# Patient Record
Sex: Female | Born: 1945
Health system: Southern US, Community
[De-identification: ages and names within clinical notes are randomized; demographics above are authoritative.]

## PROBLEM LIST (undated history)

## (undated) DIAGNOSIS — H547 Unspecified visual loss: Secondary | ICD-10-CM

## (undated) DIAGNOSIS — H409 Unspecified glaucoma: Secondary | ICD-10-CM

## (undated) DIAGNOSIS — M1990 Unspecified osteoarthritis, unspecified site: Secondary | ICD-10-CM

## (undated) DIAGNOSIS — F32A Depression, unspecified: Secondary | ICD-10-CM

## (undated) DIAGNOSIS — F319 Bipolar disorder, unspecified: Secondary | ICD-10-CM

## (undated) DIAGNOSIS — K219 Gastro-esophageal reflux disease without esophagitis: Secondary | ICD-10-CM

## (undated) DIAGNOSIS — F329 Major depressive disorder, single episode, unspecified: Secondary | ICD-10-CM

## (undated) DIAGNOSIS — G20 Parkinson's disease: Secondary | ICD-10-CM

## (undated) DIAGNOSIS — R413 Other amnesia: Secondary | ICD-10-CM

## (undated) DIAGNOSIS — S63659A Sprain of metacarpophalangeal joint of unspecified finger, initial encounter: Secondary | ICD-10-CM

## (undated) DIAGNOSIS — K449 Diaphragmatic hernia without obstruction or gangrene: Secondary | ICD-10-CM

## (undated) DIAGNOSIS — G20A1 Parkinson's disease without dyskinesia, without mention of fluctuations: Secondary | ICD-10-CM

## (undated) HISTORY — DX: Parkinson's disease: G20

## (undated) HISTORY — PX: TONSILLECTOMY: SUR1361

## (undated) HISTORY — DX: Parkinson's disease without dyskinesia, without mention of fluctuations: G20.A1

## (undated) HISTORY — PX: EYE SURGERY: SHX253

## (undated) HISTORY — PX: CYST REMOVAL NECK: SHX6281

## (undated) HISTORY — DX: Gastro-esophageal reflux disease without esophagitis: K21.9

## (undated) HISTORY — PX: ANKLE SURGERY: SHX546

## (undated) HISTORY — PX: FOOT NEUROMA SURGERY: SHX646

## (undated) HISTORY — DX: Unspecified glaucoma: H40.9

## (undated) HISTORY — DX: Diaphragmatic hernia without obstruction or gangrene: K44.9

## (undated) HISTORY — PX: CHOLECYSTECTOMY: SHX55

## (undated) HISTORY — DX: Unspecified visual loss: H54.7

---

## 1992-11-09 HISTORY — PX: CERVICAL DISC SURGERY: SHX588

## 2008-01-02 ENCOUNTER — Ambulatory Visit: Payer: Self-pay | Admitting: Family Medicine

## 2008-01-02 DIAGNOSIS — F329 Major depressive disorder, single episode, unspecified: Secondary | ICD-10-CM | POA: Insufficient documentation

## 2008-01-02 DIAGNOSIS — R51 Headache: Secondary | ICD-10-CM

## 2008-01-02 DIAGNOSIS — Z9189 Other specified personal risk factors, not elsewhere classified: Secondary | ICD-10-CM | POA: Insufficient documentation

## 2008-01-03 ENCOUNTER — Encounter: Payer: Self-pay | Admitting: Family Medicine

## 2008-08-03 ENCOUNTER — Telehealth: Payer: Self-pay | Admitting: Family Medicine

## 2008-08-19 ENCOUNTER — Emergency Department (HOSPITAL_COMMUNITY): Admission: EM | Admit: 2008-08-19 | Discharge: 2008-08-19 | Payer: Self-pay | Admitting: Emergency Medicine

## 2008-08-23 ENCOUNTER — Encounter: Payer: Self-pay | Admitting: Family Medicine

## 2008-09-06 ENCOUNTER — Encounter: Payer: Self-pay | Admitting: Family Medicine

## 2008-09-26 ENCOUNTER — Encounter: Payer: Self-pay | Admitting: Family Medicine

## 2008-11-19 ENCOUNTER — Ambulatory Visit: Payer: Self-pay | Admitting: Family Medicine

## 2008-11-19 DIAGNOSIS — R5381 Other malaise: Secondary | ICD-10-CM

## 2008-11-19 DIAGNOSIS — K219 Gastro-esophageal reflux disease without esophagitis: Secondary | ICD-10-CM

## 2008-11-19 DIAGNOSIS — R5383 Other fatigue: Secondary | ICD-10-CM

## 2008-11-20 ENCOUNTER — Encounter: Payer: Self-pay | Admitting: Family Medicine

## 2008-11-20 LAB — CONVERTED CEMR LAB
ALT: 23 units/L (ref 0–35)
AST: 22 units/L (ref 0–37)
Albumin: 3.8 g/dL (ref 3.5–5.2)
Alkaline Phosphatase: 65 units/L (ref 39–117)
BUN: 15 mg/dL (ref 6–23)
Basophils Absolute: 0 10*3/uL (ref 0.0–0.1)
Basophils Relative: 0.1 % (ref 0.0–3.0)
Bilirubin, Direct: 0.1 mg/dL (ref 0.0–0.3)
CO2: 34 meq/L — ABNORMAL HIGH (ref 19–32)
Calcium: 10.2 mg/dL (ref 8.4–10.5)
Chloride: 101 meq/L (ref 96–112)
Creatinine, Ser: 0.7 mg/dL (ref 0.4–1.2)
Eosinophils Absolute: 0.1 10*3/uL (ref 0.0–0.7)
Eosinophils Relative: 1.5 % (ref 0.0–5.0)
GFR calc Af Amer: 110 mL/min
GFR calc non Af Amer: 91 mL/min
Glucose, Bld: 108 mg/dL — ABNORMAL HIGH (ref 70–99)
HCT: 40.9 % (ref 36.0–46.0)
Hemoglobin: 13.9 g/dL (ref 12.0–15.0)
Lymphocytes Relative: 29.7 % (ref 12.0–46.0)
MCHC: 33.9 g/dL (ref 30.0–36.0)
MCV: 87.2 fL (ref 78.0–100.0)
Monocytes Absolute: 0.6 10*3/uL (ref 0.1–1.0)
Monocytes Relative: 6.4 % (ref 3.0–12.0)
Neutro Abs: 6 10*3/uL (ref 1.4–7.7)
Neutrophils Relative %: 62.3 % (ref 43.0–77.0)
Platelets: 314 10*3/uL (ref 150–400)
Potassium: 3.9 meq/L (ref 3.5–5.1)
RBC: 4.69 M/uL (ref 3.87–5.11)
RDW: 12.9 % (ref 11.5–14.6)
Sodium: 144 meq/L (ref 135–145)
TSH: 2.73 microintl units/mL (ref 0.35–5.50)
Total Bilirubin: 0.8 mg/dL (ref 0.3–1.2)
Total Protein: 7.1 g/dL (ref 6.0–8.3)
WBC: 9.5 10*3/uL (ref 4.5–10.5)

## 2008-12-10 ENCOUNTER — Telehealth: Payer: Self-pay | Admitting: Family Medicine

## 2008-12-28 ENCOUNTER — Encounter: Payer: Self-pay | Admitting: Family Medicine

## 2009-01-14 ENCOUNTER — Encounter: Admission: RE | Admit: 2009-01-14 | Discharge: 2009-03-14 | Payer: Self-pay | Admitting: Orthopedic Surgery

## 2009-06-25 ENCOUNTER — Ambulatory Visit: Payer: Self-pay | Admitting: Family Medicine

## 2009-06-25 DIAGNOSIS — R609 Edema, unspecified: Secondary | ICD-10-CM

## 2009-07-09 ENCOUNTER — Ambulatory Visit: Payer: Self-pay | Admitting: Family Medicine

## 2009-07-19 ENCOUNTER — Ambulatory Visit: Payer: Self-pay | Admitting: Family Medicine

## 2009-08-01 ENCOUNTER — Encounter: Payer: Self-pay | Admitting: Family Medicine

## 2009-08-15 ENCOUNTER — Encounter: Payer: Self-pay | Admitting: Family Medicine

## 2009-12-19 ENCOUNTER — Encounter: Payer: Self-pay | Admitting: Family Medicine

## 2010-03-04 ENCOUNTER — Telehealth: Payer: Self-pay | Admitting: Family Medicine

## 2010-06-05 ENCOUNTER — Emergency Department (HOSPITAL_COMMUNITY): Admission: EM | Admit: 2010-06-05 | Discharge: 2010-06-06 | Payer: Self-pay | Admitting: Emergency Medicine

## 2010-06-24 ENCOUNTER — Emergency Department (HOSPITAL_COMMUNITY): Admission: EM | Admit: 2010-06-24 | Discharge: 2010-06-24 | Payer: Self-pay | Admitting: Emergency Medicine

## 2010-06-26 ENCOUNTER — Telehealth: Payer: Self-pay | Admitting: Family Medicine

## 2010-07-04 ENCOUNTER — Observation Stay (HOSPITAL_COMMUNITY): Admission: RE | Admit: 2010-07-04 | Discharge: 2010-07-05 | Payer: Self-pay | Admitting: Orthopaedic Surgery

## 2010-07-04 HISTORY — PX: ORIF DISTAL RADIUS FRACTURE: SUR927

## 2010-07-31 ENCOUNTER — Ambulatory Visit: Payer: Self-pay | Admitting: Family Medicine

## 2010-07-31 DIAGNOSIS — R259 Unspecified abnormal involuntary movements: Secondary | ICD-10-CM | POA: Insufficient documentation

## 2010-07-31 DIAGNOSIS — B354 Tinea corporis: Secondary | ICD-10-CM | POA: Insufficient documentation

## 2010-07-31 DIAGNOSIS — L03119 Cellulitis of unspecified part of limb: Secondary | ICD-10-CM

## 2010-07-31 DIAGNOSIS — L02419 Cutaneous abscess of limb, unspecified: Secondary | ICD-10-CM | POA: Insufficient documentation

## 2010-09-08 ENCOUNTER — Inpatient Hospital Stay (HOSPITAL_COMMUNITY): Admission: EM | Admit: 2010-09-08 | Discharge: 2010-09-11 | Payer: Self-pay | Admitting: Emergency Medicine

## 2010-09-29 ENCOUNTER — Encounter: Payer: Self-pay | Admitting: Family Medicine

## 2010-10-25 ENCOUNTER — Encounter: Payer: Self-pay | Admitting: Family Medicine

## 2010-11-07 ENCOUNTER — Encounter: Payer: Self-pay | Admitting: Family Medicine

## 2010-11-13 ENCOUNTER — Ambulatory Visit
Admission: RE | Admit: 2010-11-13 | Discharge: 2010-11-13 | Payer: Self-pay | Source: Home / Self Care | Attending: Family Medicine | Admitting: Family Medicine

## 2010-11-13 DIAGNOSIS — G20 Parkinson's disease: Secondary | ICD-10-CM | POA: Insufficient documentation

## 2010-11-13 DIAGNOSIS — F068 Other specified mental disorders due to known physiological condition: Secondary | ICD-10-CM | POA: Insufficient documentation

## 2010-11-13 DIAGNOSIS — G20A1 Parkinson's disease without dyskinesia, without mention of fluctuations: Secondary | ICD-10-CM | POA: Insufficient documentation

## 2010-11-21 ENCOUNTER — Emergency Department (HOSPITAL_COMMUNITY)
Admission: EM | Admit: 2010-11-21 | Discharge: 2010-11-21 | Payer: Self-pay | Source: Home / Self Care | Admitting: Emergency Medicine

## 2010-11-24 LAB — CBC
HCT: 44.4 % (ref 36.0–46.0)
Hemoglobin: 14.9 g/dL (ref 12.0–15.0)
MCH: 30.3 pg (ref 26.0–34.0)
MCHC: 33.6 g/dL (ref 30.0–36.0)
MCV: 90.2 fL (ref 78.0–100.0)
Platelets: 257 10*3/uL (ref 150–400)
RBC: 4.92 MIL/uL (ref 3.87–5.11)
RDW: 13.2 % (ref 11.5–15.5)
WBC: 11.5 10*3/uL — ABNORMAL HIGH (ref 4.0–10.5)

## 2010-11-24 LAB — DIFFERENTIAL
Basophils Absolute: 0 10*3/uL (ref 0.0–0.1)
Basophils Relative: 0 % (ref 0–1)
Eosinophils Absolute: 0 10*3/uL (ref 0.0–0.7)
Eosinophils Relative: 0 % (ref 0–5)
Lymphocytes Relative: 9 % — ABNORMAL LOW (ref 12–46)
Lymphs Abs: 1 10*3/uL (ref 0.7–4.0)
Monocytes Absolute: 0.6 10*3/uL (ref 0.1–1.0)
Monocytes Relative: 5 % (ref 3–12)
Neutro Abs: 9.9 10*3/uL — ABNORMAL HIGH (ref 1.7–7.7)
Neutrophils Relative %: 86 % — ABNORMAL HIGH (ref 43–77)

## 2010-11-24 LAB — POCT I-STAT, CHEM 8
BUN: 8 mg/dL (ref 6–23)
Calcium, Ion: 1.1 mmol/L — ABNORMAL LOW (ref 1.12–1.32)
Chloride: 109 mEq/L (ref 96–112)
Creatinine, Ser: 0.7 mg/dL (ref 0.4–1.2)
Glucose, Bld: 121 mg/dL — ABNORMAL HIGH (ref 70–99)
HCT: 46 % (ref 36.0–46.0)
Hemoglobin: 15.6 g/dL — ABNORMAL HIGH (ref 12.0–15.0)
Potassium: 3.9 mEq/L (ref 3.5–5.1)
Sodium: 143 mEq/L (ref 135–145)
TCO2: 25 mmol/L (ref 0–100)

## 2010-11-25 ENCOUNTER — Encounter: Payer: Self-pay | Admitting: Family Medicine

## 2010-11-26 ENCOUNTER — Ambulatory Visit
Admission: RE | Admit: 2010-11-26 | Discharge: 2010-11-26 | Payer: Self-pay | Source: Home / Self Care | Attending: Family Medicine | Admitting: Family Medicine

## 2010-11-26 DIAGNOSIS — S32009A Unspecified fracture of unspecified lumbar vertebra, initial encounter for closed fracture: Secondary | ICD-10-CM | POA: Insufficient documentation

## 2010-12-09 NOTE — Assessment & Plan Note (Signed)
Summary: dizziness/lightheadiness//ccm   Vital Signs:  Patient profile:   65 year old female Temp:     97.8 degrees F BP sitting:   140 / 80  (left arm) Cuff size:   large  Vitals Entered By: Pura Spice, RN (July 31, 2010 11:06 AM) CC: states been complaining to Dr Tomasa Rand for tremors was told by him "due to your advanced age" . Fx right wrist aug 2011. tx by Dr Rayburn Ma.   Shingles bilaterally legs    Contraindications/Deferment of Procedures/Staging:    Test/Procedure: Weight Refused    Reason for deferment: patient declined-cannot calculate BMI   History of Present Illness: here with her daughter for 2 reasons. First she has had slowly worsening tremors and other involuntary movements of her arms and legs for the past year. Now it is so bad that she has trouble walking and loses her balance. She recently fell and broke a wrist because of this. She has been on Risperdal for the past year per Dr. Tomasa Rand, and they wonder if these could side effects. Dr. Tomasa Rand told her it was simply a part of her aging. Also, for the past 2 weeks she has had an itching rash on the insidesof both knees. No pain. It is red and blisters up from time to time.   Allergies: 1)  ! Penicillin 2)  ! * Celebrex  Past History:  Past Medical History: Reviewed history from 07/19/2009 and no changes required. Chickenpox Depression, sees Dr. Tomasa Rand Headache GERD osteoarthritis, sees Dr. Lestine Box peripheral edema  Past Surgical History: Tonsillectomy Caesarean section Lumpectomy from neck Disk fusion at C-6 in 1994 [facility] neuroma surgery to repair right wrist fracture 06-24-10 per Dr. Rayburn Ma  Review of Systems  The patient denies anorexia, fever, weight loss, weight gain, vision loss, decreased hearing, hoarseness, chest pain, syncope, dyspnea on exertion, peripheral edema, prolonged cough, headaches, hemoptysis, abdominal pain, melena, hematochezia, severe  indigestion/heartburn, hematuria, incontinence, genital sores, muscle weakness, suspicious skin lesions, transient blindness, depression, unusual weight change, abnormal bleeding, enlarged lymph nodes, angioedema, breast masses, and testicular masses.    Physical Exam  General:  Well-developed,well-nourished,in no acute distress; alert,appropriate and cooperative throughout examination Lungs:  Normal respiratory effort, chest expands symmetrically. Lungs are clear to auscultation, no crackles or wheezes. Heart:  Normal rate and regular rhythm. S1 and S2 normal without gallop, murmur, click, rub or other extra sounds. Neurologic:  alert & oriented X3 and cranial nerves II-XII intact.  Gait is unsteady. She has resting tremors of the arms and hands, and to some extent of the legs. She also has what appears to be some pill rolling movements of the hands and some constant hand and foot movements that are consistent with akisthesias.  Skin:  the medial sides of both thighs and knees have macular erythematous areas with some satellite red spots on the periphery. No papules or vessicles.    Impression & Recommendations:  Problem # 1:  RESTING TREMOR (ICD-781.0)  Orders: Neurology Referral (Neuro)  Problem # 2:  TINEA CORPORIS (ICD-110.5)  Problem # 3:  CELLULITIS, LEGS (ICD-682.6)  Her updated medication list for this problem includes:    Keflex 500 Mg Caps (Cephalexin) .Marland Kitchen... Three times a day  Complete Medication List: 1)  Wellbutrin Xl 300 Mg Xr24h-tab (Bupropion hcl) .... One daily 2)  Risperdal M-tab 2 Mg Tbdp (Risperidone) .... One daily 3)  Nexium 40 Mg Cpdr (Esomeprazole magnesium) .... Once daily 4)  Lamictal 150 Mg Tabs (Lamotrigine) .... 2 once daily  5)  Hydrochlorothiazide 25 Mg Tabs (Hydrochlorothiazide) .... Once daily 6)  Lunesta 2 Mg Tabs (Eszopiclone) 7)  Oxycodone Hcl 10 Mg Tabs (Oxycodone hcl) .... Dr blackmon 8)  Benztropine Mesylate 1 Mg Tabs (Benztropine mesylate) 9)   Keflex 500 Mg Caps (Cephalexin) .... Three times a day 10)  Ketoconazole 2 % Crea (Ketoconazole) .... Apply three times a day as needed rash  Patient Instructions: 1)  She has Candidiasis over the legs with a superimposed cellulitis. treat with antifungal cream and Keflex. Will refer her to Neurology for the arm and leg movements. These could be caused, at least in part, by her antipsychotic meds.  Prescriptions: KETOCONAZOLE 2 % CREA (KETOCONAZOLE) apply three times a day as needed rash  #60 x 5   Entered and Authorized by:   [name] MD   Signed by:   [name] MD on 07/31/2010   Method used:   Electronically to        [facility] # [phone]* (retail)       [address]       [place], [place]  [address]       Ph: [phone]       Fax: [phone]   RxID:   [id number] KEFLEX 500 MG CAPS (CEPHALEXIN) three times a day  #30 x 0   Entered and Authorized by:   [name] MD   Signed by:   [name] MD on 07/31/2010   Method used:   Electronically to        [facility] # [phone]* (retail)       [address]       [place], [place]  [address]       Ph: [phone]       Fax: [phone]   RxID:   [id number]

## 2010-12-09 NOTE — Letter (Signed)
Summary: Application for Handicapped Placard  Application for Handicapped Placard   Imported By: Maryln Gottron 08/05/2010 10:07:11  _____________________________________________________________________  External Attachment:    Type:   Image     Comment:   External Document

## 2010-12-09 NOTE — Consult Note (Signed)
Summary: [facility]  [facility]   Imported By: Maryln Gottron 10/01/2010 12:53:38  _____________________________________________________________________  External Attachment:    Type:   Image     Comment:   External Document

## 2010-12-09 NOTE — Progress Notes (Signed)
Summary: Pt req script for Nexium via [facility] and to [facility] Note Call from Patient Call back at [facility] Phone [phone]   Caller: Patient Summary of Call: Pt req refill of Nexium  90days with 3 refills through [facility] mail order pharmacy. Pt will pick up script for mail order.   Pt also req 30 day supply to [facility] on [address] [phone]. Initial call taken by: Lucy Antigua,  March 04, 2010 4:29 PM    Prescriptions: NEXIUM 40 MG CPDR (ESOMEPRAZOLE MAGNESIUM) once daily  #90 x 3   Entered by:   Raechel Ache, RN   Authorized by:   [name] MD   Signed by:   Raechel Ache, RN on 03/04/2010   Method used:   Print then Give to Patient   RxID:   [id number] NEXIUM 40 MG CPDR (ESOMEPRAZOLE MAGNESIUM) once daily  #30 x 1   Entered by:   Raechel Ache, RN   Authorized by:   [name] MD   Signed by:   Raechel Ache, RN on 03/04/2010   Method used:   Electronically to        [facility] # [phone]* (retail)       [address]       [place], [place]  [address]       Ph: [phone]       Fax: [phone]   RxID:   [id number]

## 2010-12-09 NOTE — Progress Notes (Signed)
Summary: REQ FOR RECOMMENDATION  Phone Note Call from Patient   Caller: Daughter Angelique Blonder)  [phone]  /  [phone] Summary of Call: Pts daughter called to pt fell and broke her wrist on 8/16..... Went to the ED and was evaluated, they referred her to [facility] but they will not see pt till outstanding balance is paid..... Pts daughter wants to see who Dr Clent Ridges would recommend for orthopedic consult for pt.....?  Initial call taken by: Debbra Riding,  June 26, 2010 12:14 PM  Follow-up for Phone Call        see [facility]. They should be able to see her today. We can do a referral if needed. Follow-up by: [name] MD,  June 27, 2010 8:44 AM  Additional Follow-up for Phone Call Additional follow up Details #1::        Called and spoke with pts daughter Angelique Blonder) to adv same.  Additional Follow-up by: Debbra Riding,  June 27, 2010 9:36 AM

## 2010-12-11 NOTE — Miscellaneous (Signed)
Summary: Certification and Plan of Care/[facility]  Certification and Plan of Care/[facility]   Imported By: Maryln Gottron 11/12/2010 13:07:54  _____________________________________________________________________  External Attachment:    Type:   Image     Comment:   External Document

## 2010-12-11 NOTE — Letter (Signed)
Summary: Records from [facility] 2011  Records from [facility] 2011   Imported By: Maryln Gottron 11/18/2010 09:13:15  _____________________________________________________________________  External Attachment:    Type:   Image     Comment:   External Document

## 2010-12-11 NOTE — Assessment & Plan Note (Signed)
Summary: er fup on fall//ccm pt rsc/njr   Vital Signs:  Patient profile:   65 year old female Temp:     98.6 degrees F Pulse rate:   74 / minute BP sitting:   114 / 76  (left arm) Cuff size:   regular  Vitals Entered By: Pura Spice, RN (November 26, 2010 11:08 AM) CC: follow-up visit ER [facility] for fall  . Daughter states she will not eat or drink and "clinches her teeth "  when trying to be fed.  Neurologist  Dr Vedia [facility]    History of Present Illness: Here to follow up after an ER visit on 11-21-10 for a fall which occurred at home. CT of head and CT of cervcial spines were normal. CT of lumbar spine revealed a small acute L1 compression fracture. Since then she has been in constant pain in the lower back. She is in a wheelchair and can no longer walk due to weakness, instabiity, and back pain. Her daughter relates that her memory is rapidly getting worse, she gets very confused, and she is now refusing to eat any food and is drinking only small amounts. Her tremors are worse. She had been having some home visits by PTs and OTs, but these were stopped when her insurance stopped paying for it. Now her daughter is at her wits end because she needs to return to work at her own job but Jaycelynn cannot be left alone at home.   Allergies: 1)  ! Penicillin 2)  ! * Celebrex  Past History:  Past Medical History: Reviewed history from 11/13/2010 and no changes required. Chickenpox Bipolar Depression, sees Dr. Tomasa Rand Headache GERD osteoarthritis, sees Dr. Lestine Box peripheral edema Parkinsonian tremors and dementia, sees Dr. Joycelyn Schmid  Past Surgical History: Reviewed history from 07/31/2010 and no changes required. Tonsillectomy Caesarean section Lumpectomy from neck Disk fusion at C-6 in 1994 [facility] neuroma surgery to repair right wrist fracture 06-24-10 per Dr. Rayburn Ma  Review of Systems  The patient denies anorexia, fever, weight loss, weight  gain, vision loss, decreased hearing, hoarseness, chest pain, syncope, dyspnea on exertion, peripheral edema, prolonged cough, headaches, hemoptysis, abdominal pain, melena, hematochezia, severe indigestion/heartburn, hematuria, incontinence, genital sores, muscle weakness, suspicious skin lesions, transient blindness, depression, unusual weight change, abnormal bleeding, enlarged lymph nodes, angioedema, breast masses, and testicular masses.    Physical Exam  General:  in a wheelchair, alert but disoriented Lungs:  Normal respiratory effort, chest expands symmetrically. Lungs are clear to auscultation, no crackles or wheezes. Heart:  Normal rate and regular rhythm. S1 and S2 normal without gallop, murmur, click, rub or other extra sounds. Neurologic:  cranial nerves II-XII intact.  she has expressive aphasia and has great difficulty putting her thoughts into words. Resting tremors in the arms, legs, and head   Impression & Recommendations:  Problem # 1:  COMPRESSION FRACTURE, LUMBAR VERTEBRAE (ICD-805.4)  Problem # 2:  DEMENTIA (ICD-294.8)  Problem # 3:  PARKINSONISM (ICD-332.0)  Problem # 4:  WEAKNESS (ICD-780.79)  Complete Medication List: 1)  Wellbutrin Xl 300 Mg Xr24h-tab (Bupropion hcl) .... One daily 2)  Nexium 40 Mg Cpdr (Esomeprazole magnesium) .... Once daily 3)  Benztropine Mesylate 2 Mg Tabs (benztropine Mesylate)  .... Three times a day 4)  Lamotrigine 200 Mg Tabs (Lamotrigine) .... Once daily 5)  Aricept 10 Mg Tabs (Donepezil hcl) .... Once daily 6)  Depakote 500 Mg Tbec (Divalproex sodium) .... 1000mg  once daily  Patient Instructions: 1)  she  obviously needs inpatient care now, but her insurance is limiting what is available here in [name]. I have asked her daughter to drive her to [facility] ER for probable admission.   Orders Added: 1)  Est. Patient Level V [[address]]

## 2010-12-11 NOTE — Miscellaneous (Signed)
Summary: Certification and Plan of Care-Ammended/[facility]  Certification and Plan of Care-Ammended/[facility]   Imported By: Maryln Gottron 11/24/2010 12:34:03  _____________________________________________________________________  External Attachment:    Type:   Image     Comment:   External Document

## 2010-12-11 NOTE — Assessment & Plan Note (Signed)
Summary: POST REHAB F/U // RS   Vital Signs:  Patient profile:   65 year old female Height:      62 inches Weight:      174 pounds BMI:     31.94 Pulse rate:   70 / minute BP sitting:   118 / 86  (left arm)  Vitals Entered By: Jeremy Johann CMA (November 13, 2010 8:34 AM) CC: F/U rehab, refills, fasting   History of Present Illness: Here to follow up after a rehab stay at [facility] from 09-12-10 to 10-25-10 to receive PT and OT. Now she is back at home, and is having Home Health nursing visits twice a week. She is walking with a cane or walker, but she is no longer driving. She is due to see Dr. Marjory Lies sometime in the next few weeks , and she is to see Dr. Tomasa Rand again on 11-18-10. She is on Benztropine three times a day and Denepezil at bedtime for the tremors, and she is on Aricept 5 mg a day for dementia. She was felt to be having Parkinsonian symptoms like tremors, gait instability, and memory difficulties, and these are at least partly the result of taking long term antipsychotic meds. She says the tremors and memory issues are getting worse all the time. Here with her health aide.   Allergies (verified): 1)  ! Penicillin 2)  ! * Celebrex  Past History:  Past Medical History: Chickenpox Bipolar Depression, sees Dr. Tomasa Rand Headache GERD osteoarthritis, sees Dr. Lestine Box peripheral edema Parkinsonian tremors and dementia, sees Dr. Joycelyn Schmid  Past Surgical History: Reviewed history from 07/31/2010 and no changes required. Tonsillectomy Caesarean section Lumpectomy from neck Disk fusion at C-6 in 1994 [facility] neuroma surgery to repair right wrist fracture 06-24-10 per Dr. Rayburn Ma  Review of Systems  The patient denies anorexia, fever, weight loss, weight gain, vision loss, decreased hearing, hoarseness, chest pain, syncope, dyspnea on exertion, peripheral edema, prolonged cough, headaches, hemoptysis, abdominal pain, melena, hematochezia,  severe indigestion/heartburn, hematuria, incontinence, genital sores, muscle weakness, suspicious skin lesions, transient blindness, unusual weight change, abnormal bleeding, enlarged lymph nodes, angioedema, breast masses, and testicular masses.    Physical Exam  General:  alert, very unsteady gait, walks slowly with a shuffling gait, using a cane  Neck:  No deformities, masses, or tenderness noted. Lungs:  Normal respiratory effort, chest expands symmetrically. Lungs are clear to auscultation, no crackles or wheezes. Heart:  Normal rate and regular rhythm. S1 and S2 normal without gallop, murmur, click, rub or other extra sounds. Neurologic:  alert & oriented X3.  very pronounced resting tremors in the head, hands, arms, and legs. Her speech is halting, and she has trouble finding the right words to express herself. Often stops and forgets what she was talking about.  Psych:  Oriented X3, good eye contact, and moderately anxious.     Impression & Recommendations:  Problem # 1:  PARKINSONISM (ICD-332.0)  Problem # 2:  DEMENTIA (ICD-294.8)  Problem # 3:  DEPRESSION (ICD-311)  Her updated medication list for this problem includes:    Wellbutrin Xl 300 Mg Xr24h-tab (Bupropion hcl) ..... One daily  Complete Medication List: 1)  Wellbutrin Xl 300 Mg Xr24h-tab (Bupropion hcl) .... One daily 2)  Risperdal 1 Mg Tabs (Risperidone) .Marland Kitchen.. 1 tab at bedtime 3)  Nexium 40 Mg Cpdr (Esomeprazole magnesium) .... Once daily 4)  Benztropine Mesylate 1 Mg Tabs (Benztropine mesylate) .... Three times a day 5)  Donepezil Hcl 5 Mg  Tabs (Donepezil hcl) .Marland Kitchen.. 1 tab at bedtime 6)  Lamotrigine 200 Mg Tabs (Lamotrigine) .... 2 once daily 7)  Aricept 10 Mg Tabs (Donepezil hcl) .... Once daily  Patient Instructions: 1)  She has worsening Parkinsonian symptoms, and I think she could greatly benefit from taking Sinemet. Her demetia is probably Parkinsonian in nature, as well. I doubt the Aricept is doing much good,  but I will keep her on it for now, and we will increase the dose to 10 mg a day. Her symptoms are probably worsened by meds like the Risperdol she is on, but we will let her psychiatrist decide about whether or no to stop this. See Dr. Casimer Lanius and Dr. Tomasa Rand soon, and see me in one month.  Prescriptions: ARICEPT 10 MG TABS (DONEPEZIL HCL) once daily  #30 x 11   Entered and Authorized by:   [name] MD   Signed by:   [name] MD on 11/13/2010   Method used:   Electronically to        [facility]  [phone]* (retail)       [address] [place], [place]  [address]       Ph: [phone] or [phone]       Fax: [phone]   RxID:   [id number]    Orders Added: 1)  Est. Patient Level IV [36644]

## 2010-12-17 NOTE — Miscellaneous (Signed)
Summary: Medication Interactions/[facility]  Medication Interactions/[facility]   Imported By: Maryln Gottron 12/09/2010 14:52:09  _____________________________________________________________________  External Attachment:    Type:   Image     Comment:   External Document

## 2010-12-24 DIAGNOSIS — G20A1 Parkinson's disease without dyskinesia, without mention of fluctuations: Secondary | ICD-10-CM

## 2010-12-24 DIAGNOSIS — G20 Parkinson's disease: Secondary | ICD-10-CM

## 2010-12-24 DIAGNOSIS — R413 Other amnesia: Secondary | ICD-10-CM

## 2010-12-24 DIAGNOSIS — F319 Bipolar disorder, unspecified: Secondary | ICD-10-CM

## 2010-12-25 ENCOUNTER — Encounter: Payer: Self-pay | Admitting: Family Medicine

## 2010-12-26 ENCOUNTER — Ambulatory Visit (INDEPENDENT_AMBULATORY_CARE_PROVIDER_SITE_OTHER): Admitting: Family Medicine

## 2010-12-26 ENCOUNTER — Encounter: Payer: Self-pay | Admitting: Family Medicine

## 2010-12-26 VITALS — BP 130/80 | Temp 97.5°F | Wt 163.0 lb

## 2010-12-26 DIAGNOSIS — F329 Major depressive disorder, single episode, unspecified: Secondary | ICD-10-CM

## 2010-12-26 DIAGNOSIS — M810 Age-related osteoporosis without current pathological fracture: Secondary | ICD-10-CM

## 2010-12-26 DIAGNOSIS — F3289 Other specified depressive episodes: Secondary | ICD-10-CM

## 2010-12-26 DIAGNOSIS — G20 Parkinson's disease: Secondary | ICD-10-CM

## 2010-12-26 DIAGNOSIS — G20A1 Parkinson's disease without dyskinesia, without mention of fluctuations: Secondary | ICD-10-CM

## 2010-12-26 LAB — BASIC METABOLIC PANEL
BUN: 7 mg/dL (ref 6–23)
CO2: 26 mEq/L (ref 19–32)
Chloride: 107 mEq/L (ref 96–112)
Potassium: 4.1 mEq/L (ref 3.5–5.1)

## 2010-12-26 LAB — CBC WITH DIFFERENTIAL/PLATELET
Basophils Absolute: 0 10*3/uL (ref 0.0–0.1)
HCT: 40.9 % (ref 36.0–46.0)
Lymphs Abs: 2.9 10*3/uL (ref 0.7–4.0)
MCV: 90.8 fl (ref 78.0–100.0)
Monocytes Absolute: 0.4 10*3/uL (ref 0.1–1.0)
Neutrophils Relative %: 48 % (ref 43.0–77.0)
Platelets: 239 10*3/uL (ref 150.0–400.0)
RDW: 14.7 % — ABNORMAL HIGH (ref 11.5–14.6)

## 2010-12-26 LAB — HEPATIC FUNCTION PANEL
ALT: 24 U/L (ref 0–35)
AST: 18 U/L (ref 0–37)
Bilirubin, Direct: 0.2 mg/dL (ref 0.0–0.3)
Total Bilirubin: 1 mg/dL (ref 0.3–1.2)
Total Protein: 6.5 g/dL (ref 6.0–8.3)

## 2010-12-26 LAB — TSH: TSH: 2.32 u[IU]/mL (ref 0.35–5.50)

## 2010-12-26 LAB — VITAMIN B12: Vitamin B-12: 432 pg/mL (ref 211–911)

## 2010-12-26 NOTE — Progress Notes (Signed)
  Subjective:    Patient ID: Audrey Brown, female    DOB: [birth year], 65 y.o.   MRN: [id number]  HPI Here with her daughter Audrey Brown to follow up from a stay at [facility] from 11-20-10 to 12-04-10 for a lumbar compression fracture due to a fall, for Parkinsons disease, and for dementia. All her previous psychiatric meds from Dr. Tomasa Rand were stopped since they were contributing to her parkinsonian symptoms. She was treated at [facility] by a Dr. Zola Button, who is a Neurologist specializing in movement disorders. Was was started on Sinemet and Cogentin, and she has dramatically improved as far as her mental status, her memory, and her tremors. After her DC from [facility], she was transferred to [facility] for rehabilitation. She is set to return home from there in the next few weeks. She is seeing Dr. Marjory Lies now for Neurology care locally.    Review of Systems  Respiratory: Negative.   Cardiovascular: Negative.   Musculoskeletal: Positive for gait problem.  Neurological: Positive for tremors and weakness.  Psychiatric/Behavioral: Negative.        Objective:   Physical Exam  Constitutional: She is oriented to person, place, and time.       Seated in her wheelchair, alert  Cardiovascular: Normal rate, regular rhythm, normal heart sounds and intact distal pulses.  Exam reveals no gallop and no friction rub.   No murmur heard. Pulmonary/Chest: Effort normal and breath sounds normal. No respiratory distress. She has no wheezes. She has no rales. She exhibits no tenderness.  Neurological: She is alert and oriented to person, place, and time.       She has resting tremors of both hands and forearms, but these are much improved from her last visit here. No head or  truncal tremors          Assessment & Plan:  She is improving nicely after her medication changes. She is getting OT and PT at rehab.

## 2010-12-29 ENCOUNTER — Telehealth: Payer: Self-pay

## 2010-12-29 MED ORDER — VITAMIN D (ERGOCALCIFEROL) 1.25 MG (50000 UNIT) PO CAPS: 50000.0000 [IU] | ORAL_CAPSULE | ORAL | Status: DC

## 2010-12-29 NOTE — Telephone Encounter (Signed)
Message copied by Madison Hickman on Mon Dec 29, 2010 11:22 AM ------      Message from: Dwaine Deter      Created: Mon Dec 29, 2010  9:06 AM       Vitamin D is very low. Call in 50,000 unit capsules to take once a week for 12 weeks. Then check another level

## 2010-12-29 NOTE — Telephone Encounter (Signed)
Audrey Brown daughter notified.  Lab results

## 2010-12-29 NOTE — Telephone Encounter (Signed)
Daughter  Angelique Blonder aware and rx vit d called in

## 2010-12-29 NOTE — Telephone Encounter (Signed)
Message copied by Madison Hickman on Mon Dec 29, 2010 11:21 AM ------      Message from: Dwaine Deter      Created: Fri Dec 26, 2010  4:40 PM       Normal, although vitamin D is pending

## 2011-01-03 ENCOUNTER — Emergency Department (HOSPITAL_COMMUNITY)

## 2011-01-03 ENCOUNTER — Inpatient Hospital Stay (HOSPITAL_COMMUNITY)
Admission: EM | Admit: 2011-01-03 | Discharge: 2011-01-06 | DRG: 103 | Disposition: A | Discharge status: Home Health | Attending: Internal Medicine | Admitting: Internal Medicine

## 2011-01-03 DIAGNOSIS — Y849 Medical procedure, unspecified as the cause of abnormal reaction of the patient, or of later complication, without mention of misadventure at the time of the procedure: Secondary | ICD-10-CM | POA: Diagnosis present

## 2011-01-03 DIAGNOSIS — F0390 Unspecified dementia without behavioral disturbance: Secondary | ICD-10-CM | POA: Diagnosis present

## 2011-01-03 DIAGNOSIS — Y9301 Activity, walking, marching and hiking: Secondary | ICD-10-CM

## 2011-01-03 DIAGNOSIS — Z9181 History of falling: Secondary | ICD-10-CM

## 2011-01-03 DIAGNOSIS — F319 Bipolar disorder, unspecified: Secondary | ICD-10-CM | POA: Diagnosis present

## 2011-01-03 DIAGNOSIS — S32009A Unspecified fracture of unspecified lumbar vertebra, initial encounter for closed fracture: Secondary | ICD-10-CM | POA: Diagnosis present

## 2011-01-03 DIAGNOSIS — R112 Nausea with vomiting, unspecified: Secondary | ICD-10-CM | POA: Diagnosis present

## 2011-01-03 DIAGNOSIS — J111 Influenza due to unidentified influenza virus with other respiratory manifestations: Secondary | ICD-10-CM | POA: Diagnosis present

## 2011-01-03 DIAGNOSIS — G20A1 Parkinson's disease without dyskinesia, without mention of fluctuations: Secondary | ICD-10-CM | POA: Diagnosis present

## 2011-01-03 DIAGNOSIS — W010XXA Fall on same level from slipping, tripping and stumbling without subsequent striking against object, initial encounter: Secondary | ICD-10-CM | POA: Diagnosis present

## 2011-01-03 DIAGNOSIS — G20 Parkinson's disease: Secondary | ICD-10-CM | POA: Diagnosis present

## 2011-01-03 DIAGNOSIS — G971 Other reaction to spinal and lumbar puncture: Principal | ICD-10-CM | POA: Diagnosis present

## 2011-01-03 LAB — CBC
Hemoglobin: 14.2 g/dL (ref 12.0–15.0)
MCH: 30.9 pg (ref 26.0–34.0)
MCV: 90.6 fL (ref 78.0–100.0)
Platelets: 262 10*3/uL (ref 150–400)
RBC: 4.59 MIL/uL (ref 3.87–5.11)
WBC: 19.4 10*3/uL — ABNORMAL HIGH (ref 4.0–10.5)

## 2011-01-03 LAB — COMPREHENSIVE METABOLIC PANEL
ALT: 19 U/L (ref 0–35)
AST: 20 U/L (ref 0–37)
Calcium: 9.2 mg/dL (ref 8.4–10.5)
GFR calc Af Amer: >60 mL/min (ref >60)
Sodium: 139 mEq/L (ref 135–145)
Total Protein: 7 g/dL (ref 6.0–8.3)

## 2011-01-03 LAB — DIFFERENTIAL
Eosinophils Absolute: 0 10*3/uL (ref 0.0–0.7)
Lymphocytes Relative: 11 % — ABNORMAL LOW (ref 12–46)
Lymphs Abs: 2.1 10*3/uL (ref 0.7–4.0)
Monocytes Relative: 7 % (ref 3–12)
Neutro Abs: 16 10*3/uL — ABNORMAL HIGH (ref 1.7–7.7)
Neutrophils Relative %: 82 % — ABNORMAL HIGH (ref 43–77)

## 2011-01-03 LAB — URINALYSIS, ROUTINE W REFLEX MICROSCOPIC
Bilirubin Urine: NEGATIVE
Hgb urine dipstick: NEGATIVE
Urobilinogen, UA: 0.2 mg/dL (ref 0.0–1.0)

## 2011-01-03 LAB — URINE MICROSCOPIC-ADD ON

## 2011-01-03 MED ORDER — GADOBENATE DIMEGLUMINE 529 MG/ML IV SOLN
15.0000 mL | Freq: Once | INTRAVENOUS | Status: AC | PRN
  Administered 2011-01-03: 15 mL via INTRAVENOUS

## 2011-01-04 LAB — DIFFERENTIAL
Eosinophils Absolute: 0.1 10*3/uL (ref 0.0–0.7)
Eosinophils Relative: 1 % (ref 0–5)
Lymphs Abs: 2.2 10*3/uL (ref 0.7–4.0)
Monocytes Relative: 8 % (ref 3–12)
Neutrophils Relative %: 78 % — ABNORMAL HIGH (ref 43–77)

## 2011-01-04 LAB — CBC
HCT: 37.9 % (ref 36.0–46.0)
MCH: 30.1 pg (ref 26.0–34.0)
MCV: 91.3 fL (ref 78.0–100.0)
Platelets: 237 10*3/uL (ref 150–400)
RBC: 4.15 MIL/uL (ref 3.87–5.11)

## 2011-01-04 LAB — URINE CULTURE: Colony Count: >=100000

## 2011-01-04 NOTE — H&P (Signed)
Audrey Brown, Audrey Brown              ACCOUNT NO.:  [id number]  MEDICAL RECORD NO.:  [id number]           PATIENT TYPE:  I  LOCATION:  1504                         FACILITY:  [facility]  PHYSICIAN:  Mariea Stable, MD   DATE OF BIRTH:  September 22, 1946  DATE OF ADMISSION:  01/03/2011 DATE OF DISCHARGE:                             HISTORY & PHYSICAL   PRIMARY CARE PHYSICIAN:  Tera Mater. Clent Ridges, MD.  PSYCHIATRIST:  Tiajuana Amass, MD  The patient is following at [facility] as well with Neurology.  CHIEF COMPLAINT:  Nausea with vomiting and headache for a few days.  HISTORY OF PRESENT ILLNESS:  Audrey Brown is a 65 year old woman with past medical history significant for Parkinson disease, dementia, bipolar disorder and recurrent falls who presents with chief complaint stated above.  The patient had apparently been evaluated at [facility] for these recurrent falls and Parkinson disease thought to be secondary to medication.  She apparently had a spinal tap done on Wednesday, December 31, 2010, for further evaluation of this. Apparently on Thursday, the patient began having a constellation of symptoms that included mild headache, some nausea and vomiting along with a sore throat that has been getting progressively worse and some diffuse myalgias.  Apparently these have all been worse over the last few days.  The daughter who is at bedside states that the patient was not able to keep anything down on Thursday and then got a little better Friday tolerating a bit of p.o. intake.  However, today, the patient is back to not tolerating anything p.o.  She also apparently was noted to have a fever of 102 by the home health nurse today.  Currently, the patient complains of frontal headache without any neck stiffness or photophobia at this time.  Emesis has consisted of ingested food products and no bilious or bloody contents noted.  Furthermore, the patient denies any dysuria or frequency, change in  color or odor.  She also denies any chest pain or cough.  Furthermore, she denies any diarrhea.  The patient also denies any back pain at the site of the lumbar puncture.  PAST MEDICAL HISTORY: 1. Osteoarthritis. 2. GERD. 3. Dementia. 4. Parkinson disease. 5. Bipolar. 6. Recurrent falls.  MEDICATIONS: 1. Sinemet 25/100 mg p.o. t.i.d. 2. Flexeril 5 mg p.o. t.i.d. p.r.n. pain. 3. Bupropion 300 mg p.o. daily. 4. Aricept 10 mg p.o. daily. 5. Nexium 40 mg p.o. daily. 6. Vicodin p.o. every 6 hours p.r.n. pain. 7. Lamictal 200 mg p.o. daily. 8. Potassium chloride 10 mEq p.o. daily.  ALLERGIES:  PENICILLIN, which causes vomiting and CELEBREX, which causes hives.  SOCIAL HISTORY:  The patient lives with her daughter currently.  She is a nonsmoker, nondrinker and no illicit drug use.  Daughter, phone number [phone] is healthcare power of attorney.  REVIEW OF SYSTEMS:  As per HPI.  All other systems reviewed are negative.  FAMILY HISTORY:  Without any significant medical problems reported.  PHYSICAL EXAMINATION:  VITAL SIGNS:  Temperature 101.2, currently 98.7, heart rate of 104, currently 92, blood pressure 135/69 and stable, respirations 21 and oxygen saturation 96% on room air. GENERAL:  This is an elderly woman lying in bed who appears uncomfortable but in no acute distress. HEENT:  Head is normocephalic and atraumatic.  Pupils are equally round and reactive to light.  Extraocular movements are intact.  Sclerae anicteric.  Mucous membranes are moist without any oropharyngeal lesions. NECK:  Supple.  There is some mild tenderness in the anterior neck to palpation.  There is no thyromegaly.  There are no carotid bruits. There is no JVD. LUNGS:  Clear to auscultation bilaterally with good air movement. HEART:  There is normal S1 and S2 with regular rate and rhythm.  No murmurs, gallops or rubs. ABDOMEN:  Normal bowel sounds, soft, nontender, nondistended.  No guarding or  rebound. EXTREMITIES:  There is no edema. NEUROLOGIC:  The patient is awake, alert and oriented x3.  Cranial nerves appear grossly intact.  Motor is intact and sensation is intact. MUSCULOSKELETAL:  The patient is able to touch chin to chest.  There is mild tenderness upon palpation over the spine that is nonfocal.  There is a small bruised area at the site of the lumbar puncture, but there is no erythema or drainage from this site.  LABORATORY DATA:  A urinalysis shows a small amount of leukocyte esterase with 0-2 white blood cells on microscopy, WBC 19.4, hemoglobin 14.2, platelets 262 with an ANC of 16.  Sodium 139, potassium 3.9, chloride 104, bicarb 26, glucose 117, BUN 6, creatinine 0.66, total bilirubin 1.2, alkaline phosphatase 117, AST 20, ALT 19, total protein 7, albumin 3.7, calcium 9.2, rapid strep negative.  IMAGING: 1. Chest x-ray.  Impression:  No active cardiopulmonary disease. 2. MRI of L-spine with and without contrast.  Impression:  Acute or     subacute superior endplate compression fracture at L1.  No neural     compression.  This appears to be a benign type fracture.  Since the     previous CT scan, there may be slight/minimal further collapse at     the L1 superior endplate.  No complications seen relative to recent     lumbar puncture.  ASSESSMENT/PLAN: 1. Headache, nausea, vomiting, and sore throat.  This is currently of     unclear etiology, but given the constellation of symptoms, presume     that this is viral etiology and potentially flu.  We will go ahead     and order influenza PCR and start empiric treatment with Tamiflu.     The patient does have significant leukocytosis that is     predominately composed of neutrophils at 82% with an ANC of 16.     However, chest x-ray does not demonstrate any infiltrates and the     patient does not have any respiratory complaints per se aside from     the sore throat.  Furthermore, she has no urinary complaints  and     has a clean urinalysis and does not have CVA tenderness on exam.     Furthermore, the patient has nausea and vomiting, but she has got     no abdominal pain and abdominal exam is normal.  Furthermore, her     electrolytes are all within normal limits as are her liver function     tests.  Again, with no abdominal pain whatsoever and no tenderness     on exam, we will not pursue checking a lipase as this clinically is     not pancreatitis.  Furthermore, I do not see any infectious     etiology in  the abdomen as a likely etiology.  Again, I think the     most likely thing here is a viral syndrome, which may potentially     include fluid and the patient had her immunizations last fall.     Furthermore, the patient did have the lumbar puncture, however,     there are no significant findings on exam at the site of the lumbar     puncture and MRI obtained in the emergency department demonstrates     that there are no complications such as an epidural abscess.     Furthermore, this does not appear to be a bacterial meningitis with     symptoms now ongoing for day #3.  The patient is also not toxic     appearing, which would make this highly unlikely.  If there were     any component to meningitis, this would be an aseptic meningitis    that would not require treatment at this point.  Furthermore, the     patient has no meningismus on exam.  As mentioned above, we will go     ahead and treat empirically for flu and check a PCR.  We will also     hydrate and provide antiemetics.  We will start with a clear liquid     diet and advance as tolerated. 2. Superior endplate compression fracture at L1.  Per records from     [facility], this was secondary to a fall that the patient had     a few weeks ago.  This was noted by the physicians and the patient     had a neurosurgical consultation, although the patient was not     deemed to be a candidate.  Furthermore, they recommended possible      vertebroplasty for pain management, although the patient was not     having significant pain by that time and this was deferred.  At     this point, the patient should probably get a bone densitometry     scan to evaluate for osteoporosis as an outpatient if this has not     been done. 3. Parkinson's.  Again, this was thought to be secondary to     medication, which have been held without improvement per records.     We will continue her current regimen. 4. Bipolar disorder.  We will continue the patient's Lamictal and     bupropion.  Of note, her Depakote has been discontinued in the     recent past. 5. Dementia.  Again, this may be related to Parkinson's versus another     etiology.  I will go ahead and continue the patient's Aricept at     home dose.     Mariea Stable, MD     MA/MEDQ  D:  01/03/2011  T:  01/03/2011  Job:  [id number]  cc:   Jeannett Senior A. Clent Ridges, MD [address] [place] [address]  Tiajuana Amass, MD  Electronically Signed by Mariea Stable MD on 01/04/2011 05:50:24 PM

## 2011-01-05 LAB — BASIC METABOLIC PANEL
CO2: 27 mEq/L (ref 19–32)
Chloride: 107 mEq/L (ref 96–112)
GFR calc Af Amer: >60 mL/min (ref >60)
Potassium: 3.2 mEq/L — ABNORMAL LOW (ref 3.5–5.1)
Sodium: 141 mEq/L (ref 135–145)

## 2011-01-05 LAB — CBC
Hemoglobin: 12.6 g/dL (ref 12.0–15.0)
MCH: 29.7 pg (ref 26.0–34.0)
Platelets: 249 10*3/uL (ref 150–400)
RBC: 4.24 MIL/uL (ref 3.87–5.11)
WBC: 12.1 10*3/uL — ABNORMAL HIGH (ref 4.0–10.5)

## 2011-01-05 LAB — DIFFERENTIAL
Basophils Absolute: 0 10*3/uL (ref 0.0–0.1)
Basophils Relative: 0 % (ref 0–1)
Monocytes Relative: 9 % (ref 3–12)
Neutro Abs: 7.7 10*3/uL (ref 1.7–7.7)
Neutrophils Relative %: 64 % (ref 43–77)

## 2011-01-06 LAB — DIFFERENTIAL
Lymphocytes Relative: 39 % (ref 12–46)
Monocytes Absolute: 0.7 10*3/uL (ref 0.1–1.0)
Monocytes Relative: 10 % (ref 3–12)
Neutro Abs: 3.5 10*3/uL (ref 1.7–7.7)
Neutrophils Relative %: 48 % (ref 43–77)

## 2011-01-06 LAB — BASIC METABOLIC PANEL
CO2: 26 mEq/L (ref 19–32)
Calcium: 9 mg/dL (ref 8.4–10.5)
Chloride: 109 mEq/L (ref 96–112)
Creatinine, Ser: 0.51 mg/dL (ref 0.4–1.2)
Glucose, Bld: 93 mg/dL (ref 70–99)
Sodium: 141 mEq/L (ref 135–145)

## 2011-01-06 LAB — CBC
HCT: 38.8 % (ref 36.0–46.0)
Hemoglobin: 12.9 g/dL (ref 12.0–15.0)
MCH: 29.9 pg (ref 26.0–34.0)
MCHC: 33.2 g/dL (ref 30.0–36.0)
RBC: 4.32 MIL/uL (ref 3.87–5.11)

## 2011-01-09 LAB — CULTURE, BLOOD (ROUTINE X 2)
Culture  Setup Time: 201202252022
Culture: NO GROWTH

## 2011-01-14 ENCOUNTER — Encounter: Payer: Self-pay | Admitting: Family Medicine

## 2011-01-14 ENCOUNTER — Ambulatory Visit (INDEPENDENT_AMBULATORY_CARE_PROVIDER_SITE_OTHER): Admitting: Family Medicine

## 2011-01-14 DIAGNOSIS — F319 Bipolar disorder, unspecified: Secondary | ICD-10-CM

## 2011-01-14 DIAGNOSIS — G20A1 Parkinson's disease without dyskinesia, without mention of fluctuations: Secondary | ICD-10-CM

## 2011-01-14 DIAGNOSIS — G20 Parkinson's disease: Secondary | ICD-10-CM

## 2011-01-14 MED ORDER — ESOMEPRAZOLE MAGNESIUM 40 MG PO CPDR: 40.0000 mg | DELAYED_RELEASE_CAPSULE | Freq: Every day | ORAL | Status: DC

## 2011-01-14 MED ORDER — BUPROPION HCL ER (XL) 300 MG PO TB24: 300.0000 mg | ORAL_TABLET | Freq: Every day | ORAL | Status: DC

## 2011-01-14 MED ORDER — IBUPROFEN 800 MG PO TABS: 800.0000 mg | ORAL_TABLET | Freq: Three times a day (TID) | ORAL | Status: AC | PRN

## 2011-01-14 MED ORDER — LAMOTRIGINE ER 200 MG PO TB24: 1.0000 | ORAL_TABLET | Freq: Every day | ORAL | Status: DC

## 2011-01-14 NOTE — Progress Notes (Signed)
  Subjective:    Patient ID: Audrey Brown, female    DOB: 04-24-1946, 65 y.o.   MRN: [id number]  HPI Here to follow up after a hospital stay from 01-03-11 to 01-06-11 at [facility] for nausea, vomiting, and a HA which was probably the result of a lumbar puncture she had undergone a few days prior to this admission. She did have a fever but no cough, and there is a possibility that she had influenza. At any rate she improved quickly and was able to go home. Now she is to start PT and OT at home. These were ordered by Dr. Newt Lukes (a primary care doctor in the [place] system). She will be taking over her rehab treatments. Now [facility] is considering moving into an assisted living facility, possibly within the [place] system. At her last visit with Dr. Zola Button, he concluded that she does not have dementia, only Parkinson's, and he felt the Aricept could possibly be stopped.     Review of Systems  Eyes: Negative.   Respiratory: Negative.   Neurological: Positive for tremors and weakness. Negative for headaches.  Psychiatric/Behavioral: The patient is nervous/anxious.        Objective:   Physical Exam  Constitutional: She is oriented to person, place, and time.       Alert, in her wheelchair   Cardiovascular: Normal rate, regular rhythm, normal heart sounds and intact distal pulses.   Pulmonary/Chest: Effort normal and breath sounds normal.  Neurological: She is alert and oriented to person, place, and time.  Psychiatric: She has a normal mood and affect. Her behavior is normal. Judgment and thought content normal.          Assessment & Plan:  Since we all agree she does not have dementia, I will stop the Aricept. Refilled her other psychiatric meds. She will follow up with Dr. Zola Button at [facility] for the Parkinson's disease. [facility]

## 2011-01-15 NOTE — Discharge Summary (Signed)
Audrey Brown, Audrey Brown              ACCOUNT NO.:  [id number]  MEDICAL RECORD NO.:  [id number]           PATIENT TYPE:  I  LOCATION:  1504                         FACILITY:  [facility]  PHYSICIAN:  Altha Harm, MDDATE OF BIRTH:  [birth year]  DATE OF ADMISSION:  01/03/2011 DATE OF DISCHARGE:  01/06/2011                              DISCHARGE SUMMARY   DISCHARGE DISPOSITION:  Home with home health services.  FINAL DISCHARGE DIAGNOSES: 1. Post lumbar puncture headache. 2. Suspect influenza, however, patient was refused testing, so never     confirmed 3. Dementia. 4. Parkinson's disorder. 5. Bipolar disorder. 6. Recurrent falls.  DISCHARGE MEDICATIONS:  Include the following; 1. Zofran 4 mg p.o. q.6h. as needed for nausea. 2. Advil 200 mg p.o. q.8h. p.r.n. pain. 3. Aricept 10 mg p.o. daily. 4. Bupropion XL 300 mg p.o. daily. 5. Flexeril 5 mg p.o. q.8h. p.r.n. muscle spasms. 6. Vicodin 5/500 one tablet p.o. q.6h. p.r.n. pain. 7. Klor-Con 40 mEq p.o. daily. 8. Lamictal 200 mg p.o. q.h.s. 9. Nexium 40 mg p.o. daily. 10.Sinemet 25/250 one tablet p.o. t.i.d. 11.Vitamin D2 50,000 units p.o. weekly on Mondays.  CONSULTANTS:  None.  PROCEDURES:  None.  DIAGNOSTIC STUDIES: 1. Chest x-ray, two-view which shows no acute cardiopulmonary disease. 2. MR of the spine with and without contrast which shows acute or     subacute superior endplate compression fracture at L1.  No neural     compression.  This appears to be a benign fracture type.  Since the     previous CT scan, there may have been a slight or minimal further     collapse at the L1 superior endplate.  Recommendations are the     patient should be tested for osteoporosis with the DEXA scan.  No     complications seemed relative to recent lumbar puncture.  PRIMARY CARE PHYSICIAN:  Jeannett Senior A. Clent Ridges, MD  PSYCHIATRIST:  Dr. Tiajuana Amass.  NEUROLOGIST:  [place], [facility] .  CHIEF COMPLAINT:  Nausea, vomiting, and  headache for several days.  HISTORY OF PRESENT ILLNESS:  Please refer to the H&P by Dr. Onalee Hua for details of the HPI; however, in short, this is a patient who underwent a lumbar puncture just a few days prior to hospitalization and presents with complaints of nausea, vomiting, and headache.  HOSPITAL COURSE:  The patient was considered for having influenza.  She was started on Tamiflu empirically.  Additionally, a rapid flu test was ordered which the patient refused.  The patient was not on any antibiotics except for the Tamiflu and resolved her symptoms completely. It is unclear at this time whether or not the patient may have had the flu with evaluation of symptoms with the Tamilfu or in fact, this was just secondary to the headache.  Nevertheless, the patient is improved clinically and will be discharged home without any Tamiflu or any antibiotics. 1. Headache, the patient is came in with the headache and description     of the headache is consistent with that of post lumbar puncture.     The patient receiving pain medication and the headache abated.  The     patient at this time is without any headache and is feeling better.  Otherwise, the patient's clinical condition is stable, and she is continued on her other medications for chronic diseases.  DISCHARGE PHYSICAL EXAMINATION:  GENERAL:  At the time of discharge, the patient's clinical condition is stable. VITAL SIGNS:  Her vital signs as follows; temperature 97.9, heart rate 79, respiratory rate 16, blood pressure 115/75, O2 sats are 97% on room air. HEENT EXAMINATION:  She is normocephalic and atraumatic.  Pupils are equally round and reactive to light and accommodation.  Extraocular movements are intact.  Oropharynx is moist.  Excessive erythema and lesions noted. NECK EXAMINATION:  Trachea is midline.  No masses or thyromegaly, no JVD, no carotid bruit. LUNGS:  Clear to auscultation.  No wheezing or rhonchi  noted. CARDIOVASCULAR:  She has a got normal S1 and S2.  No murmurs, rubs, or gallops noted.  PMI is nondisplaced.  No heaves or thrills on palpation. ABDOMEN:  Obese, soft, nontender, nondistended.  No masses, no hepatosplenomegaly is noted. EXTREMITIES:  Show no clubbing, cyanosis, or edema. NEUROLOGICAL:  The patient has no focal neurological deficits, but she is steady on his feet.  FOLLOWUP: 1. The patient is to follow up with her primary care physician, Dr.     Clent Ridges in 1 week. 2. She is to follow up with her neurologist as previously scheduled     and with his psychiatrist as previously scheduled.  The patient will go home with home health physical therapy and occupational therapy and nursing as previously arranged.  DIETARY RESTRICTIONS:  None.  PHYSICAL RESTRICTIONS:  Under the care of physical therapy.  Total time for this discharge 1 hour.     Altha Harm, MD     MAM/MEDQ  D:  01/06/2011  T:  01/06/2011  Job:  [id number]  cc:   Jeannett Senior A. Clent Ridges, MD [address] [place] [address]  Electronically Signed by Marthann Schiller MD on 01/14/2011 09:59:47 PM

## 2011-01-20 LAB — COMPREHENSIVE METABOLIC PANEL
ALT: 12 U/L (ref 0–35)
Alkaline Phosphatase: 58 U/L (ref 39–117)
BUN: 5 mg/dL — ABNORMAL LOW (ref 6–23)
CO2: 27 mEq/L (ref 19–32)
GFR calc non Af Amer: >60 mL/min (ref >60)
Glucose, Bld: 90 mg/dL (ref 70–99)
Potassium: 3.7 mEq/L (ref 3.5–5.1)
Sodium: 144 mEq/L (ref 135–145)
Total Protein: 5.9 g/dL — ABNORMAL LOW (ref 6.0–8.3)

## 2011-01-21 LAB — LIPID PANEL
LDL Cholesterol: 90 mg/dL (ref 0–99)
Total CHOL/HDL Ratio: 3.6 RATIO
VLDL: 18 mg/dL (ref 0–40)

## 2011-01-21 LAB — URINALYSIS, ROUTINE W REFLEX MICROSCOPIC
Glucose, UA: NEGATIVE mg/dL
Nitrite: NEGATIVE
Protein, ur: NEGATIVE mg/dL
Urobilinogen, UA: 0.2 mg/dL (ref 0.0–1.0)

## 2011-01-21 LAB — MAGNESIUM: Magnesium: 2 mg/dL (ref 1.5–2.5)

## 2011-01-21 LAB — GLUCOSE, CAPILLARY: Glucose-Capillary: 102 mg/dL — ABNORMAL HIGH (ref 70–99)

## 2011-01-21 LAB — RAPID URINE DRUG SCREEN, HOSP PERFORMED
Amphetamines: NOT DETECTED
Benzodiazepines: NOT DETECTED
Cocaine: NOT DETECTED
Tetrahydrocannabinol: NOT DETECTED

## 2011-01-21 LAB — CK TOTAL AND CKMB (NOT AT ARMC)
CK, MB: 0.9 ng/mL (ref 0.3–4.0)
Relative Index: INVALID (ref 0.0–2.5)

## 2011-01-21 LAB — POCT CARDIAC MARKERS: Troponin i, poc: <0.05 ng/mL (ref 0.00–0.09)

## 2011-01-21 LAB — CBC
HCT: 40.1 % (ref 36.0–46.0)
MCHC: 34.7 g/dL (ref 30.0–36.0)
MCV: 87 fL (ref 78.0–100.0)
RDW: 13.1 % (ref 11.5–15.5)

## 2011-01-21 LAB — POCT I-STAT, CHEM 8
BUN: 7 mg/dL (ref 6–23)
Calcium, Ion: 1.1 mmol/L — ABNORMAL LOW (ref 1.12–1.32)
Chloride: 105 mEq/L (ref 96–112)
Glucose, Bld: 110 mg/dL — ABNORMAL HIGH (ref 70–99)

## 2011-01-21 LAB — DIFFERENTIAL
Basophils Absolute: 0.1 10*3/uL (ref 0.0–0.1)
Basophils Relative: 1 % (ref 0–1)
Eosinophils Absolute: 0.2 10*3/uL (ref 0.0–0.7)
Eosinophils Relative: 2 % (ref 0–5)
Monocytes Absolute: 0.6 10*3/uL (ref 0.1–1.0)

## 2011-01-21 LAB — CARDIAC PANEL(CRET KIN+CKTOT+MB+TROPI)
CK, MB: 0.7 ng/mL (ref 0.3–4.0)
Total CK: 21 U/L (ref 7–177)
Total CK: 29 U/L (ref 7–177)

## 2011-01-21 LAB — URINE MICROSCOPIC-ADD ON

## 2011-01-23 LAB — PROTIME-INR: INR: 0.96 (ref 0.00–1.49)

## 2011-01-23 LAB — SURGICAL PCR SCREEN: Staphylococcus aureus: NEGATIVE

## 2011-01-24 LAB — DIFFERENTIAL
Eosinophils Relative: 2 % (ref 0–5)
Lymphocytes Relative: 41 % (ref 12–46)
Lymphs Abs: 4.2 10*3/uL — ABNORMAL HIGH (ref 0.7–4.0)
Monocytes Absolute: 0.8 10*3/uL (ref 0.1–1.0)

## 2011-01-24 LAB — CBC
MCH: 30.1 pg (ref 26.0–34.0)
MCHC: 34.3 g/dL (ref 30.0–36.0)
Platelets: 364 10*3/uL (ref 150–400)
RBC: 4.74 MIL/uL (ref 3.87–5.11)
RDW: 13.3 % (ref 11.5–15.5)

## 2011-01-24 LAB — URINALYSIS, ROUTINE W REFLEX MICROSCOPIC
Bilirubin Urine: NEGATIVE
Hgb urine dipstick: NEGATIVE
Nitrite: NEGATIVE
Specific Gravity, Urine: 1.023 (ref 1.005–1.030)
pH: 5.5 (ref 5.0–8.0)

## 2011-01-24 LAB — URINE MICROSCOPIC-ADD ON

## 2011-01-24 LAB — COMPREHENSIVE METABOLIC PANEL
AST: 17 U/L (ref 0–37)
Albumin: 3.9 g/dL (ref 3.5–5.2)
Calcium: 9.3 mg/dL (ref 8.4–10.5)
Creatinine, Ser: 0.7 mg/dL (ref 0.4–1.2)
GFR calc Af Amer: >60 mL/min (ref >60)
Total Protein: 7.1 g/dL (ref 6.0–8.3)

## 2011-01-30 DIAGNOSIS — Z9181 History of falling: Secondary | ICD-10-CM

## 2011-01-30 DIAGNOSIS — G219 Secondary parkinsonism, unspecified: Secondary | ICD-10-CM

## 2011-01-30 DIAGNOSIS — F319 Bipolar disorder, unspecified: Secondary | ICD-10-CM

## 2011-01-30 DIAGNOSIS — F0390 Unspecified dementia without behavioral disturbance: Secondary | ICD-10-CM

## 2011-01-30 DIAGNOSIS — IMO0001 Reserved for inherently not codable concepts without codable children: Secondary | ICD-10-CM

## 2011-02-05 ENCOUNTER — Other Ambulatory Visit: Payer: Self-pay

## 2011-02-05 MED ORDER — CARBIDOPA-LEVODOPA 25-100 MG PO TABS: 1.0000 | ORAL_TABLET | Freq: Three times a day (TID) | ORAL | Status: DC

## 2011-02-05 NOTE — Telephone Encounter (Signed)
Spoke with Audrey Brown and rx for simimet at her request [facility]

## 2011-03-06 ENCOUNTER — Encounter: Payer: Self-pay | Admitting: Family Medicine

## 2011-03-17 ENCOUNTER — Ambulatory Visit (INDEPENDENT_AMBULATORY_CARE_PROVIDER_SITE_OTHER): Admitting: Family Medicine

## 2011-03-17 ENCOUNTER — Encounter: Payer: Self-pay | Admitting: Family Medicine

## 2011-03-17 VITALS — BP 118/68 | HR 80 | Temp 98.2°F | Resp 16 | Wt 155.0 lb

## 2011-03-17 DIAGNOSIS — G4700 Insomnia, unspecified: Secondary | ICD-10-CM

## 2011-03-17 DIAGNOSIS — K219 Gastro-esophageal reflux disease without esophagitis: Secondary | ICD-10-CM

## 2011-03-17 DIAGNOSIS — F329 Major depressive disorder, single episode, unspecified: Secondary | ICD-10-CM

## 2011-03-17 DIAGNOSIS — G20 Parkinson's disease: Secondary | ICD-10-CM

## 2011-03-17 NOTE — Progress Notes (Signed)
  Subjective:    Patient ID: Audrey Brown, female    DOB: December 04, 1945, 65 y.o.   MRN: [id number]  HPI Here to follow up on multiple issues. She still see Dr. 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 She is still on Parkinsonian meds because these are used to treat both these conditions. She seems to have stabilized in general. Her moods are much improved. She would like to try Lunesta again for sleep if it is okay with me. Her appetite remains low, and she has lost about 8 lbs since February. She has seen a Dr. Deborha Payment once ([facility]) to help with utilizing any [facility] resources that she can qualify for.    Review of Systems  Respiratory: Negative.   Cardiovascular: Negative.   Gastrointestinal: Negative.   Genitourinary: Negative.   Neurological: Positive for tremors and weakness.  Psychiatric/Behavioral: Negative.        Objective:   Physical Exam  Constitutional: She is oriented to person, place, and time.       Alert, in her wheelchair  Neurological: She is alert and oriented to person, place, and time. No cranial nerve deficit.       Her tremors appear to be stable, primarily in the hands and head. Her voice is staccato but she is able to express herself well   Psychiatric: She has a normal mood and affect. Her behavior is normal. Judgment and thought content normal.          Assessment & Plan:  I told her it would be fine to use Lunesta once in a while. Follow up with Dr. Zola Button as scheduled.

## 2011-04-12 ENCOUNTER — Emergency Department (HOSPITAL_COMMUNITY)
Admission: EM | Admit: 2011-04-12 | Discharge: 2011-04-12 | Disposition: A | Discharge status: Other Acute Inpatient Hospital | Attending: Emergency Medicine | Admitting: Emergency Medicine

## 2011-04-12 DIAGNOSIS — F39 Unspecified mood [affective] disorder: Secondary | ICD-10-CM

## 2011-04-12 DIAGNOSIS — T50901A Poisoning by unspecified drugs, medicaments and biological substances, accidental (unintentional), initial encounter: Secondary | ICD-10-CM | POA: Insufficient documentation

## 2011-04-12 DIAGNOSIS — Z91199 Patient's noncompliance with other medical treatment and regimen due to unspecified reason: Secondary | ICD-10-CM | POA: Insufficient documentation

## 2011-04-12 DIAGNOSIS — F411 Generalized anxiety disorder: Secondary | ICD-10-CM | POA: Insufficient documentation

## 2011-04-12 DIAGNOSIS — F3289 Other specified depressive episodes: Secondary | ICD-10-CM | POA: Insufficient documentation

## 2011-04-12 DIAGNOSIS — F329 Major depressive disorder, single episode, unspecified: Secondary | ICD-10-CM | POA: Insufficient documentation

## 2011-04-12 DIAGNOSIS — Z9119 Patient's noncompliance with other medical treatment and regimen: Secondary | ICD-10-CM | POA: Insufficient documentation

## 2011-04-12 DIAGNOSIS — Z79899 Other long term (current) drug therapy: Secondary | ICD-10-CM | POA: Insufficient documentation

## 2011-04-12 DIAGNOSIS — T50902A Poisoning by unspecified drugs, medicaments and biological substances, intentional self-harm, initial encounter: Secondary | ICD-10-CM | POA: Insufficient documentation

## 2011-04-12 LAB — COMPREHENSIVE METABOLIC PANEL
ALT: 19 U/L (ref 0–35)
AST: 13 U/L (ref 0–37)
CO2: 25 mEq/L (ref 19–32)
Calcium: 9.1 mg/dL (ref 8.4–10.5)
GFR calc Af Amer: >60 mL/min (ref >60)
Sodium: 137 mEq/L (ref 135–145)
Total Protein: 6.7 g/dL (ref 6.0–8.3)

## 2011-04-12 LAB — URINALYSIS, ROUTINE W REFLEX MICROSCOPIC
Glucose, UA: NEGATIVE mg/dL
Hgb urine dipstick: NEGATIVE
Specific Gravity, Urine: 1.007 (ref 1.005–1.030)
pH: 7 (ref 5.0–8.0)

## 2011-04-12 LAB — RAPID URINE DRUG SCREEN, HOSP PERFORMED
Amphetamines: NOT DETECTED
Barbiturates: NOT DETECTED
Benzodiazepines: NOT DETECTED
Opiates: NOT DETECTED

## 2011-04-12 LAB — CBC
Hemoglobin: 13.7 g/dL (ref 12.0–15.0)
MCH: 29.8 pg (ref 26.0–34.0)
RBC: 4.59 MIL/uL (ref 3.87–5.11)

## 2011-04-12 LAB — PROTIME-INR
INR: 0.93 (ref 0.00–1.49)
Prothrombin Time: 12.7 seconds (ref 11.6–15.2)

## 2011-04-12 LAB — DIFFERENTIAL
Basophils Relative: 1 % (ref 0–1)
Lymphocytes Relative: 40 % (ref 12–46)
Monocytes Relative: 7 % (ref 3–12)
Neutro Abs: 4.6 10*3/uL (ref 1.7–7.7)
Neutrophils Relative %: 51 % (ref 43–77)

## 2011-05-07 ENCOUNTER — Ambulatory Visit: Admitting: Family Medicine

## 2011-05-15 ENCOUNTER — Ambulatory Visit (INDEPENDENT_AMBULATORY_CARE_PROVIDER_SITE_OTHER): Admitting: Family Medicine

## 2011-05-15 ENCOUNTER — Encounter: Payer: Self-pay | Admitting: Gastroenterology

## 2011-05-15 ENCOUNTER — Telehealth: Payer: Self-pay | Admitting: Family Medicine

## 2011-05-15 ENCOUNTER — Encounter: Payer: Self-pay | Admitting: Family Medicine

## 2011-05-15 VITALS — BP 110/68 | HR 64 | Temp 98.2°F | Wt 155.0 lb

## 2011-05-15 DIAGNOSIS — M549 Dorsalgia, unspecified: Secondary | ICD-10-CM

## 2011-05-15 DIAGNOSIS — F319 Bipolar disorder, unspecified: Secondary | ICD-10-CM

## 2011-05-15 DIAGNOSIS — R1310 Dysphagia, unspecified: Secondary | ICD-10-CM

## 2011-05-15 MED ORDER — QUETIAPINE FUMARATE 50 MG PO TABS: 50.0000 mg | ORAL_TABLET | Freq: Every day | ORAL | Status: AC

## 2011-05-15 MED ORDER — ESZOPICLONE 2 MG PO TABS: 2.0000 mg | ORAL_TABLET | Freq: Every day | ORAL | Status: DC

## 2011-05-15 MED ORDER — DIVALPROEX SODIUM ER 500 MG PO TB24: 1000.0000 mg | ORAL_TABLET | Freq: Every day | ORAL | Status: DC

## 2011-05-15 NOTE — Progress Notes (Signed)
Addended by: Gershon Crane A on: 05/15/2011 09:56 AM   Modules accepted: Orders

## 2011-05-15 NOTE — Telephone Encounter (Signed)
Pt is requesting a script for Lunesta.

## 2011-05-15 NOTE — Telephone Encounter (Signed)
Call in Lunesta 2 mg qhs, #30 with 5 rf

## 2011-05-15 NOTE — Progress Notes (Signed)
  Subjective:    Patient ID: Audrey Brown, female    DOB: [birth year], 65 y.o.   MRN: [id number]  HPI Here to follow up a psychiatric stay at [facility] in [place] from 04-24-11 to 05-04-11 for bipolar disorder. This started with a suicide attempt by overdose on 04-24-11, resulting in a fall at home. She was taken by EMS to [facility] ER. She was stabilized and sent to [facility] after that. While there she was taken off Lamictal and Wellbutrin and was placed on Depakote. Since getting back home she still does not feel well and she still has a lot of mood swings. She saw the PA at Dr. Maryln Gottron office last week, and she is trying to get [name] into the [facility] Psychiatry department to be seen. Also since the fall, her back has been causing a lot more pain. She is using Advil and Vicodin. Finally she had an abnormal swallowing study in May, and she has been having trouble swallowing food and medications since then.    Review of Systems  HENT: Positive for trouble swallowing.   Musculoskeletal: Positive for back pain.  Neurological: Positive for tremors, speech difficulty and weakness.  Psychiatric/Behavioral: Positive for dysphoric mood, decreased concentration and agitation. The patient is nervous/anxious.        Objective:   Physical Exam  Constitutional: She is oriented to person, place, and time.       Alert, in the wheelchair   Cardiovascular: Normal rate, regular rhythm, normal heart sounds and intact distal pulses.   Pulmonary/Chest: Effort normal and breath sounds normal.  Neurological: She is alert and oriented to person, place, and time.  Psychiatric:       Very anxious, good eye contact           Assessment & Plan:  We will add low dose Seroquel to her Depakote at bedtime and slowly titrate up on the dose to slow the mood swings. Hopefully she can see a psychiatrist at [facility]. Will refer to a Rehab specialist for the back pain. Set up an EGD.

## 2011-05-22 ENCOUNTER — Telehealth: Payer: Self-pay | Admitting: Family Medicine

## 2011-05-22 NOTE — Telephone Encounter (Signed)
Pt was last here on

## 2011-05-22 NOTE — Telephone Encounter (Signed)
Daughter called 7/13. Her mother is completely out of her Depakote 500 ER . Please call in as soon as you can.

## 2011-05-22 NOTE — Telephone Encounter (Signed)
I spoke with pt and the doctor that put her on it does not want to do a refill. Pt said that she really didn't want to be on the medication anyway. Please advise?

## 2011-05-22 NOTE — Telephone Encounter (Signed)
Have Dr Clent Ridges address this.

## 2011-05-22 NOTE — Telephone Encounter (Signed)
Pt was last here on 05/15/11 and she took her last dose last night. Please advise?

## 2011-05-25 ENCOUNTER — Telehealth: Payer: Self-pay | Admitting: Family Medicine

## 2011-05-25 MED ORDER — DIVALPROEX SODIUM ER 500 MG PO TB24: 1000.0000 mg | ORAL_TABLET | Freq: Every day | ORAL | Status: DC

## 2011-05-25 NOTE — Telephone Encounter (Signed)
Left message for pt or pt's daughter, Angelique Blonder, to call back.   Need to get a correct home number for emergency contact listed, Angelique Blonder. Number listed is [email or web address]

## 2011-05-25 NOTE — Telephone Encounter (Signed)
As I told her at our last visit, I want her to stay on Depakote until she sees the [facility] psychiatrist. Call in Depakote ER 500mg  , to take 2 tabs a day, #60 with 5 rf

## 2011-05-25 NOTE — Telephone Encounter (Signed)
I called in script to [facility] with no refills, pt requested refills to be [facility], which I sent. I spoke with pt and gave info.

## 2011-05-25 NOTE — Telephone Encounter (Signed)
I spoke with pt and she stated that her insurance would not cover the Lunesta. She has tried Ambien in the past and it did not work as well. Can we recommend another sleep aid or try again for the approval on the Lunesta?

## 2011-05-27 NOTE — Telephone Encounter (Signed)
There is no point in trying again for the Lunesta. Try Temazepam 30 mg qhs. Call in #30 with 5 rf

## 2011-05-27 NOTE — Telephone Encounter (Signed)
Left message on machine for patient  To return our call 

## 2011-05-28 ENCOUNTER — Telehealth: Payer: Self-pay | Admitting: Family Medicine

## 2011-05-28 NOTE — Telephone Encounter (Signed)
This pt called a bit ago and said she missed a call from Dr. Claris Che office. She did not catch the name of the person who called, but I thought it was you. You were not at your desk, and she did not want to leave a vmail. Please call the pt when you can.

## 2011-05-29 NOTE — Telephone Encounter (Signed)
I spoke with [name] was approved.

## 2011-05-29 NOTE — Telephone Encounter (Signed)
I spoke with pt and the Alfonso Patten was approved, so pt picked up and will take if needed.

## 2011-06-16 ENCOUNTER — Ambulatory Visit (INDEPENDENT_AMBULATORY_CARE_PROVIDER_SITE_OTHER): Admitting: Gastroenterology

## 2011-06-16 ENCOUNTER — Encounter: Payer: Self-pay | Admitting: Gastroenterology

## 2011-06-16 DIAGNOSIS — R1310 Dysphagia, unspecified: Secondary | ICD-10-CM | POA: Insufficient documentation

## 2011-06-16 DIAGNOSIS — K219 Gastro-esophageal reflux disease without esophagitis: Secondary | ICD-10-CM

## 2011-06-16 DIAGNOSIS — K222 Esophageal obstruction: Secondary | ICD-10-CM | POA: Insufficient documentation

## 2011-06-16 DIAGNOSIS — G20C Parkinsonism, unspecified: Secondary | ICD-10-CM | POA: Insufficient documentation

## 2011-06-16 DIAGNOSIS — G20 Parkinson's disease: Secondary | ICD-10-CM

## 2011-06-16 DIAGNOSIS — F3160 Bipolar disorder, current episode mixed, unspecified: Secondary | ICD-10-CM

## 2011-06-16 MED ORDER — ESOMEPRAZOLE MAGNESIUM 40 MG PO CPDR: 40.0000 mg | DELAYED_RELEASE_CAPSULE | Freq: Two times a day (BID) | ORAL | Status: DC

## 2011-06-16 NOTE — Progress Notes (Signed)
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 The patient relates chronic acid reflux for greater than 10 years, with a negative endoscopy 10 years ago. She has rather typical regurgitation and burning substernal chest pain with associated coughing, throat clearing, and a globus sensation in her throat. However, over the last year she's had progressive solid food dysphagia for bread and meats. She recently had a modified barium swallow which showed no evidence of neurogenic dysfunction in the upper pharyngeal area. The patient has been on Nexium 40 mg a day for several years. Despite of all these complaints, her weight appears to be stable at this time although she apparently has had multiple weight swings related to her medications. Her daughter is with her today during the interview and exam, In ant case she is markedly improved on her current neurological and psychotropic medications which are listed and reviewed in her record. The patient denies lower gastrointestinal or hepatobiliary problems, and refuses colonoscopy exam. Recent CBC is reviewed and was normal.  I have reviewed this patient's present history, medical and surgical past history, allergies and medications.     ROS: The remainder of the 10 point ROS is negative... her chronic confusion and depression have improved as mentioned above. She continues with chronic insomnia and takes Seroquel 50 mg at bedtime. There also is a history of chronic thyroid dysfunction. Her primary care physician is Dr. Gershon Crane.  Past Medical History  Diagnosis Date  . Headache   . GERD (gastroesophageal reflux disease)   . corticobasilar degeneration     sees Dr. Zola Button at [facility]  . Osteoarthritis     has seen Dr Lestine Box  . Bipolar depression     had seen Dr. Tomasa Rand in  past   . Dementia    Past Surgical History  Procedure Date  . Tonsillectomy   . Cesarean section   . Cervical disc surgery 1994    c-6   . Wrist surgery 06-24-2010    Dr Rayburn Ma    reports that she has never smoked. She has never used smokeless tobacco. She reports that she does not drink alcohol or use illicit drugs. family history includes Emphysema in her mother and Heart attack in her father. Allergies  Allergen Reactions  . Celecoxib     REACTION: hives  . Morphine And Related Hives  . Penicillins         Physical Exam: General well developed well nourished patient in no acute distress, appearing her  stated age Eyes PERRLA, no icterus, fundoscopic exam per opthamologist Skin no lesions noted Neck supple, no adenopathy, no thyroid enlargement, no tenderness.Marland Kitchen oropharyngeal exam is unremarkable with a rather active gag reflex. Chest clear to percussion and auscultation Heart no significant murmurs, gallops or rubs noted Abdomen no hepatosplenomegaly masses or tenderness, BS normal. . Extremities no acute joint lesions, edema, phlebitis or evidence of cellulitis. Neurologic patient oriented x 3, and mental status appears normal. No active tremor gross neurologic dysfunction noted at this time. The patient is ambulatory and oriented x3 and very cooperative. Psychological mental status normal and normal affect.  Assessment and plan: Solid food dysphagia probably related to chronic acid reflux and a peptic stricture distal esophagus, rule out Barrett's mucosa and esophageal carcinoma. I have increased her Nexium to 40 mg twice a day pending endoscopic exam to be performed with propofol sedation  because of her numerous medications . Patient has multiple extra esophageal manifestations of acid reflux including coughing, hoarseness, throat clearing, and a globus sensation. She has had a previous cervical plate  placed some 20 years ago. As above, the patient has refused colonoscopy  exam which should be done as a screening procedure. We have reviewed endoscopy, esophageal dilatation, this risk and benefits with the patient and her family in detail, and they have agreed to proceed.

## 2011-06-16 NOTE — Patient Instructions (Signed)
Increase your Nexium to twice a day 30 min before breakfast and 30 min before dinner.  Your procedure has been scheduled for 06/26/2011, please follow the seperate instructions.     Upper GI Endoscopy Upper GI endoscopy means using a flexible scope to look at the esophagus, stomach and upper small bowel. This is done to make a diagnosis in people with heartburn, abdominal pain, or abnormal bleeding. Sometimes an endoscope is needed to remove foreign bodies or food that become stuck in the esophagus; it can also be used to take biopsy samples. For the best results, do not eat or drink for 8 hours before having your upper endoscopy.  To perform the endoscopy, you will probably be sedated and your throat will be numbed with a special spray. The endoscope is then slowly passed down your throat (this will not interfere with your breathing). An endoscopy exam takes 15-30 minutes to complete and there is no real pain. Patients rarely remember much about the procedure. The results of the test may take several days if a biopsy or other test is taken.  You may have a sore throat after an endoscopy exam. Serious complications are very rare. Stick to liquids and soft foods until your pain is better. You should not drive a car or operate any dangerous equipment for at least 24 hours after being sedated. SEEK IMMEDIATE MEDICAL CARE IF:  You have severe throat pain.   You have shortness of breath.   You have bleeding problems.   You have a fever.   You have difficulty recovering from your sedation.  Document Released: 12/03/2004 Document Re-Released: 01/20/2010 [facility] Patient Information 2011 [place], [place].  Esophageal Dilatation Making a Narrowing in the Esophagus Larger The esophagus is the long, narrow tube which carries food and liquid from the mouth to the stomach. Esophageal dilatation is the technique used to stretch a blocked or narrowed portion of the esophagus. This procedure is used when a  part of the esophagus has become so narrow that it becomes difficult, painful or even impossible to swallow. This is generally an uncomplicated form of treatment. When this is not successful, chest surgery may be required. This is a much more extensive form of treatment with a longer recovery time.  CAUSES Some of the more common causes of blockage or strictures of the esophagus are:  Narrowing from longstanding inflammation (soreness and redness) of the lower esophagus. This comes from the constant exposure of the lower esophagus to the acid which bubbles up from the stomach. Over time this causes scarring and narrowing of the lower esophagus.   Hiatal hernia in which a small part of the stomach bulges (herniates) up through the diaphragm. This can cause a gradual narrowing of the end of the esophagus.   Schatzki's Ring is a narrow ring of benign (non-cancerous) fibrous tissue which constricts the lower esophagus. The reason for this is not known.   Scleroderma is a connective tissue disorder that affects the esophagus and makes swallowing difficult.   Achalasia is an absence of nerves to the lower esophagus and to the esophageal sphincter. This is the circular muscle between the stomach and esophagus that relaxes to allow food into the stomach. After swallowing, it contracts to keep food in the stomach. This absence of nerves may be congenital (present since birth). This can cause irregular spasms of the lower esophageal muscle. This spasm does not open up to allow food and fluid through. The result is a persistent blockage with subsequent  slow trickling of the esophageal contents into the stomach.   Strictures may develop from swallowing materials which damage the esophagus. Some examples are strong acids or alkalis such as lye.   Growths such as benign (non-cancerous) and malignant (cancerous) tumors can block the esophagus.   Heredity (present since birth) causes.  DIAGNOSIS Your caregiver  often suspects this problem by taking a medical history. They will also do a physical exam. They can then prove their suspicions using X-rays and endoscopy. Endoscopy is an exam in which a tube like a small flexible telescope is used to look at your esophagus.  TREATMENT AND PROCEDURE There are different stretching (dilating) techniques which can be used. Simple bougie dilatation may be done in the office. This usually takes only a couple minutes. A numbing (anesthetic) spray of the throat is used. Endoscopy, when done, is done in an endoscopy suite, under mild sedation. When fluoroscopy is used, the procedure is performed in x-ray. Other techniques require a little longer time. Recovery is usually quick. There is no waiting time to begin eating and drinking to test success of the treatment. Following are some of the methods used. Narrowing of the esophagus is treated by making it bigger. Commonly this is a mechanical problem which can be treated with stretching. This can be done in different ways. Your caregiver will discuss these with you. Some of the means used are:  A series of graduated (increasing thickness) flexible dilators called Bougies can be used. These are weighted tubes passed through the esophagus into the stomach. The tubes used become progressively larger until the desired stretched size is reached. Bougies are a simple and quick way of opening the esophagus. No visualization is required. The most commonly used mercury weighted bougies are the Maloney bougie dilators.   Another method is the use of endoscopy to place a flexible wire across the stricture. The endoscope is removed and the wire left in place. A dilator with a hole through it from end to end is guided down the esophagus and across the stricture. One or more of these dilators are passed over the wire. At the end of the exam, the wire is removed. This type of treatment may be performed in the x-ray department under fluoroscopy. An  advantage of this procedure is the examiner is visualizing the end opening in the esophagus. The most commonly used bougie over guide wire dilators are the Savary or Savary-Guillard dilators.   Stretching of the esophagus may be done using balloons. Deflated balloons are placed through the endoscope and across the stricture. This type of balloon dilatation is often done at the time of endoscopy or fluoroscopy. Flexible endoscopy allows the examiner to directly view the stricture. A balloon is inserted in the deflated form into the area of narrowing. It is then inflated with air to a certain pressure that is pre-set for a given circumference. When inflated, it becomes sausage shaped, stretched, and makes the stricture larger.   Achalasia requires a longer larger balloon-type dilator. This is frequently done under x-ray control. In this situation, the spastic muscle fibers in the lower esophagus are stretched.  All of the above procedures make the passage of food and water into the stomach easier. They also make it easier for stomach contents to reflux back into the esophagus. Special medications may be used following the procedure to help prevent further stricturing. Proton-pump inhibitors (Prevacid, Prilosec, Protonix, and Aciphex) are good at decreasing the amount of acid in the stomach juice.  When stomach juice refluxes into the esophagus, the juice is no longer as acidic and is less likely to burn or scar the esophagus. COMPLICATIONS Esophageal dilatation is usually performed effectively and without problems. Some complications that can occur are:  A small amount of bleeding almost always happens where the stretching takes place. If this is too excessive it may require more aggressive treatment.   An uncommon complication is perforation (making a hole) of the esophagus. The esophagus is thin. It is easy to make a hole in it. If this happens, an operation may be necessary to repair this.   A  small, undetected perforation could lead to an infection in the chest. This can be very serious.  HOME CARE INSTRUCTIONS  If you received sedation for your procedure, do not drive, make important decisions, or perform any activities requiring your full co-ordination. Do not drink alcohol, take sedatives, or use any mind altering chemicals unless instructed by your caregiver.   You may use throat lozenges or warm salt water gargles if you have throat discomfort   You can begin eating and drinking normally on return home unless instructed otherwise. Do not purposely try to force large chunks of food down to test the benefits of your procedure.   Mild discomfort can be eased with sips of ice water.   Medications for discomfort may or may not be needed.  SEEK IMMEDIATE MEDICAL CARE IF:  You begin vomiting up blood.   You develop black tarry stools   You develop chills or an unexplained temperature of over 101 F (38.3 C)   You develop chest or abdominal pain.   You develop shortness of breath or feel lightheaded or faint.   Your swallowing is becoming more painful, difficult, or you are unable to swallow.  MAKE SURE YOU:   Understand these instructions.   Will watch your condition.   Will get help right away if you are not doing well or get worse.  Document Released: 12/17/2005 Document Re-Released: 04/15/2010 [facility] Patient Information 2011 [place], [place].

## 2011-06-17 ENCOUNTER — Telehealth: Payer: Self-pay | Admitting: Family Medicine

## 2011-06-17 DIAGNOSIS — M545 Low back pain: Secondary | ICD-10-CM

## 2011-06-17 NOTE — Telephone Encounter (Signed)
Pt called 8/8. Has been seeing Phys Therapy in [facility]. She is not responding, and they want her to see one of their Orthopedists, Dr. Jennefer Bravo. His office phone is [phone] She is requesting a referral - please call her if necessary.

## 2011-06-18 NOTE — Telephone Encounter (Signed)
Please put in a referral to this Orthopedist for low back pain

## 2011-06-18 NOTE — Telephone Encounter (Signed)
Referral put in computer.

## 2011-06-26 ENCOUNTER — Ambulatory Visit (AMBULATORY_SURGERY_CENTER): Admitting: Gastroenterology

## 2011-06-26 ENCOUNTER — Encounter: Payer: Self-pay | Admitting: Gastroenterology

## 2011-06-26 DIAGNOSIS — K222 Esophageal obstruction: Secondary | ICD-10-CM

## 2011-06-26 DIAGNOSIS — R1310 Dysphagia, unspecified: Secondary | ICD-10-CM

## 2011-06-26 MED ORDER — SODIUM CHLORIDE 0.9 % IV SOLN: 500.0000 mL | INTRAVENOUS | Status: DC

## 2011-06-26 NOTE — Patient Instructions (Signed)
Please review discharge instructions (blue and green sheets) Continue current medications  Follow dilation diet today, resume regular diet tomorrow

## 2011-06-29 ENCOUNTER — Telehealth: Payer: Self-pay | Admitting: *Deleted

## 2011-06-29 NOTE — Telephone Encounter (Signed)
Follow up Call- Patient questions:  Do you have a fever, pain , or abdominal swelling? no Pain Score  0 *  Have you tolerated food without any problems? yes  Have you been able to return to your normal activities? no  Do you have any questions about your discharge instructions: Diet   no Medications  no Follow up visit  no  Do you have questions or concerns about your Care? no  Actions: * If pain score is 4 or above: No action needed, pain <4.   

## 2011-07-20 ENCOUNTER — Ambulatory Visit (INDEPENDENT_AMBULATORY_CARE_PROVIDER_SITE_OTHER): Admitting: Family Medicine

## 2011-07-20 ENCOUNTER — Encounter: Payer: Self-pay | Admitting: Family Medicine

## 2011-07-20 VITALS — BP 116/72 | HR 83 | Temp 98.6°F | Wt 164.0 lb

## 2011-07-20 DIAGNOSIS — J40 Bronchitis, not specified as acute or chronic: Secondary | ICD-10-CM

## 2011-07-20 MED ORDER — HYDROCODONE-HOMATROPINE 5-1.5 MG/5ML PO SYRP: 5.0000 mL | ORAL_SOLUTION | ORAL | Status: AC | PRN

## 2011-07-20 MED ORDER — METHYLPREDNISOLONE ACETATE 80 MG/ML IJ SUSP
120.0000 mg | Freq: Once | INTRAMUSCULAR | Status: AC
  Administered 2011-07-20: 120 mg via INTRAMUSCULAR

## 2011-07-20 MED ORDER — AZITHROMYCIN 250 MG PO TABS: ORAL_TABLET | ORAL | Status: AC

## 2011-07-20 NOTE — Progress Notes (Signed)
  Subjective:    Patient ID: Audrey Brown, female    DOB: [birth year], 65 y.o.   MRN: [id number]  HPI Here for 4 days of sinus pressure, PND, ST, chest congestion, and coughing up green sputum. Low grade fevers. Drinking fluids.    Review of Systems  Constitutional: Positive for fever.  HENT: Positive for congestion, postnasal drip and sinus pressure.   Eyes: Negative.   Respiratory: Positive for cough, shortness of breath and wheezing.        Objective:   Physical Exam  Constitutional: She appears well-developed and well-nourished.  HENT:  Right Ear: External ear normal.  Left Ear: External ear normal.  Nose: Nose normal.  Mouth/Throat: Oropharynx is clear and moist. No oropharyngeal exudate.  Eyes: Conjunctivae are normal. Pupils are equal, round, and reactive to light.  Neck: No thyromegaly present.  Pulmonary/Chest: She has no rales.       Scattered wheezes and rhonchi  Lymphadenopathy:    She has no cervical adenopathy.          Assessment & Plan:  Recheck prn

## 2011-08-15 ENCOUNTER — Other Ambulatory Visit: Payer: Self-pay | Admitting: Family Medicine

## 2011-09-11 ENCOUNTER — Encounter: Payer: Self-pay | Admitting: Family Medicine

## 2011-09-11 ENCOUNTER — Ambulatory Visit (INDEPENDENT_AMBULATORY_CARE_PROVIDER_SITE_OTHER): Admitting: Family Medicine

## 2011-09-11 DIAGNOSIS — R112 Nausea with vomiting, unspecified: Secondary | ICD-10-CM

## 2011-09-11 DIAGNOSIS — R51 Headache: Secondary | ICD-10-CM

## 2011-09-11 MED ORDER — PROMETHAZINE HCL 25 MG PO TABS: 25.0000 mg | ORAL_TABLET | ORAL | Status: DC | PRN

## 2011-09-11 NOTE — Progress Notes (Signed)
  Subjective:    Patient ID: Audrey Brown, female    DOB: [birth year], 65 y.o.   MRN: [id number]  HPI Here for 6 weeks of intermittent dull HAs, nausea with occasional vomiting, and lightheadedness. No neck pain or fever. No vision changes or ST or cough. About 8 weeks ago her Neurologist increased her Lamictal from 100 mg a day to 200 mg a day. No other recent changes.    Review of Systems  Constitutional: Negative.   Respiratory: Negative.   Cardiovascular: Negative.   Gastrointestinal: Positive for nausea and vomiting. Negative for abdominal pain, diarrhea, constipation and abdominal distention.  Genitourinary: Negative.   Neurological: Positive for tremors and headaches.       Objective:   Physical Exam  Constitutional:       Alert, appears uncomfortable. No photophobia   HENT:  Head: Normocephalic and atraumatic.  Right Ear: External ear normal.  Left Ear: External ear normal.  Nose: Nose normal.  Mouth/Throat: Oropharynx is clear and moist. No oropharyngeal exudate.  Eyes: Conjunctivae and EOM are normal. Pupils are equal, round, and reactive to light.  Neck: No thyromegaly present.  Cardiovascular: Normal rate, regular rhythm, normal heart sounds and intact distal pulses.   Pulmonary/Chest: Effort normal and breath sounds normal.  Abdominal: Soft. Bowel sounds are normal. She exhibits no distension and no mass. There is no tenderness. There is no rebound and no guarding.  Lymphadenopathy:    She has no cervical adenopathy.          Assessment & Plan:  This may be side effects of high dose Lamictal. She is to take 1/2 tablet of this a day. Use Phenergan prn. She will inform her Neurologist of this next week and see what they want to do. There are no indications that this is due to an infection at this time.

## 2011-09-15 ENCOUNTER — Telehealth: Payer: Self-pay | Admitting: Family Medicine

## 2011-09-15 NOTE — Telephone Encounter (Signed)
Pt called and has shingles and is req a work in [employer] with pcp, if not today, sometime tomorrow or next day. Pls advise.

## 2011-09-16 NOTE — Telephone Encounter (Signed)
Please schedule for tomorrow, 09/17/11.

## 2011-09-16 NOTE — Telephone Encounter (Signed)
Called pt and sch her for work in ov with Dr Clent Ridges on 09/17/11 at 9:30am as noted.

## 2011-09-17 ENCOUNTER — Ambulatory Visit (INDEPENDENT_AMBULATORY_CARE_PROVIDER_SITE_OTHER): Admitting: Family Medicine

## 2011-09-17 ENCOUNTER — Telehealth: Payer: Self-pay | Admitting: Family Medicine

## 2011-09-17 ENCOUNTER — Encounter: Payer: Self-pay | Admitting: Family Medicine

## 2011-09-17 VITALS — BP 116/78 | HR 111 | Temp 97.6°F

## 2011-09-17 DIAGNOSIS — B029 Zoster without complications: Secondary | ICD-10-CM

## 2011-09-17 MED ORDER — VALACYCLOVIR HCL 1 G PO TABS: 2000.0000 mg | ORAL_TABLET | Freq: Three times a day (TID) | ORAL | Status: DC

## 2011-09-17 MED ORDER — PREDNISONE (PAK) 10 MG PO TABS: ORAL_TABLET | ORAL | Status: DC

## 2011-09-17 MED ORDER — VALACYCLOVIR HCL 1 G PO TABS: 1000.0000 mg | ORAL_TABLET | Freq: Three times a day (TID) | ORAL | Status: AC

## 2011-09-17 NOTE — Telephone Encounter (Signed)
Please call regarding dosage on Valtrex. States it is 2,000mg  TID. Thanks.

## 2011-09-17 NOTE — Telephone Encounter (Signed)
This should be 1000 mg tid. Please call the pharmacy to change this

## 2011-09-17 NOTE — Progress Notes (Signed)
  Subjective:    Patient ID: Audrey Brown, female    DOB: Mar 18, 1946, 65 y.o.   MRN: [id number]  HPI Here for a burning and itching sensation on the right side of her trunk for the past week. Also for several days she has had a red rash under the right breast.    Review of Systems  Constitutional: Negative.   Respiratory: Negative.   Cardiovascular: Negative.   Skin: Positive for rash.       Objective:   Physical Exam  Constitutional: She appears well-developed and well-nourished.  Cardiovascular: Normal rate, regular rhythm, normal heart sounds and intact distal pulses.   Pulmonary/Chest: Effort normal and breath sounds normal.  Skin:       There is a cluster of red vesicles under the right breast           Assessment & Plan:  Recheck prn

## 2011-09-17 NOTE — Telephone Encounter (Signed)
Called and changed rx to take 1 tablet by mouth tid.

## 2011-11-24 ENCOUNTER — Telehealth: Payer: Self-pay | Admitting: Family Medicine

## 2011-11-24 NOTE — Telephone Encounter (Signed)
Refill request for Lunesta 2 mg take 1 po qhs and pt last here on 09/17/11.

## 2011-11-25 MED ORDER — ESZOPICLONE 2 MG PO TABS: 2.0000 mg | ORAL_TABLET | Freq: Every day | ORAL | Status: DC

## 2011-11-25 NOTE — Telephone Encounter (Signed)
Call in 6 month supply  

## 2011-11-25 NOTE — Telephone Encounter (Signed)
Rx called in 

## 2011-11-26 ENCOUNTER — Telehealth: Payer: Self-pay | Admitting: *Deleted

## 2011-11-26 NOTE — Telephone Encounter (Signed)
Refill on lunesta 2mg 

## 2011-11-27 MED ORDER — ESZOPICLONE 2 MG PO TABS: 2.0000 mg | ORAL_TABLET | Freq: Every day | ORAL | Status: DC

## 2011-11-27 NOTE — Telephone Encounter (Signed)
Script called in

## 2011-11-27 NOTE — Telephone Encounter (Signed)
Call in a 6 month supply  

## 2012-04-18 ENCOUNTER — Encounter: Payer: Self-pay | Admitting: Family

## 2012-04-18 ENCOUNTER — Ambulatory Visit (INDEPENDENT_AMBULATORY_CARE_PROVIDER_SITE_OTHER): Admitting: Family

## 2012-04-18 VITALS — BP 130/80 | HR 77 | Temp 97.7°F

## 2012-04-18 DIAGNOSIS — M545 Low back pain: Secondary | ICD-10-CM

## 2012-04-18 DIAGNOSIS — G4700 Insomnia, unspecified: Secondary | ICD-10-CM

## 2012-04-18 DIAGNOSIS — L259 Unspecified contact dermatitis, unspecified cause: Secondary | ICD-10-CM

## 2012-04-18 MED ORDER — ESZOPICLONE 2 MG PO TABS: 2.0000 mg | ORAL_TABLET | Freq: Every day | ORAL | Status: DC

## 2012-04-18 MED ORDER — IBUPROFEN 800 MG PO TABS: 800.0000 mg | ORAL_TABLET | Freq: Three times a day (TID) | ORAL | Status: AC | PRN

## 2012-04-18 MED ORDER — METHYLPREDNISOLONE ACETATE 80 MG/ML IJ SUSP
80.0000 mg | Freq: Once | INTRAMUSCULAR | Status: AC
  Administered 2012-04-18: 80 mg via INTRAMUSCULAR

## 2012-04-18 NOTE — Patient Instructions (Signed)

## 2012-04-18 NOTE — Progress Notes (Signed)
Subjective:    Patient ID: Audrey Brown, female    DOB: 04-19-1946, 66 y.o.   MRN: [id number]  HPI 66 year old white female, nonsmoker, patient of Dr. Cato Mulligan is in today with complaints of a rash on her face, neck x2 days. She's unsure of any known allergen exposures. Denies any changes in detergents, soaps, lotions, shampooer conditioner. Does report eating [place] food on Friday.    Review of Systems  Constitutional: Negative.   Respiratory: Negative.   Cardiovascular: Negative.   Skin: Positive for rash.       Face, neck x 2 days. Red and mild itching  Hematological: Negative.   Psychiatric/Behavioral: Negative.    Past Medical History  Diagnosis Date  . Headache   . GERD (gastroesophageal reflux disease)   . corticobasilar degeneration     sees Dr. Zola Button at [facility]  . Osteoarthritis     has seen Dr Lestine Box  . Bipolar depression     had seen Dr. Tomasa Rand in past   . Dementia     History   Social History  . Marital Status: Widowed    Spouse Name: N/A    Number of Children: 4  . Years of Education: N/A   Occupational History  . Retired    Social History Main Topics  . Smoking status: Never Smoker   . Smokeless tobacco: Never Used  . Alcohol Use: No  . Drug Use: No  . Sexually Active: Not on file   Other Topics Concern  . Not on file   Social History Narrative   Daughter is her POA and must come to all visits in GI and [facility]     Past Surgical History  Procedure Date  . Tonsillectomy   . Cesarean section   . Cervical disc surgery 1994    c-6   . Wrist surgery 06-24-2010    Dr Rayburn Ma    Family History  Problem Relation Age of Onset  . Emphysema Mother   . Heart attack Father   . Hypertension    . Lung cancer      Allergies  Allergen Reactions  . Celecoxib     REACTION: hives  . Morphine And Related Hives  . Pe-Dexhlorphen-Pyril-Dm Other (See Comments)    INCREASES TREMORS   . Penicillins   . Statins Other (See Comments)      Muscle aches     Current Outpatient Prescriptions on File Prior to Visit  Medication Sig Dispense Refill  . buPROPion (WELLBUTRIN XL) 300 MG 24 hr tablet Take 300 mg by mouth daily.        . carbidopa-levodopa (SINEMET) 25-100 MG per tablet Take 2 tablets by mouth 3 (three) times daily.       Marland Kitchen esomeprazole (NEXIUM) 40 MG capsule Take 1 capsule (40 mg total) by mouth 2 (two) times daily.  60 capsule  3  . lamoTRIgine (LAMICTAL) 200 MG tablet Take 200 mg by mouth daily.        Marland Kitchen DISCONTD: eszopiclone (LUNESTA) 2 MG TABS Take 1 tablet (2 mg total) by mouth at bedtime. Take immediately before bedtime  30 tablet  5  . levothyroxine (SYNTHROID, LEVOTHROID) 25 MCG tablet Take 25 mcg by mouth daily.        . predniSONE (STERAPRED UNI-PAK) 10 MG tablet As directed for 12 days  48 tablet  0   No current facility-administered medications on file prior to visit.    BP 130/80  Pulse 77  Temp(Src) 97.7 F (  36.5 C) (Oral)chart    Objective:   Physical Exam  Constitutional: She is oriented to person, place, and time. She appears well-developed.  Cardiovascular: Normal rate, regular rhythm and normal heart sounds.   Pulmonary/Chest: Effort normal.  Neurological: She is alert and oriented to person, place, and time.  Skin: Skin is warm and dry.       Red, patchy, rash noted to the face, anterior and posterior neck. No drainage or discharge. Appears contact related  Psychiatric: She has a normal mood and affect.          Assessment & Plan:  Assessment: Contact dermatitis, Pruritic rash, Low back pain, insomnia  Plan: Renewed meds Ibuprofen, Lunesta. She will see her PCP in the next couple months. Patient will keep a diary of potential allergens. We will readdress if necessary.

## 2012-04-25 ENCOUNTER — Telehealth: Payer: Self-pay | Admitting: Family Medicine

## 2012-04-25 NOTE — Telephone Encounter (Signed)
Caller: Audrey Brown/Patient; PCP: [name].; CB#: [phone]; ; ; Call regarding Pharmacy Information;  Audrey Brown calling to provide [employer] Fax # [phone].  This regarding recent request for Nexium script to be sent to this pharmacy.

## 2012-04-25 NOTE — Telephone Encounter (Signed)
Caller: Kristel/Patient; PCP: [name].; CB#: [phone]; ; ; Call regarding Patient Was Seen in the Office Last Week and Forgot To Ask Dr. Clent Ridges To Start Filling Her Nexium 40 Mg BID 90 Day Supply With 3. Refills, With [facility] They Are Cheaper, Also She Needs This Done As Soon As Possible, Because She Is Going Out of [address] Next Week.; RN not able to reach pt but sent message taken from CSR

## 2012-04-27 MED ORDER — ESOMEPRAZOLE MAGNESIUM 40 MG PO CPDR: 40.0000 mg | DELAYED_RELEASE_CAPSULE | Freq: Two times a day (BID) | ORAL | Status: DC

## 2012-04-27 NOTE — Telephone Encounter (Signed)
I spoke with pt, called in a 30 day supply to local pharmacy and sent in a 90 day supply e-scribe to [facility].

## 2012-06-01 ENCOUNTER — Telehealth: Payer: Self-pay | Admitting: Family Medicine

## 2012-06-01 NOTE — Telephone Encounter (Signed)
Refill request for Lunesta 2 mg take 1 po qhs and last here on 04/18/12.

## 2012-06-01 NOTE — Telephone Encounter (Signed)
NO refills yet. I gave her a 3 month supply just 6 weeks ago.

## 2012-06-02 NOTE — Telephone Encounter (Signed)
I spoke with pt and pharmacy did not have order. I phoned in script that was written in May 2013 again today.

## 2012-06-03 ENCOUNTER — Ambulatory Visit (INDEPENDENT_AMBULATORY_CARE_PROVIDER_SITE_OTHER): Admitting: Family Medicine

## 2012-06-03 ENCOUNTER — Encounter: Payer: Self-pay | Admitting: Family Medicine

## 2012-06-03 VITALS — BP 126/74 | HR 69 | Temp 98.1°F

## 2012-06-03 DIAGNOSIS — G4700 Insomnia, unspecified: Secondary | ICD-10-CM

## 2012-06-03 DIAGNOSIS — R232 Flushing: Secondary | ICD-10-CM

## 2012-06-03 DIAGNOSIS — M549 Dorsalgia, unspecified: Secondary | ICD-10-CM

## 2012-06-03 MED ORDER — ESZOPICLONE 2 MG PO TABS: 2.0000 mg | ORAL_TABLET | Freq: Every day | ORAL | Status: DC

## 2012-06-03 NOTE — Progress Notes (Signed)
  Subjective:    Patient ID: Audrey Brown, female    DOB: 04-13-1946, 66 y.o.   MRN: [id number]  HPI Here for several issues. First she had had a reddish flushing on the face and chest when she gets out of the shower for the past 6 months. No discomfort or any other symptoms. The redness fades away in about 10-15 minutes. No recent changes in meds. She also needs her Lunesta refilled for her mail in pharmacy. Also she has been having more middle and lower back pain lately. We talked about seeing an orthopedist at [facility] last summer, but she got sick and went into the hospital at one point. After that we never did the referral. She wants to see him now.    Review of Systems  Constitutional: Negative.   Musculoskeletal: Positive for back pain.  Skin: Positive for color change.       Objective:   Physical Exam  Constitutional: She appears well-developed and well-nourished.       Alert, in no distress   Cardiovascular: Normal rate, regular rhythm, normal heart sounds and intact distal pulses.   Pulmonary/Chest: Effort normal and breath sounds normal.  Skin: Skin is warm and dry. No rash noted. No erythema.       They show me photos of her chest on her cell phone showing a widespread macular erythema           Assessment & Plan:  I reassured them that she is having benign flushing from the heat of the shower. No treatment is needed. Refilled her Lunesta. We will refer her to Dr. Jennefer Bravo for her back.

## 2012-07-04 ENCOUNTER — Encounter: Payer: Self-pay | Admitting: *Deleted

## 2012-07-05 ENCOUNTER — Ambulatory Visit: Admitting: Gastroenterology

## 2012-07-21 ENCOUNTER — Ambulatory Visit (INDEPENDENT_AMBULATORY_CARE_PROVIDER_SITE_OTHER): Admitting: Gastroenterology

## 2012-07-21 ENCOUNTER — Encounter: Payer: Self-pay | Admitting: Gastroenterology

## 2012-07-21 VITALS — BP 110/74 | HR 88 | Ht 61.25 in | Wt 174.5 lb

## 2012-07-21 DIAGNOSIS — R05 Cough: Secondary | ICD-10-CM

## 2012-07-21 DIAGNOSIS — R1310 Dysphagia, unspecified: Secondary | ICD-10-CM

## 2012-07-21 DIAGNOSIS — K219 Gastro-esophageal reflux disease without esophagitis: Secondary | ICD-10-CM

## 2012-07-21 DIAGNOSIS — R110 Nausea: Secondary | ICD-10-CM

## 2012-07-21 MED ORDER — ONDANSETRON HCL 4 MG PO TABS: 4.0000 mg | ORAL_TABLET | Freq: Three times a day (TID) | ORAL | Status: AC | PRN

## 2012-07-21 MED ORDER — ESOMEPRAZOLE MAGNESIUM 40 MG PO CPDR: 40.0000 mg | DELAYED_RELEASE_CAPSULE | Freq: Two times a day (BID) | ORAL | Status: DC

## 2012-07-21 NOTE — Patient Instructions (Addendum)
You have been scheduled for an endoscopy and colonoscopy with propofol. Please follow the written instructions given to you at your visit today. Please pick up your prep at the pharmacy within the next 1-3 days. If you use inhalers (even only as needed), please bring them with you on the day of your procedure. You have been scheduled for an abdominal ultrasound at [facility] (1st floor of hospital) on 07-26-12 at 8:30 am. Please arrive 15 minutes prior to your appointment for registration. Make certain not to have anything to eat or drink 6 hours prior to your appointment. Should you need to reschedule your appointment, please contact radiology at [phone]. We have sent the following medications to your pharmacy for you to pick up at your convenience: Nexium and Zofran.  CC: Gershon Crane, M. D.

## 2012-07-21 NOTE — Progress Notes (Signed)
This is a 66 year old Caucasian female with bipolar disorder and chronic basal ganglia disease followed by neurology at [facility]. I saw her 1 year ago because of dysphagia with a normal barium swallow. She had a normal endoscopy with empiric [facility] Dilation, and apparently had very excellent improvement in her dysphagia. She is an unreliable historian, and her nurse attendant is with her throughout the exam and interview. Patient is on twice a day Nexium 40 mg for acid reflux symptoms. Over the last several weeks she's had intermittent solid food dysphagia with some coughing. She previously was on anti-Parkinson medication which apparently has been discontinued. Her nurse attendant relates that her general medical status and neurologic function has greatly improved from one year ago. However, she continues with dementia and problems with short and long-term memory. Otherwise she is having no seizures or neuromuscular neurologic problems. Patient has refused colonoscopy in the past, but has regular bowel movements without melena or hematochezia.  Current Medications, Allergies, Past Medical History, Past Surgical History, Family History and Social History were reviewed in [employer] record.  Physical Exam: Healthy-appearing patient in no distress. Blood pressure 110/74, pulse 80 and regular, and weight 174 pounds with a BMI of 32.70. I cannot appreciate stigmata of chronic liver disease or thyromegaly. Her chest is clear and she appears to be in a regular rhythm without murmurs gallops or rubs. I cannot appreciate abdominal masses, organomegaly, or tenderness. She is in a wheelchair and I did not do complete neurologic exam or examination of the peripheral extremities or rectal exam.    Assessment and Plan: Recurrent solid food dysphagia in a 67 year old patient with stable dementia. Her symptoms are most consistent with chronic acid reflux and recurrent peptic strictures of  her esophagus, consider eosinophilic esophagitis. Since she had great improvement with her last dilation, I have rescheduled her endoscopy within. Dilations, also will obtain esophageal biopsies. Ultrasounds upper abdomen ordered because of her constant nausea which may be drug related or central neurologic origin, rule out cholelithiasis.Marland KitchenMarland Kitchen I have prescribed Zofran 4 mg every 8-12 hours as needed. Nexium 40 mg twice a day renewed. No diagnosis found.

## 2012-07-26 ENCOUNTER — Ambulatory Visit (HOSPITAL_COMMUNITY)
Admission: RE | Admit: 2012-07-26 | Discharge: 2012-07-26 | Disposition: A | Discharge status: Home / Self Care | Source: Ambulatory Visit | Attending: Gastroenterology | Admitting: Gastroenterology

## 2012-07-26 DIAGNOSIS — R110 Nausea: Secondary | ICD-10-CM | POA: Insufficient documentation

## 2012-07-26 DIAGNOSIS — K219 Gastro-esophageal reflux disease without esophagitis: Secondary | ICD-10-CM

## 2012-07-26 DIAGNOSIS — K8020 Calculus of gallbladder without cholecystitis without obstruction: Secondary | ICD-10-CM | POA: Insufficient documentation

## 2012-07-26 DIAGNOSIS — R1310 Dysphagia, unspecified: Secondary | ICD-10-CM | POA: Insufficient documentation

## 2012-07-27 ENCOUNTER — Encounter: Admitting: Gastroenterology

## 2012-07-27 ENCOUNTER — Ambulatory Visit (AMBULATORY_SURGERY_CENTER): Admitting: Gastroenterology

## 2012-07-27 VITALS — BP 142/92 | HR 80 | Temp 97.6°F | Resp 18 | Ht 61.25 in | Wt 174.0 lb

## 2012-07-27 DIAGNOSIS — K449 Diaphragmatic hernia without obstruction or gangrene: Secondary | ICD-10-CM

## 2012-07-27 DIAGNOSIS — D130 Benign neoplasm of esophagus: Secondary | ICD-10-CM

## 2012-07-27 DIAGNOSIS — K219 Gastro-esophageal reflux disease without esophagitis: Secondary | ICD-10-CM

## 2012-07-27 DIAGNOSIS — K8020 Calculus of gallbladder without cholecystitis without obstruction: Secondary | ICD-10-CM

## 2012-07-27 DIAGNOSIS — R1310 Dysphagia, unspecified: Secondary | ICD-10-CM

## 2012-07-27 DIAGNOSIS — R110 Nausea: Secondary | ICD-10-CM

## 2012-07-27 HISTORY — PX: ESOPHAGOGASTRODUODENOSCOPY (EGD) WITH ESOPHAGEAL DILATION: SHX5812

## 2012-07-27 HISTORY — DX: Diaphragmatic hernia without obstruction or gangrene: K44.9

## 2012-07-27 MED ORDER — SODIUM CHLORIDE 0.9 % IV SOLN: 500.0000 mL | INTRAVENOUS | Status: DC

## 2012-07-27 NOTE — Op Note (Signed)
[facility] [address] [place], [address]   ENDOSCOPY PROCEDURE REPORT  PATIENT: Audrey, Brown  MR#: [id number] BIRTHDATE: [birth year] , 65  yrs. old GENDER: Female ENDOSCOPIST:Ellyson Rarick Hale Bogus, MD, Clementeen Graham REFERRED BY: Gershon Crane, M.D. PROCEDURE DATE:  07/27/2012 PROCEDURE:   EGD w/ biopsy and Maloney dilation of esophagus ASA CLASS:    Class III INDICATIONS: dyspepsia, dysphagia, gallstones, and nausea. MEDICATION: propofol (Diprivan) 100mg  IV TOPICAL ANESTHETIC:   Cetacaine Spray  DESCRIPTION OF PROCEDURE:   After the risks and benefits of the procedure were explained, informed consent was obtained.  The LB GIF-H180 [id number]  endoscope was introduced through the mouth  and advanced to the second portion of the duodenum .  The instrument was slowly withdrawn as the mucosa was fully examined.      DUODENUM: The duodenal mucosa showed no abnormalities in the bulb and second portion of the duodenum.  STOMACH: The mucosa of the stomach appeared normal.  A biopsy was performed.  Multiple biopsies were taken in the distal and mid esophagus to rule out eosinophilic esophagitis.   A hiatus hernia was found.  5 cm.  HH noted..no stricture noted,dilated #68F Maloney passed ,no heme or pain.Biopsies done for EE. CLO bx. also done.   Retroflexed views revealed a hiatal hernia.    The scope was then withdrawn from the patient and the procedure completed.  COMPLICATIONS: There were no complications.   ENDOSCOPIC IMPRESSION: 1.   The duodenal mucosa showed no abnormalities in the bulb and second portion of the duodenum 2.   The mucosa of the stomach appeared normal; biopsy; multiple biopsies were taken in the distal and mid esophagus to rule out eosinophilic esophagitis.CLO bx. done. 3.   5 cm.  HH noted..no stricture noted,dilated #68F Maloney passed ,no heme or pain.Biopsies done for EE. 4.  Gallstones on ultrasound.Marland KitchenMarland Kitchen??? cause of  nausea. RECOMMENDATIONS: 1.  Await pathology results 2.  continue current medications 3.  Rx CLO if positive 4.  My office will arrange for you to meet a surgeon.    _______________________________ eSignedMardella Layman, MD, [facility] 07/27/2012 4:09 PM   standard discharge

## 2012-07-27 NOTE — Patient Instructions (Addendum)
Await biopsy results.  Please follow the dilatation diet the rest of the day.  Per Dr. Jarold Motto continue your current medication and you may resume them today.  The 3rd floor nurse will call to set you up an appointment with a surgeon.  Please call if any questions or concerns.    YOU HAD AN ENDOSCOPIC PROCEDURE TODAY AT THE [facility]: Refer to the procedure report that was given to you for any specific questions about what was found during the examination.  If the procedure report does not answer your questions, please call your gastroenterologist to clarify.  If you requested that your care partner not be given the details of your procedure findings, then the procedure report has been included in a sealed envelope for you to review at your convenience later.  YOU SHOULD EXPECT: Some feelings of bloating in the abdomen. Passage of more gas than usual.  Walking can help get rid of the air that was put into your GI tract during the procedure and reduce the bloating. If you had a lower endoscopy (such as a colonoscopy or flexible sigmoidoscopy) you may notice spotting of blood in your stool or on the toilet paper. If you underwent a bowel prep for your procedure, then you may not have a normal bowel movement for a few days.  DIET:    Drink plenty of fluids but you should avoid alcoholic beverages for 24 hours.  Please follow the dilatation diet the rest of the day.   ACTIVITY: Your care partner should take you home directly after the procedure.  You should plan to take it easy, moving slowly for the rest of the day.  You can resume normal activity the day after the procedure however you should NOT DRIVE or use heavy machinery for 24 hours (because of the sedation medicines used during the test).    SYMPTOMS TO REPORT IMMEDIATELY: A gastroenterologist can be reached at any hour.  During normal business hours, 8:30 AM to 5:00 PM Monday through Friday, call [phone].  After hours and  on weekends, please call the GI answering service at [phone] who will take a message and have the physician on call contact you.   Following upper endoscopy (EGD)  Vomiting of blood or coffee ground material  New chest pain or pain under the shoulder blades  Painful or persistently difficult swallowing  New shortness of breath  Fever of 100F or higher  Black, tarry-looking stools  FOLLOW UP: If any biopsies were taken you will be contacted by phone or by letter within the next 1-3 weeks.  Call your gastroenterologist if you have not heard about the biopsies in 3 weeks.  Our staff will call the home number listed on your records the next business day following your procedure to check on you and address any questions or concerns that you may have at that time regarding the information given to you following your procedure. This is a courtesy call and so if there is no answer at the home number and we have not heard from you through the emergency physician on call, we will assume that you have returned to your regular daily activities without incident.  SIGNATURES/CONFIDENTIALITY: You and/or your care partner have signed paperwork which will be entered into your electronic medical record.  These signatures attest to the fact that that the information above on your After Visit Summary has been reviewed and is understood.  Full responsibility of the confidentiality of this discharge  information lies with you and/or your care-partner.

## 2012-07-27 NOTE — Progress Notes (Signed)
No complaints noted in the recovery room. Maw  Patient did not experience any of the following events: a burn prior to discharge; a fall within the facility; wrong site/side/patient/procedure/implant event; or a hospital transfer or hospital admission upon discharge from the facility. (G8907) Patient did not have preoperative order for IV antibiotic SSI prophylaxis. (G8918)  

## 2012-07-28 ENCOUNTER — Telehealth: Payer: Self-pay | Admitting: Gastroenterology

## 2012-07-28 ENCOUNTER — Other Ambulatory Visit: Payer: Self-pay | Admitting: *Deleted

## 2012-07-28 ENCOUNTER — Telehealth: Payer: Self-pay

## 2012-07-28 LAB — HELICOBACTER PYLORI SCREEN-BIOPSY: UREASE: NEGATIVE

## 2012-07-28 MED ORDER — ESOMEPRAZOLE MAGNESIUM 40 MG PO CPDR: 40.0000 mg | DELAYED_RELEASE_CAPSULE | Freq: Every day | ORAL | Status: DC

## 2012-07-28 NOTE — Telephone Encounter (Signed)
  Follow up Call-  Call back number 07/27/2012 06/26/2011  Post procedure Call Back phone  # [phone]  Permission to leave phone message Yes -     Patient questions:  Do you have a fever, pain , or abdominal swelling? no Pain Score  0 *  Have you tolerated food without any problems? yes  Have you been able to return to your normal activities? yes  Do you have any questions about your discharge instructions: Diet   no Medications  no Follow up visit  no  Do you have questions or concerns about your Care? no  Actions: * If pain score is 4 or above: No action needed, pain <4.

## 2012-07-28 NOTE — Telephone Encounter (Signed)
Pt wants Nexium script sent to [facility]; done and cancelled order to [facility].

## 2012-08-01 ENCOUNTER — Encounter: Payer: Self-pay | Admitting: Gastroenterology

## 2012-08-02 ENCOUNTER — Telehealth: Payer: Self-pay | Admitting: *Deleted

## 2012-08-02 ENCOUNTER — Encounter: Payer: Self-pay | Admitting: Gastroenterology

## 2012-08-02 NOTE — Telephone Encounter (Signed)
Message copied by Florene Glen on Tue Aug 02, 2012  4:18 PM ------      Message from: Mardella Layman      Created: Tue Jul 26, 2012 12:46 PM       Please call this patient. She has chronic nausea, and ultrasound reviewed gallstones. You'll need to speak to her nurse/attendant . I would recommend surgical consultation for possible laparoscopic cholecystectomy.

## 2012-08-02 NOTE — Telephone Encounter (Signed)
Spoke with pt and her attendant about a referral. Pt is OK with that and has no preference in a Careers adviser. Her attendant, Angelique Blonder states morning appt are better, because she works from [employer]. Pt has a memory problem so her attendant needs to be there.

## 2012-08-03 NOTE — Telephone Encounter (Signed)
Patient is scheduled to see Dr. Lodema Pilot on 08/17/12 @ 9:15am, arrive @ 8:45am. If you have any questions please call [phone].  Informed pt of appt.

## 2012-08-17 ENCOUNTER — Encounter (INDEPENDENT_AMBULATORY_CARE_PROVIDER_SITE_OTHER): Payer: Self-pay | Admitting: General Surgery

## 2012-08-17 ENCOUNTER — Ambulatory Visit (INDEPENDENT_AMBULATORY_CARE_PROVIDER_SITE_OTHER): Admitting: General Surgery

## 2012-08-17 VITALS — BP 132/86 | HR 74 | Temp 97.6°F | Ht 61.0 in | Wt 160.4 lb

## 2012-08-17 DIAGNOSIS — R110 Nausea: Secondary | ICD-10-CM

## 2012-08-17 DIAGNOSIS — K8020 Calculus of gallbladder without cholecystitis without obstruction: Secondary | ICD-10-CM

## 2012-08-17 NOTE — Progress Notes (Signed)
Patient ID: Audrey Brown, female   DOB: 10-19-1946, 66 y.o.   MRN: [id number]  Chief Complaint  Patient presents with  . Pre-op Exam    eval GB with stones    HPI Audrey Brown is a 66 y.o. female. This patient is referred to Dr. Clent Ridges for evaluation of nausea and cholelithiasis. She has a history of neurologic degeneration and is treated at [facility] for this. She recently underwent upper endoscopy with dilation which did improve her swallowing symptoms but she continues to have dysphagia and choking as well as "indigestion and feeling like I ate too much". She has daily nausea and frequent emesis and has been taking Zofran about every other day which does improve her symptoms. She says her bowels are normally constipated without any blood in the stool or melena. She denies any abdominal pain or fevers or chills or other associated symptoms. She is taking Nexium as well without improvement in her symptoms. She did have an ultrasound of the abdomen which demonstrated cholelithiasis and she was referred for surgical consultation. HPI  Past Medical History  Diagnosis Date  . Headache   . GERD (gastroesophageal reflux disease)   . corticobasilar degeneration     sees Dr. Zola Button at [facility]  . Osteoarthritis     has seen Dr Lestine Box  . Bipolar depression     had seen Dr. Tomasa Rand in past   . Dementia   . Neuromuscular disorder     Past Surgical History  Procedure Date  . Tonsillectomy   . Cesarean section   . Cervical disc surgery 1994    c-6   . Wrist surgery 06-24-2010    Dr Rayburn Ma right wrist  . Neck surgery     Lumpectomy  . Foot neuroma surgery     Family History  Problem Relation Age of Onset  . Emphysema Mother   . Heart attack Father   . Hypertension    . Lung cancer      Social History History  Substance Use Topics  . Smoking status: Never Smoker   . Smokeless tobacco: Never Used  . Alcohol Use: No    Allergies  Allergen Reactions  .  Celecoxib     REACTION: hives  . Morphine And Related Hives  . Pe-Dexhlorphen-Pyril-Dm Other (See Comments)    INCREASES TREMORS   . Penicillins   . Statins Other (See Comments)    Muscle aches     Current Outpatient Prescriptions  Medication Sig Dispense Refill  . buPROPion (WELLBUTRIN XL) 300 MG 24 hr tablet Take 300 mg by mouth daily.        Marland Kitchen esomeprazole (NEXIUM) 40 MG capsule Take 1 capsule (40 mg total) by mouth daily before breakfast.  180 capsule  3  . eszopiclone (LUNESTA) 2 MG TABS Take 1 tablet (2 mg total) by mouth at bedtime. Take immediately before bedtime  90 tablet  1  . ibuprofen (ADVIL,MOTRIN) 800 MG tablet Take 800 mg by mouth as needed.      . lamoTRIgine (LAMICTAL) 200 MG tablet Take 200 mg by mouth daily.        . Multiple Vitamin (MULTIVITAMIN) tablet Take 1 tablet by mouth daily.      Marland Kitchen oxycodone-acetaminophen (MAGNECET) 2.5-400 MG per tablet Take 1 tablet by mouth as needed.        Review of Systems Review of Systems All other review of systems negative or noncontributory except as stated in the HPI  Blood pressure 132/86,  pulse 74, temperature 97.6 F (36.4 C), temperature source Temporal, height 5\' 1"  (1.549 m), weight 160 lb 6.4 oz (72.757 kg), SpO2 98.00%.  Physical Exam Physical Exam Physical Exam  Nursing note and vitals reviewed. Constitutional: She is oriented to person, place, and time. She appears well-developed and well-nourished. No distress.  HENT:  Head: Normocephalic and atraumatic.  Mouth/Throat: No oropharyngeal exudate.  Eyes: Conjunctivae and EOM are normal. Pupils are equal, round, and reactive to light. Right eye exhibits no discharge. Left eye exhibits no discharge. No scleral icterus.  Neck: Normal range of motion. Neck supple. No tracheal deviation present.  Cardiovascular: Normal rate, regular rhythm, normal heart sounds and intact distal pulses.   Pulmonary/Chest: Effort normal and breath sounds normal. No stridor. No  respiratory distress. She has no wheezes.  Abdominal: Soft. Bowel sounds are normal. She exhibits no distension and no mass. There is no tenderness. There is no rebound and no guarding.  Musculoskeletal: Normal range of motion. She exhibits no edema and no tenderness.  Neurological: She is alert and oriented to person, place, and time. She does have a tremor Skin: Skin is warm and dry. No rash noted. She is not diaphoretic. No erythema. No pallor.  Psychiatric: She has a normal mood and affect. Her behavior is normal. Judgment and thought content normal.    Data Reviewed EGD and [place]  Assessment    Cholelithiasis and nausea She does have cholelithiasis on ultrasound without any evidence of acute cholecystitis. She is without any pain but she does have any nausea and frequent vomiting. It is really uncertain if her symptoms are related to the gallstones and they certainly could be however this is not certain. She has symptoms of feeling full and frequent nausea and given her neurologic disorder, she could have gastroparesis as well. I discussed with her the options for surgery to without any guarantees of improvement of her symptoms versus further workup with gastric emptying study and I think that it be best to proceed with gastric emptying study prior to surgery. If this is positive for gastroparesis, and then we will trial other therapies directed for this prior to surgery to remove her gallbladder. If it is negative then we will still be faced with the same decision of whether to proceed with cholecystectomy with the hope that this would improve her symptoms    Plan    We will set her up for gastric emptying studies and she will follow up with me after this.       Lodema Pilot DAVID 08/17/2012, 9:32 AM

## 2012-08-31 ENCOUNTER — Encounter (HOSPITAL_COMMUNITY): Payer: Self-pay

## 2012-08-31 ENCOUNTER — Encounter (HOSPITAL_COMMUNITY)
Admission: RE | Admit: 2012-08-31 | Discharge: 2012-08-31 | Disposition: A | Discharge status: Home / Self Care | Source: Ambulatory Visit | Attending: General Surgery | Admitting: General Surgery

## 2012-08-31 DIAGNOSIS — K3189 Other diseases of stomach and duodenum: Secondary | ICD-10-CM | POA: Insufficient documentation

## 2012-08-31 DIAGNOSIS — R110 Nausea: Secondary | ICD-10-CM | POA: Insufficient documentation

## 2012-08-31 MED ORDER — TECHNETIUM TC 99M SULFUR COLLOID
2.0000 | Freq: Once | INTRAVENOUS | Status: AC | PRN
  Administered 2012-08-31: 2 via INTRAVENOUS

## 2012-09-07 ENCOUNTER — Telehealth (INDEPENDENT_AMBULATORY_CARE_PROVIDER_SITE_OTHER): Payer: Self-pay

## 2012-09-07 NOTE — Telephone Encounter (Addendum)
Discussed Gastric Emptying Study results given to patient.  Patient need's to follow up with Dr. Jarold Motto (Gastroenterology) for Gastroparesis, we will schedule a follow up appointment after patient has seen Dr. Jarold Motto.

## 2012-09-19 ENCOUNTER — Encounter: Payer: Self-pay | Admitting: *Deleted

## 2012-09-19 NOTE — Telephone Encounter (Signed)
Patient scheduled to see Dr. Jarold Motto @ [facility] on 09/23/12 @ 10:30 am.

## 2012-09-23 ENCOUNTER — Encounter: Payer: Self-pay | Admitting: Gastroenterology

## 2012-09-23 ENCOUNTER — Ambulatory Visit (INDEPENDENT_AMBULATORY_CARE_PROVIDER_SITE_OTHER): Payer: TRICARE For Life (TFL) | Admitting: Gastroenterology

## 2012-09-23 VITALS — BP 120/80 | HR 85 | Ht 62.0 in | Wt 160.0 lb

## 2012-09-23 DIAGNOSIS — G20 Parkinson's disease: Secondary | ICD-10-CM

## 2012-09-23 DIAGNOSIS — K8020 Calculus of gallbladder without cholecystitis without obstruction: Secondary | ICD-10-CM

## 2012-09-23 DIAGNOSIS — K3184 Gastroparesis: Secondary | ICD-10-CM

## 2012-09-23 DIAGNOSIS — K219 Gastro-esophageal reflux disease without esophagitis: Secondary | ICD-10-CM

## 2012-09-23 DIAGNOSIS — R110 Nausea: Secondary | ICD-10-CM

## 2012-09-23 DIAGNOSIS — F319 Bipolar disorder, unspecified: Secondary | ICD-10-CM

## 2012-09-23 MED ORDER — AMBULATORY NON FORMULARY MEDICATION: Status: DC

## 2012-09-23 NOTE — Progress Notes (Signed)
This is a complex 66 year old Caucasian female with bipolar disorder and some unexplained neurologic movement disorder.  She is disabled and in a wheelchair and has a chronic attendant.  She has chronic nausea, and GI workup so far has been unremarkable except for evidence of delayed gastric emptying on gastric emptying scan.  Also ultrasound showed a single gallstone, but the patient has had no abnormal liver function test were episodes of biliary colic or known liver disease.  He previously was on oxycodone which apparently she is only using very very irregularly, also irregular Advil 800 mg.  She has been maintained on Nexium 40 mg daily, and continues with acid reflux.  Endoscopy recently was unremarkable except for hiatal hernia and some distal esophagitis with negative biopsies for other causes of esophagitis or Barrett's mucosa.  Despite these complaints she's maintain her weight at a normal level and denies any specific hepatobiliary or systemic complaints.  Her swallowing has been much improved since her endoscopy and appeared dilations.  Surgical consultation is been achieved, and Dr.Layton did not feel that cholecystectomy would help this patient.  Current Medications, Allergies, Past Medical History, Past Surgical History, Family History and Social History were reviewed in [employer] record.  Pertinent Review of Systems Negative   Physical Exam: LT appearing patient in no acute distress.  I cannot appreciate stigmata of chronic liver disease.  Blood pressure 120/80, pulse 85 and regular, and weight 160 with a BMI of 29.62.  She is wheelchair-bound, and I was unable to obtain an adequate examination, but she had no evidence of abdominal masses or tenderness in a sitting position.  Mental status was normal.  Throughout her interview and exam her nurse attendant was present.    Assessment and Plan: Unusual case in a difficult patient because of her vague neurological  disabilities and psychiatric bipolar disorder.  She may have significant gastroparesis, and we will try a gastroparesis diet along with domperidone 10 mg 3 times a day before meals and at bedtime if needed.  I've asked continue to take her Nexium on a regular basis, and she is to call in one month's time for progress report.  She receives her neurologic care at [facility], and if she does not respond we will refer her to Dr. Alycia Rossetti in the motility section of the [facility] gastroenterology division.  I've advised her avoid NSAIDs and narcotics if at all possible.  She is not a candidate for Reglan therapy because of her neurological situation, but hopefully she will be able to tolerate domperidone since it does not cross the blood-brain barrier.  Please copy this note to Dr. Biagio Quint and her primary care physician No diagnosis found.

## 2012-09-23 NOTE — Patient Instructions (Addendum)
Gastroparesis  Gastroparesis is also called slowed stomach emptying (delayed gastric emptying). It is a condition in which the stomach takes too long to empty its contents. It often happens in people with diabetes.  CAUSES  Gastroparesis happens when nerves to the stomach are damaged or stop working. When the nerves are damaged, the muscles of the stomach and intestines do not work normally. The movement of food is slowed or stopped. High blood glucose (sugar) causes changes in nerves and can damage the blood vessels that carry oxygen and nutrients to the nerves. RISK FACTORS  Diabetes.  Post-viral syndromes.  Eating disorders (anorexia, bulimia).  Surgery on the stomach or vagus nerve.  Gastroesophageal reflux disease (rarely).  Smooth muscle disorders (amyloidosis, scleroderma).  Metabolic disorders, including hypothyroidism.  Parkinson's disease. SYMPTOMS   Heartburn.  Feeling sick to your stomach (nausea).  Vomiting of undigested food.  An early feeling of fullness when eating.  Weight loss.  Abdominal bloating.  Erratic blood glucose levels.  Lack of appetite.  Gastroesophageal reflux.  Spasms of the stomach wall. Complications can include:  Bacterial overgrowth in stomach. Food stays in the stomach and can ferment and cause bacteria to grow.  Weight loss due to difficulty digesting and absorbing nutrients.  Vomiting.  Obstruction in the stomach. Undigested food can harden and cause nausea and vomiting.  Blood glucose fluctuations caused by inconsistent food absorption. DIAGNOSIS  The diagnosis of gastroparesis is confirmed through one or more of the following tests:  Barium X-rays and scans. These tests look at how long it takes for food to move through the stomach.  Gastric manometry. This test measures electrical and muscular activity in the stomach. A thin tube is passed down the throat into the stomach. The tube contains a wire that takes  measurements of the stomach's electrical and muscular activity as it digests liquids and solid food.  Endoscopy. This procedure is done with a long, thin tube called an endoscope. It is passed through the mouth and gently guides down the esophagus into the stomach. This tube helps the caregiver look at the lining of the stomach to check for any abnormalities.  Ultrasound. This can rule out gallbladder disease or pancreatitis. This test will outline and define the shape of the gallbladder and pancreas. TREATMENT   The primary treatment is to identify the problem and help control blood glucose levels. Treatments include:  Exercise.  Medicines to control nausea and vomiting.  Medicines to stimulate stomach muscles.  Changes in what and when you eat.  Having smaller meals more often.  Eating low-fiber forms of high-fiber foods, such aseating cooked vegetables instead of raw vegetables.  Eating low-fat foods.  Consuming liquids, which are easier to digest.  In severe cases, feeding tubes and intravenous (IV) feeding may be needed. It is important to note that in most cases, treatment does not cure gastroparesis. It is usually a lasting (chronic) condition. Treatment helps you manage the condition so that you can be as healthy and comfortable as possible. NEW TREATMENTS  A gastric neurostimulator has been developed to assist people with gastroparesis. The battery-operated device is surgically implanted. It emits mild electrical pulses to help improve stomach emptying and to control nausea and vomiting.  The use of botulinum toxin has been shown to improve stomach emptying by decreasing the prolonged contractions of the muscle between the stomach and the small intestine (pyloric sphincter). The benefits are temporary. SEEK MEDICAL CARE IF:   You are having problems keeping your blood glucose  in goal range.  You are having nausea, vomiting, bloating, or early feelings of fullness with  eating.  Your symptoms do not change with a change in diet. Document Released: 10/26/2005 Document Revised: 01/18/2012 Document Reviewed: 04/04/2009 ExitCare Patient Information 2013 ExitCare, [place].  __________________________________________________________________________________________________________________  Gastroparesis diet given today as well  We have sent Domperidone 10 mg to [facility] for you to take one tablet by mouth three times daily 30 minutes before meals. Their number is [phone] if you have not received the medication in a couple of weeks     Please make follow up appointment with Dr. Jarold Motto in one month

## 2012-09-28 ENCOUNTER — Other Ambulatory Visit: Payer: Self-pay | Admitting: Family Medicine

## 2012-10-25 ENCOUNTER — Encounter: Payer: Self-pay | Admitting: Gastroenterology

## 2012-10-25 ENCOUNTER — Ambulatory Visit (INDEPENDENT_AMBULATORY_CARE_PROVIDER_SITE_OTHER): Admitting: Gastroenterology

## 2012-10-25 VITALS — BP 118/82 | HR 83 | Ht 62.0 in | Wt 160.0 lb

## 2012-10-25 DIAGNOSIS — R1013 Epigastric pain: Secondary | ICD-10-CM

## 2012-10-25 DIAGNOSIS — K219 Gastro-esophageal reflux disease without esophagitis: Secondary | ICD-10-CM

## 2012-10-25 DIAGNOSIS — K3184 Gastroparesis: Secondary | ICD-10-CM

## 2012-10-25 MED ORDER — ESOMEPRAZOLE MAGNESIUM 40 MG PO CPDR: 40.0000 mg | DELAYED_RELEASE_CAPSULE | Freq: Two times a day (BID) | ORAL | Status: DC

## 2012-10-25 MED ORDER — AMBULATORY NON FORMULARY MEDICATION: Status: DC

## 2012-10-25 NOTE — Progress Notes (Signed)
This is a 66 year old Caucasian female with chronic neurological dysfunction, Parkinson's syndrome type difficulties.  She has had symptoms of gastroparesis confirmed by recent technetium gastric emptying scan where 60% of food retained in her stomach at 2 hours.  She is begun domperidone 10 mg 3 times a day and continues on Nexium 40 mg every morning.  She continues with some early satiety and nausea but is doing somewhat better.  There is no history of emesis, hepatobiliary or lower gastrointestinal problems.  Current Medications, Allergies, Past Medical History, Past Surgical History, Family History and Social History were reviewed in [employer] record.  Pertinent Review of Systems Negative... no advancement of her neurological problems.   Physical Exam: Blood pressure 118/82, pulse 83 and regular, and weight 160 with a BMI of 29.2 698% oxygen saturation on room air.  Abdominal exam shows no organomegaly, masses, or tenderness.  Mental status is normal.    Assessment and Plan: Gastroparesis questionable etiology with secondary acid reflux.  We will continue her current regime but add domperidone 10 mg at bedtime.  I will see her back in 6 weeks' time for followup.  She is to continue other medications as listed and reviewed. No diagnosis found.

## 2012-10-25 NOTE — Patient Instructions (Addendum)
Please make follow up appointment in six weeks with Dr. Jarold Motto.  Please take Nexium twice daily.  Please increase Domperadone one capsule four times daily with meals and one at bedtime.

## 2012-10-31 ENCOUNTER — Other Ambulatory Visit: Payer: Self-pay | Admitting: *Deleted

## 2012-10-31 MED ORDER — ESOMEPRAZOLE MAGNESIUM 40 MG PO CPDR: 40.0000 mg | DELAYED_RELEASE_CAPSULE | Freq: Two times a day (BID) | ORAL | Status: DC

## 2012-12-06 ENCOUNTER — Encounter: Payer: Self-pay | Admitting: Gastroenterology

## 2012-12-06 ENCOUNTER — Ambulatory Visit (INDEPENDENT_AMBULATORY_CARE_PROVIDER_SITE_OTHER): Admitting: Gastroenterology

## 2012-12-06 VITALS — BP 116/74 | HR 73

## 2012-12-06 DIAGNOSIS — K8020 Calculus of gallbladder without cholecystitis without obstruction: Secondary | ICD-10-CM

## 2012-12-06 DIAGNOSIS — K219 Gastro-esophageal reflux disease without esophagitis: Secondary | ICD-10-CM

## 2012-12-06 DIAGNOSIS — K3184 Gastroparesis: Secondary | ICD-10-CM

## 2012-12-06 MED ORDER — ESOMEPRAZOLE MAGNESIUM 40 MG PO CPDR
40.0000 mg | DELAYED_RELEASE_CAPSULE | Freq: Two times a day (BID) | ORAL | Status: DC
Start: 2012-12-06 — End: 2012-12-16

## 2012-12-06 MED ORDER — AMBULATORY NON FORMULARY MEDICATION: Status: DC

## 2012-12-06 NOTE — Patient Instructions (Addendum)
Nexium and Domperidone will be refilled at your request today. Please take as directed   Gastroparesis diet given today, please follow stage 3   Please follow up in one year

## 2012-12-06 NOTE — Progress Notes (Signed)
This is a 67 year old Caucasian female who has a Parkinson's-like neurologic progressive illness and brought in her basal ganglia.  She seems to be worsening with her neurological dysfunction, and is followed closely by neurology.  I've been seen her about recurrent long-term nausea, and recent technetium gastric emptying scan showed 70% retention at 2 hours.  She subsequently was placed on domperidone 10 mg 3 times a day before meals and at bedtime with good response.  She also takes Nexium 40 mg a day for acid reflux.  Additionally the patient has not been asymptomatic gallstones, but has not had previous episodes of biliary colic or other hepatobiliary symptoms.  She denies any new neurological symptoms related to her domperidone.  She does follow a modified gastroparesis diet.  Her appetite is good her weight is stable, she's had no emesis, or lower bowel problems.  Current Medications, Allergies, Past Medical History, Past Surgical History, Family History and Social History were reviewed in [employer] record.  ROS: All systems were reviewed and are negative unless otherwise stated in the HPI .          Physical Exam: Repair patient in no distress.  I cannot appreciate stigmata of chronic liver disease.  Blood pressure 160/74, pulse 73 and regular, and weight not obtained because of her wheelchair status.  Her abdomen shows no organomegaly, masses, or tenderness.  Bowel sounds are normal.  Patient appears healthy and is nontoxic.  Mental status is normal.  She has obvious resting tremor.    Assessment and Plan: Idiopathic gastroparesis doing better with prokinetic therapy.  Her domperidone should not cross the blood-brain barrier to get her neurological difficulties, and she seems to be doing better on this medication.  I have renewed her domperidone to Nexium prescriptions and we'll see her every several months or as needed.  Please copy her primary care and  neurologist.. No diagnosis found.

## 2012-12-16 ENCOUNTER — Emergency Department (HOSPITAL_COMMUNITY)

## 2012-12-16 ENCOUNTER — Encounter (HOSPITAL_COMMUNITY): Payer: Self-pay | Admitting: Emergency Medicine

## 2012-12-16 ENCOUNTER — Telehealth: Payer: Self-pay | Admitting: Gastroenterology

## 2012-12-16 ENCOUNTER — Emergency Department (HOSPITAL_COMMUNITY)
Admission: EM | Admit: 2012-12-16 | Discharge: 2012-12-16 | Disposition: A | Discharge status: Home / Self Care | Attending: Emergency Medicine | Admitting: Emergency Medicine

## 2012-12-16 DIAGNOSIS — Z9889 Other specified postprocedural states: Secondary | ICD-10-CM | POA: Insufficient documentation

## 2012-12-16 DIAGNOSIS — Z8739 Personal history of other diseases of the musculoskeletal system and connective tissue: Secondary | ICD-10-CM | POA: Insufficient documentation

## 2012-12-16 DIAGNOSIS — K219 Gastro-esophageal reflux disease without esophagitis: Secondary | ICD-10-CM | POA: Insufficient documentation

## 2012-12-16 DIAGNOSIS — F0390 Unspecified dementia without behavioral disturbance: Secondary | ICD-10-CM | POA: Insufficient documentation

## 2012-12-16 DIAGNOSIS — R141 Gas pain: Secondary | ICD-10-CM | POA: Insufficient documentation

## 2012-12-16 DIAGNOSIS — Z8719 Personal history of other diseases of the digestive system: Secondary | ICD-10-CM | POA: Insufficient documentation

## 2012-12-16 DIAGNOSIS — R142 Eructation: Secondary | ICD-10-CM | POA: Insufficient documentation

## 2012-12-16 DIAGNOSIS — Z981 Arthrodesis status: Secondary | ICD-10-CM | POA: Insufficient documentation

## 2012-12-16 DIAGNOSIS — Z8669 Personal history of other diseases of the nervous system and sense organs: Secondary | ICD-10-CM | POA: Insufficient documentation

## 2012-12-16 DIAGNOSIS — Z79899 Other long term (current) drug therapy: Secondary | ICD-10-CM | POA: Insufficient documentation

## 2012-12-16 DIAGNOSIS — R1013 Epigastric pain: Secondary | ICD-10-CM

## 2012-12-16 DIAGNOSIS — F3130 Bipolar disorder, current episode depressed, mild or moderate severity, unspecified: Secondary | ICD-10-CM | POA: Insufficient documentation

## 2012-12-16 DIAGNOSIS — K8020 Calculus of gallbladder without cholecystitis without obstruction: Secondary | ICD-10-CM

## 2012-12-16 DIAGNOSIS — R509 Fever, unspecified: Secondary | ICD-10-CM | POA: Insufficient documentation

## 2012-12-16 DIAGNOSIS — R112 Nausea with vomiting, unspecified: Secondary | ICD-10-CM | POA: Insufficient documentation

## 2012-12-16 LAB — COMPREHENSIVE METABOLIC PANEL WITH GFR
ALT: 20 U/L (ref 0–35)
AST: 18 U/L (ref 0–37)
Albumin: 3.6 g/dL (ref 3.5–5.2)
Alkaline Phosphatase: 67 U/L (ref 39–117)
BUN: 8 mg/dL (ref 6–23)
CO2: 28 meq/L (ref 19–32)
Calcium: 9 mg/dL (ref 8.4–10.5)
Chloride: 106 meq/L (ref 96–112)
Creatinine, Ser: 0.63 mg/dL (ref 0.50–1.10)
GFR calc Af Amer: >90 mL/min
GFR calc non Af Amer: >90 mL/min
Glucose, Bld: 91 mg/dL (ref 70–99)
Potassium: 3.5 meq/L (ref 3.5–5.1)
Sodium: 144 meq/L (ref 135–145)
Total Bilirubin: 0.3 mg/dL (ref 0.3–1.2)
Total Protein: 6.5 g/dL (ref 6.0–8.3)

## 2012-12-16 LAB — CBC WITH DIFFERENTIAL/PLATELET
Basophils Absolute: 0.1 K/uL (ref 0.0–0.1)
Basophils Relative: 1 % (ref 0–1)
Eosinophils Absolute: 0.2 K/uL (ref 0.0–0.7)
Eosinophils Relative: 2 % (ref 0–5)
HCT: 40.7 % (ref 36.0–46.0)
Hemoglobin: 13.4 g/dL (ref 12.0–15.0)
Lymphocytes Relative: 46 % (ref 12–46)
Lymphs Abs: 4.1 K/uL — ABNORMAL HIGH (ref 0.7–4.0)
MCH: 29.3 pg (ref 26.0–34.0)
MCHC: 32.9 g/dL (ref 30.0–36.0)
MCV: 88.9 fL (ref 78.0–100.0)
Monocytes Absolute: 0.6 K/uL (ref 0.1–1.0)
Monocytes Relative: 7 % (ref 3–12)
Neutro Abs: 4 K/uL (ref 1.7–7.7)
Neutrophils Relative %: 44 % (ref 43–77)
Platelets: 302 K/uL (ref 150–400)
RBC: 4.58 MIL/uL (ref 3.87–5.11)
RDW: 13 % (ref 11.5–15.5)
WBC: 9 K/uL (ref 4.0–10.5)

## 2012-12-16 LAB — LIPASE, BLOOD: Lipase: 31 U/L (ref 11–59)

## 2012-12-16 MED ORDER — SODIUM CHLORIDE 0.9 % IV BOLUS (SEPSIS)
1000.0000 mL | Freq: Once | INTRAVENOUS | Status: AC
  Administered 2012-12-16: 1000 mL via INTRAVENOUS

## 2012-12-16 MED ORDER — HYDROCODONE-ACETAMINOPHEN 5-325 MG PO TABS: 1.0000 | ORAL_TABLET | Freq: Four times a day (QID) | ORAL | Status: DC | PRN

## 2012-12-16 MED ORDER — ONDANSETRON HCL 4 MG PO TABS: 4.0000 mg | ORAL_TABLET | Freq: Three times a day (TID) | ORAL | Status: DC | PRN

## 2012-12-16 MED ORDER — ONDANSETRON HCL 4 MG/2ML IJ SOLN
4.0000 mg | Freq: Once | INTRAMUSCULAR | Status: AC
  Administered 2012-12-16: 4 mg via INTRAVENOUS
  Filled 2012-12-16: qty 2

## 2012-12-16 MED ORDER — HYDROMORPHONE HCL PF 1 MG/ML IJ SOLN
0.5000 mg | Freq: Once | INTRAMUSCULAR | Status: AC
  Administered 2012-12-16: 0.5 mg via INTRAVENOUS
  Filled 2012-12-16: qty 1

## 2012-12-16 NOTE — ED Provider Notes (Signed)
I saw and evaluated the patient, reviewed the resident's note and I agree with the findings and plan. I have reviewed EKG and agree with the resident interpretation.  you Patient with right upper quadrant pain concerning for cholelithiasis. Patient denies any fever but has had vomiting. Labs are within normal limits an ultrasound today shows cholelithiasis but no signs of cholecystitis. Patient's symptoms and pain were greatly improved after pain control. She was given followup to see surgery  Gwyneth Sprout, MD 12/16/12 2358

## 2012-12-16 NOTE — Telephone Encounter (Signed)
Patient says she has all of a sudden started having nausea, throwing up, fever and pain in her stomach.  What can she take.

## 2012-12-16 NOTE — ED Notes (Addendum)
Spoke with IV team regarding IV site. IV RN sts that as long as site flushes well without signs of swelling, redness or pain, then site is patent. Continue to monitor for any signs of complications.

## 2012-12-16 NOTE — Telephone Encounter (Signed)
Audrey Bison, MD 12/06/2012 10:39 AM Signed  This is a 67 year old Caucasian female who has a Parkinson's-like neurologic progressive illness and brought in her basal ganglia. She seems to be worsening with her neurological dysfunction, and is followed closely by neurology. I've been seen her about recurrent long-term nausea, and recent technetium gastric emptying scan showed 70% retention at 2 hours. She subsequently was placed on domperidone 10 mg 3 times a day before meals and at bedtime with good response. She also takes Nexium 40 mg a day for acid reflux. Additionally the patient has not been asymptomatic gallstones, but has not had previous episodes of biliary colic or other hepatobiliary symptoms. She denies any new neurological symptoms related to her domperidone. She does follow a modified gastroparesis diet. Her appetite is good her weight is stable, she's had no emesis, or lower bowel problems.  Pt reports she has been doing well and was OK this am until 11am when she started vomiting violently. She also c/o terrible stomach pain and a slight fever. She took Phenergan around 1130, with little relief. She is still has nausea and vomiting . She has known gallstones, but they have been asymptomatic. I asked if she could have been exposed to a bug or NORO virus and she stated no. She reports Dr Jarold Motto has instructed her in the past to go to the hospital. Please advise. Thanks.

## 2012-12-16 NOTE — ED Notes (Signed)
Pt c/o epigastric pain starting 11am today w/vomiting.  [id number] and feels it is a gallbladder attack.

## 2012-12-16 NOTE — ED Notes (Signed)
Pt denies tingling in hand currently. Denies pain at IV site or with infusion. IV infusing well, no redness, swelling noted.

## 2012-12-16 NOTE — ED Notes (Addendum)
Pt reports N/V/D that started this am. Pt family reports fever this am but does not have in triage after Ibuprofen. Pt abdominal pain reported at 5/10. Pt has dementia but able to answer questions. Pt denies chest pain or pressure.

## 2012-12-16 NOTE — Telephone Encounter (Signed)
Has gallstones.Audrey Brown  to ER...for evaluation

## 2012-12-16 NOTE — ED Notes (Addendum)
Pt c/o tingling in L hand after IV start. Tape loosened in case pressure of tape causing tingling. Pt reports tingling easing away after this. IV team paged to discuss. Will continue to monitor.

## 2012-12-16 NOTE — Telephone Encounter (Signed)
Spoke with Audrey Brown to inform her Dr Jarold Motto is concerned d/t her hx of gallstones and fever and he would like for her to go to the ER to be evaluated; Audrey Brown stated understanding.

## 2012-12-16 NOTE — ED Provider Notes (Signed)
History     CSN: [id number]  Arrival date & time 12/16/12  1518   First MD Initiated Contact with Patient 12/16/12 1545      Chief Complaint  Patient presents with  . Abdominal Pain  . Emesis    (Consider location/radiation/quality/duration/timing/severity/associated sxs/prior treatment) HPI Comments: 67 y.o PM[facility] PD with dementia, gastroparesis, 7 mm gallstones noted on [place] 07/2012 no thickening, no fluid.  She presents with epigastric pain since 11 am with vomiting x 3-4 x and dry heaving.  Pain in localized without radiation 5/10, dull, steady pain, T max 100 F.  She has felt cold and sweaty, decreased oral intake this am.    [facility]: tonsillectomy, C sec, mortons neuroma, C6 disc fusion, lump neck removed, ankle surgery, right wrist rod  PCP: Dr. Tressie Ellis.   Patient is a 67 y.o. female presenting with abdominal pain and vomiting.  Abdominal Pain The primary symptoms of the illness include abdominal pain, fever and vomiting. The primary symptoms of the illness do not include shortness of breath or dysuria.  Emesis  Associated symptoms include abdominal pain and a fever.    Past Medical History  Diagnosis Date  . Headache   . GERD (gastroesophageal reflux disease)   . corticobasilar degeneration     sees Dr. Zola Button at [facility]  . Osteoarthritis     has seen Dr Lestine Box  . Bipolar depression     had seen Dr. Tomasa Rand in past   . Dementia   . Neuromuscular disorder   . Gallstones   . Hiatal hernia 07/27/2012    EGD    Past Surgical History  Procedure Date  . Tonsillectomy   . Cesarean section   . Cervical disc surgery 1994    c-6   . Wrist surgery 06-24-2010    Dr Rayburn Ma right wrist  . Neck surgery     Lumpectomy  . Foot neuroma surgery     Family History  Problem Relation Age of Onset  . Emphysema Mother   . Heart attack Father   . Hypertension    . Lung cancer      History  Substance Use Topics  . Smoking status: Never Smoker   .  Smokeless tobacco: Never Used  . Alcohol Use: No    OB History    Grav Para Term Preterm Abortions TAB SAB Ect Mult Living                  Review of Systems  Constitutional: Positive for fever.  Respiratory: Negative for shortness of breath.   Cardiovascular: Negative for chest pain.  Gastrointestinal: Positive for vomiting, abdominal pain and abdominal distention.  Genitourinary: Negative for dysuria.  All other systems reviewed and are negative.    Allergies  Celebrex; Morphine and related; Pe-dexhlorphen-pyril-dm; Penicillins; and Statins  Home Medications   Current Outpatient Rx  Name  Route  Sig  Dispense  Refill  . AMBULATORY NON FORMULARY MEDICATION      Domperidone 10 mg  Please take one tablet by mouth five  times daily with meals and one tablet at bedtime         . BUPROPION HCL ER (XL) 300 MG PO TB24   Oral   Take 300 mg by mouth daily.           Marland Kitchen ESOMEPRAZOLE MAGNESIUM 40 MG PO CPDR   Oral   Take 40 mg by mouth 2 (two) times daily.         Marland Kitchen  ESZOPICLONE 2 MG PO TABS   Oral   Take 2 mg by mouth at bedtime. Take immediately before bedtime         . IBUPROFEN 800 MG PO TABS   Oral   Take 800 mg by mouth as needed. Pain         . LAMOTRIGINE 200 MG PO TABS   Oral   Take 200 mg by mouth daily.           Marland Kitchen ONE-DAILY MULTI VITAMINS PO TABS   Oral   Take 1 tablet by mouth daily.         Marland Kitchen PROMETHAZINE HCL 25 MG PO TABS   Oral   Take 25 mg by mouth every 6 (six) hours as needed. Nausea           BP 134/77  Pulse 80  Temp 98.4 F (36.9 C)  Resp 20  SpO2 99%  Physical Exam  Nursing note and vitals reviewed. Constitutional: She is oriented to person, place, and time. Vital signs are normal. She appears well-developed and well-nourished. She is cooperative. No distress.  HENT:  Head: Normocephalic and atraumatic.  Mouth/Throat: Oropharynx is clear and moist and mucous membranes are normal. No oropharyngeal exudate.  Eyes:  Conjunctivae normal are normal. Pupils are equal, round, and reactive to light. Right eye exhibits no discharge. Left eye exhibits no discharge. No scleral icterus.  Cardiovascular: Normal rate, regular rhythm, S1 normal, S2 normal and normal heart sounds.   No murmur heard. Pulmonary/Chest: Effort normal and breath sounds normal.  Abdominal: Soft. Bowel sounds are normal. There is tenderness. There is CVA tenderness.       Generalized ttp worst in RUQ, epigastric regions. +Right CVA ttp  Musculoskeletal: She exhibits no edema.  Neurological: She is alert and oriented to person, place, and time.  Skin: Skin is warm and dry. No rash noted. She is not diaphoretic.  Psychiatric: She has a normal mood and affect. Her behavior is normal. Judgment and thought content normal.    ED Course  Procedures (including critical care time)  Labs Reviewed  CBC WITH DIFFERENTIAL - Abnormal; Notable for the following:    Lymphs Abs 4.1 (*)     All other components within normal limits  COMPREHENSIVE METABOLIC PANEL  LIPASE, BLOOD  URINALYSIS, ROUTINE W REFLEX MICROSCOPIC   US Abdomen Complete  12/16/2012  *RADIOLOGY REPORT*  Clinical Data:  Abdominal pain, vomiting  COMPLETE ABDOMINAL ULTRASOUND  Comparison:  Ultrasound of the abdomen of 07/26/2012  Findings:  Gallbladder:  The gallbladder is visualized and there are gallstones present.  The gallbladder wall is not thickened and there is no pain over the gallbladder.  The patient however it is on pain medication currently.  Common bile duct:  The common bile duct is normal measuring 2.6 mm in diameter.  Liver:  The liver has a normal echogenic pattern.  No ductal dilatation is seen.  IVC:  Appears normal.  Pancreas:  The pancreas is largely obscured by bowel gas.  Spleen:  The spleen is normal measuring 4.4 cm sagittally.  Right Kidney:  No hydronephrosis is seen.  The right kidney measures 10.2 cm sagittally.  Left Kidney:  No hydronephrosis is noted.  The left  kidney measures 10.9 cm.  Abdominal aorta:  The abdominal aorta is normal in caliber.  IMPRESSION:  1.  Gallstones.  No present ultrasound evidence of acute cholecystitis is noted.  See above. 2.  The pancreas is obscured by bowel gas  and cannot be evaluated.   Original Report Authenticated By: Dwyane Dee, M.D.      1. Epigastric pain   2. Nausea & vomiting   3. Cholelithiasis      Date: 12/16/2012  Rate: 72  Rhythm: normal sinus rhythm  QRS Axis: normal  Intervals: normal  ST/T Wave abnormalities: normal  Conduction Disutrbances:none  Narrative Interpretation:   Old EKG Reviewed: unchanged    MDM  1. R/o acute cholecystitis, pancreatitis, CBD stone CMET, lipase, cbc Zofran, Dilaudid 0.5 x 1 US abdomen + cholelithiasis. No acute cholecystitis  D/c home with Zofran, Norco F/u with surgery outpatient   Shirlee Latch MD [phone]         Annett Gula, MD 12/16/12 [phone]

## 2012-12-22 ENCOUNTER — Ambulatory Visit (INDEPENDENT_AMBULATORY_CARE_PROVIDER_SITE_OTHER): Admitting: General Surgery

## 2012-12-23 ENCOUNTER — Encounter (INDEPENDENT_AMBULATORY_CARE_PROVIDER_SITE_OTHER): Admitting: General Surgery

## 2012-12-27 ENCOUNTER — Telehealth (INDEPENDENT_AMBULATORY_CARE_PROVIDER_SITE_OTHER): Payer: Self-pay

## 2012-12-27 NOTE — Telephone Encounter (Signed)
Message copied by Maryan Puls on Tue Dec 27, 2012  9:40 AM ------      Message from: Rise Paganini      Created: Mon Dec 26, 2012  4:03 PM      Regarding: Shelva Majestic: [phone]       Please call this patient about an appointment. She canceled her appointment on 01/11/13 and I attempted to reschedule and the next available morning is on 02/03/13. She doesn't want to wait that long. Thank you:) ------

## 2012-12-27 NOTE — Telephone Encounter (Signed)
Called and spoke to patient, follow up appointment scheduled for Friday 12/30/12 @ 10:30 am w/Dr. Biagio Quint.

## 2012-12-30 ENCOUNTER — Encounter (INDEPENDENT_AMBULATORY_CARE_PROVIDER_SITE_OTHER): Payer: Self-pay | Admitting: General Surgery

## 2012-12-30 ENCOUNTER — Ambulatory Visit (INDEPENDENT_AMBULATORY_CARE_PROVIDER_SITE_OTHER): Admitting: General Surgery

## 2012-12-30 VITALS — BP 124/70 | HR 78 | Temp 97.6°F | Resp 18 | Ht 62.0 in | Wt 160.0 lb

## 2012-12-30 DIAGNOSIS — K8020 Calculus of gallbladder without cholecystitis without obstruction: Secondary | ICD-10-CM

## 2012-12-30 NOTE — Progress Notes (Signed)
Patient ID: Audrey Brown, female   DOB: [birth year], 67 y.o.   MRN: [id number]  Chief Complaint  Patient presents with  . Follow-up    reck gallbladder    HPI Audrey Brown is a 67 y.o. female.  This patient is known to me for prior evaluation last fall of abdominal pain. She has neurologic issues as well as abdominal pain and nausea and vomiting. I set her up for a gastric imaging study last fall which was positive for delayed gastric emptying and she has been followed by Dr. Jarold Motto for this. He has been treating her for the gastroparesis and she says that she has improved and she does feel better but she continues to have symptoms of frequent nausea as well as almost daily right upper quadrant abdominal pain. This is noticeable pain but about 2 weeks ago she had a severe episode of abdominal pain and fevers and nausea and vomiting which took her to the emergency room. She had a repeat ultrasound which confirmed her known gallstones but no evidence of acute cholecystitis. HPI  Past Medical History  Diagnosis Date  . Headache   . GERD (gastroesophageal reflux disease)   . corticobasilar degeneration     sees Dr. Zola Button at [facility]  . Osteoarthritis     has seen Dr Lestine Box  . Bipolar depression     had seen Dr. Tomasa Rand in past   . Dementia   . Neuromuscular disorder   . Gallstones   . Hiatal hernia 07/27/2012    EGD    Past Surgical History  Procedure Laterality Date  . Tonsillectomy    . Cesarean section    . Cervical disc surgery  1994    c-6   . Wrist surgery  06-24-2010    Dr Rayburn Ma right wrist  . Neck surgery      Lumpectomy  . Foot neuroma surgery      Family History  Problem Relation Age of Onset  . Emphysema Mother   . Heart attack Father   . Hypertension    . Lung cancer      Social History History  Substance Use Topics  . Smoking status: Never Smoker   . Smokeless tobacco: Never Used  . Alcohol Use: No    Allergies  Allergen  Reactions  . Celebrex (Celecoxib)     REACTION: hives  . Morphine And Related Hives  . Pe-Dexhlorphen-Pyril-Dm Other (See Comments)    INCREASES TREMORS   . Penicillins   . Statins Other (See Comments)    Muscle aches     Current Outpatient Prescriptions  Medication Sig Dispense Refill  . AMBULATORY NON FORMULARY MEDICATION Domperidone 10 mg  Please take one tablet by mouth five  times daily with meals and one tablet at bedtime      . buPROPion (WELLBUTRIN XL) 300 MG 24 hr tablet Take 300 mg by mouth daily.        Marland Kitchen esomeprazole (NEXIUM) 40 MG capsule Take 40 mg by mouth 2 (two) times daily.      . eszopiclone (LUNESTA) 2 MG TABS Take 2 mg by mouth at bedtime. Take immediately before bedtime      . HYDROcodone-acetaminophen (NORCO) 5-325 MG per tablet Take 1 tablet by mouth every 6 (six) hours as needed for pain.  30 tablet  0  . ibuprofen (ADVIL,MOTRIN) 800 MG tablet Take 800 mg by mouth as needed. Pain      . lamoTRIgine (LAMICTAL) 200 MG tablet Take 200  mg by mouth daily.        . Multiple Vitamin (MULTIVITAMIN) tablet Take 1 tablet by mouth daily.      . ondansetron (ZOFRAN) 4 MG tablet Take 1 tablet (4 mg total) by mouth every 8 (eight) hours as needed for nausea.  20 tablet  0  . promethazine (PHENERGAN) 25 MG tablet Take 25 mg by mouth every 6 (six) hours as needed. Nausea       No current facility-administered medications for this visit.    Review of Systems Review of Systems All other review of systems negative or noncontributory except as stated in the HPI  Blood pressure 124/70, pulse 78, temperature 97.6 F (36.4 C), resp. rate 18, height 5\' 2"  (1.575 m), weight 160 lb (72.576 kg).  Physical Exam Physical Exam Physical Exam  Nursing note and vitals reviewed. Constitutional: She is oriented to person, place, and time. She appears well-developed and well-nourished. No distress.  HENT:  Head: Normocephalic and atraumatic.  Mouth/Throat: No oropharyngeal exudate.   Eyes: Conjunctivae and EOM are normal. Pupils are equal, round, and reactive to light. Right eye exhibits no discharge. Left eye exhibits no discharge. No scleral icterus.  Neck: Normal range of motion. Neck supple. No tracheal deviation present.  Cardiovascular: Normal rate, regular rhythm, normal heart sounds and intact distal pulses.   Pulmonary/Chest: Effort normal and breath sounds normal. No stridor. No respiratory distress. She has no wheezes.  Abdominal: Soft. Bowel sounds are normal. She exhibits no distension and no mass. There is mild RUQ tenderness. There is no rebound and no guarding.  Musculoskeletal: Normal range of motion. She exhibits no edema and no tenderness.  Neurological: She is alert and oriented to person, place, and time. She has tremor and is wheelchair bound. Skin: Skin is warm and dry. No rash noted. She is not diaphoretic. No erythema. No pallor.  Psychiatric: She has a normal mood and affect. Her behavior is normal. Judgment and thought content normal.   in andand Data Reviewed [place], GI records  Assessment    Symptomatic cholelithiasis We had a long discussion with the patient about her symptoms in the possibilities for surgery. Though her symptoms are classic and she has another confounding issues with her neurologic disease as well as her gastroparesis, she continues to have nausea and daily pain despite treatment. She does have cholelithiasis on ultrasound and so we discussed the possibility of cholecystectomy for possible relief or improvement of her symptoms. She would like to proceed with cholecystectomy. I think that it would be helpful even if it does not improve her symptoms to a limited this as a potential source and to allow for further workup and treatment. We did discuss the possibility of of persistent symptoms and no improvement in her symptoms and she expressed understanding of this. The risks of infection, bleeding, pain, persistent symptoms, scarring,  injury to bowel or bile ducts, retained stone, diarrhea, need for additional procedures, and need for open surgery discussed with the patient.      Plan    We will go ahead and plan for cholecystectomy        Carlis Blanchard DAVID 12/30/2012, 11:06 AM

## 2013-01-11 ENCOUNTER — Encounter (INDEPENDENT_AMBULATORY_CARE_PROVIDER_SITE_OTHER): Admitting: General Surgery

## 2013-03-10 ENCOUNTER — Telehealth: Payer: Self-pay | Admitting: Family Medicine

## 2013-03-10 NOTE — Telephone Encounter (Signed)
Refill request for Lunesta 2 mg and Ibuprofen 800 mg, send to [facility].

## 2013-03-13 MED ORDER — ESZOPICLONE 2 MG PO TABS: 2.0000 mg | ORAL_TABLET | Freq: Every day | ORAL | Status: DC

## 2013-03-13 MED ORDER — IBUPROFEN 800 MG PO TABS: 800.0000 mg | ORAL_TABLET | ORAL | Status: DC | PRN

## 2013-03-13 NOTE — Telephone Encounter (Signed)
Call in Motrin #[phone] with 3 rf, also Lunesta #90 with one rf

## 2013-03-13 NOTE — Telephone Encounter (Signed)
Rx sent to pharmacy   

## 2013-03-28 ENCOUNTER — Encounter: Admitting: Family Medicine

## 2013-04-10 ENCOUNTER — Emergency Department (HOSPITAL_COMMUNITY)

## 2013-04-10 ENCOUNTER — Encounter (HOSPITAL_COMMUNITY): Payer: Self-pay | Admitting: Emergency Medicine

## 2013-04-10 ENCOUNTER — Emergency Department (HOSPITAL_COMMUNITY)
Admission: EM | Admit: 2013-04-10 | Discharge: 2013-04-10 | Disposition: A | Discharge status: Home / Self Care | Attending: Emergency Medicine | Admitting: Emergency Medicine

## 2013-04-10 DIAGNOSIS — F319 Bipolar disorder, unspecified: Secondary | ICD-10-CM | POA: Insufficient documentation

## 2013-04-10 DIAGNOSIS — K219 Gastro-esophageal reflux disease without esophagitis: Secondary | ICD-10-CM | POA: Insufficient documentation

## 2013-04-10 DIAGNOSIS — F0280 Dementia in other diseases classified elsewhere without behavioral disturbance: Secondary | ICD-10-CM | POA: Insufficient documentation

## 2013-04-10 DIAGNOSIS — R1011 Right upper quadrant pain: Secondary | ICD-10-CM | POA: Insufficient documentation

## 2013-04-10 DIAGNOSIS — Z79899 Other long term (current) drug therapy: Secondary | ICD-10-CM | POA: Insufficient documentation

## 2013-04-10 DIAGNOSIS — Z8739 Personal history of other diseases of the musculoskeletal system and connective tissue: Secondary | ICD-10-CM | POA: Insufficient documentation

## 2013-04-10 DIAGNOSIS — Z8669 Personal history of other diseases of the nervous system and sense organs: Secondary | ICD-10-CM | POA: Insufficient documentation

## 2013-04-10 DIAGNOSIS — G20 Parkinson's disease: Secondary | ICD-10-CM | POA: Insufficient documentation

## 2013-04-10 DIAGNOSIS — Z8719 Personal history of other diseases of the digestive system: Secondary | ICD-10-CM | POA: Insufficient documentation

## 2013-04-10 DIAGNOSIS — G20A1 Parkinson's disease without dyskinesia, without mention of fluctuations: Secondary | ICD-10-CM | POA: Insufficient documentation

## 2013-04-10 DIAGNOSIS — R112 Nausea with vomiting, unspecified: Secondary | ICD-10-CM | POA: Insufficient documentation

## 2013-04-10 DIAGNOSIS — R259 Unspecified abnormal involuntary movements: Secondary | ICD-10-CM | POA: Insufficient documentation

## 2013-04-10 DIAGNOSIS — M1990 Unspecified osteoarthritis, unspecified site: Secondary | ICD-10-CM | POA: Insufficient documentation

## 2013-04-10 LAB — CBC WITH DIFFERENTIAL/PLATELET
Basophils Relative: 0 % (ref 0–1)
Eosinophils Absolute: 0.2 10*3/uL (ref 0.0–0.7)
Eosinophils Relative: 2 % (ref 0–5)
HCT: 40.5 % (ref 36.0–46.0)
Hemoglobin: 13.7 g/dL (ref 12.0–15.0)
MCH: 30 pg (ref 26.0–34.0)
MCHC: 33.8 g/dL (ref 30.0–36.0)
MCV: 88.8 fL (ref 78.0–100.0)
Monocytes Absolute: 0.6 10*3/uL (ref 0.1–1.0)
Monocytes Relative: 7 % (ref 3–12)
Neutrophils Relative %: 57 % (ref 43–77)

## 2013-04-10 LAB — COMPREHENSIVE METABOLIC PANEL
Albumin: 3.7 g/dL (ref 3.5–5.2)
BUN: 8 mg/dL (ref 6–23)
Calcium: 9.4 mg/dL (ref 8.4–10.5)
Creatinine, Ser: 0.63 mg/dL (ref 0.50–1.10)
GFR calc Af Amer: >90 mL/min (ref >90)
Total Protein: 7.1 g/dL (ref 6.0–8.3)

## 2013-04-10 LAB — POCT I-STAT TROPONIN I: Troponin i, poc: 0 ng/mL (ref 0.00–0.08)

## 2013-04-10 LAB — LIPASE, BLOOD: Lipase: 33 U/L (ref 11–59)

## 2013-04-10 MED ORDER — IOHEXOL 300 MG/ML  SOLN
100.0000 mL | Freq: Once | INTRAMUSCULAR | Status: AC | PRN
  Administered 2013-04-10: 100 mL via INTRAVENOUS

## 2013-04-10 MED ORDER — IOHEXOL 300 MG/ML  SOLN
50.0000 mL | Freq: Once | INTRAMUSCULAR | Status: AC | PRN
  Administered 2013-04-10: 50 mL via ORAL

## 2013-04-10 MED ORDER — METOCLOPRAMIDE HCL 5 MG/ML IJ SOLN
10.0000 mg | Freq: Once | INTRAMUSCULAR | Status: AC
  Administered 2013-04-10: 10 mg via INTRAVENOUS
  Filled 2013-04-10: qty 2

## 2013-04-10 MED ORDER — ONDANSETRON 4 MG PO TBDP: 4.0000 mg | ORAL_TABLET | Freq: Three times a day (TID) | ORAL | Status: DC | PRN

## 2013-04-10 MED ORDER — SODIUM CHLORIDE 0.9 % IV BOLUS (SEPSIS)
1000.0000 mL | Freq: Once | INTRAVENOUS | Status: AC
  Administered 2013-04-10: 1000 mL via INTRAVENOUS

## 2013-04-10 MED ORDER — FENTANYL CITRATE 0.05 MG/ML IJ SOLN
50.0000 ug | Freq: Once | INTRAMUSCULAR | Status: AC
  Administered 2013-04-10: 50 ug via INTRAVENOUS
  Filled 2013-04-10: qty 2

## 2013-04-10 MED ORDER — HYDROMORPHONE HCL PF 1 MG/ML IJ SOLN
1.0000 mg | Freq: Once | INTRAMUSCULAR | Status: AC
  Administered 2013-04-10: 1 mg via INTRAVENOUS
  Filled 2013-04-10: qty 1

## 2013-04-10 MED ORDER — ONDANSETRON HCL 4 MG/2ML IJ SOLN
4.0000 mg | Freq: Once | INTRAMUSCULAR | Status: AC
  Administered 2013-04-10: 4 mg via INTRAVENOUS
  Filled 2013-04-10: qty 2

## 2013-04-10 MED ORDER — ONDANSETRON HCL 4 MG/2ML IJ SOLN
INTRAMUSCULAR | Status: AC
  Administered 2013-04-10: 4 mg via INTRAVENOUS
  Filled 2013-04-10: qty 2

## 2013-04-10 MED ORDER — ONDANSETRON HCL 4 MG/2ML IJ SOLN: 4.0000 mg | Freq: Once | INTRAMUSCULAR | Status: AC

## 2013-04-10 MED ORDER — OXYCODONE-ACETAMINOPHEN 5-325 MG PO TABS: 1.0000 | ORAL_TABLET | ORAL | Status: DC | PRN

## 2013-04-10 NOTE — ED Notes (Signed)
rx x 2 given for percocet and zofran- family at bedside to drive

## 2013-04-10 NOTE — ED Notes (Signed)
Pt c/o of right upper quadrant abd pain, nausea, vomiting. Gallbladder removed on 5/21. Pain 10/10. Phenergan 25mg  no relief.

## 2013-04-10 NOTE — ED Notes (Signed)
Patient transported to CT 

## 2013-04-10 NOTE — ED Provider Notes (Signed)
History     CSN: [id number]  Arrival date & time 04/10/13  1236   First MD Initiated Contact with Patient 04/10/13 1322      Chief Complaint  Patient presents with  . Abdominal Pain    (Consider location/radiation/quality/duration/timing/severity/associated sxs/prior treatment) HPI Comments: Patient with PMH significant for GERD, bipolar, OA, dementia, corticobasilar degeneration, is to the ED for abdominal pain. Pain described as sharp, non-radiating and localized to RUQ associated with nausea and vomiting.  Denies any hematemesis. Laparascopic cholecystectomy at [facility] hospital on 5/21.  States there were no complications and she stayed in the hospital overnight for observation.  The first few days she felt fine but thinks she "overdid it" on Saturday.  Has been resting the past few days but pain has not improved.  Has had decreased PO intake since pain, nausea, and vomiting started.  BM normal and non-bloody.  Denies any chest pain, SOB, palpitations, dizziness, or weakness.  No fevers, sweats, or chills. Has taken PO phenergan without relief.  The history is provided by the patient.    Past Medical History  Diagnosis Date  . Headache(784.0)   . GERD (gastroesophageal reflux disease)   . corticobasilar degeneration     sees Dr. Zola Button at [facility]  . Osteoarthritis     has seen Dr Lestine Box  . Bipolar depression     had seen Dr. Tomasa Rand in past   . Dementia   . Neuromuscular disorder   . Gallstones   . Hiatal hernia 07/27/2012    EGD    Past Surgical History  Procedure Laterality Date  . Tonsillectomy    . Cesarean section    . Cervical disc surgery  1994    c-6   . Wrist surgery  06-24-2010    Dr Rayburn Ma right wrist  . Neck surgery      Lumpectomy  . Foot neuroma surgery      Family History  Problem Relation Age of Onset  . Emphysema Mother   . Heart attack Father   . Hypertension    . Lung cancer      History  Substance Use Topics  . Smoking  status: Never Smoker   . Smokeless tobacco: Never Used  . Alcohol Use: No    OB History   Grav Para Term Preterm Abortions TAB SAB Ect Mult Living                  Review of Systems  Gastrointestinal: Positive for nausea, vomiting and abdominal pain.  All other systems reviewed and are negative.    Allergies  Celebrex; Morphine and related; Penicillins; Risperdal; and Statins  Home Medications   Current Outpatient Rx  Name  Route  Sig  Dispense  Refill  . buPROPion (WELLBUTRIN XL) 300 MG 24 hr tablet   Oral   Take 300 mg by mouth at bedtime.          Marland Kitchen esomeprazole (NEXIUM) 40 MG capsule   Oral   Take 40 mg by mouth 2 (two) times daily.         . eszopiclone (LUNESTA) 2 MG TABS   Oral   Take 2 mg by mouth at bedtime as needed (for sleep). Take immediately before bedtime         . ibuprofen (ADVIL,MOTRIN) 200 MG tablet   Oral   Take 600 mg by mouth every 8 (eight) hours as needed for pain.         Marland Kitchen lamoTRIgine (  LAMICTAL) 200 MG tablet   Oral   Take 200 mg by mouth at bedtime.          . Multiple Vitamin (MULTIVITAMIN WITH MINERALS) TABS   Oral   Take 1 tablet by mouth daily.         Marland Kitchen PRESCRIPTION MEDICATION   Oral   Take 1 tablet by mouth 6 (six) times daily. Domperidone 10mg          . promethazine (PHENERGAN) 25 MG tablet   Oral   Take 25 mg by mouth every 6 (six) hours as needed for nausea.            BP 138/57  Pulse 87  Temp(Src) 98.3 F (36.8 C) (Oral)  Resp 16  SpO2 97%  Physical Exam  Nursing note and vitals reviewed. Constitutional: She is oriented to person, place, and time. She appears well-developed and well-nourished.  HENT:  Head: Normocephalic and atraumatic.  Mouth/Throat: Oropharynx is clear and moist.  Eyes: Conjunctivae and EOM are normal. Pupils are equal, round, and reactive to light.  Neck: Normal range of motion. Neck supple.  Cardiovascular: Normal rate, regular rhythm and normal heart sounds.    Pulmonary/Chest: Effort normal and breath sounds normal.  Abdominal: Soft. Bowel sounds are normal. There is tenderness in the right upper quadrant. There is no CVA tenderness and no tenderness at McBurney's point.  Lap chole incision sites healing well with surgical staples in place, no surrounding erythema or signs of infection  Musculoskeletal: Normal range of motion.  Neurological: She is alert and oriented to person, place, and time. She has normal strength. She displays tremor. No cranial nerve deficit or sensory deficit.  Parkinsonian tremor due to corticobasilar degeneration; CN grossly intact, moves extremities appropriately, no facial droop appreciated  Skin: Skin is warm and dry.  Psychiatric: She has a normal mood and affect.    ED Course  Procedures (including critical care time)  Labs Reviewed  CBC WITH DIFFERENTIAL  COMPREHENSIVE METABOLIC PANEL  LIPASE, BLOOD  POCT I-STAT TROPONIN I   Ct Abdomen Pelvis W Contrast  04/10/2013   *RADIOLOGY REPORT*  Clinical Data: Right upper quadrant pain.  Abdominal pain.  Nausea and vomiting.  Cholecystectomy on 05/21.  CT ABDOMEN AND PELVIS WITH CONTRAST  Technique:  Multidetector CT imaging of the abdomen and pelvis was performed following the standard protocol during bolus administration of intravenous contrast.  Contrast: 50mL OMNIPAQUE IOHEXOL 300 MG/ML  SOLN, OMNIPAQUE IOHEXOL 300 MG/ML  SOLN  Comparison: Ultrasound 12/16/2012.  Findings:  Lung Bases: 2 mm pulmonary nodule is present in the lingula adjacent to the major fissure.  Dependent atelectasis.  4 mm pulmonary nodule in the left lower lobe (image #9 series 6).  Liver:  Normal.  No intrahepatic biliary ductal dilation.  Spleen:  Normal.  Gallbladder:  Surgically absent.  Clips in the fossa.  Minuscule amount of low attenuation along the gallbladder fossa is probably postoperative.  No discrete fluid collection is identified.  The  Common bile duct:  Normal appearance for CT.   Pancreas:  Fatty atrophy of the head and uncinate process.  Body and tail appear normal.  Adrenal glands:  Normal.  Kidneys:  Normal. Normal enhancement.  Both ureters appear normal. No calculi.  Stomach:  Distended with oral contrast.  Small bowel:  No small bowel inflammatory changes.  No free air. No mesenteric adenopathy. Small peri ampullary duodenal diverticulum incidentally noted on coronal imaging.  Colon:   Normal appendix.  Ascending,  transverse and descending colon appear normal.  Scattered areas of sigmoid diverticulosis without diverticulitis.  Pelvic Genitourinary:  The urinary bladder is decompressed.  There is no free fluid.  Physiologic appearance of the uterus and adnexa. No adenopathy.  Bones:  Chronic L1 superior endplate compression fracture.  Grade 1 retrolisthesis of L1 on L2.  No aggressive osseous lesions or new compression fractures identified.  Vasculature: Within normal limits.  Body Wall: Staples are present to the right of midline compatible with laparoscopic port for recent cholecystectomy.  Staples are also present in the periumbilical region.  Fat containing periumbilical hernia.  No fluid collections in the body wall. Midline scarring in the anterior abdominal wall. Calcified granuloma is present in the left gluteal region.  IMPRESSION:  1.  Expected postoperative changes following recent cholecystectomy.  No complicating features.  No abscess or evidence of biloma. 2.  4 mm left lower lobe subpleural pulmonary nodule. If the patient is at high risk for bronchogenic carcinoma, follow-up chest CT at 1 year is recommended.  If the patient is at low risk, no follow-up is needed.  This recommendation follows the consensus statement: Guidelines for Management of Small Pulmonary Nodules Detected on CT Scans:  A Statement from the Fleischner Society as published in Radiology 2005; 237:395-400.   Original Report Authenticated By: Andreas Newport, M.D.     1. Abdominal pain, RUQ   2.  Nausea & vomiting       MDM   67 y.o. F presenting to the ED for RUQ abdominal pain following lap chole on 5/21.  Thinks she "overdid it" a few days ago but pain has continued despite resting.  N/V present.  No fevers, sweats, or chills. VS stable.  Trop negative.  Labs unremarkable.  CT abd pelvis with findings consistent of post- cholecystectomy.  Pain and nausea well controlled with IV dilaudid, reglan, and zofran.  Pt in afebrile, non-toxic appearing, NAD, VS remained stable.  Repeat abdominal exam improved, pain rated 1/10 at time of d/c.  Pt will FU with her surgeon asap- copies of labs given for his review.  Rx percocet and zofran.  Discussed plan with pt and family, they agreed.  Return precautions advised.     Garlon Hatchet, PA-C 04/10/13 1942

## 2013-04-11 ENCOUNTER — Telehealth: Payer: Self-pay | Admitting: Gastroenterology

## 2013-04-11 NOTE — ED Provider Notes (Signed)
Medical screening examination/treatment/procedure(s) were conducted as a shared visit with non-physician practitioner(s) and myself.  I personally evaluated the patient during the encounter.  Patient presents with right upper abdominal pain. Patient underwent recent cholecystectomy. She reports that she had been healing well afterwards, and was very active over the weekend and now having increased pain. Exam reveals tenderness without peritonitis. Workup was unremarkable. Patient provided analgesia, will followup with General surgery in the office, return if symptoms worsen.  Gilda Crease, MD 04/11/13 [phone]

## 2013-04-12 MED ORDER — AMBULATORY NON FORMULARY MEDICATION: Status: DC

## 2013-04-12 NOTE — Telephone Encounter (Signed)
Pt called with problems with the Domperidone script. Informed her we now order from [facility]; reordered. She asked we use [facility] except for acute problems and informed her I cannot delete [facility], but make sure she tells the person ordering her meds when she calls. Dr Jarold Motto, pt wants you to know she had a cholecystectomy at the [facility] in [place]; impressed with the service.

## 2013-05-23 ENCOUNTER — Telehealth: Payer: Self-pay | Admitting: Gastroenterology

## 2013-05-23 MED ORDER — ESOMEPRAZOLE MAGNESIUM 40 MG PO CPDR: 40.0000 mg | DELAYED_RELEASE_CAPSULE | Freq: Two times a day (BID) | ORAL | Status: DC

## 2013-05-23 NOTE — Telephone Encounter (Signed)
RX sent

## 2013-06-05 ENCOUNTER — Other Ambulatory Visit: Payer: Self-pay | Admitting: Family

## 2013-06-09 ENCOUNTER — Telehealth: Payer: Self-pay | Admitting: Gastroenterology

## 2013-06-09 MED ORDER — AMBULATORY NON FORMULARY MEDICATION: Status: DC

## 2013-06-09 NOTE — Telephone Encounter (Signed)
Had to leave patient a message  Domperidone is sent to a pharmacy in [place]  Rx faxed

## 2013-06-29 ENCOUNTER — Other Ambulatory Visit: Payer: Self-pay | Admitting: Gastroenterology

## 2013-06-29 NOTE — Telephone Encounter (Signed)
Patient need refill on Zofran Is this ok to send?

## 2013-06-30 MED ORDER — ONDANSETRON 4 MG PO TBDP: 4.0000 mg | ORAL_TABLET | Freq: Three times a day (TID) | ORAL | Status: DC | PRN

## 2013-06-30 NOTE — Telephone Encounter (Signed)
Per Dr. Jarold Motto ok to send Zofran Patient wants RX sent to [facility] RX sent

## 2013-06-30 NOTE — Addendum Note (Signed)
Addended by: Ok Anis A on: 06/30/2013 08:45 AM   Modules accepted: Orders

## 2013-08-15 ENCOUNTER — Other Ambulatory Visit: Payer: Self-pay | Admitting: Family Medicine

## 2013-08-15 NOTE — Telephone Encounter (Signed)
Per Dr. Clent Ridges okay to fill script for Advil, which I did send e[facility]scribe.

## 2013-08-24 ENCOUNTER — Other Ambulatory Visit: Payer: Self-pay

## 2013-08-24 DIAGNOSIS — Z124 Encounter for screening for malignant neoplasm of cervix: Secondary | ICD-10-CM | POA: Diagnosis not present

## 2013-08-24 DIAGNOSIS — Z1231 Encounter for screening mammogram for malignant neoplasm of breast: Secondary | ICD-10-CM | POA: Diagnosis not present

## 2013-08-25 ENCOUNTER — Other Ambulatory Visit: Payer: Self-pay | Admitting: Obstetrics and Gynecology

## 2013-08-25 DIAGNOSIS — T148XXA Other injury of unspecified body region, initial encounter: Secondary | ICD-10-CM

## 2013-08-29 ENCOUNTER — Ambulatory Visit
Admission: RE | Admit: 2013-08-29 | Discharge: 2013-08-29 | Disposition: A | Discharge status: Home / Self Care | Payer: Medicare Other | Source: Ambulatory Visit | Attending: Obstetrics and Gynecology | Admitting: Obstetrics and Gynecology

## 2013-08-29 DIAGNOSIS — T148XXA Other injury of unspecified body region, initial encounter: Secondary | ICD-10-CM

## 2013-08-29 DIAGNOSIS — M899 Disorder of bone, unspecified: Secondary | ICD-10-CM | POA: Diagnosis not present

## 2013-09-20 DIAGNOSIS — M899 Disorder of bone, unspecified: Secondary | ICD-10-CM | POA: Diagnosis not present

## 2013-09-28 ENCOUNTER — Encounter: Payer: Self-pay | Admitting: Gastroenterology

## 2013-09-28 DIAGNOSIS — M810 Age-related osteoporosis without current pathological fracture: Secondary | ICD-10-CM | POA: Diagnosis not present

## 2013-10-18 ENCOUNTER — Other Ambulatory Visit (HOSPITAL_COMMUNITY): Payer: Self-pay | Admitting: *Deleted

## 2013-10-19 ENCOUNTER — Encounter (HOSPITAL_COMMUNITY)
Admission: RE | Admit: 2013-10-19 | Discharge: 2013-10-19 | Disposition: A | Discharge status: Home / Self Care | Payer: Medicare Other | Source: Ambulatory Visit | Attending: Obstetrics and Gynecology | Admitting: Obstetrics and Gynecology

## 2013-10-19 DIAGNOSIS — M810 Age-related osteoporosis without current pathological fracture: Secondary | ICD-10-CM | POA: Insufficient documentation

## 2013-10-19 MED ORDER — ZOLEDRONIC ACID 5 MG/100ML IV SOLN
INTRAVENOUS | Status: AC
  Administered 2013-10-19: 5 mg via INTRAVENOUS
  Filled 2013-10-19: qty 100

## 2013-10-19 MED ORDER — ZOLEDRONIC ACID 5 MG/100ML IV SOLN: 5.0000 mg | Freq: Once | INTRAVENOUS | Status: AC

## 2013-10-29 ENCOUNTER — Other Ambulatory Visit: Payer: Self-pay | Admitting: Family Medicine

## 2013-11-14 ENCOUNTER — Telehealth: Payer: Self-pay | Admitting: Gastroenterology

## 2013-11-14 ENCOUNTER — Ambulatory Visit: Payer: Medicare Other | Admitting: Gastroenterology

## 2013-11-14 NOTE — Telephone Encounter (Signed)
no

## 2013-11-23 ENCOUNTER — Encounter: Payer: Self-pay | Admitting: Family Medicine

## 2013-11-23 ENCOUNTER — Ambulatory Visit (INDEPENDENT_AMBULATORY_CARE_PROVIDER_SITE_OTHER): Payer: Medicare Other | Admitting: Family Medicine

## 2013-11-23 VITALS — BP 122/74 | Temp 97.8°F | Wt 167.0 lb

## 2013-11-23 DIAGNOSIS — R5381 Other malaise: Secondary | ICD-10-CM

## 2013-11-23 DIAGNOSIS — J209 Acute bronchitis, unspecified: Secondary | ICD-10-CM

## 2013-11-23 DIAGNOSIS — R5383 Other fatigue: Secondary | ICD-10-CM | POA: Diagnosis not present

## 2013-11-23 DIAGNOSIS — R531 Weakness: Secondary | ICD-10-CM

## 2013-11-23 LAB — CBC WITH DIFFERENTIAL/PLATELET
Basophils Absolute: 0 10*3/uL (ref 0.0–0.1)
Basophils Relative: 0.5 % (ref 0.0–3.0)
Eosinophils Absolute: 0.2 10*3/uL (ref 0.0–0.7)
Eosinophils Relative: 2.3 % (ref 0.0–5.0)
HCT: 41.5 % (ref 36.0–46.0)
Hemoglobin: 14 g/dL (ref 12.0–15.0)
LYMPHS ABS: 2.9 10*3/uL (ref 0.7–4.0)
LYMPHS PCT: 36 % (ref 12.0–46.0)
MCHC: 33.7 g/dL (ref 30.0–36.0)
MCV: 88.1 fl (ref 78.0–100.0)
MONOS PCT: 6.2 % (ref 3.0–12.0)
Monocytes Absolute: 0.5 10*3/uL (ref 0.1–1.0)
NEUTROS PCT: 55 % (ref 43.0–77.0)
Neutro Abs: 4.4 10*3/uL (ref 1.4–7.7)
Platelets: 304 10*3/uL (ref 150.0–400.0)
RBC: 4.71 Mil/uL (ref 3.87–5.11)
RDW: 13.7 % (ref 11.5–14.6)
WBC: 7.9 10*3/uL (ref 4.5–10.5)

## 2013-11-23 LAB — BASIC METABOLIC PANEL
BUN: 11 mg/dL (ref 6–23)
CHLORIDE: 109 meq/L (ref 96–112)
CO2: 28 meq/L (ref 19–32)
CREATININE: 0.8 mg/dL (ref 0.4–1.2)
Calcium: 9.3 mg/dL (ref 8.4–10.5)
GFR: 81.1 mL/min (ref >60.00)
GLUCOSE: 114 mg/dL — AB (ref 70–99)
Potassium: 5.1 mEq/L (ref 3.5–5.1)
Sodium: 144 mEq/L (ref 135–145)

## 2013-11-23 LAB — HEPATIC FUNCTION PANEL
ALBUMIN: 4 g/dL (ref 3.5–5.2)
ALT: 19 U/L (ref 0–35)
AST: 16 U/L (ref 0–37)
Alkaline Phosphatase: 61 U/L (ref 39–117)
BILIRUBIN DIRECT: 0.1 mg/dL (ref 0.0–0.3)
TOTAL PROTEIN: 6.7 g/dL (ref 6.0–8.3)
Total Bilirubin: 0.7 mg/dL (ref 0.3–1.2)

## 2013-11-23 LAB — TSH: TSH: 1.35 u[IU]/mL (ref 0.35–5.50)

## 2013-11-23 MED ORDER — AZITHROMYCIN 250 MG PO TABS: ORAL_TABLET | ORAL | Status: DC

## 2013-11-23 MED ORDER — HYDROCODONE-HOMATROPINE 5-1.5 MG/5ML PO SYRP: 5.0000 mL | ORAL_SOLUTION | ORAL | Status: DC | PRN

## 2013-11-23 MED ORDER — OXYCODONE-ACETAMINOPHEN 5-325 MG PO TABS: 1.0000 | ORAL_TABLET | Freq: Four times a day (QID) | ORAL | Status: DC | PRN

## 2013-11-23 NOTE — Progress Notes (Signed)
   Subjective:    Patient ID: Audrey Brown, female    DOB: [birth year], 68 y.o.   MRN: [id number]  HPI Here with her daughter for 2 weeks of fatigue, feeling hot or cold without a documented fever, and a dry cough. Drinking fluids   Review of Systems  Constitutional: Positive for chills and fatigue. Negative for fever and diaphoresis.  HENT: Positive for congestion. Negative for sinus pressure.   Eyes: Negative.   Respiratory: Positive for cough and chest tightness.        Objective:   Physical Exam  Constitutional: She appears well-developed and well-nourished. No distress.  HENT:  Right Ear: External ear normal.  Left Ear: External ear normal.  Nose: Nose normal.  Mouth/Throat: Oropharynx is clear and moist.  Eyes: Conjunctivae are normal.  Pulmonary/Chest: Effort normal. No respiratory distress. She has no wheezes. She has no rales.  Scattered rhonchi   Lymphadenopathy:    She has no cervical adenopathy.          Assessment & Plan:  Recheck prn

## 2013-11-23 NOTE — Progress Notes (Signed)
Pre visit review using our clinic review tool, if applicable. No additional management support is needed unless otherwise documented below in the visit note. 

## 2013-12-05 ENCOUNTER — Ambulatory Visit (INDEPENDENT_AMBULATORY_CARE_PROVIDER_SITE_OTHER): Payer: Medicare Other | Admitting: Gastroenterology

## 2013-12-05 ENCOUNTER — Encounter: Payer: Self-pay | Admitting: Gastroenterology

## 2013-12-05 VITALS — BP 120/78 | HR 80 | Ht 62.0 in | Wt 174.0 lb

## 2013-12-05 DIAGNOSIS — K219 Gastro-esophageal reflux disease without esophagitis: Secondary | ICD-10-CM | POA: Diagnosis not present

## 2013-12-05 DIAGNOSIS — K3184 Gastroparesis: Secondary | ICD-10-CM | POA: Diagnosis not present

## 2013-12-05 DIAGNOSIS — G20 Parkinson's disease: Secondary | ICD-10-CM | POA: Diagnosis not present

## 2013-12-05 MED ORDER — AMBULATORY NON FORMULARY MEDICATION: Status: DC

## 2013-12-05 MED ORDER — ESOMEPRAZOLE MAGNESIUM 40 MG PO CPDR: 40.0000 mg | DELAYED_RELEASE_CAPSULE | Freq: Two times a day (BID) | ORAL | Status: DC

## 2013-12-05 NOTE — Patient Instructions (Signed)
Prescription for Domperidone was faxed to [place]

## 2013-12-05 NOTE — Progress Notes (Signed)
This is a 68 year old Caucasian female with probable Parkinson's disease managed by neurology at [facility].  She currently is on Lamictal 200 mg at bedtime as her only therapy for motor neuron disease.  She is mostly spasticity in her legs and is nonambulatory.  Her previous workup because of severe acid reflux and epigastric abdominal pain was consistent with GERD and also she had a markedly abnormal gastric emptying scan.  She's been on domperidone 10 mg 2-4 times a day along with a gastroparesis diet and has done extremely well over the last year maintained a normal weight.  She occasionally have some spasmodic-type esophageal pain with hot or cold liquids.  Otherwise she is eating well and has no real complaints.  She is accompanied today by her caregiver who manages her medications and lives with the patient.  Current Medications, Allergies, Past Medical History, Past Surgical History, Family History and Social History were reviewed in [employer] record.  ROS: All systems were reviewed and are negative unless otherwise stated in the HPI.          Physical Exam: Blood pressure 120/78, pulse 80 and regular weight 174 the BMI of 31.82.  Abdominal exam is unremarkable without masses or tenderness.  Mental status is normal.  She has rather marked resting tremor is in a wheelchair.  I did not make her get out of the wheelchair for examination.  She otherwise appears healthy and has a normal mental status.  There no stigmata of chronic liver disease.    Assessment and Plan: Idiopathic gastroparesis with secondary GERD doing well on PPI therapy and prokinetic therapy.  I have renewed her Nexium 40 mg a day and also her domperidone 10 mg to 4 times a day depending on her clinical status.  She also is to continue to follow a gastroparesis diet as best as possible.  I've urged to continue all other medications as listed and reviewed with regular neurology  followup.

## 2013-12-05 NOTE — Addendum Note (Signed)
Addended by: Hope Pigeon A on: 12/05/2013 11:14 AM   Modules accepted: Orders

## 2013-12-14 ENCOUNTER — Telehealth: Payer: Self-pay | Admitting: Family Medicine

## 2013-12-14 NOTE — Telephone Encounter (Signed)
Caller: Audrey Brown/Patient; Phone: [phone]; Reason for Call: Patient states she goes to [facility] for her Bipolar issues.  She is currently on Lamictal XR 200mg  and take 1 tablet daily and Wellbutrin SR 200mg  take daily.  [facility] is no longer there.  She cannot see the new physician until May 2015.  She only has a 10 day supply of the medication.  Can Dr.  Sarajane Jews Rx these medicaitons for her until May 2015 ?  Reviewed Medications with patient . I had her read medication from her current bottles. MAR was not correct regarding the Wellbutrin.  Patient would [facility]. Please contact patient and update.

## 2013-12-15 MED ORDER — LAMOTRIGINE ER 200 MG PO TB24: 1.0000 | ORAL_TABLET | Freq: Every day | ORAL | Status: AC

## 2013-12-15 MED ORDER — BUPROPION HCL ER (SR) 200 MG PO TB12: 200.0000 mg | ORAL_TABLET | Freq: Every day | ORAL | Status: DC

## 2013-12-15 NOTE — Telephone Encounter (Signed)
I sent in both for 90 days and one refill

## 2014-01-11 ENCOUNTER — Other Ambulatory Visit: Payer: Self-pay | Admitting: Family Medicine

## 2014-01-12 NOTE — Telephone Encounter (Signed)
Per Dr. Fry, okay to refill. 

## 2014-01-29 ENCOUNTER — Encounter (HOSPITAL_COMMUNITY): Payer: Self-pay | Admitting: Emergency Medicine

## 2014-01-29 ENCOUNTER — Emergency Department (HOSPITAL_COMMUNITY): Payer: Medicare Other

## 2014-01-29 ENCOUNTER — Emergency Department (HOSPITAL_COMMUNITY)
Admission: EM | Admit: 2014-01-29 | Discharge: 2014-01-29 | Disposition: A | Discharge status: Home / Self Care | Payer: Medicare Other | Attending: Emergency Medicine | Admitting: Emergency Medicine

## 2014-01-29 DIAGNOSIS — M549 Dorsalgia, unspecified: Secondary | ICD-10-CM

## 2014-01-29 DIAGNOSIS — W1809XA Striking against other object with subsequent fall, initial encounter: Secondary | ICD-10-CM | POA: Insufficient documentation

## 2014-01-29 DIAGNOSIS — IMO0002 Reserved for concepts with insufficient information to code with codable children: Secondary | ICD-10-CM | POA: Diagnosis not present

## 2014-01-29 DIAGNOSIS — F3130 Bipolar disorder, current episode depressed, mild or moderate severity, unspecified: Secondary | ICD-10-CM | POA: Insufficient documentation

## 2014-01-29 DIAGNOSIS — S6990XA Unspecified injury of unspecified wrist, hand and finger(s), initial encounter: Secondary | ICD-10-CM | POA: Diagnosis not present

## 2014-01-29 DIAGNOSIS — F0390 Unspecified dementia without behavioral disturbance: Secondary | ICD-10-CM | POA: Diagnosis not present

## 2014-01-29 DIAGNOSIS — M545 Low back pain, unspecified: Secondary | ICD-10-CM | POA: Diagnosis not present

## 2014-01-29 DIAGNOSIS — S5010XA Contusion of unspecified forearm, initial encounter: Secondary | ICD-10-CM | POA: Diagnosis not present

## 2014-01-29 DIAGNOSIS — Y9301 Activity, walking, marching and hiking: Secondary | ICD-10-CM | POA: Insufficient documentation

## 2014-01-29 DIAGNOSIS — Z880 Allergy status to penicillin: Secondary | ICD-10-CM | POA: Diagnosis not present

## 2014-01-29 DIAGNOSIS — Z79899 Other long term (current) drug therapy: Secondary | ICD-10-CM | POA: Diagnosis not present

## 2014-01-29 DIAGNOSIS — S5012XA Contusion of left forearm, initial encounter: Secondary | ICD-10-CM

## 2014-01-29 DIAGNOSIS — Z8669 Personal history of other diseases of the nervous system and sense organs: Secondary | ICD-10-CM | POA: Insufficient documentation

## 2014-01-29 DIAGNOSIS — S59909A Unspecified injury of unspecified elbow, initial encounter: Secondary | ICD-10-CM | POA: Diagnosis not present

## 2014-01-29 DIAGNOSIS — Y929 Unspecified place or not applicable: Secondary | ICD-10-CM | POA: Insufficient documentation

## 2014-01-29 DIAGNOSIS — K219 Gastro-esophageal reflux disease without esophagitis: Secondary | ICD-10-CM | POA: Insufficient documentation

## 2014-01-29 DIAGNOSIS — M79609 Pain in unspecified limb: Secondary | ICD-10-CM | POA: Diagnosis not present

## 2014-01-29 DIAGNOSIS — M1990 Unspecified osteoarthritis, unspecified site: Secondary | ICD-10-CM | POA: Insufficient documentation

## 2014-01-29 DIAGNOSIS — W19XXXA Unspecified fall, initial encounter: Secondary | ICD-10-CM

## 2014-01-29 MED ORDER — OXYCODONE-ACETAMINOPHEN 5-325 MG PO TABS
2.0000 | ORAL_TABLET | Freq: Once | ORAL | Status: AC
  Administered 2014-01-29: 2 via ORAL
  Filled 2014-01-29: qty 2

## 2014-01-29 MED ORDER — IBUPROFEN 800 MG PO TABS
800.0000 mg | ORAL_TABLET | Freq: Once | ORAL | Status: AC
  Administered 2014-01-29: 800 mg via ORAL
  Filled 2014-01-29: qty 1

## 2014-01-29 NOTE — ED Notes (Signed)
Pt fell down about 6-8 stairs around 4 pm today. Pt c/o back pain and left hand and arm pain. Pt states that when she has fallen in past she had compression fracture.

## 2014-01-29 NOTE — ED Provider Notes (Signed)
CSN: [id number]     Arrival date & time 01/29/14  1657 History   First MD Initiated Contact with Patient 01/29/14 1907     Chief Complaint  Patient presents with  . Back Pain  . arm pain   . Hand Pain     (Consider location/radiation/quality/duration/timing/severity/associated sxs/prior Treatment) Patient is a 68 y.o. female presenting with back pain and hand pain.  Back Pain Hand Pain   Pt with history of tremor and occasional falls reports her legs gave out while walking down stairs this afternoon. She fell on her buttocks hitting her back and her L forearm. Denies head injury or LOC.   Past Medical History  Diagnosis Date  . Headache(784.0)   . GERD (gastroesophageal reflux disease)   . corticobasilar degeneration     sees Dr. Tonye Royalty at [facility]  . Osteoarthritis     has seen Dr Beola Cord  . Bipolar depression     had seen Dr. Candis Schatz in past   . Dementia   . Neuromuscular disorder   . Gallstones   . Hiatal hernia 07/27/2012    EGD   Past Surgical History  Procedure Laterality Date  . Tonsillectomy    . Cesarean section    . Cervical disc surgery  1994    c-6   . Wrist surgery  06-24-2010    Dr Rush Farmer right wrist  . Neck surgery      Lumpectomy  . Foot neuroma surgery     Family History  Problem Relation Age of Onset  . Emphysema Mother   . Heart attack Father   . Hypertension    . Lung cancer     History  Substance Use Topics  . Smoking status: Never Smoker   . Smokeless tobacco: Never Used  . Alcohol Use: No   OB History   Grav Para Term Preterm Abortions TAB SAB Ect Mult Living                 Review of Systems  Musculoskeletal: Positive for back pain.    All other systems reviewed and are negative except as noted in HPI.    Allergies  Celebrex; Morphine and related; Penicillins; Risperdal; and Statins  Home Medications   Current Outpatient Rx  Name  Route  Sig  Dispense  Refill  . AMBULATORY NON FORMULARY MEDICATION       Domperidone 10 mg Please take one tablet by mouth four times daily before meals and at bedtime   120 tablet   2   . buPROPion (WELLBUTRIN SR) 200 MG 12 hr tablet   Oral   Take 1 tablet (200 mg total) by mouth daily.   90 tablet   1   . Calcium 500 MG CHEW   Oral   Chew 1 tablet by mouth 2 (two) times daily.         Marland Kitchen esomeprazole (NEXIUM) 40 MG capsule   Oral   Take 1 capsule (40 mg total) by mouth 2 (two) times daily.   180 capsule   6   . eszopiclone (LUNESTA) 2 MG TABS   Oral   Take 2 mg by mouth at bedtime as needed (for sleep). Take immediately before bedtime         . ibuprofen (ADVIL,MOTRIN) 800 MG tablet      TAKE 1 TABLET EVERY 8 HOURS AS NEEDED FOR PAIN   270 tablet   0   . LamoTRIgine XR (LAMICTAL XR) 200 MG TB24  Oral   Take 1 tablet (200 mg total) by mouth daily.   90 tablet   1   . Magnesium 500 MG CAPS   Oral   Take 1 capsule by mouth daily.         . promethazine (PHENERGAN) 25 MG tablet   Oral   Take 25 mg by mouth every 6 (six) hours as needed for nausea.           BP 165/62  Pulse 91  Temp(Src) 97.9 F (36.6 C) (Oral)  Resp 18  SpO2 99% Physical Exam  Nursing note and vitals reviewed. Constitutional: She is oriented to person, place, and time. She appears well-developed and well-nourished.  HENT:  Head: Normocephalic and atraumatic.  Eyes: EOM are normal. Pupils are equal, round, and reactive to light.  Neck: Normal range of motion. Neck supple.  Cardiovascular: Normal rate, normal heart sounds and intact distal pulses.   Pulmonary/Chest: Effort normal and breath sounds normal.  Abdominal: Bowel sounds are normal. She exhibits no distension. There is no tenderness.  Musculoskeletal: Normal range of motion. She exhibits tenderness (L forearm, diffuse back, no step offs or deformities). She exhibits no edema.  Neurological: She is alert and oriented to person, place, and time. She has normal strength. No cranial nerve  deficit or sensory deficit.  Skin: Skin is warm and dry. No rash noted.  Psychiatric: She has a normal mood and affect.    ED Course  Procedures (including critical care time) Labs Review Labs Reviewed - No data to display Imaging Review Dg Thoracic Spine 2 View  01/29/2014   CLINICAL DATA:  Fall, back pain  EXAM: THORACIC SPINE - 2 VIEW  COMPARISON:  01/03/2011  FINDINGS: Three views of thoracic spine submitted. Mild mid thoracic dextroscoliosis again noted. No acute infiltrate or pulmonary edema. Stable mild degenerative changes with mild anterior spurring. Stable mild compression fracture of L1 vertebral body.  IMPRESSION: Negative.   Electronically Signed   By: Lahoma Crocker M.D.   On: 01/29/2014 19:10   Dg Lumbar Spine Complete  01/29/2014   CLINICAL DATA:  Fall, back pain  EXAM: LUMBAR SPINE - COMPLETE 4+ VIEW  COMPARISON:  04/10/2013  FINDINGS: Five views of lumbar spine submitted. No acute fracture or subluxation. Stable compression fracture superior endplate of L1 vertebral body.  IMPRESSION: No acute fracture or subluxation. Stable compression fracture upper endplate of L1.   Electronically Signed   By: Lahoma Crocker M.D.   On: 01/29/2014 19:11   Dg Forearm Left  01/29/2014   CLINICAL DATA:  Fall, pain  EXAM: LEFT FOREARM - 2 VIEW  COMPARISON:  None.  FINDINGS: Two views of left forearm submitted. No acute fracture or subluxation. No radiopaque foreign body.  IMPRESSION: Negative.   Electronically Signed   By: Lahoma Crocker M.D.   On: 01/29/2014 18:57   Dg Wrist Complete Left  01/29/2014   CLINICAL DATA:  Fall, elbow pain  EXAM: LEFT WRIST - COMPLETE 3+ VIEW  COMPARISON:  None.  FINDINGS: Four views of left wrist submitted. No acute fracture or subluxation. No radiopaque foreign body.  IMPRESSION: Negative.   Electronically Signed   By: Lahoma Crocker M.D.   On: 01/29/2014 18:57   Dg Hand Complete Left  01/29/2014   CLINICAL DATA:  Fall, finger pain, elbow pain  EXAM: LEFT HAND - COMPLETE 3+ VIEW   COMPARISON:  None.  FINDINGS: Three views of left hand submitted. No acute fracture or subluxation. There is  diffuse osteopenia. Narrowing of radiocarpal joint space. Mild degenerative changes first carpometacarpal joint. Degenerative changes distal interphalangeal joints.  IMPRESSION: No acute fracture or subluxation. Diffuse osteopenia. Degenerative changes as described above.   Electronically Signed   By: Lahoma Crocker M.D.   On: 01/29/2014 18:58     EKG Interpretation None      MDM   Final diagnoses:  Fall  Contusion of left forearm  Back pain    Pt with fall, no acute fracture on xrays. Pt has pain meds at home. PCP followup.     Charles B. Karle Starch, MD 01/29/14 1950

## 2014-01-29 NOTE — Discharge Instructions (Signed)
Back Injury Prevention °Back injuries can be extremely painful and difficult to heal. After having one back injury, you are much more likely to experience another later on. It is important to learn how to avoid injuring or re-injuring your back. The following tips can help you to prevent a back injury. °PHYSICAL FITNESS °· Exercise regularly and try to develop good tone in your abdominal muscles. Your abdominal muscles provide a lot of the support needed by your back. °· Do aerobic exercises (walking, jogging, biking, swimming) regularly. °· Do exercises that increase balance and strength (tai chi, yoga) regularly. This can decrease your risk of falling and injuring your back. °· Stretch before and after exercising. °· Maintain a healthy weight. The more you weigh, the more stress is placed on your back. For every pound of weight, 10 times that amount of pressure is placed on the back. °DIET °· Talk to your caregiver about how much calcium and vitamin D you need per day. These nutrients help to prevent weakening of the bones (osteoporosis). Osteoporosis can cause broken (fractured) bones that lead to back pain. °· Include good sources of calcium in your diet, such as dairy products, green, leafy vegetables, and products with calcium added (fortified). °· Include good sources of vitamin D in your diet, such as milk and foods that are fortified with vitamin D. °· Consider taking a nutritional supplement or a multivitamin if needed. °· Stop smoking if you smoke. °POSTURE °· Sit and stand up straight. Avoid leaning forward when you sit or hunching over when you stand. °· Choose chairs with good low back (lumbar) support. °· If you work at a desk, sit close to your work so you do not need to lean over. Keep your chin tucked in. Keep your neck drawn back and elbows bent at a right angle. Your arms should look like the letter "L." °· Sit high and close to the steering wheel when you drive. Add a lumbar support to your car  seat if needed. °· Avoid sitting or standing in one position for too long. Take breaks to get up, stretch, and walk around at least once every hour. Take breaks if you are driving for long periods of time. °· Sleep on your side with your knees slightly bent, or sleep on your back with a pillow under your knees. Do not sleep on your stomach. °LIFTING, TWISTING, AND REACHING °· Avoid heavy lifting, especially repetitive lifting. If you must do heavy lifting: °· Stretch before lifting. °· Work slowly. °· Rest between lifts. °· Use carts and dollies to move objects when possible. °· Make several small trips instead of carrying 1 heavy load. °· Ask for help when you need it. °· Ask for help when moving big, awkward objects. °· Follow these steps when lifting: °· Stand with your feet shoulder-width apart. °· Get as close to the object as you can. Do not try to pick up heavy objects that are far from your body. °· Use handles or lifting straps if they are available. °· Bend at your knees. Squat down, but keep your heels off the floor. °· Keep your shoulders pulled back, your chin tucked in, and your back straight. °· Lift the object slowly, tightening the muscles in your legs, abdomen, and buttocks. Keep the object as close to the center of your body as possible. °· When you put a load down, use these same guidelines in reverse. °· Do not: °· Lift the object above your waist. °·   Twist at the waist while lifting or carrying a load. Move your feet if you need to turn, not your waist. °· Bend over without bending at your knees. °· Avoid reaching over your head, across a table, or for an object on a high surface. °OTHER TIPS °· Avoid wet floors and keep sidewalks clear of ice to prevent falls. °· Do not sleep on a mattress that is too soft or too hard. °· Keep items that are used frequently within easy reach. °· Put heavier objects on shelves at waist level and lighter objects on lower or higher shelves. °· Find ways to  decrease your stress, such as exercise, massage, or relaxation techniques. Stress can build up in your muscles. Tense muscles are more vulnerable to injury. °· Seek treatment for depression or anxiety if needed. These conditions can increase your risk of developing back pain. °SEEK MEDICAL CARE IF: °· You injure your back. °· You have questions about diet, exercise, or other ways to prevent back injuries. °MAKE SURE YOU: °· Understand these instructions. °· Will watch your condition. °· Will get help right away if you are not doing well or get worse. °Document Released: 12/03/2004 Document Revised: 01/18/2012 Document Reviewed: 12/07/2011 °ExitCare® Patient Information ©2014 [employer]. ° °Contusion °A contusion is a deep bruise. Contusions are the result of an injury that caused bleeding under the skin. The contusion may turn blue, purple, or yellow. Minor injuries will give you a painless contusion, but more severe contusions may stay painful and swollen for a few weeks.  °CAUSES  °A contusion is usually caused by a blow, trauma, or direct force to an area of the body. °SYMPTOMS  °· Swelling and redness of the injured area. °· Bruising of the injured area. °· Tenderness and soreness of the injured area. °· Pain. °DIAGNOSIS  °The diagnosis can be made by taking a history and physical exam. An X-ray, CT scan, or MRI may be needed to determine if there were any associated injuries, such as fractures. °TREATMENT  °Specific treatment will depend on what area of the body was injured. In general, the best treatment for a contusion is resting, icing, elevating, and applying cold compresses to the injured area. Over-the-counter medicines may also be recommended for pain control. Ask your caregiver what the best treatment is for your contusion. °HOME CARE INSTRUCTIONS  °· Put ice on the injured area. °· Put ice in a plastic bag. °· Place a towel between your skin and the bag. °· Leave the ice on for 15-20 minutes, 03-04  times a day. °· Only take over-the-counter or prescription medicines for pain, discomfort, or fever as directed by your caregiver. Your caregiver may recommend avoiding anti-inflammatory medicines (aspirin, ibuprofen, and naproxen) for 48 hours because these medicines may increase bruising. °· Rest the injured area. °· If possible, elevate the injured area to reduce swelling. °SEEK IMMEDIATE MEDICAL CARE IF:  °· You have increased bruising or swelling. °· You have pain that is getting worse. °· Your swelling or pain is not relieved with medicines. °MAKE SURE YOU:  °· Understand these instructions. °· Will watch your condition. °· Will get help right away if you are not doing well or get worse. °Document Released: 08/05/2005 Document Revised: 01/18/2012 Document Reviewed: 08/31/2011 °ExitCare® Patient Information ©2014 [employer]. ° °

## 2014-02-06 ENCOUNTER — Ambulatory Visit (INDEPENDENT_AMBULATORY_CARE_PROVIDER_SITE_OTHER): Payer: Medicare Other | Admitting: Family Medicine

## 2014-02-06 ENCOUNTER — Encounter: Payer: Self-pay | Admitting: Family Medicine

## 2014-02-06 VITALS — BP 138/86 | HR 77 | Temp 98.9°F

## 2014-02-06 DIAGNOSIS — G20 Parkinson's disease: Secondary | ICD-10-CM | POA: Diagnosis not present

## 2014-02-06 DIAGNOSIS — S32009A Unspecified fracture of unspecified lumbar vertebra, initial encounter for closed fracture: Secondary | ICD-10-CM

## 2014-02-06 DIAGNOSIS — R5383 Other fatigue: Secondary | ICD-10-CM

## 2014-02-06 DIAGNOSIS — R5381 Other malaise: Secondary | ICD-10-CM | POA: Diagnosis not present

## 2014-02-06 MED ORDER — ESZOPICLONE 3 MG PO TABS: 3.0000 mg | ORAL_TABLET | Freq: Every day | ORAL | Status: DC

## 2014-02-06 NOTE — Progress Notes (Signed)
   Subjective:    Patient ID: Audrey Brown, female    DOB: 02/19/1946, 68 y.o.   MRN: [id number]  HPI Here to follow up after a fall down some steps in her home on 01-29-14. She feels like her legs got weak and gave out, causing her to fall. She had multiple mild contusions but no significant injuries. She has been walking short distances at home using her walker but she feels weak and unsteady.    Review of Systems  Respiratory: Negative.   Cardiovascular: Negative.   Neurological: Positive for tremors, speech difficulty and weakness. Negative for dizziness, seizures, syncope, light-headedness and numbness.       Objective:   Physical Exam  Constitutional: She is oriented to person, place, and time. She appears well-developed and well-nourished.  Cardiovascular: Normal rate, regular rhythm, normal heart sounds and intact distal pulses.   Pulmonary/Chest: Effort normal and breath sounds normal.  Neurological: She is alert and oriented to person, place, and time.  Psychiatric: She has a normal mood and affect. Her behavior is normal. Thought content normal.          Assessment & Plan:  We will refer to Physical Med Rehab.

## 2014-02-06 NOTE — Progress Notes (Signed)
Pre visit review using our clinic review tool, if applicable. No additional management support is needed unless otherwise documented below in the visit note. 

## 2014-02-28 ENCOUNTER — Encounter: Payer: Self-pay | Admitting: Physical Medicine & Rehabilitation

## 2014-03-25 ENCOUNTER — Other Ambulatory Visit: Payer: Self-pay | Admitting: Family Medicine

## 2014-04-10 ENCOUNTER — Ambulatory Visit (HOSPITAL_BASED_OUTPATIENT_CLINIC_OR_DEPARTMENT_OTHER): Payer: Medicare Other | Admitting: Physical Medicine & Rehabilitation

## 2014-04-10 ENCOUNTER — Encounter: Payer: Self-pay | Admitting: Physical Medicine & Rehabilitation

## 2014-04-10 ENCOUNTER — Encounter: Payer: Medicare Other | Attending: Physical Medicine & Rehabilitation

## 2014-04-10 VITALS — BP 139/42 | HR 81 | Resp 14 | Ht 62.0 in | Wt 160.0 lb

## 2014-04-10 DIAGNOSIS — G3185 Corticobasal degeneration: Secondary | ICD-10-CM | POA: Insufficient documentation

## 2014-04-10 DIAGNOSIS — G20A1 Parkinson's disease without dyskinesia, without mention of fluctuations: Secondary | ICD-10-CM | POA: Insufficient documentation

## 2014-04-10 DIAGNOSIS — M47817 Spondylosis without myelopathy or radiculopathy, lumbosacral region: Secondary | ICD-10-CM

## 2014-04-10 DIAGNOSIS — G20 Parkinson's disease: Secondary | ICD-10-CM

## 2014-04-10 DIAGNOSIS — F319 Bipolar disorder, unspecified: Secondary | ICD-10-CM | POA: Insufficient documentation

## 2014-04-10 DIAGNOSIS — R269 Unspecified abnormalities of gait and mobility: Secondary | ICD-10-CM | POA: Insufficient documentation

## 2014-04-10 DIAGNOSIS — G709 Myoneural disorder, unspecified: Secondary | ICD-10-CM | POA: Diagnosis not present

## 2014-04-10 DIAGNOSIS — F0280 Dementia in other diseases classified elsewhere without behavioral disturbance: Secondary | ICD-10-CM | POA: Insufficient documentation

## 2014-04-10 DIAGNOSIS — K219 Gastro-esophageal reflux disease without esophagitis: Secondary | ICD-10-CM | POA: Diagnosis not present

## 2014-04-10 NOTE — Patient Instructions (Signed)
The plan is to repeat radio frequency ablation in the lumbar area L2 L3-L4 medial branch is and L5 dorsal ramus first on the right side and then one month later on the left side  We will follow this with outpatient physical therapy at the neuro rehabilitation clinic, [place]

## 2014-04-10 NOTE — Progress Notes (Signed)
Subjective:    Patient ID: Audrey Brown, female    DOB: 1946/04/06, 68 y.o.   MRN: [id number]  HPI  Reason for referral: Weakness and falls  History: 67-year-o history of movement disorder. It has been classified as Parkinson's disease as well as cortical basilar degeneration and sees a neurologist at [place] department of neurolog[facility] Patient had a fall in March of 2015 which did not result in injury however was the 19th fall in the last 2 years. Has had left wrist fracture, rib fractures  L1 compression fracture January 2013  Past medical history significant for bipolar disorder treated at the [facility] in [place]  Has tried home health physical therapy but progress was limited by back pain as well as decreased memory.   Reviewed MRI lumbar spine 2012 which showed an acute compression fracture L1 but otherwise no evidence of disc disease no evidence of lumbar spinal stenosis. There was evidence of lumbar spondylosis affecting primarily L3-4 and L4-5 levels.   Patient's daughter recalls a procedure performed at [facility] for low back pain which was very helpful  Patient ambulates with a walker at home and also furniture walks  Uses a fall mat next to the bed, Walker's next to the bed. Ambulates independently to the bathroom at night approximately one time per night   New chair lift Pain Inventory Average Pain 7 Pain Right Now 6 My pain is dull  In the last 24 hours, has pain interfered with the following? General activity 6 Relation with others 6 Enjoyment of life 6 What TIME of day is your pain at its worst? varies Sleep (in general) Poor  Pain is worse with: standing Pain improves with: rest Relief from Meds: 8  Mobility walk with assistance use a walker ability to climb steps?  yes do you drive?  no use a wheelchair needs help with transfers  Function not employed: date last employed na I need assistance with the following:  dressing, bathing, meal  prep, household duties and shopping  Neuro/Psych trouble walking depression  Prior Studies Any changes since last visit?  yes bone scan x-rays CT/MRI  Physicians involved in your care Any changes since last visit?  yes Primary care Dr. Alysia Penna SERVICE DATE: 07/23/2011  PREPROCEDURE DIAGNOSIS: Lumbar spondylosis.  POSTPROCEDURE DIAGNOSIS: Lumbar spondylosis.  PROCEDURES: 1. Left L2, L3, L4, and L5 medial branch/dorsal ramus radiofrequency denervation. 2. Fluoroscopic guidance.  HISTORY: Audrey Brown is status post left L2-L5 medial branch/dorsal ramus diagnostic block and had significant pain relief.  DESCRIPTION OF PROCEDURE: After signing an informed written consent, Audrey Brown was placed in the prone position. Her left lumbar and sacral region was prepped and draped in a sterile fashion. The groove of the left sacral ala was identified. Lidocaine 1% was used for local anesthesia. With the guidance of fluoroscopy, a 22-gauge 3.5-inch RF cannula was advanced into the groove. Aspiration for blood was negative. The sensory and motor nerve testings were carried out and deemed to be satisfactory. Lidocaine 1% 0.5 mL was injected. Subsequently, the same procedure was performed at the junction of left transverse process and superior facet process at L5, L4, and L3 vertebral levels. Celestone 3 mg was then injected per site. She tolerated the entire procedure well.  Dictated by: Shara Blazing, MD Attending Physician Rehabilitation Med    Family History  Problem Relation Age of Onset  . Emphysema Mother   . Heart attack Father   . Hypertension    . Lung  cancer     History   Social History  . Marital Status: Widowed    Spouse Name: N/A    Number of Children: 4  . Years of Education: N/A   Occupational History  . Retired    Social History Main Topics  . Smoking status: Never Smoker   . Smokeless tobacco: Never Used  . Alcohol Use: No  . Drug Use: No  .  Sexual Activity: None   Other Topics Concern  . None   Social History Narrative   Daughter is her POA and must come to all visits in GI and [place]    Past Surgical History  Procedure Laterality Date  . Tonsillectomy    . Cesarean section    . Cervical disc surgery  1994    c-6   . Wrist surgery  06-24-2010    Dr Rush Farmer right wrist  . Neck surgery      Lumpectomy  . Foot neuroma surgery     Past Medical History  Diagnosis Date  . Headache(784.0)   . GERD (gastroesophageal reflux disease)   . corticobasilar degeneration     sees Dr. Tonye Royalty at [facility]  . Osteoarthritis     has seen Dr Beola Cord  . Bipolar depression     had seen Dr. Candis Schatz in past   . Dementia   . Gallstones   . Neuromuscular disorder   . Hiatal hernia 07/27/2012    EGD   BP 139/42  Pulse 81  Resp 14  Ht 5\' 2"  (1.575 m)  Wt 160 lb (72.576 kg)  BMI 29.26 kg/m2  SpO2 96%  Opioid Risk Score:   Fall Risk Score: High Fall Risk (>13 points) (pt educated on fall risk, brochure given to pt)    Review of Systems  Cardiovascular: Positive for leg swelling.  Gastrointestinal: Positive for nausea and vomiting.  Musculoskeletal: Positive for back pain and gait problem.  Psychiatric/Behavioral:       Depression  All other systems reviewed and are negative.      Objective:   Physical Exam  Nursing note and vitals reviewed. Constitutional: She is oriented to person, place, and time. She appears well-developed and well-nourished.  HENT:  Head: Normocephalic and atraumatic.  Eyes: Conjunctivae and EOM are normal. Pupils are equal, round, and reactive to light.  Neck: Normal range of motion. Neck supple.  Neurological: She is alert and oriented to person, place, and time. Seizure activity: intention. Coordination abnormal.  Reflex Scores:      Tricep reflexes are 3+ on the right side and 3+ on the left side.      Bicep reflexes are 3+ on the right side and 3+ on the left side.       Brachioradialis reflexes are 3+ on the right side and 3+ on the left side.      Patellar reflexes are 3+ on the right side and 3+ on the left side.      Achilles reflexes are 2+ on the right side and 2+ on the left side. Oriented to person [place], MD office, not address  Iintention tremor bilateral upper extremities  No evidence of cog wheel  rigidity   Psychiatric: She has a normal mood and affect.       Assess#1. Cortical basilar degeneration with movement disorder poor balance   2. Lumbar spondylosis which has respond 2 Mobilityment & Plan:  The patient would benefit from outpatient neuro rehabilitation however her low back pain is limiting her  therapy participation. Given her cognitive deficits related to above as well as her fall risk, would minimize the use of narcotic medications as well as other medications that may or increased all risks and affect cognition such as muscle relaxer's  2. Lumbar spondylosis which has responded very well in the past to radiofrequency procedure. Previous procedure was L2 L3-L4 medial branch and L5 dorsal ramus. This was performed approximately 3 years ago. It has worn off and may be repeated We'll schedule L2 L3-L4 medial branch and L5 dorsal ramus radiofrequency ablation right side 1 month later we will perform the left side After that we will refer to physical therapy outpatient neuro rehabilitation

## 2014-05-15 ENCOUNTER — Other Ambulatory Visit: Payer: Self-pay | Admitting: Gastroenterology

## 2014-05-29 ENCOUNTER — Encounter: Payer: Medicare Other | Attending: Physical Medicine & Rehabilitation

## 2014-05-29 ENCOUNTER — Ambulatory Visit (HOSPITAL_BASED_OUTPATIENT_CLINIC_OR_DEPARTMENT_OTHER): Payer: Medicare Other | Admitting: Physical Medicine & Rehabilitation

## 2014-05-29 ENCOUNTER — Encounter: Payer: Self-pay | Admitting: Physical Medicine & Rehabilitation

## 2014-05-29 VITALS — BP 132/73 | HR 66 | Resp 14 | Wt 174.8 lb

## 2014-05-29 DIAGNOSIS — F0280 Dementia in other diseases classified elsewhere without behavioral disturbance: Secondary | ICD-10-CM | POA: Insufficient documentation

## 2014-05-29 DIAGNOSIS — F319 Bipolar disorder, unspecified: Secondary | ICD-10-CM | POA: Insufficient documentation

## 2014-05-29 DIAGNOSIS — R269 Unspecified abnormalities of gait and mobility: Secondary | ICD-10-CM | POA: Diagnosis not present

## 2014-05-29 DIAGNOSIS — G20 Parkinson's disease: Secondary | ICD-10-CM | POA: Diagnosis not present

## 2014-05-29 DIAGNOSIS — G20A1 Parkinson's disease without dyskinesia, without mention of fluctuations: Secondary | ICD-10-CM | POA: Insufficient documentation

## 2014-05-29 DIAGNOSIS — G709 Myoneural disorder, unspecified: Secondary | ICD-10-CM | POA: Diagnosis not present

## 2014-05-29 DIAGNOSIS — M47817 Spondylosis without myelopathy or radiculopathy, lumbosacral region: Secondary | ICD-10-CM | POA: Insufficient documentation

## 2014-05-29 DIAGNOSIS — K219 Gastro-esophageal reflux disease without esophagitis: Secondary | ICD-10-CM | POA: Insufficient documentation

## 2014-05-29 DIAGNOSIS — G3185 Corticobasal degeneration: Secondary | ICD-10-CM | POA: Diagnosis not present

## 2014-05-29 MED ORDER — GABAPENTIN 600 MG PO TABS: 600.0000 mg | ORAL_TABLET | Freq: Three times a day (TID) | ORAL | Status: DC

## 2014-05-29 NOTE — Progress Notes (Signed)
  [facility]   Name: Audrey Brown DOB:01/18/1946 MRN: [id number]  Date:05/29/2014  Physician: Alysia Penna, MD    Nurse/CMA: Shumaker RN  Allergies:  Allergies  Allergen Reactions  . Celebrex [Celecoxib]     REACTION: hives  . Morphine And Related Hives  . Penicillins   . Risperdal [Risperidone]     Severe tremors  . Statins Other (See Comments)    Muscle aches     Consent Signed: Yes.    Is patient diabetic? No.  CBG today?  Pregnant: No. LMP: No LMP recorded. Patient is postmenopausal. (age 19-55)  Anticoagulants: no Anti-inflammatory: no Antibiotics: no  Procedure: Right Lumbar radiofrequency neurotomy L2-3-4-5 Position: Prone Start Time:1:51  End Time: 2:17 Fluoro Time:57seconds  RN/CMA Biomedical engineer    Time 1:28 2:25    BP 132/73 146/81    Pulse 66 57    Respirations 14 14    O2 Sat 99 97    S/S 6 6    Pain Level 8/10 0/10     D/C home with family, patient A & O X 3, D/C instructions reviewed, and sits independently.

## 2014-05-29 NOTE — Patient Instructions (Signed)

## 2014-05-29 NOTE — Progress Notes (Signed)
RightL5 dorsal ramus., Right L4 and Right L3,L2 medial branch radio frequency neuropathy under fluoroscopic guidance   Indication: Low back pain due to lumbar spondylosis which has been relieved on 2 occasions by greater than 50% by lumbar medial branch blocks at corresponding levels.  Informed consent was obtained after describing risks and benefits of the procedure with the patient, this includes bleeding, bruising, infection, paralysis and medication side effects. The patient wishes to proceed and has given written consent. The patient was placed in a prone position. The lumbar and sacral area was marked and prepped with Betadine. A 25-gauge 1-1/2 inch needle was inserted into the skin and subcutaneous tissue at 3 sites in one ML of 1% lidocaine was injected into each site. Then a 20-gauge 10cm cm radio frequency needle with a 1 cm curved active tip was inserted targeting the Right S1 SAP/sacral ala junction. Bone contact was made and confirmed with lateral imaging. Sensory stimulation at 50 Hz followed by motor stimulation at 2 Hz confirm proper needle location followed by injection of one ML of the solution containing one ML of 4 mg per mL dexamethasone and 3 mL of 1% MPF lidocaine. Then the Right L5 SAP/transverse process junction was targeted. Bone contact was made and confirmed with lateral imaging. Sensory stimulation at 50 Hz followed by motor stimulation at 2 Hz confirm proper needle location followed by injection of one ML of the solution containing one ML of 4 mg per mL dexamethasone and 3 mL of 1% MPF lidocaine. Then the Right L4 SAP/transverse process junction was targeted. Bone contact was made and confirmed with lateral imaging. Sensory stimulation at 50 Hz followed by motor stimulation at 2 Hz confirm proper needle location followed by injection of one ML of the solution containing one ML of 4 mg per mL dexamethasone and 3 mL of 1% MPF lidocaine. Radio frequency lesion being at [facility] for 90  seconds was performed. Needles were removed. Post procedure instructions and vital signs were performed. Patient tolerated procedure well. Followup appointment was given.

## 2014-06-06 ENCOUNTER — Other Ambulatory Visit: Payer: Self-pay | Admitting: Family Medicine

## 2014-07-03 ENCOUNTER — Telehealth: Payer: Self-pay | Admitting: Family Medicine

## 2014-07-03 ENCOUNTER — Telehealth: Payer: Self-pay | Admitting: Gastroenterology

## 2014-07-03 NOTE — Telephone Encounter (Signed)
Dr. Hilarie Fredrickson,  Patient called and wanted refill on Domperidone. I advised patient that we can no longer receive Domperidone from [place], will not ship to [place] anymore. Patient wants to know is there anything else she can take.? Please advise.

## 2014-07-03 NOTE — Telephone Encounter (Signed)
Called patient back, gave patient the [place] pharmacy information Sheri had. I advised patient that the [place] pharmacy wants patient to contact them, we cannot per [facility]. I advised patient I will have to mail the prescription or she can come pick it up at our office. I advised patient that Domperidone can cause Cardiac issues like arrythmia and in worse cases sudden Cardiac death. I advised the patient being over the age 66 puts you at a higher risk so Dr. Hilarie Fredrickson request a EKG be done at her PCP office. Patient said she had no idea of the side effects of Domperidone and she will contact her PCP to schedule an office visit and EKG. Patient also requested an appointment with Dr. Hilarie Fredrickson to discuss Reglan or some kind of option to help with Gastroparesis.

## 2014-07-03 NOTE — Telephone Encounter (Signed)
There is a way to continue domperidone (I believe [facility] knows how) She also needs annual EKG which can be performed by primary care and faxed to [place] or by cardiology and faxed to [place] She should also have office follow-up annually at min

## 2014-07-03 NOTE — Telephone Encounter (Signed)
Pt states gi dr advised pt she needs ekg and heart study. Pt would like to know if you want to refer to heart dr or can you do this for pt?

## 2014-07-05 NOTE — Telephone Encounter (Signed)
Make an OV for [place] to evaluate this

## 2014-07-05 NOTE — Telephone Encounter (Signed)
appt scheduled

## 2014-07-06 ENCOUNTER — Encounter: Payer: Self-pay | Admitting: Family Medicine

## 2014-07-06 ENCOUNTER — Ambulatory Visit (INDEPENDENT_AMBULATORY_CARE_PROVIDER_SITE_OTHER): Payer: Medicare Other | Admitting: Family Medicine

## 2014-07-06 VITALS — BP 118/76 | HR 78 | Temp 99.1°F | Ht 62.0 in | Wt 172.0 lb

## 2014-07-06 DIAGNOSIS — R1310 Dysphagia, unspecified: Secondary | ICD-10-CM | POA: Diagnosis not present

## 2014-07-06 DIAGNOSIS — Z136 Encounter for screening for cardiovascular disorders: Secondary | ICD-10-CM

## 2014-07-06 DIAGNOSIS — K219 Gastro-esophageal reflux disease without esophagitis: Secondary | ICD-10-CM

## 2014-07-06 DIAGNOSIS — T50905A Adverse effect of unspecified drugs, medicaments and biological substances, initial encounter: Secondary | ICD-10-CM

## 2014-07-06 DIAGNOSIS — G20 Parkinson's disease: Secondary | ICD-10-CM

## 2014-07-06 NOTE — Progress Notes (Signed)
   Subjective:    Patient ID: Audrey Brown, female    DOB: [birth year], 68 y.o.   MRN: [id number]  HPI Here for an EKG and for a cardiac evaluation. She feels well and denies any SOB or chest pain or palpitations. She wants to use Domperidone to treat her dysphagia and esophageal dysfunction. However this can have cardiac side effects including arrhythmias, so Dr. Hilarie Fredrickson asked her to see [place]. Her last EKG prior to today was in Feb 2014, and it was normal.    Review of Systems  Constitutional: Negative.   Respiratory: Negative.   Cardiovascular: Negative.        Objective:   Physical Exam  Constitutional: She appears well-developed and well-nourished.  Cardiovascular: Normal rate, regular rhythm, normal heart sounds and intact distal pulses.   EKG normal   Pulmonary/Chest: Effort normal and breath sounds normal.          Assessment & Plan:  She has a stable cardiac status and I feel she is safe to use Domperidone. She will follow up with Dr. Hilarie Fredrickson.

## 2014-07-06 NOTE — Progress Notes (Signed)
Pre visit review using our clinic review tool, if applicable. No additional management support is needed unless otherwise documented below in the visit note. 

## 2014-07-10 ENCOUNTER — Ambulatory Visit (HOSPITAL_BASED_OUTPATIENT_CLINIC_OR_DEPARTMENT_OTHER): Payer: Medicare Other | Admitting: Physical Medicine & Rehabilitation

## 2014-07-10 ENCOUNTER — Encounter: Payer: Self-pay | Admitting: Physical Medicine & Rehabilitation

## 2014-07-10 ENCOUNTER — Encounter: Payer: Medicare Other | Attending: Physical Medicine & Rehabilitation

## 2014-07-10 VITALS — BP 131/60 | HR 80 | Resp 14 | Ht 63.0 in | Wt 170.0 lb

## 2014-07-10 DIAGNOSIS — F319 Bipolar disorder, unspecified: Secondary | ICD-10-CM | POA: Diagnosis not present

## 2014-07-10 DIAGNOSIS — G20 Parkinson's disease: Secondary | ICD-10-CM | POA: Diagnosis not present

## 2014-07-10 DIAGNOSIS — G20A1 Parkinson's disease without dyskinesia, without mention of fluctuations: Secondary | ICD-10-CM | POA: Insufficient documentation

## 2014-07-10 DIAGNOSIS — F0280 Dementia in other diseases classified elsewhere without behavioral disturbance: Secondary | ICD-10-CM | POA: Insufficient documentation

## 2014-07-10 DIAGNOSIS — K219 Gastro-esophageal reflux disease without esophagitis: Secondary | ICD-10-CM | POA: Diagnosis not present

## 2014-07-10 DIAGNOSIS — M47817 Spondylosis without myelopathy or radiculopathy, lumbosacral region: Secondary | ICD-10-CM

## 2014-07-10 DIAGNOSIS — G709 Myoneural disorder, unspecified: Secondary | ICD-10-CM | POA: Insufficient documentation

## 2014-07-10 DIAGNOSIS — G3185 Corticobasal degeneration: Secondary | ICD-10-CM | POA: Insufficient documentation

## 2014-07-10 DIAGNOSIS — R269 Unspecified abnormalities of gait and mobility: Secondary | ICD-10-CM | POA: Insufficient documentation

## 2014-07-10 MED ORDER — GABAPENTIN 600 MG PO TABS: 600.0000 mg | ORAL_TABLET | Freq: Three times a day (TID) | ORAL | Status: DC

## 2014-07-10 NOTE — Patient Instructions (Addendum)
You had a radio frequency procedure today This was done to alleviate joint pain in your lumbar area We injected a combination of dexamethasone which is a steroid as well as lidocaine which is a local anesthetic. Dexamethasone made increased blood sugars you are diabetic You may experience soreness at the injection sites. You may also experienced some irritation of the nerves that were heated I'm recommending ice for 30 minutes every 2 hours as needed for the next 24-48 hours In addition you will be taking gabapentin 600 mg 3 times a day today only if this is not one of your usual medicines, Pick up at [facility]

## 2014-07-10 NOTE — Progress Notes (Signed)
Left L5 dorsal ramus., left L4 and left L3, L2 medial branch radio frequency neuropathy under fluoroscopic guidance  Indication: Low back pain due to lumbar spondylosis which has been relieved on 2 occasions by greater than 50% by lumbar medial branch blocks at corresponding levels.  Informed consent was obtained after describing risks and benefits of the procedure with the patient, this includes bleeding, bruising, infection, paralysis and medication side effects. The patient wishes to proceed and has given written consent. The patient was placed in a prone position. The lumbar and sacral area was marked and prepped with Betadine. A 25-gauge 1-1/2 inch needle was inserted into the skin and subcutaneous tissue at 3 sites in one ML of 1% lidocaine was injected into each site. Then a 20-gauge 10cm cm radio frequency needle with a 1 cm curved active tip was inserted targeting the left S1 SAP/sacral ala junction. Bone contact was made and confirmed with lateral imaging. Sensory stimulation at 50 Hz followed by motor stimulation at 2 Hz confirm proper needle location followed by injection of one ML of the solution containing one ML of 4 mg per mL dexamethasone and 3 mL of 1% MPF lidocaine. Then the left L5 SAP/transverse process junction was targeted. Bone contact was made and confirmed with lateral imaging. Sensory stimulation at 50 Hz followed by motor stimulation at 2 Hz confirm proper needle location followed by injection of one ML of the solution containing one ML of 4 mg per mL dexamethasone and 3 mL of 1% MPF lidocaine. Then the left L4 SAP/transverse process junction was targeted. Bone contact was made and confirmed with lateral imaging. Sensory stimulation at 50 Hz followed by motor stimulation at 2 Hz confirm proper needle location followed by injection of one ML of the solution containing one ML of 4 mg per mL dexamethasone and 3 mL of 1% MPF lidocaine.Then the left L3 SAP/transverse process junction was  targeted. Bone contact was made and confirmed with lateral imaging. Sensory stimulation at 50 Hz followed by motor stimulation at 2 Hz confirm proper needle location followed by injection of one ML of the solution containing one ML of 4 mg per mL dexamethasone and 3 mL of 1% MPF lidocaine. Radio frequency lesion being at [facility] for 90 seconds was performed. Needles were removed. Post procedure instructions and vital signs were performed. Patient tolerated procedure well. Followup appointment was given.

## 2014-07-10 NOTE — Progress Notes (Signed)
PROCEDURE RECORD [facility]   Name: DEANNA HRITZ DOB:07/13/1946 MRN: [id number]  Date:07/10/2014  Physician: Claudette Laws, MD    Nurse/CMA: Jhan Conery CMA  Allergies:  Allergies  Allergen Reactions  . Celebrex [Celecoxib]     REACTION: hives  . Morphine And Related Hives  . Penicillins   . Risperdal [Risperidone]     Severe tremors  . Statins Other (See Comments)    Muscle aches     Consent Signed: Yes.    Is patient diabetic? No.  CBG today?   Pregnant: No. LMP: No LMP recorded. Patient is postmenopausal. (age 36-55)  Anticoagulants: no Anti-inflammatory: no Antibiotics: no  Procedure: Left L2-3-4-5 Radiofrequency Neurotomy Position: Prone Start Time: 124 End Time: 148 Fluoro Time:45  RN/CMA Parilee Hally CMA Jaquasia Doscher CMA    Time 1250 152    BP 131/60 144/65    Pulse 80 76    Respirations 14 14    O2 Sat 98 99    S/S 6 6    Pain Level 8/10 6/10     D/C home with Angelique Blonder, patient A & O X 3, D/C instructions reviewed, and sits independently.

## 2014-07-18 ENCOUNTER — Encounter: Payer: Self-pay | Admitting: Physical Medicine & Rehabilitation

## 2014-08-14 ENCOUNTER — Encounter: Payer: Medicare Other | Attending: Physical Medicine & Rehabilitation

## 2014-08-14 ENCOUNTER — Encounter: Payer: Self-pay | Admitting: Physical Medicine & Rehabilitation

## 2014-08-14 ENCOUNTER — Ambulatory Visit (HOSPITAL_BASED_OUTPATIENT_CLINIC_OR_DEPARTMENT_OTHER): Payer: Medicare Other | Admitting: Physical Medicine & Rehabilitation

## 2014-08-14 VITALS — BP 124/78 | HR 67 | Resp 14 | Ht 63.0 in | Wt 168.7 lb

## 2014-08-14 DIAGNOSIS — M5489 Other dorsalgia: Secondary | ICD-10-CM

## 2014-08-14 DIAGNOSIS — G20 Parkinson's disease: Secondary | ICD-10-CM | POA: Insufficient documentation

## 2014-08-14 DIAGNOSIS — R269 Unspecified abnormalities of gait and mobility: Secondary | ICD-10-CM

## 2014-08-14 DIAGNOSIS — M47817 Spondylosis without myelopathy or radiculopathy, lumbosacral region: Secondary | ICD-10-CM | POA: Insufficient documentation

## 2014-08-14 DIAGNOSIS — G8929 Other chronic pain: Secondary | ICD-10-CM | POA: Diagnosis not present

## 2014-08-14 DIAGNOSIS — M546 Pain in thoracic spine: Secondary | ICD-10-CM

## 2014-08-14 MED ORDER — OXYCODONE-ACETAMINOPHEN 5-325 MG PO TABS: 1.0000 | ORAL_TABLET | Freq: Every day | ORAL | Status: DC | PRN

## 2014-08-14 NOTE — Progress Notes (Signed)
Subjective:    Patient ID: Audrey Brown, female    DOB: October 19, 1946, 68 y.o.   MRN: [id number]  HPI Chief complaint is pain between the shoulder blades this pain is worse during the day  Not as much pain at night No significant neck pain. Pulling sensation in the neck  Tingling and numbness in both arms as well as the feet.    Left L2 L3-L4 medial branch and left L5 dorsal ramus radiofrequency ablation performed 07/10/2014 was helpful for low back Previous right L2 L3-L4 medial branch and right L5 dorsal ramus radiofrequency ablation performed 05/29/2014 was also helpful for low back Pain Inventory Average Pain 4 Pain Right Now 4 My pain is constant, sharp, dull and aching  In the last 24 hours, has pain interfered with the following? General activity 8 Relation with others 8 Enjoyment of life 10 What TIME of day is your pain at its worst? daytime Sleep (in general) Poor  Pain is worse with: walking, bending, sitting and standing Pain improves with: rest and heat/ice Relief from Meds: 9  Mobility use a wheelchair  Function retired  Neuro/Psych numbness tremor tingling confusion depression loss of taste or smell  Prior Studies Any changes since last visit?  no  Physicians involved in your care Any changes since last visit?  no   Family History  Problem Relation Age of Onset  . Emphysema Mother   . Heart attack Father   . Hypertension    . Lung cancer     History   Social History  . Marital Status: Widowed    Spouse Name: N/A    Number of Children: 4  . Years of Education: N/A   Occupational History  . Retired    Social History Main Topics  . Smoking status: Never Smoker   . Smokeless tobacco: Never Used  . Alcohol Use: No  . Drug Use: No  . Sexual Activity: None   Other Topics Concern  . None   Social History Narrative   Daughter is her POA and must come to all visits in [place] and [place]    Past Surgical History  Procedure Laterality  Date  . Tonsillectomy    . Cesarean section    . Cervical disc surgery  1994    c-6   . Wrist surgery  06-24-2010    Dr Rush Farmer right wrist  . Neck surgery      Lumpectomy  . Foot neuroma surgery     Past Medical History  Diagnosis Date  . Headache(784.0)   . GERD (gastroesophageal reflux disease)   . corticobasilar degeneration     sees Dr. Tonye Royalty at [facility]  . Osteoarthritis     has seen Dr Beola Cord  . Bipolar depression     had seen Dr. Candis Schatz in past   . Dementia   . Gallstones   . Neuromuscular disorder   . Hiatal hernia 07/27/2012    EGD   BP 124/78  Pulse 67  Resp 14  Ht 5\' 3"  (1.6 m)  Wt 168 lb 11.2 oz (76.522 kg)  BMI 29.89 kg/m2  SpO2 99%  Opioid Risk Score:   Fall Risk Score: Low Fall Risk (0-5 points)   Review of Systems     Objective:   Physical Exam  Nursing note and vitals reviewed. Constitutional: She appears well-developed and well-nourished.  HENT:  Head: Normocephalic and atraumatic.  Eyes: Conjunctivae and EOM are normal. Pupils are equal, round, and reactive to  light.  Neurological: She is alert. She displays tremor. She displays no atrophy. A sensory deficit is present. Coordination and gait abnormal.  Upper extremity tremor in both hands Masked face  Reports decreased sensation to light touch in bilateral hands  Bradykinesia Decreased standing balance  Psychiatric: Her affect is blunt. Her speech is delayed. She is slowed.   Minimal tenderness to palpation lumbar paraspinals Moderate tenderness palpation bilateral thoracic paraspinals between T4 and T7 areas.        Assessment & Plan:  1. Lumbar spondylosis improvement after radiofrequency procedure, may repeat in 4-5 months if needed  2. Chronic mid back pain has some mild degenerative changes on thoracic x-ray also may have some muscular pain related to prolonged immobility in seated position.  Patient takes one oxycodone per week have written a prescription  for 20 tablets which should last at least a couple months  Return to clinic in 2 months  3. Parkinson's disease with frequent falls will make referral to neuro rehabilitation for balance and Parkinson's program they can also address mid back pain

## 2014-08-17 ENCOUNTER — Other Ambulatory Visit: Payer: Self-pay | Admitting: Family Medicine

## 2014-08-23 ENCOUNTER — Telehealth: Payer: Self-pay | Admitting: Family Medicine

## 2014-08-23 NOTE — Telephone Encounter (Signed)
Pt would like a letter from dr fry stating her disability. Post office is trying to get the pt to get a mailbox install on her street instead of hand delivering her mail to her door. Pt has parkinsonism and can not maneuver her wheel chair to get to end of street. Pt does not have any one who can consistently get her mail for her

## 2014-08-24 NOTE — Telephone Encounter (Signed)
The letter is ready  

## 2014-08-24 NOTE — Telephone Encounter (Signed)
I spoke with pt and note is ready.

## 2014-08-30 ENCOUNTER — Encounter: Payer: Self-pay | Admitting: Internal Medicine

## 2014-08-30 ENCOUNTER — Ambulatory Visit (INDEPENDENT_AMBULATORY_CARE_PROVIDER_SITE_OTHER): Payer: Medicare Other | Admitting: Internal Medicine

## 2014-08-30 VITALS — BP 120/62 | HR 76 | Ht 63.0 in | Wt 165.0 lb

## 2014-08-30 DIAGNOSIS — R195 Other fecal abnormalities: Secondary | ICD-10-CM | POA: Diagnosis not present

## 2014-08-30 DIAGNOSIS — K3184 Gastroparesis: Secondary | ICD-10-CM

## 2014-08-30 DIAGNOSIS — R140 Abdominal distension (gaseous): Secondary | ICD-10-CM

## 2014-08-30 MED ORDER — METRONIDAZOLE 250 MG PO TABS
250.0000 mg | ORAL_TABLET | Freq: Three times a day (TID) | ORAL | Status: DC
Start: 2014-08-30 — End: 2015-03-19

## 2014-08-30 MED ORDER — AMBULATORY NON FORMULARY MEDICATION: Status: DC

## 2014-08-30 NOTE — Progress Notes (Signed)
Patient ID: Audrey Brown, female   DOB: 24-Sep-1946, 68 y.o.   MRN: [id number] HPI: Audrey Brown is a 68 yo female with a past medical history of gastroparesis, GERD, Parkinson's disease who is seen for followup. She was previously managed by Dr. Verl Blalock and is seeing me for the first time today. She is here with her daughter. She reports that she is having considerable issues with bloating and "fluid retention" in her stomach. This is always worse after eating. She's noted some issues with swallowing dry foods such as breads. She reports overall heartburn control has been good on Nexium twice daily. Her daughter states she does occasionally request [place] [facility] for breakthrough heartburn. She is having loose watery stools approximately 20 minutes after eating and they report as a somewhat long-standing. Stools have been nonbloody and non-melenic. She is using senna as a laxative.  She was taking domperidone but this medication prescription expired and she has been off of this medication for the last 2-3 months. She reports this medication helped tremendously for her in the past. She also would like to consider Reglan. She had her gallbladder out within the past 18 months for gallstones. She also had colorectal cancer screening 3 months ago at the [place] with Cologuard, which they report was negative.  She is followed by Dr. Tonye Royalty at [facility] for her Parkinson's disease.  Past Medical History  Diagnosis Date  . Headache(784.0)   . GERD (gastroesophageal reflux disease)   . corticobasilar degeneration     sees Dr. Tonye Royalty at [facility] Neurology  . Osteoarthritis     has seen Dr Beola Cord  . Bipolar depression     had seen Dr. Candis Schatz in past   . Dementia   . Gallstones   . Neuromuscular disorder   . Hiatal hernia 07/27/2012    EGD    Past Surgical History  Procedure Laterality Date  . Tonsillectomy    . Cesarean section    . Cervical disc surgery  1994    c-6   . Wrist surgery  06-24-2010     Dr Rush Farmer right wrist  . Neck surgery      Lumpectomy  . Foot neuroma surgery      Outpatient Prescriptions Prior to Visit  Medication Sig Dispense Refill  . Calcium 500 MG CHEW Chew 1 tablet by mouth daily.       Marland Kitchen esomeprazole (NEXIUM) 40 MG capsule Take 1 capsule (40 mg total) by mouth 2 (two) times daily.  180 capsule  6  . Eszopiclone (ESZOPICLONE) 3 MG TABS Take 1 tablet (3 mg total) by mouth at bedtime. Take immediately before bedtime  30 tablet  5  . ibuprofen (ADVIL,MOTRIN) 800 MG tablet TAKE 1 TABLET EVERY 8 HOURS AS NEEDED FOR PAIN  270 tablet  0  . LamoTRIgine XR (LAMICTAL XR) 200 MG TB24 Take 1 tablet (200 mg total) by mouth daily.  90 tablet  1  . Magnesium 500 MG CAPS Take 1 capsule by mouth daily.      Marland Kitchen oxyCODONE-acetaminophen (PERCOCET/ROXICET) 5-325 MG per tablet Take 1 tablet by mouth daily as needed for severe pain.  20 tablet  0  . promethazine (PHENERGAN) 25 MG tablet Take 25 mg by mouth every 4 (four) hours as needed for nausea.       . AMBULATORY NON FORMULARY MEDICATION Domperidone 10 mg Please take one tablet by mouth four times daily before meals and at bedtime  120 tablet  2  . buPROPion (  WELLBUTRIN SR) 200 MG 12 hr tablet Take 1 tablet (200 mg total) by mouth daily.  90 tablet  1  . gabapentin (NEURONTIN) 600 MG tablet Take 1 tablet (600 mg total) by mouth 3 (three) times daily.  3 tablet  1  . ondansetron (ZOFRAN) 4 MG tablet Take 4 mg by mouth every 8 (eight) hours as needed for nausea or vomiting.       No facility-administered medications prior to visit.    Allergies  Allergen Reactions  . Celebrex [Celecoxib]     REACTION: hives  . Morphine And Related Hives  . Penicillins   . Risperdal [Risperidone]     Severe tremors  . Statins Other (See Comments)    Muscle aches     Family History  Problem Relation Age of Onset  . Emphysema Mother   . Heart attack Father   . Hypertension    . Lung cancer      History  Substance Use Topics  .  Smoking status: Never Smoker   . Smokeless tobacco: Never Used  . Alcohol Use: No    ROS: As per history of present illness, otherwise negative  BP 120/62  Pulse 76  Ht 5\' 3"  (1.6 m)  Wt 165 lb (74.844 kg)  BMI 29.24 kg/m2 Constitutional: Well-developed and well-nourished. No distress. HEENT: Normocephalic and atraumatic. Oropharynx is clear and moist. No oropharyngeal exudate. Conjunctivae are normal.  No scleral icterus. Neck: Neck supple. Trachea midline. Cardiovascular: Normal rate, regular rhythm and intact distal pulses. No M/R/G Pulmonary/chest: Effort normal and breath sounds normal. No wheezing, rales or rhonchi. Abdominal: Soft, nontender, nondistended. Bowel sounds active throughout. There are no masses palpable. No hepatosplenomegaly. Extremities: no clubbing, cyanosis, or edema Lymphadenopathy: No cervical adenopathy noted. Neurological: Alert and oriented to person place and time. Skin: Skin is warm and dry. No rashes noted. Psychiatric: Normal mood and affect. Behavior is normal.  RELEVANT LABS AND IMAGING: CBC    Component Value Date/Time   WBC 7.9 11/23/2013 1200   RBC 4.71 11/23/2013 1200   HGB 14.0 11/23/2013 1200   HCT 41.5 11/23/2013 1200   PLT 304.0 11/23/2013 1200   MCV 88.1 11/23/2013 1200   MCH 30.0 04/10/2013 1400   MCHC 33.7 11/23/2013 1200   RDW 13.7 11/23/2013 1200   LYMPHSABS 2.9 11/23/2013 1200   MONOABS 0.5 11/23/2013 1200   EOSABS 0.2 11/23/2013 1200   BASOSABS 0.0 11/23/2013 1200    CMP     Component Value Date/Time   NA 144 11/23/2013 1200   K 5.1 11/23/2013 1200   CL 109 11/23/2013 1200   CO2 28 11/23/2013 1200   GLUCOSE 114* 11/23/2013 1200   BUN 11 11/23/2013 1200   CREATININE 0.8 11/23/2013 1200   CALCIUM 9.3 11/23/2013 1200   PROT 6.7 11/23/2013 1200   ALBUMIN 4.0 11/23/2013 1200   AST 16 11/23/2013 1200   ALT 19 11/23/2013 1200   ALKPHOS 61 11/23/2013 1200   BILITOT 0.7 11/23/2013 1200   GFRNONAA >90 04/10/2013 1400   GFRAA >90 04/10/2013 1400    Last upper endoscopy September 2013 -- 5 cm hiatal hernia, 84 French Maloney dilator passed, biopsies done negative for eosinophilic esophagitis, dysplasia or malignancy. No Barrett's esophagus. Normal appearing stomach, clo negative.  ASSESSMENT/PLAN: 68 yo female with a past medical history of gastroparesis, GERD, Parkinson's disease who is seen for followup.   1. GERD/gastroparesis/HH/gas and bloating -- her gastroparesis has been a big problem for her in the past and it  responded very well to domperidone. We discussed Reglan at length today and I am uncomfortable using this medication due to her Parkinson's disease and the neurologic side effects which can occur with metoclopramide therapy. I do think she would benefit from reinitiation of domperidone. We will prescribe 10 mg before meals and at bedtime. I feel that domperidone will help in gastric emptying and likely improve GERD and swallowing symptoms. Her large hiatal hernia is felt to be contributing to her dysphagia and domperidone can help with proximal gastric emptying as well. Previous dilation did not improve dysphagia symptoms. Some of her dysphagia is likely secondary to Parkinson's. He will continue Nexium 40 mg twice daily for now. See #2  2. Loose stools -- she is at risk for bacterial overgrowth and I will treat with metronidazole 250 mg 3 times daily for 10 days. This may also improve gas and bloating symptoms. Postcholecystectomy bile salt related diarrhea is also a possibility. We'll treat with antibiotics as discussed and if postprandial loose stools continue we will consider cholestyramine. I have recommended stopping Senokot.  3. CRC screening -- reportedly negative Cologuard, repeat in 3 years  Followup in 6-8 weeks

## 2014-08-30 NOTE — Patient Instructions (Signed)
We have sent the following medications to your pharmacy for you to pick up at your convenience: Metronidazole  In order to refill your Domperidone, please call the [facility] at [phone].  They will give you a confirmation number that you will need to call give to me.  At that point I can fax them the prescription and they will mail it to you.  Continue your Nexium twice a day.  Please follow up with Amy Esterwood in 6-8 weeks.

## 2014-10-03 ENCOUNTER — Encounter: Payer: Self-pay | Admitting: Physical Therapy

## 2014-10-03 ENCOUNTER — Ambulatory Visit: Payer: Medicare Other | Attending: Physical Medicine & Rehabilitation | Admitting: Physical Therapy

## 2014-10-03 DIAGNOSIS — Z5189 Encounter for other specified aftercare: Secondary | ICD-10-CM | POA: Insufficient documentation

## 2014-10-03 DIAGNOSIS — R269 Unspecified abnormalities of gait and mobility: Secondary | ICD-10-CM | POA: Diagnosis not present

## 2014-10-03 DIAGNOSIS — M6281 Muscle weakness (generalized): Secondary | ICD-10-CM | POA: Diagnosis not present

## 2014-10-03 NOTE — Therapy (Signed)
Physical Therapy Evaluation  Patient Details  Name: Audrey Brown MRN: [id number] Date of Birth: 1946-04-02  Encounter Date: 10/03/2014      PT End of Session - 10/03/14 1414    Visit Number 1  G1   Number of Visits 9   Date for PT Re-Evaluation 12/02/14   PT Start Time 1308   PT Stop Time 1401   PT Time Calculation (min) 40 min   Equipment Utilized During Treatment Gait belt   Activity Tolerance Patient limited by pain;Patient limited by fatigue      Past Medical History  Diagnosis Date  . Headache(784.0)   . GERD (gastroesophageal reflux disease)   . corticobasilar degeneration     sees Dr. Tonye Royalty at [facility]  . Osteoarthritis     has seen Dr Beola Cord  . Bipolar depression     had seen Dr. Candis Schatz in past   . Dementia   . Gallstones   . Neuromuscular disorder   . Hiatal hernia 07/27/2012    EGD    Past Surgical History  Procedure Laterality Date  . Tonsillectomy    . Cesarean section    . Cervical disc surgery  1994    c-6   . Wrist surgery  06-24-2010    Dr Rush Farmer right wrist  . Neck surgery      Lumpectomy  . Foot neuroma surgery      There were no vitals taken for this visit.  Visit Diagnosis:  Abnormality of gait - Plan: PT plan of care cert/re-cert  Generalized muscle weakness - Plan: PT plan of care cert/re-cert      Subjective Assessment - 10/03/14 1325    Symptoms Pt presents to OP PT with diagnosis of Parkinsonsism with increased tremors in bilateral lower extremities.  Pt reports having 19 falls in 3 years, 2 falls in the past 6 months.  Reports falls occurred without warning, with legs buckling.  She uses wheelchair for primary mobility.  She walks short distances, holding on for support.  She can stand for several minutes at a time only, due to back pain.   Patient Stated Goals Improve strength in legs, reduce pain   Currently in Pain? Yes   Pain Score 8    Pain Location Back  shoulder blades to lumbar spine   Pain  Orientation Mid;Lower   Pain Descriptors / Indicators Dull;Aching   Pain Type Chronic pain   Pain Onset More than a month ago   Pain Frequency Intermittent   Aggravating Factors  Standing too long aggravates pain; getting in and out of car alleviates   Pain Relieving Factors Lying down alleviates pain with pillows under knees          [facility] PT Assessment - 10/03/14 1333    Assessment   Medical Diagnosis Parkinsonism   Onset Date --  2010   Prior Therapy previous HHPT and HHOT   Precautions   Precautions Fall  No driving; avoid lifting heavy objects   Balance Screen   Has the patient fallen in the past 6 months Yes   How many times? 2   Has the patient had a decrease in activity level because of a fear of falling?  Yes   Is the patient reluctant to leave their home because of a fear of falling?  Yes   [place] Private residence   Living Arrangements Children  grandchildren   Available Help at Discharge Family   Type of  [place] entrance   [place] - 2 wheels;Shower seat;Wheelchair - manual;Grab bars - tub/shower;Toilet riser  x 2, keeps on different floors; stair lifts   Prior Function   Level of Independence Needs assistance with ADLs;Needs assistance with homemaking;Needs assistance with gait   Observation/Other Assessments   Focus on Therapeutic Outcomes (FOTO)  Functional Status intake score:  23; ABC Scale 5%   Strength   Overall Strength Deficits   Overall Strength Comments difficult to assess due to increased tremors upon MMT; pt reported decreased LLE strength   Right Hip Flexion 3-/5   Left Hip Flexion 3-/5   Right Knee Flexion 3-/5   Right Knee Extension 3/5   Left Knee Flexion 3-/5   Left Knee Extension 3/5   Right Ankle Dorsiflexion 3-/5   Left Ankle Dorsiflexion 3-/5   Transfers   Transfers Sit to Stand;Stand to Sit   Sit to Stand 4: Min guard  Places UEs on  walker to stand   Stand to Sit 4: Min guard;With upper extremity assist   Ambulation/Gait   Ambulation/Gait Yes  gait assessed during TUG only   Ambulation/Gait Assistance 4: Min assist   Ambulation Distance (Feet) 10 Feet  ft x 2   Assistive device Rolling walker   Gait Pattern Decreased hip/knee flexion - right;Decreased hip/knee flexion - left;Decreased dorsiflexion - right;Decreased dorsiflexion - left;Wide base of support  forward lean onto walker   Balance   Balance Assessed Yes   Static Standing Balance   Static Standing - Balance Support Bilateral upper extremity supported   Static Standing - Level of Assistance 4: Min assist   Static Standing Balance -  Activities  --  attempted stand without UE support-pt unable with post. lean   Static Standing - Comment/# of Minutes stands with UE supported 1:30   Standardized Balance Assessment   Standardized Balance Assessment Berg Balance Test  Unable to complete Berg, as pt unable to stand unsupported   [employer]   Sit to Stand Able to stand  independently using hands   Standing Unsupported Unable to stand 30 seconds unassisted  posterior loss of balance   Sitting with Back Unsupported but Feet Supported on Floor or Stool Able to sit safely and securely 2 minutes   Stand to Sit Needs assistance to sit  Hold LLE in extension in the air during stand to sit.   Transfers Needs two people to assist of supervise to be safe                PT Long Term Goals - 10/03/14 1421    PT LONG TERM GOAL #1   Title Pt will perform HEP with family/caregiver supervision, to improve strength, balance, reduction of low back pain.   Time 4   Period Weeks   Status New   PT LONG TERM GOAL #2   Title Pt will verbalize/demonstrate understanding of fall prevention techniques within home environment.   Time 4   Period Weeks   Status New   PT LONG TERM GOAL #3   Title Pt will be able to stand at counter supported with UE support, for at  least 5 minutes, for improved standing tolerance and improvement in ADLs.   Time 4   Period Weeks   Status New   PT LONG TERM GOAL #4   Title Pt will be able to stand unsupported at least 1 minute with supervision,to demonstrate improved  standing balance.   Time 4   Period Weeks   Status New   PT LONG TERM GOAL #5   Title Pt will be able to improve TUG score to less than or equal to 45 seconds for decreased fall risk.   Time 4   Period Weeks   Status New          Plan - 17-Oct-2014 1415    Clinical Impression Statement Pt is a 68 year old female who presents to OP PT with Parkinsonism, corticobasilar degeneration, with history of OA, dementia, bipolar disorder, back pain/lumbar compression fracture (per daughter report).  She has had a second nerve cauterization in back to attempt to alleviate pain; however, pt feels it didn't work as good this time.  Pt presents to PT with desires to have better standing tolerance and get stronger.  She has had 2 falls in the past 6 months.  She is able to stand for 90 seconds prior to needing to sit.  She is unable to stand without UE support due to strong posterior lean.     Pt will benefit from skilled therapeutic intervention in order to improve on the following deficits Abnormal gait;Decreased activity tolerance;Decreased balance;Decreased cognition;Decreased mobility;Decreased safety awareness;Decreased strength;Difficulty walking   Rehab Potential Good   PT Frequency 2x / week   PT Duration 4 weeks   PT Treatment/Interventions ADLs/Self Care Home Management;Gait training;Functional mobility training;Neuromuscular re-education;Balance training;Therapeutic exercise;Therapeutic activities;Patient/family education   PT Next Visit Plan Initiate HEP, standing tolerance with lateral and anterior/posterior weightshifting at counter   PT Home Exercise Plan HEP trial to include low back flexibility exercises, pelvic tilts, standing balance at counter, standing  weightshifts at counter   Consulted and Agree with Plan of Care Patient;Family member/caregiver          G-Codes - 2014/10/17 1424    Functional Assessment Tool Used TUG (53.85 seconds), standing supported 90 seconds; unable to stand unsupported   Functional Limitation Mobility: Walking and moving around   Mobility: Walking and Moving Around Current Status [phone]) At least 80 percent but less than 100 percent impaired, limited or restricted   Mobility: Walking and Moving Around Goal Status [phone]) At least 60 percent but less than 80 percent impaired, limited or restricted      Problem List Patient Active Problem List   Diagnosis Date Noted  . Chronic mid back pain 08/14/2014  . 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 Abnormality of gait 04/10/2014  . Lumbosacral spondylosis without myelopathy 04/10/2014  . Dysphagia, unspecified(787.20) 06/16/2011  . GERD with stricture 06/16/2011  . Bipolar 1 disorder, mixed 06/16/2011  . Parkinsonian syndrome 06/16/2011  . COMPRESSION FRACTURE, LUMBAR VERTEBRAE 11/26/2010  . PARKINSONISM 11/13/2010  . TINEA CORPORIS 07/31/2010  . CELLULITIS, LEGS 07/31/2010  . RESTING TREMOR 07/31/2010  . DEPENDENT EDEMA 06/25/2009  . GERD 11/19/2008  . WEAKNESS 11/19/2008  . DEPRESSION 01/02/2008  . HEADACHE 01/02/2008  . CHICKENPOX, HX OF 01/02/2008                                              MARRIOTT,AMY W. 10/17/2014, 2:31 PM      Amy Gerrit Friends, PT 17-Oct-2014 2:32 PM Phone: [phone] Fax: [phone]

## 2014-10-08 ENCOUNTER — Ambulatory Visit: Payer: Medicare Other

## 2014-10-08 DIAGNOSIS — M6281 Muscle weakness (generalized): Secondary | ICD-10-CM

## 2014-10-08 DIAGNOSIS — R269 Unspecified abnormalities of gait and mobility: Secondary | ICD-10-CM | POA: Diagnosis not present

## 2014-10-08 DIAGNOSIS — Z5189 Encounter for other specified aftercare: Secondary | ICD-10-CM | POA: Diagnosis not present

## 2014-10-08 NOTE — Therapy (Signed)
Physical Therapy Treatment  Patient Details  Name: Audrey Brown MRN: [id number] Date of Birth: 1946-03-28  Encounter Date: 10/08/2014      PT End of Session - 10/08/14 1612    Visit Number 2   Number of Visits 9   Date for PT Re-Evaluation 12/02/14   PT Start Time 1237   PT Stop Time 1315   PT Time Calculation (min) 38 min      Past Medical History  Diagnosis Date  . Headache(784.0)   . GERD (gastroesophageal reflux disease)   . corticobasilar degeneration     sees Dr. Tonye Royalty at [facility]  . Osteoarthritis     has seen Dr Beola Cord  . Bipolar depression     had seen Dr. Candis Schatz in past   . Dementia   . Gallstones   . Neuromuscular disorder   . Hiatal hernia 07/27/2012    EGD    Past Surgical History  Procedure Laterality Date  . Tonsillectomy    . Cesarean section    . Cervical disc surgery  1994    c-6   . Wrist surgery  06-24-2010    Dr Rush Farmer right wrist  . Neck surgery      Lumpectomy  . Foot neuroma surgery      There were no vitals taken for this visit.  Visit Diagnosis:  Abnormality of gait  Generalized muscle weakness      Subjective Assessment - 10/08/14 1236    Symptoms I'm making it. 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 Yes   Pain Score 6       Therapy session-taught and provided HEP. See pt instructions and education for details        PT Education - 10/08/14 1611    Education provided Yes   Education Details HEP-see pt instructions   Person(s) Educated Patient   Methods Explanation;Handout;Demonstration   Comprehension Verbalized understanding;Returned demonstration            PT Long Term Goals - 10/08/14 1616    PT LONG TERM GOAL #1   Title Pt will perform HEP with family/caregiver supervision, to improve strength, balance, reduction of low back pain.   Time 4   Period Weeks   Status New   PT LONG TERM GOAL #2   Title Pt will  verbalize/demonstrate understanding of fall prevention techniques within home environment.   Time 4   Period Weeks   Status New   PT LONG TERM GOAL #3   Title Pt will be able to stand at counter supported with UE support, for at least 5 minutes, for improved standing tolerance and improvement in ADLs.   Time 4   Period Weeks   Status New   PT LONG TERM GOAL #4   Title Pt will be able to stand unsupported at least 1 minute with supervision,to demonstrate improved standing balance.   Time 4   Period Weeks   Status New   PT LONG TERM GOAL #5   Title Pt will be able to improve TUG score to less than or equal to 45 seconds for decreased fall risk.   Time 4   Period Weeks   Status New          Plan - 10/08/14 1612    Clinical Impression Statement Pt is somewhat limited by pain. Gentle movements meant to reduce pain, seem to exacerbate her pain. This may be further  aggrevated by her apparent level of anxiety about movement. If pt is able to begin moving with decreaesd anxiety she will likely notice an improvement in the pain. In addition, pt's core is weak along with BLE. Emphasis today was on providing a HEP.    PT Next Visit Plan Add standing tolerance and lateral and anter/posterior weight shifting at counter to HEP   Consulted and Agree with Plan of Care Patient        Problem List Patient Active Problem List   Diagnosis Date Noted  . Chronic mid back pain 08/14/2014  . 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 Abnormality of gait 04/10/2014  . Lumbosacral spondylosis without myelopathy 04/10/2014  . Dysphagia, unspecified(787.20) 06/16/2011  . GERD with stricture 06/16/2011  . Bipolar 1 disorder, mixed 06/16/2011  . Parkinsonian syndrome 06/16/2011  . COMPRESSION FRACTURE, LUMBAR VERTEBRAE 11/26/2010  . PARKINSONISM 11/13/2010  . TINEA CORPORIS 07/31/2010  . CELLULITIS, LEGS 07/31/2010  . RESTING TREMOR 07/31/2010  . DEPENDENT EDEMA 06/25/2009  . GERD  11/19/2008  . WEAKNESS 11/19/2008  . DEPRESSION 01/02/2008  . HEADACHE 01/02/2008  . CHICKENPOX, HX OF 01/02/2008                                              Delrae Sawyers D 10/08/2014, 4:17 PM

## 2014-10-08 NOTE — Patient Instructions (Signed)
Knee to Chest (Flexion)   Pull knee toward chest. Feel stretch in lower back or buttock area. Breathing deeply, Hold 30 seconds. Repeat with other knee. Repeat 3 times daily.  http://gt2.exer.us/226   Copyright  VHI. All rights reserved.  PELVIC TILT: Anterior   Start in slumped position. Roll pelvis forward to arch back. 10 reps per set, 2 sets per day, 5-7 Bracing With Bridging (Hook-Lying)   With neutral spine, tighten pelvic floor and abdominals and hold. Lift bottom. Repeat ___ times. Do ___ times a day.   Copyright  VHI. All rights reserved.  days per week   Copyright  VHI. All rights reserved.     PWR! Step Sitting Transition exercise: 10x each direction daily. See other handout   SIT TO STAND: 1. Scoot to edge of chair 2. Position feet apart and feet behind knees 3. Lean forward, leading with your chest (stick bottom out). 4. Push up from your chair with hands and legs. Perform 10x daily with a chair in front of you to hold on as needed.

## 2014-10-10 ENCOUNTER — Ambulatory Visit: Payer: Medicare Other | Admitting: Physical Therapy

## 2014-10-15 ENCOUNTER — Encounter: Payer: Medicare Other | Attending: Physical Medicine & Rehabilitation

## 2014-10-15 ENCOUNTER — Ambulatory Visit: Payer: Medicare Other | Attending: Physical Medicine & Rehabilitation | Admitting: Physical Therapy

## 2014-10-15 ENCOUNTER — Other Ambulatory Visit: Payer: Self-pay | Admitting: Physical Medicine & Rehabilitation

## 2014-10-15 ENCOUNTER — Ambulatory Visit (HOSPITAL_BASED_OUTPATIENT_CLINIC_OR_DEPARTMENT_OTHER): Payer: Medicare Other | Admitting: Physical Medicine & Rehabilitation

## 2014-10-15 ENCOUNTER — Encounter: Payer: Self-pay | Admitting: Physical Therapy

## 2014-10-15 ENCOUNTER — Encounter: Payer: Self-pay | Admitting: Physical Medicine & Rehabilitation

## 2014-10-15 VITALS — HR 82 | Resp 14 | Ht 63.0 in | Wt 170.0 lb

## 2014-10-15 DIAGNOSIS — M5489 Other dorsalgia: Secondary | ICD-10-CM | POA: Insufficient documentation

## 2014-10-15 DIAGNOSIS — G894 Chronic pain syndrome: Secondary | ICD-10-CM | POA: Diagnosis not present

## 2014-10-15 DIAGNOSIS — G20 Parkinson's disease: Secondary | ICD-10-CM | POA: Diagnosis not present

## 2014-10-15 DIAGNOSIS — R269 Unspecified abnormalities of gait and mobility: Secondary | ICD-10-CM | POA: Diagnosis not present

## 2014-10-15 DIAGNOSIS — G8929 Other chronic pain: Secondary | ICD-10-CM | POA: Insufficient documentation

## 2014-10-15 DIAGNOSIS — M6281 Muscle weakness (generalized): Secondary | ICD-10-CM | POA: Diagnosis not present

## 2014-10-15 DIAGNOSIS — Z5181 Encounter for therapeutic drug level monitoring: Secondary | ICD-10-CM

## 2014-10-15 DIAGNOSIS — Z79899 Other long term (current) drug therapy: Secondary | ICD-10-CM

## 2014-10-15 DIAGNOSIS — M47817 Spondylosis without myelopathy or radiculopathy, lumbosacral region: Secondary | ICD-10-CM | POA: Diagnosis not present

## 2014-10-15 DIAGNOSIS — Z5189 Encounter for other specified aftercare: Secondary | ICD-10-CM | POA: Diagnosis not present

## 2014-10-15 MED ORDER — OXYCODONE-ACETAMINOPHEN 5-325 MG PO TABS: 1.0000 | ORAL_TABLET | Freq: Every day | ORAL | Status: DC | PRN

## 2014-10-15 NOTE — Progress Notes (Signed)
Subjective:    Patient ID: Audrey Brown, female    DOB: 22-Jan-1946, 68 y.o.   MRN: [id number]  HPI Left L2 L3-L4 medial branch and left L5 dorsal ramus radiofrequency ablation performed 07/10/2014 was helpful for low back  Previous right L2 L3-L4 medial branch and right L5 dorsal ramus radiofrequency ablation performed 05/29/2014 was also helpful for low back  Started PT last week.  Doing work on standing Practicing car transfers Pain Inventory Average Pain 7 Pain Right Now 8 My pain is constant and dull  In the last 24 hours, has pain interfered with the following? General activity 7 Relation with others 8 Enjoyment of life 10 What TIME of day is your pain at its worst? morning, daytime and evening Sleep (in general) Poor  Pain is worse with: walking, bending, sitting and standing Pain improves with: rest, heat/ice and medication Relief from Meds: 8  Mobility walk with assistance how many minutes can you walk? 2 ability to climb steps?  no do you drive?  no use a wheelchair  Function retired  Neuro/Psych numbness tremor tingling trouble walking spasms dizziness confusion depression loss of taste or smell  Prior Studies Any changes since last visit?  no  Physicians involved in your care Any changes since last visit?  no   Family History  Problem Relation Age of Onset  . Emphysema Mother   . Heart attack Father   . Hypertension    . Lung cancer     History   Social History  . Marital Status: Widowed    Spouse Name: N/A    Number of Children: 4  . Years of Education: N/A   Occupational History  . Retired    Social History Main Topics  . Smoking status: Never Smoker   . Smokeless tobacco: Never Used  . Alcohol Use: No  . Drug Use: No  . Sexual Activity: None   Other Topics Concern  . None   Social History Narrative   Daughter is her POA and must come to all visits in [place] and [place]    Past Surgical History  Procedure Laterality Date   . Tonsillectomy    . Cesarean section    . Cervical disc surgery  1994    c-6   . Wrist surgery  06-24-2010    Dr Rush Farmer right wrist  . Neck surgery      Lumpectomy  . Foot neuroma surgery     Past Medical History  Diagnosis Date  . Headache(784.0)   . GERD (gastroesophageal reflux disease)   . corticobasilar degeneration     sees Dr. Tonye Royalty at [facility]  . Osteoarthritis     has seen Dr Beola Cord  . Bipolar depression     had seen Dr. Candis Schatz in past   . Dementia   . Gallstones   . Neuromuscular disorder   . Hiatal hernia 07/27/2012    EGD   Pulse 82  Resp 14  Ht 5\' 3"  (1.6 m)  Wt 170 lb (77.111 kg)  BMI 30.12 kg/m2  SpO2 97%  Opioid Risk Score:   Fall Risk Score:    Review of Systems  Constitutional: Negative.   HENT: Negative.   Eyes: Negative.   Respiratory: Negative.   Cardiovascular: Negative.   Gastrointestinal: Negative.   Endocrine: Negative.   Genitourinary: Negative.   Musculoskeletal: Positive for back pain.  Skin: Negative.   Allergic/Immunologic: Negative.   Neurological: Positive for dizziness, syncope and numbness.  Tingling, spasms, confusion  Hematological: Negative.   Psychiatric/Behavioral: Positive for dysphoric mood.       Objective:   Physical Exam  Constitutional: She is oriented to person, place, and time. She appears well-developed and well-nourished.  HENT:  Head: Normocephalic and atraumatic.  Eyes: Conjunctivae are normal. Pupils are equal, round, and reactive to light.  Neurological: She is alert and oriented to person, place, and time.  Nursing note and vitals reviewed.   Patient sitting in wheelchair. Tenderness to palpation bilateral thoracic paraspinals as well as upper trapezius as well as infraspinatus region Mild tenderness to palpation in the lumbar spine. No significant pain with either forward flexion or extension within the confines of the wheelchair. Mood and affect are appropriate        Assessment & Plan:  1. Lumbar spondylosis improvement after radiofrequency procedure, may repeat right side in 1-2 months if needed Do not think RF has worn off, Right side should wear off first  2. Chronic mid back pain has some mild degenerative changes on thoracic x-ray also may have some muscular pain related to prolonged immobility in seated position.  Patient takes one oxycodone per week have written a prescription for 20 tablets which should last at least a couple months  Rec rowing exercise with theraband Heat to upper back up to 53min 3 times per day Can PT Return to clinic in 2 months  3. Parkinson's disease with frequent falls will make referral to neuro rehabilitation for balance and Parkinson's program they can also address mid back pain

## 2014-10-15 NOTE — Patient Instructions (Signed)
Rec rowing exercise with theraband Heat to upper back up to 27min 3 times per day

## 2014-10-15 NOTE — Therapy (Signed)
[facility] [address] [place], [place], [address] Phone: [phone]   Fax:  [phone]  Physical Therapy Treatment  Patient Details  Name: Audrey Brown MRN: [id number] Date of Birth: 1946-01-19  Encounter Date: 10/15/2014      PT End of Session - 10/15/14 1702    Visit Number 3   Number of Visits 9   Date for PT Re-Evaluation 12/02/14   PT Start Time 1407   PT Stop Time 1447   PT Time Calculation (min) 40 min   Equipment Utilized During Treatment Gait belt   Activity Tolerance Patient limited by fatigue;Patient limited by pain      Past Medical History  Diagnosis Date  . Headache(784.0)   . GERD (gastroesophageal reflux disease)   . corticobasilar degeneration     sees Dr. Zola Button at [facility]  . Osteoarthritis     has seen Dr Lestine Box  . Bipolar depression     had seen Dr. Tomasa Rand in past   . Dementia   . Gallstones   . Neuromuscular disorder   . Hiatal hernia 07/27/2012    EGD    Past Surgical History  Procedure Laterality Date  . Tonsillectomy    . Cesarean section    . Cervical disc surgery  1994    c-6   . Wrist surgery  06-24-2010    Dr Rayburn Ma right wrist  . Neck surgery      Lumpectomy  . Foot neuroma surgery      There were no vitals taken for this visit.  Visit Diagnosis:  Abnormality of gait  Generalized muscle weakness      Subjective Assessment - 10/15/14 1407    Symptoms Noticing that my left arm is weaker when I need to propel my wheelchair by myself for long distances.  Also notice that I lean more toward R side.   Currently in Pain? Yes   Pain Score 7    Pain Location Back   Pain Orientation Mid;Lower   Pain Descriptors / Indicators Aching;Dull   Pain Type Chronic pain   Pain Onset More than a month ago   Aggravating Factors  Increased activity aggravates pain   Pain Relieving Factors Lying down, heatin gpad and pillows under legs alleviate pain.             [facility] Adult PT Treatment/Exercise - 10/15/14 1414    High Level Balance   High Level Balance Activities Other (comment)   High Level Balance Comments Standing at chair:  Lateral weightshifting 2 sets x 10 reps, diagonal position weight shifting anterior and posterior 10 reps each holding UE support to chair.  Pt sits several times due to c/o incr. LLE tremor and dizzy   Exercises   Exercises Lumbar;Balance   Lumbar Exercises: Stretches   Single Knee to Chest Stretch 3 reps;30 seconds   Lumbar Exercises: Seated   Sit to Stand 5 reps  from elevated height; pt cautious during transfer   Sit to Stand Limitations initial reminders for technique; pt requires extra time   Other Seated Lumbar Exercises anterior pelvic tilts x 10 reps    Lumbar Exercises: Supine   Bridge 10 reps;3 seconds              PT Long Term Goals - 10/15/14 1704    PT LONG TERM GOAL #1   Title Pt will perform HEP with family/caregiver supervision, to improve strength, balance, reduction of low back pain.   Time  4   Period Weeks   Status On-going   PT LONG TERM GOAL #2   Title Pt will verbalize/demonstrate understanding of fall prevention techniques within home environment.   Time 4   Period Weeks   Status On-going   PT LONG TERM GOAL #3   Title Pt will be able to stand at counter supported with UE support, for at least 5 minutes, for improved standing tolerance and improvement in ADLs.   Time 4   Period Weeks   Status On-going   PT LONG TERM GOAL #4   Title Pt will be able to stand unsupported at least 1 minute with supervision,to demonstrate improved standing balance.   Time 4   Period Weeks   Status On-going   PT LONG TERM GOAL #5   Time 4   Period Weeks   Status On-going          Plan - 10/15/14 1703    Clinical Impression Statement Pt requires extra time to complete HEP exercises as well as seated and standing exercises.  Pt does not c/o increased pain, but does have some c/o "dizziness"  during standing exercises, which is resolved with sitting and focusing on visual target.   Pt will benefit from skilled therapeutic intervention in order to improve on the following deficits Abnormal gait;Decreased activity tolerance;Decreased balance;Decreased cognition;Decreased mobility;Decreased safety awareness;Decreased strength;Difficulty walking   Rehab Potential Good   PT Frequency 2x / week   PT Duration 4 weeks   PT Treatment/Interventions ADLs/Self Care Home Management;Gait training;Functional mobility training;Neuromuscular re-education;Balance training;Therapeutic exercise;Therapeutic activities;Patient/family education   PT Next Visit Plan Add standing tolerance and lateral and anter/posterior weight shifting at counter to HEP                      [facility]) - 10/15/14 1659    PWR! exercises Moves in sitting   PWR! Step 2 reps, requiring extra time and cues                 Problem List Patient Active Problem List   Diagnosis Date Noted  . Chronic mid back pain 08/14/2014  . 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 Abnormality of gait 04/10/2014  . Lumbosacral spondylosis without myelopathy 04/10/2014  . Dysphagia, unspecified(787.20) 06/16/2011  . GERD with stricture 06/16/2011  . Bipolar 1 disorder, mixed 06/16/2011  . Parkinsonian syndrome 06/16/2011  . COMPRESSION FRACTURE, LUMBAR VERTEBRAE 11/26/2010  . PARKINSONISM 11/13/2010  . TINEA CORPORIS 07/31/2010  . CELLULITIS, LEGS 07/31/2010  . RESTING TREMOR 07/31/2010  . DEPENDENT EDEMA 06/25/2009  . GERD 11/19/2008  . WEAKNESS 11/19/2008  . DEPRESSION 01/02/2008  . HEADACHE 01/02/2008  . CHICKENPOX, HX OF 01/02/2008    Tekeya Geffert W. 10/15/2014, 5:06 PM     Roseann Kees Bernie Covey, PT 10/15/2014 5:07 PM Phone: [phone] Fax: [phone]

## 2014-10-16 LAB — PMP ALCOHOL METABOLITE (ETG): ETGU: NEGATIVE ng/mL

## 2014-10-17 ENCOUNTER — Ambulatory Visit: Payer: Medicare Other

## 2014-10-17 DIAGNOSIS — R269 Unspecified abnormalities of gait and mobility: Secondary | ICD-10-CM

## 2014-10-17 DIAGNOSIS — M6281 Muscle weakness (generalized): Secondary | ICD-10-CM

## 2014-10-17 DIAGNOSIS — Z5189 Encounter for other specified aftercare: Secondary | ICD-10-CM | POA: Diagnosis not present

## 2014-10-17 LAB — OXYCODONE, URINE (LC/MS-MS)
NOROXYCODONE, UR: 265 ng/mL (ref <50)
Oxycodone, ur: 74 ng/mL (ref <50)
Oxymorphone: NEGATIVE ng/mL — AB (ref <50)

## 2014-10-17 NOTE — Therapy (Signed)
[facility] [address] [place], [place], [address] Phone: [phone]   Fax:  [phone]  Physical Therapy Treatment  Patient Details  Name: Audrey Brown MRN: [id number] Date of Birth: [birth year]  Encounter Date: 10/17/2014      PT End of Session - 10/17/14 1532    Visit Number 4   Number of Visits 9   Date for PT Re-Evaluation 12/02/14   Authorization Type Medicare-G code required every 10th visit   PT Start Time 1448   PT Stop Time 1530   PT Time Calculation (min) 42 min      Past Medical History  Diagnosis Date  . Headache(784.0)   . GERD (gastroesophageal reflux disease)   . corticobasilar degeneration     sees Dr. Tonye Royalty at [facility]  . Osteoarthritis     has seen Dr Beola Cord  . Bipolar depression     had seen Dr. Candis Schatz in past   . Dementia   . Gallstones   . Neuromuscular disorder   . Hiatal hernia 07/27/2012    EGD    Past Surgical History  Procedure Laterality Date  . Tonsillectomy    . Cesarean section    . Cervical disc surgery  1994    c-6   . Wrist surgery  06-24-2010    Dr Rush Farmer right wrist  . Neck surgery      Lumpectomy  . Foot neuroma surgery      There were no vitals taken for this visit.  Visit Diagnosis:  Abnormality of gait  Generalized muscle weakness      Subjective Assessment - 10/17/14 1454    Symptoms No falls or changes or complaints to report   Currently in Pain? No/denies       Standing tolerance: Trial 1 x 1 min 46 seconds with BUE support Trial 2 x 2 min 57 seconds with intermittent upper extremity support while putting objects away in overhead cabinets and taking objects out of cabinets.  Standing anterior/posterior limits of stability with BUE support and CGA  Standing with alternate LE foot taps across midline with single UE support and CGA 3 trials to fatigue.  Pt requires frequent therapeutic rest breaks.       PT Education -  10/17/14 1529    Education provided Yes   Education Details HEP additions see handout   Person(s) Educated Patient   Methods Explanation;Demonstration;Handout   Comprehension Verbalized understanding;Returned demonstration              Plan - 10/17/14 1533    Clinical Impression Statement Pt did not complain of dizziness today, and tolerated session well. She is making progress with standing tolerance. Continue per POC   PT Next Visit Plan Standing balance activities to progress toward goals                               Problem List Patient Active Problem List   Diagnosis Date Noted  . Chronic mid back pain 08/14/2014  . 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 Abnormality of gait 04/10/2014  . Lumbosacral spondylosis without myelopathy 04/10/2014  . Dysphagia, unspecified(787.20) 06/16/2011  . GERD with stricture 06/16/2011  . Bipolar 1 disorder, mixed 06/16/2011  . Parkinsonian syndrome 06/16/2011  . COMPRESSION FRACTURE, LUMBAR VERTEBRAE 11/26/2010  . PARKINSONISM 11/13/2010  . TINEA CORPORIS 07/31/2010  . CELLULITIS, LEGS 07/31/2010  . RESTING TREMOR 07/31/2010  . DEPENDENT EDEMA 06/25/2009  .  GERD 11/19/2008  . WEAKNESS 11/19/2008  . DEPRESSION 01/02/2008  . HEADACHE 01/02/2008  . CHICKENPOX, HX OF 01/02/2008    Delrae Sawyers D 10/17/2014, 4:40 PM

## 2014-10-17 NOTE — Patient Instructions (Signed)
Standing tolerance:  Practice standing at your countertop, as long as you can. Keep a chair nearby if you need to rest, but really push yourself to keep standing. Some ideas of activties to keep you busy while standing: writing down favorite family recipes while cooking, folding clothes, putting away dishes.   Weight Shift: Anterior / Posterior (Limits of Stability)   Hold onto counter top with a chair behind you in case you need to rest. Slowly shift weight backward until toes begin to rise off floor. Return to starting position. Shift weight slowly forward until heels begin to rise off floor. Hold each position 1-2 seconds. Perform as long as you can (up to 2 minutes at a time) Daily.   Copyright  V[employer]. All rights reserved.

## 2014-10-18 LAB — PRESCRIPTION MONITORING PROFILE (SOLSTAS)
Amphetamine/Meth: NEGATIVE ng/mL
BUPRENORPHINE, URINE: NEGATIVE ng/mL
Barbiturate Screen, Urine: NEGATIVE ng/mL
Benzodiazepine Screen, Urine: NEGATIVE ng/mL
CANNABINOID SCRN UR: NEGATIVE ng/mL
Carisoprodol, Urine: NEGATIVE ng/mL
Cocaine Metabolites: NEGATIVE ng/mL
Creatinine, Urine: 163.97 mg/dL (ref >20.0)
Fentanyl, Ur: NEGATIVE ng/mL
MDMA URINE: NEGATIVE ng/mL
Meperidine, Ur: NEGATIVE ng/mL
Methadone Screen, Urine: NEGATIVE ng/mL
Nitrites, Initial: NEGATIVE ug/mL
Opiate Screen, Urine: NEGATIVE ng/mL
Oxycodone Screen, Ur: NEGATIVE ng/mL
PROPOXYPHENE: NEGATIVE ng/mL
Tapentadol, urine: NEGATIVE ng/mL
Tramadol Scrn, Ur: NEGATIVE ng/mL
Zolpidem, Urine: NEGATIVE ng/mL
pH, Initial: 5.4 pH (ref 4.5–8.9)

## 2014-10-22 ENCOUNTER — Ambulatory Visit: Payer: Medicare Other

## 2014-10-22 DIAGNOSIS — M6281 Muscle weakness (generalized): Secondary | ICD-10-CM

## 2014-10-22 DIAGNOSIS — Z5189 Encounter for other specified aftercare: Secondary | ICD-10-CM | POA: Diagnosis not present

## 2014-10-22 DIAGNOSIS — R269 Unspecified abnormalities of gait and mobility: Secondary | ICD-10-CM

## 2014-10-22 NOTE — Therapy (Signed)
[facility] [address] [place], [place], [address] Phone: [phone]   Fax:  [phone]  Physical Therapy Treatment  Patient Details  Name: Audrey Brown MRN: [id number] Date of Birth: [birth year]  Encounter Date: 10/22/2014      PT End of Session - 10/22/14 1501    Visit Number 5   Number of Visits 9   Date for PT Re-Evaluation 12/02/14   Authorization Type Medicare-G code required every 10th visit   PT Start Time 1400   PT Stop Time 1445   PT Time Calculation (min) 45 min      Past Medical History  Diagnosis Date  . Headache(784.0)   . GERD (gastroesophageal reflux disease)   . corticobasilar degeneration     sees Dr. Tonye Royalty at [facility]  . Osteoarthritis     has seen Dr Beola Cord  . Bipolar depression     had seen Dr. Candis Schatz in past   . Dementia   . Gallstones   . Neuromuscular disorder   . Hiatal hernia 07/27/2012    EGD    Past Surgical History  Procedure Laterality Date  . Tonsillectomy    . Cesarean section    . Cervical disc surgery  1994    c-6   . Wrist surgery  06-24-2010    Dr Rush Farmer right wrist  . Neck surgery      Lumpectomy  . Foot neuroma surgery      There were no vitals taken for this visit.  Visit Diagnosis:  Abnormality of gait  Generalized muscle weakness      Subjective Assessment - 10/22/14 1405    Symptoms Standing is still bothering the back; hoping it improves as my muscles get stronger   Currently in Pain? Yes   Pain Score 6   "not too bad today"       Standing lateral  walking at counter with BUE support x1 min 45 seconds, again x2 min 5 seconds; with seated rest break between both trials emphasis on standing tolerance and hip abductor strengthening.  Nu Step for endurance training level 2 all extremities x8 minutes.  Gait training x50' with RW and supervision with pt requesting a rest break after 50', again x115' with encouragement to complete the  distance and verbal cues to increase step length.           PT Long Term Goals - 10/22/14 2151    PT LONG TERM GOAL #1   Title To be met by 10/31/14: Pt will perform HEP with family/caregiver supervision, to improve strength, balance, reduction of low back pain.   Time 4   Period Weeks   Status On-going   PT LONG TERM GOAL #2   Title To be met by 10/31/14:Pt will verbalize/demonstrate understanding of fall prevention techniques within home environment.   Time 4   Period Weeks   Status On-going   PT LONG TERM GOAL #3   Title To be met by 10/31/14:Pt will be able to stand at counter supported with UE support, for at least 5 minutes, for improved standing tolerance and improvement in ADLs.   Time 4   Period Weeks   Status On-going   PT LONG TERM GOAL #4   Title To be met by 10/31/14: Pt will be able to stand unsupported at least 1 minute with supervision,to demonstrate improved standing balance.   Time 4   Period Weeks   Status On-going   PT LONG TERM GOAL #5  Title To be met by 10/31/14:Pt will be able to improve TUG score to less than or equal to 45 seconds for decreased fall risk.   Time 4   Period Weeks   Status On-going          Plan - 10/22/14 2147    Clinical Impression Statement Pt's ambulation distance increased significantly today. She continues to be very debilitated but is making steady progress.   PT Next Visit Plan Core strengthening and Standing balance activities to progress toward goals                               Problem List Patient Active Problem List   Diagnosis Date Noted  . Chronic mid back pain 08/14/2014  . 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 Abnormality of gait 04/10/2014  . Lumbosacral spondylosis without myelopathy 04/10/2014  . Dysphagia, unspecified(787.20) 06/16/2011  . GERD with stricture 06/16/2011  . Bipolar 1 disorder, mixed 06/16/2011  . Parkinsonian syndrome 06/16/2011  . COMPRESSION  FRACTURE, LUMBAR VERTEBRAE 11/26/2010  . PARKINSONISM 11/13/2010  . TINEA CORPORIS 07/31/2010  . CELLULITIS, LEGS 07/31/2010  . RESTING TREMOR 07/31/2010  . DEPENDENT EDEMA 06/25/2009  . GERD 11/19/2008  . WEAKNESS 11/19/2008  . DEPRESSION 01/02/2008  . HEADACHE 01/02/2008  . CHICKENPOX, HX OF 01/02/2008    Delrae Sawyers D 10/22/2014, 9:54 PM

## 2014-10-24 NOTE — Progress Notes (Signed)
Urine drug screen for this encounter is consistent 

## 2014-10-25 ENCOUNTER — Ambulatory Visit: Payer: Medicare Other

## 2014-10-25 DIAGNOSIS — R269 Unspecified abnormalities of gait and mobility: Secondary | ICD-10-CM

## 2014-10-25 DIAGNOSIS — Z5189 Encounter for other specified aftercare: Secondary | ICD-10-CM | POA: Diagnosis not present

## 2014-10-25 DIAGNOSIS — M6281 Muscle weakness (generalized): Secondary | ICD-10-CM

## 2014-10-25 NOTE — Therapy (Signed)
[facility] [address] [place], [place], [address] Phone: [phone]   Fax:  [phone]  Physical Therapy Treatment  Patient Details  Name: Audrey Brown MRN: [id number] Date of Birth: July 25, 1946  Encounter Date: 10/25/2014      PT End of Session - 10/25/14 1452    Visit Number 6   Number of Visits 9   Date for PT Re-Evaluation 12/02/14   Authorization Type Medicare-G code required every 10th visit   PT Start Time 1402   PT Stop Time 1445   PT Time Calculation (min) 43 min      Past Medical History  Diagnosis Date  . Headache(784.0)   . GERD (gastroesophageal reflux disease)   . corticobasilar degeneration     sees Dr. Tonye Royalty at [facility]  . Osteoarthritis     has seen Dr Beola Cord  . Bipolar depression     had seen Dr. Candis Schatz in past   . Dementia   . Gallstones   . Neuromuscular disorder   . Hiatal hernia 07/27/2012    EGD    Past Surgical History  Procedure Laterality Date  . Tonsillectomy    . Cesarean section    . Cervical disc surgery  1994    c-6   . Wrist surgery  06-24-2010    Dr Rush Farmer right wrist  . Neck surgery      Lumpectomy  . Foot neuroma surgery      There were no vitals taken for this visit.  Visit Diagnosis:  Abnormality of gait  Generalized muscle weakness      Subjective Assessment - 10/25/14 1408    Symptoms Pt continues to reports fatiguing quite easily and prefers to have only one activtiy/outing planned per day. Discussed my recommendation for OT and pt agrees but wants this to happen on same day as all PT appointments.   Currently in Pain? Yes   Pain Score 7    Pain Location Back   Pain Orientation Lower       Instructed pt in how to utilize the brakes on her rollator.  Gait training x 115' with RW and supervision.   Standing tolerance in standing frame x5 minutes 12 seconds.  Ambulating with rollator while weaving through cone obstacles with  supervision  Ambulating while stepping over cone obstacles with CGA and verbal cues to utilize rollator brakes.          PT Long Term Goals - 10/25/14 1454    PT LONG TERM GOAL #1   Title To be met by 10/31/14: Pt will perform HEP with family/caregiver supervision, to improve strength, balance, reduction of low back pain.   Time 4   Period Weeks   Status On-going   PT LONG TERM GOAL #2   Title To be met by 10/31/14:Pt will verbalize/demonstrate understanding of fall prevention techniques within home environment.   Time 4   Period Weeks   Status On-going   PT LONG TERM GOAL #3   Title To be met by 10/31/14:Pt will be able to stand at counter supported with UE support, for at least 5 minutes, for improved standing tolerance and improvement in ADLs.   Time 4   Period Weeks   Status On-going   PT LONG TERM GOAL #4   Title To be met by 10/31/14: Pt will be able to stand unsupported at least 1 minute with supervision,to demonstrate improved standing balance.   Time 4   Period Weeks  Status On-going   PT LONG TERM GOAL #5   Title To be met by 10/31/14:Pt will be able to improve TUG score to less than or equal to 45 seconds for decreased fall risk.   Time 4   Period Weeks   Status On-going          Plan - 10/25/14 1453    Clinical Impression Statement Pt's standing tolerance in the standing frame increased significantly today. Progressing toward goals but may require a renewal to meet all goals.    PT Next Visit Plan Begin checking goals and discuss renewal vs discharge                               Problem List Patient Active Problem List   Diagnosis Date Noted  . Chronic mid back pain 08/14/2014  . 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 Abnormality of gait 04/10/2014  . Lumbosacral spondylosis without myelopathy 04/10/2014  . Dysphagia, unspecified(787.20) 06/16/2011  . GERD with stricture 06/16/2011  . Bipolar 1 disorder, mixed  06/16/2011  . Parkinsonian syndrome 06/16/2011  . COMPRESSION FRACTURE, LUMBAR VERTEBRAE 11/26/2010  . PARKINSONISM 11/13/2010  . TINEA CORPORIS 07/31/2010  . CELLULITIS, LEGS 07/31/2010  . RESTING TREMOR 07/31/2010  . DEPENDENT EDEMA 06/25/2009  . GERD 11/19/2008  . WEAKNESS 11/19/2008  . DEPRESSION 01/02/2008  . HEADACHE 01/02/2008  . CHICKENPOX, HX OF 01/02/2008    Delrae Sawyers D 10/25/2014, 3:03 PM

## 2014-10-26 ENCOUNTER — Telehealth: Payer: Self-pay

## 2014-10-26 NOTE — Telephone Encounter (Signed)
Rx request for lunesta 3 mg tablet  Pharm:  [facility]  Pls advise.

## 2014-10-28 ENCOUNTER — Other Ambulatory Visit: Payer: Self-pay | Admitting: Family Medicine

## 2014-10-29 ENCOUNTER — Ambulatory Visit: Payer: Medicare Other

## 2014-10-29 DIAGNOSIS — M6281 Muscle weakness (generalized): Secondary | ICD-10-CM

## 2014-10-29 DIAGNOSIS — Z5189 Encounter for other specified aftercare: Secondary | ICD-10-CM | POA: Diagnosis not present

## 2014-10-29 DIAGNOSIS — R269 Unspecified abnormalities of gait and mobility: Secondary | ICD-10-CM

## 2014-10-29 MED ORDER — ESZOPICLONE 3 MG PO TABS: 3.0000 mg | ORAL_TABLET | Freq: Every day | ORAL | Status: DC

## 2014-10-29 NOTE — Therapy (Addendum)
[facility] [address] [place], [place], [address] Phone: [phone]   Fax:  [phone]  Physical Therapy Treatment  Patient Details  Name: Audrey Brown MRN: [id number] Date of Birth: [birth year]  Encounter Date: 10/29/2014      PT End of Session - 10/29/14 1546    Visit Number 7   Number of Visits 9   Date for PT Re-Evaluation 12/02/14   Authorization Type Medicare-G code required every 10th visit   PT Start Time 1453   PT Stop Time 1531   PT Time Calculation (min) 38 min      Past Medical History  Diagnosis Date  . Headache(784.0)   . GERD (gastroesophageal reflux disease)   . corticobasilar degeneration     sees Dr. Tonye Royalty at [facility]  . Osteoarthritis     has seen Dr Beola Cord  . Bipolar depression     had seen Dr. Candis Schatz in past   . Dementia   . Gallstones   . Neuromuscular disorder   . Hiatal hernia 07/27/2012    EGD    Past Surgical History  Procedure Laterality Date  . Tonsillectomy    . Cesarean section    . Cervical disc surgery  1994    c-6   . Wrist surgery  06-24-2010    Dr Rush Farmer right wrist  . Neck surgery      Lumpectomy  . Foot neuroma surgery      There were no vitals taken for this visit.  Visit Diagnosis:  Abnormality of gait  Generalized muscle weakness      Subjective Assessment - 10/29/14 1455    Symptoms Pt reports feeling extra tired today after having 4 appointments in one day.   Currently in Pain? Yes   Pain Score 8    Pain Location Back   Aggravating Factors  standing      Checked all goals. See goals section for details.  Pt demonstrated independence with all HEP exercises performed as indicated on handout previously provided. These were checked today. Only exercise remaining to be checked is PWR sitting transition exercise.  Pt stood unsupported initially for 38 seconds then reached for UE support although it wasn't necessary. Then,  on second attempt was able to stand 1 minute 2 seconds without UE support.   TUG 51.75 seconds today with RW.                      PT Education - 10/29/14 1520    Education provided Yes   Education Details fall prevention ed   [employer]) Educated Patient   Methods Explanation;Handout   Comprehension Verbalized understanding             PT Long Term Goals - 10/29/14 1456    PT LONG TERM GOAL #1   Title To be met by 10/31/14: Pt will perform HEP with family/caregiver supervision, to improve strength, balance, reduction of low back pain.   Time 4   Period Weeks   Status On-going  Independent with all except need to check PWR transition   PT LONG TERM GOAL #2   Title To be met by 10/31/14:Pt will verbalize/demonstrate understanding of fall prevention techniques within home environment.   Time --   Period --   Status Achieved   PT LONG TERM GOAL #3   Title To be met by 10/31/14:Pt will be able to stand at counter supported with UE support, for at least  5 minutes, for improved standing tolerance and improvement in ADLs.   Status Achieved  Stood 5 minutes 7 seconds on 10/29/14   PT LONG TERM GOAL #4   Title To be met by 10/31/14: Pt will be able to stand unsupported at least 1 minute with supervision,to demonstrate improved standing balance.   Time --   Period --   Status Achieved   PT LONG TERM GOAL #5   Title To be met by 10/31/14:Pt will be able to improve TUG score to less than or equal to 45 seconds for decreased fall risk.   Time 4   Period Weeks   Status On-going               Plan - 10/29/14 1518    Clinical Impression Statement Pt has improved standing tolerance but does not seem to be utilizing this tolerance in her home environmnt. Will plan to d/c next visit.   PT Next Visit Plan Finish checking goals (all HEP is independent except need to check PWR sitting transition exercise) and d/c (determine if FOTO needs to be checked)         Problem List Patient Active Problem List   Diagnosis Date Noted  . Chronic mid back pain 08/14/2014  . 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 Abnormality of gait 04/10/2014  . Lumbosacral spondylosis without myelopathy 04/10/2014  . Dysphagia, unspecified(787.20) 06/16/2011  . GERD with stricture 06/16/2011  . Bipolar 1 disorder, mixed 06/16/2011  . Parkinsonian syndrome 06/16/2011  . COMPRESSION FRACTURE, LUMBAR VERTEBRAE 11/26/2010  . PARKINSONISM 11/13/2010  . TINEA CORPORIS 07/31/2010  . CELLULITIS, LEGS 07/31/2010  . RESTING TREMOR 07/31/2010  . DEPENDENT EDEMA 06/25/2009  . GERD 11/19/2008  . WEAKNESS 11/19/2008  . DEPRESSION 01/02/2008  . HEADACHE 01/02/2008  . CHICKENPOX, HX OF 01/02/2008    Delrae Sawyers D 10/29/2014, 4:38 PM  [facility] [address] [place], [place], [address] Phone: [phone]   Fax:  [phone]

## 2014-10-29 NOTE — Patient Instructions (Signed)

## 2014-10-29 NOTE — Telephone Encounter (Signed)
I faxed script 

## 2014-10-29 NOTE — Telephone Encounter (Signed)
Ready to be faxed.

## 2014-10-31 ENCOUNTER — Encounter: Payer: Self-pay | Admitting: Physical Therapy

## 2014-10-31 ENCOUNTER — Ambulatory Visit: Payer: Medicare Other | Admitting: Physical Therapy

## 2014-10-31 DIAGNOSIS — M6281 Muscle weakness (generalized): Secondary | ICD-10-CM

## 2014-10-31 DIAGNOSIS — R269 Unspecified abnormalities of gait and mobility: Secondary | ICD-10-CM

## 2014-10-31 DIAGNOSIS — Z5189 Encounter for other specified aftercare: Secondary | ICD-10-CM | POA: Diagnosis not present

## 2014-10-31 NOTE — Therapy (Signed)
[facility] [address] [place], [place], [address] Phone: [phone]   Fax:  [phone]  Physical Therapy Treatment  Patient Details  Name: Audrey Brown MRN: [id number] Date of Birth: May 28, 1946  Encounter Date: 10/31/2014      PT End of Session - 10/31/14 1451    Visit Number 8   PT Start Time 5993   PT Stop Time 1444   PT Time Calculation (min) 39 min   Equipment Utilized During Treatment Gait belt   Activity Tolerance Patient tolerated treatment well      Past Medical History  Diagnosis Date  . Headache(784.0)   . GERD (gastroesophageal reflux disease)   . corticobasilar degeneration     sees Dr. Tonye Royalty at [facility]  . Osteoarthritis     has seen Dr Beola Cord  . Bipolar depression     had seen Dr. Candis Schatz in past   . Dementia   . Gallstones   . Neuromuscular disorder   . Hiatal hernia 07/27/2012    EGD    Past Surgical History  Procedure Laterality Date  . Tonsillectomy    . Cesarean section    . Cervical disc surgery  1994    c-6   . Wrist surgery  06-24-2010    Dr Rush Farmer right wrist  . Neck surgery      Lumpectomy  . Foot neuroma surgery      There were no vitals taken for this visit.  Visit Diagnosis:  Abnormality of gait  Generalized muscle weakness      Subjective Assessment - 10/31/14 1407    Symptoms Doing exercises faithfully.  Still have a lot of back pain, especially with standing.  Feels that the busyness of the last few weeks has significantly tired her out.   Currently in Pain? Yes   Pain Score 7    Pain Location Back   Pain Orientation Mid;Lower   Pain Descriptors / Indicators Aching;Dull   Pain Type Chronic pain   Pain Onset More than a month ago   Aggravating Factors  Standing   Pain Relieving Factors Lying down, pillows under knees and Ibuprofen alleviates pain.  Doesn't feel that exercises are helping.     OBJECTIVE: Neuro ReEducation:  Timed  Up and Go score:  51.60 seconds using RW, improved from 53.85 seconds at eval.  Pt performs 5 reps of seated PWR! Moves transition step in armless chair, with cues for upright posture and increased step length, to improve ability to get in and out of car with improved ease.  Pt appears to be performing exercises at home.  Self Care:  Discussed progress towards goals, plans for D/C this visit.  Discussed importance of continued performance of HEP, even when PT discharges.  Pt completes FOTO, with Functional Intake score 28, improved from 23.  Activities of Balance Confidence Scale:  0% confidence (decreased from 5% at eval).  Discussed safety concerns and recommended pt have close supervision at all times with standing.  Discussed limitations of mobility due to ongoing pain.  Fall prevention education provided.  Discussed the above with pt and granddaughter with verbal and handout information.  Pt verbalizes understanding.        PT Long Term Goals - 10/31/14 1411    PT LONG TERM GOAL #1   Title To be met by 10/31/14: Pt will perform HEP with family/caregiver supervision, to improve strength, balance, reduction of low back pain.   Status Achieved  PT LONG TERM GOAL #2   Title To be met by 09-Nov-2014:Pt will verbalize/demonstrate understanding of fall prevention techniques within home environment.   Status Achieved  10/29/14   PT LONG TERM GOAL #3   Title To be met by 11/09/14:Pt will be able to stand at counter supported with UE support, for at least 5 minutes, for improved standing tolerance and improvement in ADLs.   Status Achieved  10/29/14   PT LONG TERM GOAL #4   Title To be met by 11/09/14: Pt will be able to stand unsupported at least 1 minute with supervision,to demonstrate improved standing balance.   Status Achieved  10/29/14   PT LONG TERM GOAL #5   Title To be met by 11-09-14:Pt will be able to improve TUG score to less than or equal to 45 seconds for decreased fall risk.    Status Not Met  TUG 51.60 sec               Plan - 11-09-2014 1452    Clinical Impression Statement Pt has met LTG #1, #2, #3, #4.  LTG #5 not met, with TUG score >51 seconds.  Discussed pt safety with balance and gait activities, recommending pt have supervision whenever she is up and walking due to safety concerns.  Pt is appropriate for D/Cf rom PT at this time.   PT Next Visit Plan Pt is discharged as of this visit.   Consulted and Agree with Plan of Care Patient;Family member/caregiver  Granddaughter present          G-Codes - November 09, 2014 1456    Functional Assessment Tool Used TUG 51.6 seconds; standing 5 minutes supported, standing 1 minute unsupported   Mobility: Walking and Moving Around Current Status [phone]) At least 60 percent but less than 80 percent impaired, limited or restricted   Mobility: Walking and Moving Around Goal Status [phone]) At least 60 percent but less than 80 percent impaired, limited or restricted      Problem List Patient Active Problem List   Diagnosis Date Noted  . Chronic mid back pain 08/14/2014  . 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 Abnormality of gait 04/10/2014  . Lumbosacral spondylosis without myelopathy 04/10/2014  . Dysphagia, unspecified(787.20) 06/16/2011  . GERD with stricture 06/16/2011  . Bipolar 1 disorder, mixed 06/16/2011  . Parkinsonian syndrome 06/16/2011  . COMPRESSION FRACTURE, LUMBAR VERTEBRAE 11/26/2010  . PARKINSONISM 11/13/2010  . TINEA CORPORIS 07/31/2010  . CELLULITIS, LEGS 07/31/2010  . RESTING TREMOR 07/31/2010  . DEPENDENT EDEMA 06/25/2009  . GERD 11/19/2008  . WEAKNESS 11/19/2008  . DEPRESSION 01/02/2008  . HEADACHE 01/02/2008  . CHICKENPOX, HX OF 01/02/2008  PHYSICAL THERAPY DISCHARGE SUMMARY  Visits from Start of Care: 8  Current functional level related to goals / functional outcomes: See goals assessed above.  Patient's functional progress limited due to pain and fatigue.    Remaining deficits: Pain, fatigue, decreased balance, gait, decreased strength    Education / Equipment: Pt has been educated in fall prevention, HEP. Plan: Patient agrees to discharge.  Patient goals were partially met. Patient is being discharged due to lack of progress.  ?????Pt is pleased with therapy and understands therapy was limited due to ongoing pain with increased activities.      Frazier Butt 2014/11/09, 3:05 PM  [facility] [address] [place], [place], [address] Phone: [phone]   Fax:  [phone]

## 2014-10-31 NOTE — Patient Instructions (Signed)

## 2014-12-17 ENCOUNTER — Encounter: Payer: Self-pay | Admitting: Physical Medicine & Rehabilitation

## 2014-12-17 ENCOUNTER — Other Ambulatory Visit: Payer: Self-pay | Admitting: Physical Medicine & Rehabilitation

## 2014-12-17 ENCOUNTER — Encounter: Payer: Medicare Other | Attending: Physical Medicine & Rehabilitation

## 2014-12-17 ENCOUNTER — Ambulatory Visit (HOSPITAL_BASED_OUTPATIENT_CLINIC_OR_DEPARTMENT_OTHER): Payer: Medicare Other | Admitting: Physical Medicine & Rehabilitation

## 2014-12-17 VITALS — BP 143/45 | HR 68 | Resp 14

## 2014-12-17 DIAGNOSIS — M5489 Other dorsalgia: Secondary | ICD-10-CM | POA: Insufficient documentation

## 2014-12-17 DIAGNOSIS — Z79899 Other long term (current) drug therapy: Secondary | ICD-10-CM

## 2014-12-17 DIAGNOSIS — Z5181 Encounter for therapeutic drug level monitoring: Secondary | ICD-10-CM | POA: Diagnosis not present

## 2014-12-17 DIAGNOSIS — G894 Chronic pain syndrome: Secondary | ICD-10-CM

## 2014-12-17 DIAGNOSIS — G3185 Corticobasal degeneration: Secondary | ICD-10-CM | POA: Diagnosis not present

## 2014-12-17 DIAGNOSIS — R269 Unspecified abnormalities of gait and mobility: Secondary | ICD-10-CM | POA: Insufficient documentation

## 2014-12-17 DIAGNOSIS — F0280 Dementia in other diseases classified elsewhere without behavioral disturbance: Secondary | ICD-10-CM

## 2014-12-17 DIAGNOSIS — G20 Parkinson's disease: Secondary | ICD-10-CM | POA: Insufficient documentation

## 2014-12-17 DIAGNOSIS — G8929 Other chronic pain: Secondary | ICD-10-CM | POA: Diagnosis not present

## 2014-12-17 DIAGNOSIS — M47817 Spondylosis without myelopathy or radiculopathy, lumbosacral region: Secondary | ICD-10-CM | POA: Diagnosis not present

## 2014-12-17 DIAGNOSIS — M546 Pain in thoracic spine: Secondary | ICD-10-CM

## 2014-12-17 MED ORDER — OXYCODONE-ACETAMINOPHEN 5-325 MG PO TABS: 1.0000 | ORAL_TABLET | Freq: Every day | ORAL | Status: DC | PRN

## 2014-12-17 NOTE — Progress Notes (Signed)
Subjective:    Patient ID: Audrey Brown, female    DOB: Mar 22, 1946, 69 y.o.   MRN: [id number]  HPI Patient returns today with her daughter, complaints include mid back pain. Patient is wheelchair bound for most the day. She has 2 wheelchairs one that she uses more often than the old one that sits in the garage. We discussed other treatment options and J back cushion was recommended. Did discuss that this would let make the wheelchair less mobile as it will not fold. Daughter may attach J back cushion to the home wheelchair and use another wheelchair for transport to doctor's offices etc.    Pain Inventory Average Pain 8 Pain Right Now 7 My pain is constant and dull  In the last 24 hours, has pain interfered with the following? General activity 10 Relation with others 7 Enjoyment of life 10 What TIME of day is your pain at its worst? daytime Sleep (in general) Poor  Pain is worse with: walking, bending, sitting, inactivity and standing Pain improves with: rest, heat/ice and medication Relief from Meds: 10  Mobility how many minutes can you walk? 2-3 use a wheelchair  Function retired I need assistance with the following:  dressing, bathing, meal prep, household duties and shopping  Neuro/Psych tremor confusion depression loss of taste or smell  Prior Studies Any changes since last visit?  no  Physicians involved in your care Any changes since last visit?  no   Family History  Problem Relation Age of Onset  . Emphysema Mother   . Heart attack Father   . Hypertension    . Lung cancer     History   Social History  . Marital Status: Widowed    Spouse Name: N/A    Number of Children: 4  . Years of Education: N/A   Occupational History  . Retired    Social History Main Topics  . Smoking status: Never Smoker   . Smokeless tobacco: Never Used  . Alcohol Use: No  . Drug Use: No  . Sexual Activity: Not on file   Other Topics Concern  . Not on file    Social History Narrative   Daughter is her POA and must come to all visits in GI and [place]    Past Surgical History  Procedure Laterality Date  . Tonsillectomy    . Cesarean section    . Cervical disc surgery  1994    c-6   . Wrist surgery  06-24-2010    Dr Rush Farmer right wrist  . Neck surgery      Lumpectomy  . Foot neuroma surgery     Past Medical History  Diagnosis Date  . Headache(784.0)   . GERD (gastroesophageal reflux disease)   . corticobasilar degeneration     sees Dr. Tonye Royalty at [facility]  . Osteoarthritis     has seen Dr Beola Cord  . Bipolar depression     had seen Dr. Candis Schatz in past   . Dementia   . Gallstones   . Neuromuscular disorder   . Hiatal hernia 07/27/2012    EGD   There were no vitals taken for this visit.  Opioid Risk Score:   Fall Risk Score:    Review of Systems  Constitutional:       Loss of taste  Gastrointestinal: Positive for nausea and vomiting.  Neurological: Positive for tremors.  Psychiatric/Behavioral: Positive for confusion and dysphoric mood.  All other systems reviewed and are negative.  Objective:   Physical Exam Gen. No acute distress Mood and affect flat, inappropriate, repeats herself states she doesn't remember anything  Motor strength is 4/5 bilateral deltoids, biceps, triceps, grip, hip flexor, knee extensor ankle dorsi flex and plantar flexion Some element of apraxia or manual muscle testing needs just show cues. Evidence of tremor bilateral upper extremities       Assessment & Plan:  1. Lumbar spondylosis improvement after radiofrequency procedure, may repeat right side in 1 months if needed  Do not think RF has worn off, Right side should wear off first  2. Chronic mid back pain has some mild degenerative changes on thoracic x-ray also may have some muscular pain related to prolonged immobility in seated position.  Have written for J back cushion which she can get at a medical supply store                                         3. Parkinson's disease with dementia with frequent falls Completed physical therapy, cognitive deficits limit carryover

## 2014-12-17 NOTE — Patient Instructions (Signed)
Recommend chair with Ulice Dash seat back cushion

## 2014-12-20 LAB — PRESCRIPTION MONITORING PROFILE (SOLSTAS)
AMPHETAMINE/METH: NEGATIVE ng/mL
BENZODIAZEPINE SCREEN, URINE: NEGATIVE ng/mL
Barbiturate Screen, Urine: NEGATIVE ng/mL
Buprenorphine, Urine: NEGATIVE ng/mL
CANNABINOID SCRN UR: NEGATIVE ng/mL
CARISOPRODOL, URINE: NEGATIVE ng/mL
COCAINE METABOLITES: NEGATIVE ng/mL
Creatinine, Urine: 183.8 mg/dL (ref >20.0)
Fentanyl, Ur: NEGATIVE ng/mL
MDMA URINE: NEGATIVE ng/mL
MEPERIDINE UR: NEGATIVE ng/mL
Methadone Screen, Urine: NEGATIVE ng/mL
Nitrites, Initial: NEGATIVE ug/mL
Opiate Screen, Urine: NEGATIVE ng/mL
Oxycodone Screen, Ur: NEGATIVE ng/mL
Propoxyphene: NEGATIVE ng/mL
Tapentadol, urine: NEGATIVE ng/mL
Tramadol Scrn, Ur: NEGATIVE ng/mL
ZOLPIDEM, URINE: NEGATIVE ng/mL
pH, Initial: 5.9 pH (ref 4.5–8.9)

## 2014-12-20 LAB — PMP ALCOHOL METABOLITE (ETG): Ethyl Glucuronide (EtG): NEGATIVE ng/mL

## 2014-12-28 NOTE — Progress Notes (Signed)
Urine drug screen for this encounter is consistent for prescribed medication last being taken 12/14/14 and test date 12/17/14

## 2015-01-01 ENCOUNTER — Other Ambulatory Visit: Payer: Self-pay | Admitting: Gastroenterology

## 2015-02-22 ENCOUNTER — Other Ambulatory Visit: Payer: Self-pay | Admitting: Obstetrics and Gynecology

## 2015-02-22 DIAGNOSIS — Z1231 Encounter for screening mammogram for malignant neoplasm of breast: Secondary | ICD-10-CM

## 2015-02-25 ENCOUNTER — Telehealth: Payer: Self-pay | Admitting: *Deleted

## 2015-02-25 ENCOUNTER — Telehealth: Payer: Self-pay | Admitting: Internal Medicine

## 2015-02-25 MED ORDER — AMBULATORY NON FORMULARY MEDICATION: Status: DC

## 2015-02-25 NOTE — Telephone Encounter (Signed)
Patient requests refill of domperidone. She is overdue for office visit. I have scheduled her for Dr Vena Rua next available appointment which is 04/23/15. Patient verbalizes understanding and I have sent a prescription to [facility] for domperidone.

## 2015-02-25 NOTE — Telephone Encounter (Signed)
Pt states she has been having problems with a bad cough that leads to nausea and dry heaves. Discussed with pt that she should contact her PCP regarding the cough to have this evaluated. Pt verbalized understanding.

## 2015-02-28 ENCOUNTER — Ambulatory Visit (INDEPENDENT_AMBULATORY_CARE_PROVIDER_SITE_OTHER): Payer: Medicare Other | Admitting: Family Medicine

## 2015-02-28 ENCOUNTER — Encounter: Payer: Self-pay | Admitting: Family Medicine

## 2015-02-28 VITALS — BP 138/86 | HR 76 | Temp 98.9°F

## 2015-02-28 DIAGNOSIS — J209 Acute bronchitis, unspecified: Secondary | ICD-10-CM | POA: Diagnosis not present

## 2015-02-28 MED ORDER — HYDROCODONE-HOMATROPINE 5-1.5 MG/5ML PO SYRP: 5.0000 mL | ORAL_SOLUTION | ORAL | Status: DC | PRN

## 2015-02-28 MED ORDER — METHYLPREDNISOLONE ACETATE 80 MG/ML IJ SUSP
120.0000 mg | Freq: Once | INTRAMUSCULAR | Status: AC
  Administered 2015-02-28: 120 mg via INTRAMUSCULAR

## 2015-02-28 MED ORDER — AZITHROMYCIN 250 MG PO TABS: ORAL_TABLET | ORAL | Status: DC

## 2015-02-28 NOTE — Progress Notes (Signed)
Pre visit review using our clinic review tool, if applicable. No additional management support is needed unless otherwise documented below in the visit note. Pt unable to stand to get a weight

## 2015-02-28 NOTE — Progress Notes (Signed)
   Subjective:    Patient ID: Audrey Brown, female    DOB: January 25, 1946, 69 y.o.   MRN: [id number]  HPI Here for one week of chest tightness and a dry hard cough.    Review of Systems  Constitutional: Negative.   HENT: Positive for congestion and postnasal drip. Negative for sinus pressure.   Eyes: Negative.   Respiratory: Positive for cough, chest tightness and wheezing.        Objective:   Physical Exam  Constitutional: She appears well-developed and well-nourished.  HENT:  Right Ear: External ear normal.  Left Ear: External ear normal.  Nose: Nose normal.  Mouth/Throat: Oropharynx is clear and moist.  Eyes: Conjunctivae are normal.  Cardiovascular: Normal rate, regular rhythm, normal heart sounds and intact distal pulses.   Pulmonary/Chest: Effort normal. She has no rales.  Scattered rhonchi and wheezes   Lymphadenopathy:    She has no cervical adenopathy.          Assessment & Plan:  Treat with a Zpack, add Mucinex prn

## 2015-03-14 ENCOUNTER — Ambulatory Visit
Admission: RE | Admit: 2015-03-14 | Discharge: 2015-03-14 | Disposition: A | Discharge status: Home / Self Care | Payer: Medicare Other | Source: Ambulatory Visit | Attending: Obstetrics and Gynecology | Admitting: Obstetrics and Gynecology

## 2015-03-14 DIAGNOSIS — Z1231 Encounter for screening mammogram for malignant neoplasm of breast: Secondary | ICD-10-CM | POA: Diagnosis not present

## 2015-03-15 ENCOUNTER — Other Ambulatory Visit: Payer: Self-pay | Admitting: Obstetrics and Gynecology

## 2015-03-15 DIAGNOSIS — R928 Other abnormal and inconclusive findings on diagnostic imaging of breast: Secondary | ICD-10-CM

## 2015-03-18 ENCOUNTER — Ambulatory Visit: Payer: Medicare Other | Admitting: Physical Medicine & Rehabilitation

## 2015-03-19 ENCOUNTER — Encounter: Payer: Self-pay | Admitting: Physical Medicine & Rehabilitation

## 2015-03-19 ENCOUNTER — Encounter: Payer: Medicare Other | Attending: Physical Medicine & Rehabilitation

## 2015-03-19 ENCOUNTER — Ambulatory Visit (HOSPITAL_BASED_OUTPATIENT_CLINIC_OR_DEPARTMENT_OTHER): Payer: Medicare Other | Admitting: Physical Medicine & Rehabilitation

## 2015-03-19 VITALS — BP 132/77 | HR 71 | Resp 16

## 2015-03-19 DIAGNOSIS — M7918 Myalgia, other site: Secondary | ICD-10-CM

## 2015-03-19 DIAGNOSIS — G20 Parkinson's disease: Secondary | ICD-10-CM | POA: Diagnosis not present

## 2015-03-19 DIAGNOSIS — M5489 Other dorsalgia: Secondary | ICD-10-CM | POA: Diagnosis not present

## 2015-03-19 DIAGNOSIS — M47817 Spondylosis without myelopathy or radiculopathy, lumbosacral region: Secondary | ICD-10-CM | POA: Diagnosis not present

## 2015-03-19 DIAGNOSIS — R269 Unspecified abnormalities of gait and mobility: Secondary | ICD-10-CM | POA: Insufficient documentation

## 2015-03-19 DIAGNOSIS — G8929 Other chronic pain: Secondary | ICD-10-CM | POA: Diagnosis not present

## 2015-03-19 DIAGNOSIS — M791 Myalgia: Secondary | ICD-10-CM | POA: Diagnosis not present

## 2015-03-19 MED ORDER — OXYCODONE-ACETAMINOPHEN 5-325 MG PO TABS: 1.0000 | ORAL_TABLET | Freq: Every day | ORAL | Status: DC | PRN

## 2015-03-19 NOTE — Progress Notes (Signed)
Subjective:    Patient ID: Audrey Brown, female    DOB: 12/20/1945, 69 y.o.   MRN: [id number]  HPI  Will need CSA if we prescribe today.  Last UDS was consistent, but got hydrocodone syrup from Dr Sharlene Motts in April--completed use. Patient accompanied by her daughter. This a 69 year old female with Parkinson's disease as well as dementia. She has chronic pain related to lumbar spondylosis. Overall she's had good results with lumbar radiofrequency procedure. She does have some upper back pain which has been previously evaluated, it appears to be more postural and muscular in nature related later to her prolonged wheelchair positioning Pain Inventory Average Pain 7 Pain Right Now 7 My pain is dull  In the last 24 hours, has pain interfered with the following? General activity 10 Relation with others 10 Enjoyment of life 10 What TIME of day is your pain at its worst? daytime and evening Sleep (in general) Poor  Pain is worse with: walking, bending, sitting, inactivity, standing and some activites Pain improves with: rest, heat/ice and medication Relief from Meds: no answer  Mobility walk with assistance how many minutes can you walk? 2-3 ability to climb steps?  no do you drive?  no use a wheelchair transfers alone  Function retired I need assistance with the following:  dressing, bathing, meal prep, household duties and shopping  Neuro/Psych tremor trouble walking confusion depression loss of taste or smell  Prior Studies Any changes since last visit?  no  Physicians involved in your care Any changes since last visit?  no   Family History  Problem Relation Age of Onset  . Emphysema Mother   . Heart attack Father   . Hypertension    . Lung cancer     History   Social History  . Marital Status: Widowed    Spouse Name: N/A  . Number of Children: 4  . Years of Education: N/A   Occupational History  . Retired    Social History Main Topics  . Smoking  status: Never Smoker   . Smokeless tobacco: Never Used  . Alcohol Use: No  . Drug Use: No  . Sexual Activity: Not on file   Other Topics Concern  . None   Social History Narrative   Daughter is her POA and must come to all visits in GI and [place]    Past Surgical History  Procedure Laterality Date  . Tonsillectomy    . Cesarean section    . Cervical disc surgery  1994    c-6   . Wrist surgery  06-24-2010    Dr Rush Farmer right wrist  . Neck surgery      Lumpectomy  . Foot neuroma surgery     Past Medical History  Diagnosis Date  . Headache(784.0)   . GERD (gastroesophageal reflux disease)   . corticobasilar degeneration     sees Dr. Tonye Royalty at [facility]  . Osteoarthritis     has seen Dr Beola Cord  . Bipolar depression     had seen Dr. Candis Schatz in past   . Dementia   . Gallstones   . Neuromuscular disorder   . Hiatal hernia 07/27/2012    EGD   BP 132/77 mmHg  Pulse 71  Resp 16  SpO2 97%  Opioid Risk Score:   Fall Risk Score: Moderate Fall Risk (6-13 points) (educated and given handout on fall prevention in the home)`1  Depression screen PHQ 2/9  Depression screen [facility] 2/9 03/19/2015 02/28/2015  Decreased Interest 0 0  Down, Depressed, Hopeless 0 0  PHQ - 2 Score 0 0  Altered sleeping 0 -  Tired, decreased energy 0 -  Change in appetite 0 -  Feeling bad or failure about yourself  0 -  Trouble concentrating 0 -  Moving slowly or fidgety/restless 0 -  Suicidal thoughts 0 -  PHQ-9 Score 0 -     Review of Systems  Respiratory:       Had URI  Gastrointestinal: Positive for nausea and vomiting.  Musculoskeletal: Positive for gait problem.  Neurological: Positive for tremors.  Psychiatric/Behavioral: Positive for confusion and dysphoric mood.  All other systems reviewed and are negative.      Objective:   Physical Exam  Elderly female in no acute distress she is appears well groomed. She has mild upper extremity tremor She has masked facies Mild  tenderness palpation along the thoracic paraspinals around the upper trapezius as well as P scapular area Motor strength is 4 minus bilateral deltoid bicep tricep grip hip flexor and extensor ankle dorsiflexor Her balance is very poor so we did not test her ambulation      Assessment & Plan:  1. Lumbar spondylosis10 months status post right L3-L4 medial branch L5 dorsal ramus radiofrequency, 8 months status post left side at corresponding levels. I would expect the right side to wear off first. No signs of this thus far can repeat 1 symptoms returned.  She is on very intermittent dose of oxycodone 20 tablets last around 3 months. We'll have her sign a controlled substance agreement. went over risks and benefits of opioids however given her very small doses do not expect major side effects.  2. Cervicothoracic myofascial pain syndrome, she has gone through physical therapy for this but has not kept up with her home exercise program. I encouraged her and her daughter to do this on a daily basis

## 2015-03-19 NOTE — Patient Instructions (Signed)
Will need to see you every 3 months to monitor

## 2015-03-21 ENCOUNTER — Ambulatory Visit
Admission: RE | Admit: 2015-03-21 | Discharge: 2015-03-21 | Disposition: A | Discharge status: Home / Self Care | Payer: Medicare Other | Source: Ambulatory Visit | Attending: Obstetrics and Gynecology | Admitting: Obstetrics and Gynecology

## 2015-03-21 DIAGNOSIS — R928 Other abnormal and inconclusive findings on diagnostic imaging of breast: Secondary | ICD-10-CM

## 2015-03-21 DIAGNOSIS — N63 Unspecified lump in breast: Secondary | ICD-10-CM | POA: Diagnosis not present

## 2015-03-28 ENCOUNTER — Encounter: Payer: Self-pay | Admitting: *Deleted

## 2015-04-08 ENCOUNTER — Other Ambulatory Visit: Payer: Self-pay | Admitting: Family Medicine

## 2015-04-11 ENCOUNTER — Telehealth: Payer: Self-pay | Admitting: Family Medicine

## 2015-04-11 NOTE — Telephone Encounter (Signed)
Pt has mammogram done in May 2016 and chart was updated.

## 2015-04-23 ENCOUNTER — Ambulatory Visit (INDEPENDENT_AMBULATORY_CARE_PROVIDER_SITE_OTHER): Payer: Medicare Other | Admitting: Internal Medicine

## 2015-04-23 ENCOUNTER — Encounter: Payer: Self-pay | Admitting: Internal Medicine

## 2015-04-23 VITALS — BP 122/80 | HR 60 | Resp 16

## 2015-04-23 DIAGNOSIS — K3184 Gastroparesis: Secondary | ICD-10-CM

## 2015-04-23 DIAGNOSIS — K219 Gastro-esophageal reflux disease without esophagitis: Secondary | ICD-10-CM | POA: Diagnosis not present

## 2015-04-23 DIAGNOSIS — R1310 Dysphagia, unspecified: Secondary | ICD-10-CM | POA: Diagnosis not present

## 2015-04-23 DIAGNOSIS — R140 Abdominal distension (gaseous): Secondary | ICD-10-CM

## 2015-04-23 DIAGNOSIS — K589 Irritable bowel syndrome without diarrhea: Secondary | ICD-10-CM | POA: Diagnosis not present

## 2015-04-23 DIAGNOSIS — R109 Unspecified abdominal pain: Secondary | ICD-10-CM

## 2015-04-23 MED ORDER — AMBULATORY NON FORMULARY MEDICATION: Status: DC

## 2015-04-23 MED ORDER — ESOMEPRAZOLE MAGNESIUM 40 MG PO CPDR: 40.0000 mg | DELAYED_RELEASE_CAPSULE | Freq: Every day | ORAL | Status: DC

## 2015-04-23 MED ORDER — HYOSCYAMINE SULFATE 0.125 MG SL SUBL: 0.1250 mg | SUBLINGUAL_TABLET | Freq: Four times a day (QID) | SUBLINGUAL | Status: DC | PRN

## 2015-04-23 NOTE — Patient Instructions (Addendum)
We have sent the following medications to your mail order pharmacy for your convenience: Levsin and Nexium  Printed prescription has been given to you to take to your local pharmacy   You have been scheduled for a Barium Esophogram at [facility] (1st floor of the hospital) on 05-02-15 at 9:30. Please arrive 15 minutes prior to your appointment for registration. Make certain not to have anything to eat or drink 6 hours prior to your test. If you need to reschedule for any reason, please contact radiology at [phone] to do so. __________________________________________________________________ A barium swallow is an examination that concentrates on views of the esophagus. This tends to be a double contrast exam (barium and two liquids which, when combined, create a gas to distend the wall of the oesophagus) or single contrast (non-ionic iodine based). The study is usually tailored to your symptoms so a good history is essential. Attention is paid during the study to the form, structure and configuration of the esophagus, looking for functional disorders (such as aspiration, dysphagia, achalasia, motility and reflux) EXAMINATION You may be asked to change into a gown, depending on the type of swallow being performed. A radiologist and radiographer will perform the procedure. The radiologist will advise you of the type of contrast selected for your procedure and direct you during the exam. You will be asked to stand, sit or lie in several different positions and to hold a small amount of fluid in your mouth before being asked to swallow while the imaging is performed .In some instances you may be asked to swallow barium coated marshmallows to assess the motility of a solid food bolus. The exam can be recorded as a digital or video fluoroscopy procedure. POST PROCEDURE It will take 1-2 days for the barium to pass through your system. To facilitate this, it is important, unless otherwise  directed, to increase your fluids for the next 24-48hrs and to resume your normal diet.  This test typically takes about 30 minutes to perform. __________________________________________________________________________________  Please call our office back to make a follow up appt in 9 months

## 2015-04-24 NOTE — Progress Notes (Signed)
   Subjective:    Patient ID: Audrey Brown, female    DOB: Jun 23, 1946, 69 y.o.   MRN: [id number]  HPI Audrey Brown is a 69 year old female with past medical history of Parkinson's, gastroparesis, GERD and IBS who seen in follow-up. She was last seen in October 2015. She's here today with her daughter and granddaughter. After her last visit she was restarted on domperidone 10 mg 4 times daily. This has helped tremendously with coughing, gagging sensation, as well as upper abdominal fullness and bloating. She has continued Nexium 40 mg twice daily. She is having intermittent lower abdominal cramping which seems to be unrelated to bowel movement. She reports bowel movements a been regular without blood in her stool or melena. If she doesn't eat she can have a loose urgent stools but if she eats small frequent meals this seems to not happen. She states she has learned to adjust her eating habits to prevent urgent bowel movements. She is occasionally having trouble with swallowing and previously had dilation. This is not necessarily always solids and is very intermittent.  She did have [facility] in late 2015 which she reports was negative.  Review of Systems As per history of present illness, otherwise negative  Current Medications, Allergies, Past Medical History, Past Surgical History, Family History and Social History were reviewed in [employer] record.     Objective:   Physical Exam BP 122/80 mmHg  Pulse 60  Resp 16 Constitutional: Well-developed and well-nourished. No distress. HEENT: Normocephalic and atraumatic.  Conjunctivae are normal.  No scleral icterus. Neck: Neck supple. Trachea midline. Cardiovascular: Normal rate, regular rhythm and intact distal pulses.  Pulmonary/chest: Effort normal and breath sounds normal. No wheezing, rales or rhonchi. Abdominal: Soft, nontender, nondistended. Bowel sounds active throughout.  Extremities: no  clubbing, cyanosis, or edema Neurological: Alert and oriented to person place and time. Skin: Skin is warm and dry. No rashes noted. Psychiatric: Normal mood and affect. Behavior is normal.  Last upper endoscopy September 2013 -- 5 cm hiatal hernia, 54 French Maloney dilator passed, biopsies done negative for eosinophilic esophagitis, dysplasia or malignancy. No Barrett's esophagus. Normal appearing stomach, clo negative.    Assessment & Plan:   69 year old female with past medical history of Parkinson's, gastroparesis, GERD and IBS who seen in follow-up.   1. GERD/gastroparesis/HH/dysphagia -- improvement in symptoms with domperidone. Continue 10 mg before meals and at bedtime. I'm not convinced she needs Nexium twice a day and will decrease to 40 mg once daily. If increase in reflux or heartburn symptoms, I asked that she notify me. She voices understanding. Given intermittent dysphagia have recommended barium swallow with tablet. If there is evidence for stricture than upper endoscopy with dilation can be pursued. I expect there is some dysmotility and this may account for symptoms but would not be expected to improve with dilation.  2. Abdominal cramping and bloating -- likely irritable bowel in nature. Trial of Levsin 0.125 mg 1-2 tablets every 6-8 hours as needed for cramping abdominal pain.  3. CRC screening -- negative late 2015, consider repeat Cologuard late 2018  Return in 9 months 25 minutes spent with the patient today

## 2015-05-02 ENCOUNTER — Ambulatory Visit (HOSPITAL_COMMUNITY)
Admission: RE | Admit: 2015-05-02 | Discharge: 2015-05-02 | Disposition: A | Discharge status: Home / Self Care | Payer: Medicare Other | Source: Ambulatory Visit | Attending: Internal Medicine | Admitting: Internal Medicine

## 2015-05-02 ENCOUNTER — Ambulatory Visit (INDEPENDENT_AMBULATORY_CARE_PROVIDER_SITE_OTHER): Payer: Medicare Other | Admitting: Family Medicine

## 2015-05-02 ENCOUNTER — Encounter: Payer: Self-pay | Admitting: Family Medicine

## 2015-05-02 VITALS — BP 100/74 | HR 74 | Temp 98.2°F | Ht 63.0 in

## 2015-05-02 DIAGNOSIS — R05 Cough: Secondary | ICD-10-CM | POA: Diagnosis not present

## 2015-05-02 DIAGNOSIS — K3184 Gastroparesis: Secondary | ICD-10-CM | POA: Diagnosis not present

## 2015-05-02 DIAGNOSIS — G20 Parkinson's disease: Secondary | ICD-10-CM | POA: Diagnosis not present

## 2015-05-02 DIAGNOSIS — J0100 Acute maxillary sinusitis, unspecified: Secondary | ICD-10-CM | POA: Diagnosis not present

## 2015-05-02 DIAGNOSIS — R1310 Dysphagia, unspecified: Secondary | ICD-10-CM | POA: Insufficient documentation

## 2015-05-02 MED ORDER — AZITHROMYCIN 250 MG PO TABS: ORAL_TABLET | ORAL | Status: DC

## 2015-05-02 MED ORDER — BENZONATATE 100 MG PO CAPS: 100.0000 mg | ORAL_CAPSULE | Freq: Three times a day (TID) | ORAL | Status: DC | PRN

## 2015-05-02 NOTE — Patient Instructions (Signed)

## 2015-05-02 NOTE — Progress Notes (Signed)
Pre visit review using our clinic review tool, if applicable. No additional management support is needed unless otherwise documented below in the visit note. 

## 2015-05-02 NOTE — Progress Notes (Addendum)
HPI:  Acute visit for:  ? Brochitis: -started: 10 days ago - now worsening -reports: nasal congestion, pnd, cough - productive, chills, sinus pain -denies: SOB, wheezing, fevers, NVD, tick bite, flu exposure -hx of: GERD -has tried: none, reports has taken zpack and tolerates this well  ROS: See pertinent positives and negatives per HPI.  Past Medical History  Diagnosis Date  . Headache(784.0)   . GERD (gastroesophageal reflux disease)   . corticobasilar degeneration     sees Dr. Tonye Royalty at [facility]  . Osteoarthritis     has seen Dr Beola Cord  . Bipolar depression     had seen Dr. Candis Schatz in past   . Dementia   . Gallstones   . Neuromuscular disorder   . Hiatal hernia 07/27/2012    EGD  . Parkinson disease   . HH (hiatus hernia)     Past Surgical History  Procedure Laterality Date  . Tonsillectomy    . Cesarean section    . Cervical disc surgery  1994    c-6   . Wrist surgery  06-24-2010    Dr Rush Farmer right wrist  . Neck surgery      Lumpectomy  . Foot neuroma surgery      Family History  Problem Relation Age of Onset  . Emphysema Mother   . Heart attack Father   . Hypertension    . Lung cancer      History   Social History  . Marital Status: Widowed    Spouse Name: N/A  . Number of Children: 4  . Years of Education: N/A   Occupational History  . Retired    Social History Main Topics  . Smoking status: Never Smoker   . Smokeless tobacco: Never Used  . Alcohol Use: No  . Drug Use: No  . Sexual Activity: Not on file   Other Topics Concern  . None   Social History Narrative   Daughter is her POA and must come to all visits in GI and [place]      Current outpatient prescriptions:  .  AMBULATORY NON FORMULARY MEDICATION, Domperidone 10 mg Please take one tablet by mouth four times daily before meals and at bedtime, Disp: 120 tablet, Rfl: 1 .  Bisacodyl (LAXATIVE PO), Take 8.6 mg by mouth daily. Pt takes Senna laxatives, Disp: , Rfl:   .  buPROPion (WELLBUTRIN SR) 200 MG 12 hr tablet, Take 300 mg by mouth daily., Disp: , Rfl:  .  BUSPIRONE HCL PO, Take 5 mg by mouth daily., Disp: , Rfl:  .  Calcium 500 MG CHEW, Chew 1 tablet by mouth daily. , Disp: , Rfl:  .  esomeprazole (NEXIUM) 40 MG capsule, Take 1 capsule (40 mg total) by mouth daily., Disp: 90 capsule, Rfl: 2 .  Eszopiclone (ESZOPICLONE) 3 MG TABS, Take 1 tablet (3 mg total) by mouth at bedtime. Take immediately before bedtime, Disp: 90 tablet, Rfl: 1 .  hyoscyamine (LEVSIN SL) 0.125 MG SL tablet, Place 1 tablet (0.125 mg total) under the tongue every 6 (six) hours as needed for cramping (1-2 tabs every 6 hr PRN)., Disp: 30 tablet, Rfl: 1 .  ibuprofen (ADVIL,MOTRIN) 800 MG tablet, TAKE 1 TABLET EVERY 8 HOURS AS NEEDED FOR PAIN, Disp: 270 tablet, Rfl: 0 .  LamoTRIgine XR (LAMICTAL XR) 200 MG TB24, Take 1 tablet (200 mg total) by mouth daily., Disp: 90 tablet, Rfl: 1 .  Magnesium 500 MG CAPS, Take 1 capsule by mouth daily., Disp: ,  Rfl:  .  oxyCODONE-acetaminophen (PERCOCET/ROXICET) 5-325 MG per tablet, Take 1 tablet by mouth daily as needed for severe pain., Disp: 20 tablet, Rfl: 0 .  promethazine (PHENERGAN) 25 MG tablet, Take 25 mg by mouth every 4 (four) hours as needed for nausea. , Disp: , Rfl:  .  azithromycin (ZITHROMAX) 250 MG tablet, 2 tabs daily on the first day then 1 tab daily for 4 more days, Disp: 6 tablet, Rfl: 0 .  benzonatate (TESSALON PERLES) 100 MG capsule, Take 1 capsule (100 mg total) by mouth 3 (three) times daily as needed for cough., Disp: 20 capsule, Rfl: 0  EXAM:  Filed Vitals:   05/02/15 1402  BP: 100/74  Pulse: 74  Temp: 98.2 F (36.8 C)    There is no weight on file to calculate BMI.  GENERAL: vitals reviewed and listed above, alert, oriented, appears well hydrated and in no acute distress  HEENT: atraumatic, conjunttiva clear, no obvious abnormalities on inspection of external nose and ears, normal appearance of ear canals and TMs,  clear nasal congestion, mild post oropharyngeal erythema with PND, no tonsillar edema or exudate, no sinus TTP  NECK: no obvious masses on inspection  LUNGS: clear to auscultation bilaterally, no wheezes, rales or rhonchi, good air movement  CV: HRRR, no peripheral edema  MS: moves all extremities without noticeable abnormality  PSYCH: pleasant and cooperative, no obvious depression or anxiety  ASSESSMENT AND PLAN:  Discussed the following assessment and plan:  Acute maxillary sinusitis, recurrence not specified - Plan: azithromycin (ZITHROMAX) 250 MG tablet, benzonatate (TESSALON PERLES) 100 MG capsule  -given going on for 10 days and worsening and with sinus pain she opted to start an antibiotic and cou medications, risks discussed, follow up as needed -Patient advised to return or notify a doctor immediately if symptoms worsen or persist or new concerns arise.  Patient Instructions  INSTRUCTIONS FOR UPPER RESPIRATORY INFECTION:  -plenty of rest and fluids  -nasal saline wash 2-3 times daily (use prepackaged nasal saline or bottled/distilled water if making your own)   -can use AFRIN nasal spray for drainage and nasal congestion - but do NOT use longer then 3-4 days  -can use tylenol (in no history of liver disease) or ibuprofen (if no history of kidney disease, bowel bleeding or significant heart disease) as directed for aches and sorethroat  -in the winter time, using a humidifier at night is helpful (please follow cleaning instructions)  -if you are taking a cough medication - use only as directed, may also try a teaspoon of honey to coat the throat and throat lozenges. If given a cough medication with codeine or hydrocodone or other narcotic please be advised that this contains a strong and  potentially addicting medication. Please follow instructions carefully, take as little as possible and only use AS NEEDED for severe cough. Discuss potential side effects with your  pharmacy. Please do not drive or operate machinery while taking these types of medications. Please do not take other sedating medications, drugs or alcohol while taking this medication without discussing with your doctor.  -for sore throat, salt water gargles can help  -follow up if you have fevers, facial pain, tooth pain, difficulty breathing or are worsening or symptoms persist longer then expected  Upper Respiratory Infection, Adult An upper respiratory infection (URI) is also known as the common cold. It is often caused by a type of germ (virus). Colds are easily spread (contagious). You can pass it to others by kissing, coughing,  sneezing, or drinking out of the same glass. Usually, you get better in 1 to 3  weeks.  However, the cough can last for even longer. HOME CARE   Only take medicine as told by your doctor. Follow instructions provided above.  Drink enough water and fluids to keep your pee (urine) clear or pale yellow.  Get plenty of rest.  Return to work when your temperature is < 100 for 24 hours or as told by your doctor. You may use a face mask and wash your hands to stop your cold from spreading. GET HELP RIGHT AWAY IF:   After the first few days, you feel you are getting worse.  You have questions about your medicine.  You have chills, shortness of breath, or red spit (mucus).  You have pain in the face for more then 1-2 days, especially when you bend forward.  You have a fever, puffy (swollen) neck, pain when you swallow, or white spots in the back of your throat.  You have a bad headache, ear pain, sinus pain, or chest pain.  You have a high-pitched whistling sound when you breathe in and out (wheezing).  You cough up blood.  You have sore muscles or a stiff neck. MAKE SURE YOU:   Understand these instructions.  Will watch your condition.  Will get help right away if you are not doing well or get worse. Document Released: 04/13/2008 Document Revised:  01/18/2012 Document Reviewed: 01/31/2014 [facility] Patient Information 2015 [place], [place]. This information is not intended to replace advice given to you by your health care provider. Make sure you discuss any questions you have with your health care provider.      Audrey Benton R.

## 2015-05-31 ENCOUNTER — Telehealth: Payer: Self-pay | Admitting: Internal Medicine

## 2015-05-31 MED ORDER — HYOSCYAMINE SULFATE 0.125 MG SL SUBL: 0.1250 mg | SUBLINGUAL_TABLET | Freq: Four times a day (QID) | SUBLINGUAL | Status: DC | PRN

## 2015-05-31 NOTE — Telephone Encounter (Signed)
Pharmacy was called to confirm rx.  rx was resent with as directed directions pt notified and pt also states she decreased nexium to once daily and reflux, coughing and gagging has returned.  Pt was advised she can restart BID dose and Dr Hilarie Fredrickson will be notified.

## 2015-06-03 MED ORDER — HYOSCYAMINE SULFATE 0.125 MG SL SUBL: 0.1250 mg | SUBLINGUAL_TABLET | Freq: Four times a day (QID) | SUBLINGUAL | Status: DC | PRN

## 2015-06-03 NOTE — Telephone Encounter (Signed)
Rx sent 

## 2015-06-19 ENCOUNTER — Encounter: Payer: Medicare Other | Attending: Physical Medicine & Rehabilitation | Admitting: Registered Nurse

## 2015-06-19 ENCOUNTER — Encounter: Payer: Self-pay | Admitting: Registered Nurse

## 2015-06-19 ENCOUNTER — Other Ambulatory Visit: Payer: Self-pay | Admitting: Registered Nurse

## 2015-06-19 VITALS — BP 152/74 | HR 78

## 2015-06-19 DIAGNOSIS — G894 Chronic pain syndrome: Secondary | ICD-10-CM | POA: Diagnosis not present

## 2015-06-19 DIAGNOSIS — Z79899 Other long term (current) drug therapy: Secondary | ICD-10-CM | POA: Diagnosis not present

## 2015-06-19 DIAGNOSIS — M47817 Spondylosis without myelopathy or radiculopathy, lumbosacral region: Secondary | ICD-10-CM | POA: Diagnosis not present

## 2015-06-19 DIAGNOSIS — G20 Parkinson's disease: Secondary | ICD-10-CM | POA: Diagnosis not present

## 2015-06-19 DIAGNOSIS — Z5181 Encounter for therapeutic drug level monitoring: Secondary | ICD-10-CM

## 2015-06-19 DIAGNOSIS — R269 Unspecified abnormalities of gait and mobility: Secondary | ICD-10-CM | POA: Insufficient documentation

## 2015-06-19 DIAGNOSIS — G8929 Other chronic pain: Secondary | ICD-10-CM | POA: Insufficient documentation

## 2015-06-19 DIAGNOSIS — M5489 Other dorsalgia: Secondary | ICD-10-CM | POA: Insufficient documentation

## 2015-06-19 MED ORDER — OXYCODONE-ACETAMINOPHEN 5-325 MG PO TABS: 1.0000 | ORAL_TABLET | Freq: Every day | ORAL | Status: DC | PRN

## 2015-06-19 NOTE — Progress Notes (Signed)
Subjective:    Patient ID: Audrey Brown, female    DOB: 05/28/1946, 69 y.o.   MRN: [id number]  HPI: [name] is a 69 year old female who returns for follow up for chronic pain and medication refill.She says her pain is located in her  Shoulder blades and entire back. She rates her pain 8. Her current exercise regime is walking with her walker in her home for short distances .  HerMedical diagnosis includes Parkinson's and Dementia.    Pain Inventory Average Pain 8 Pain Right Now 8 My pain is dull  In the last 24 hours, has pain interfered with the following? General activity 10 Relation with others 10 Enjoyment of life 10 What TIME of day is your pain at its worst? all Sleep (in general) NA  Pain is worse with: walking, bending, sitting, inactivity, standing and some activites Pain improves with: rest, heat/ice and medication Relief from Meds: 9  Mobility ability to climb steps?  no do you drive?  no use a wheelchair transfers alone  Function disabled: date disabled .  Neuro/Psych numbness tremor spasms confusion depression loss of taste or smell  Prior Studies Any changes since last visit?  no  Physicians involved in your care Any changes since last visit?  no   Family History  Problem Relation Age of Onset  . Emphysema Mother   . Heart attack Father   . Hypertension    . Lung cancer     Social History   Social History  . Marital Status: Widowed    Spouse Name: N/A  . Number of Children: 4  . Years of Education: N/A   Occupational History  . Retired    Social History Main Topics  . Smoking status: Never Smoker   . Smokeless tobacco: Never Used  . Alcohol Use: No  . Drug Use: No  . Sexual Activity: Not Asked   Other Topics Concern  . None   Social History Narrative   Daughter is her POA and must come to all visits in GI and [place]    Past Surgical History  Procedure Laterality Date  . Tonsillectomy    . Cesarean section      . Cervical disc surgery  1994    c-6   . Wrist surgery  06-24-2010    Dr Rush Farmer right wrist  . Neck surgery      Lumpectomy  . Foot neuroma surgery     Past Medical History  Diagnosis Date  . Headache(784.0)   . GERD (gastroesophageal reflux disease)   . corticobasilar degeneration     sees Dr. Tonye Royalty at [facility]  . Osteoarthritis     has seen Dr Beola Cord  . Bipolar depression     had seen Dr. Candis Schatz in past   . Dementia   . Gallstones   . Neuromuscular disorder   . Hiatal hernia 07/27/2012    EGD  . Parkinson disease   . HH (hiatus hernia)    BP 152/74 mmHg  Pulse 78  SpO2 97%  Opioid Risk Score:   Fall Risk Score:  `1  Depression screen PHQ 2/9  Depression screen [facility] 2/9 06/19/2015 03/19/2015 02/28/2015  Decreased Interest 0 0 0  Down, Depressed, Hopeless 0 0 0  PHQ - 2 Score 0 0 0  Altered sleeping - 0 -  Tired, decreased energy - 0 -  Change in appetite - 0 -  Feeling bad or failure about yourself  -  0 -  Trouble concentrating - 0 -  Moving slowly or fidgety/restless - 0 -  Suicidal thoughts - 0 -  PHQ-9 Score - 0 -     Review of Systems  Constitutional:       Loss of taste or smell  Respiratory: Positive for cough.   Gastrointestinal: Positive for nausea, vomiting and diarrhea.  Psychiatric/Behavioral: Positive for confusion and dysphoric mood.  All other systems reviewed and are negative.      Objective:   Physical Exam  Constitutional: She is oriented to person, place, and time. She appears well-developed and well-nourished.  HENT:  Head: Normocephalic and atraumatic.  Neck: Normal range of motion. Neck supple.  Cardiovascular: Normal rate and regular rhythm.   Pulmonary/Chest: Effort normal and breath sounds normal.  Musculoskeletal:  Normal Muscle Bulk and Muscle Testing Reveals: Upper Extremities: Full ROM and Muscle Strength 3/5 Thoracic and Lumbar Hypersensitivity Lower Extremities: Full ROM and Muscle strength 4/5 Right  Lower Extremity Flexion Produces pain into Hip Left Lower Extremity Flexion Produces Pain intoHip Arrived in Wheelchair  Neurological: She is alert and oriented to person, place, and time.  Skin: Skin is warm and dry.  Psychiatric: She has a normal mood and affect.  Nursing note and vitals reviewed.         Assessment & Plan:  1. Lumbar spondylosis10 months status post right L3-L4 medial branch L5 dorsal ramus radiofrequency: Refilled: Oxycodone 5/325 mg one tablet daily as needed for severe pain. She's on intermittent dose lasting three months. 2. Cervicothoracic myofascial pain syndrome: Continue with exercise, heat and ice therapy.   20 minutes of face to face patient care time was spent during this visit. All questions were encouraged and answered.  F/U 3 months

## 2015-06-21 LAB — PMP ALCOHOL METABOLITE (ETG)

## 2015-06-23 LAB — ETHYL GLUCURONIDE, URINE
ETGU: 2151 ng/mL — AB (ref <500)
Ethyl Sulfate (ETS): 409 ng/mL — ABNORMAL HIGH (ref <100)

## 2015-06-25 LAB — PRESCRIPTION MONITORING PROFILE (SOLSTAS)
Amphetamine/Meth: NEGATIVE ng/mL
BENZODIAZEPINE SCREEN, URINE: NEGATIVE ng/mL
BUPRENORPHINE, URINE: NEGATIVE ng/mL
Barbiturate Screen, Urine: NEGATIVE ng/mL
CREATININE, URINE: 120.13 mg/dL (ref >20.0)
Cannabinoid Scrn, Ur: NEGATIVE ng/mL
Carisoprodol, Urine: NEGATIVE ng/mL
Cocaine Metabolites: NEGATIVE ng/mL
FENTANYL URINE: NEGATIVE ng/mL
MDMA URINE: NEGATIVE ng/mL
Meperidine, Ur: NEGATIVE ng/mL
Methadone Screen, Urine: NEGATIVE ng/mL
NITRITES URINE, INITIAL: NEGATIVE ug/mL
OPIATE SCREEN, URINE: NEGATIVE ng/mL
OXYCODONE SCRN UR: NEGATIVE ng/mL
Propoxyphene: NEGATIVE ng/mL
TAPENTADOLUR: NEGATIVE ng/mL
Tramadol Scrn, Ur: NEGATIVE ng/mL
Zolpidem, Urine: NEGATIVE ng/mL
pH, Initial: 5.6 pH (ref 4.5–8.9)

## 2015-06-28 NOTE — Progress Notes (Addendum)
Urine drug screen for this encounter is inconsistent . Positive for alcohol.  A formal warning letter will be mailed to patient.

## 2015-07-07 ENCOUNTER — Other Ambulatory Visit: Payer: Self-pay | Admitting: Family Medicine

## 2015-07-11 ENCOUNTER — Telehealth: Payer: Self-pay | Admitting: Internal Medicine

## 2015-07-11 MED ORDER — PROMETHAZINE HCL 25 MG PO TABS: 25.0000 mg | ORAL_TABLET | ORAL | Status: DC | PRN

## 2015-07-11 NOTE — Telephone Encounter (Signed)
Ensure that she is continuing on domperidone given history of gastroparesis Can refill Phenergan Levsin was also given recently for lower abdominal cramping which she can use as directed

## 2015-07-11 NOTE — Telephone Encounter (Signed)
Per patient, she is taking domperidone and Levsin. I advised that we will go ahead and send phenergan rx to pharmacy.

## 2015-07-11 NOTE — Telephone Encounter (Signed)
Patient states that se has been having intermittent nausea/vomiting x 1 week as well as lower abdominal cramping. Eating does not seem to make any difference in symptoms. Patient is currently taking Nexium 40 mg 1-2 times daily and is eating "light" foods. She would like a refill on phenergan (hx gastroparesis 2013). May I refill phenergan?

## 2015-07-17 ENCOUNTER — Telehealth: Payer: Self-pay | Admitting: *Deleted

## 2015-07-17 NOTE — Telephone Encounter (Signed)
Patient would like Dr. Letta Pate to call her if possible, if not she would like a call from the senior nurse working with Dr. Letta Pate.  Patient states, "there appears to be a communication problem".

## 2015-07-18 NOTE — Telephone Encounter (Signed)
I spoke with Audrey Brown and she is very angry about the letter she received warning her about alcohol use.  She says she drinks maybe once a year and obviously this was her one time.  I explained that this letter was not singling her out, that this is our policy to formally warn patients that we do not advise combining alcohol and narcotic medications.  Previously we have discharged patients or made them non narcotic treatment only but have decided to issue a formal warning before moving to that next step.  She is angry and says "I guess this letter is in my record and will follow me all my god damned life". I replied that yes it is in her record and she is welcomed to discuss further with Dr Letta Pate at her next appointment.

## 2015-09-12 ENCOUNTER — Encounter: Payer: Medicare Other | Admitting: Registered Nurse

## 2015-09-24 ENCOUNTER — Telehealth: Payer: Self-pay | Admitting: Family Medicine

## 2015-09-24 NOTE — Telephone Encounter (Signed)
Pt request refill of the following: LUNESTA    Phamacy: [facility]

## 2015-09-25 NOTE — Telephone Encounter (Signed)
Call in Lunesta 3 mg qhs, #90 with one rf

## 2015-09-25 NOTE — Telephone Encounter (Signed)
refill 

## 2015-09-26 ENCOUNTER — Telehealth: Payer: Self-pay

## 2015-09-26 MED ORDER — ESZOPICLONE 3 MG PO TABS: 3.0000 mg | ORAL_TABLET | Freq: Every day | ORAL | Status: DC

## 2015-09-26 NOTE — Telephone Encounter (Signed)
Pt request Eszopiclone 3mg  Pt last visit 05/02/15 with Dr Maudie Mercury  Last Rx refill 10/29/14 #90 with 5 refills   Please advise

## 2015-09-26 NOTE — Telephone Encounter (Signed)
Spoke to [employer] and Rx can not be call in because is a controlled substance

## 2015-09-26 NOTE — Telephone Encounter (Signed)
Call in #90 with 5 rf 

## 2015-09-27 NOTE — Telephone Encounter (Signed)
Please call in, not fax.

## 2015-09-30 MED ORDER — ESZOPICLONE 3 MG PO TABS: ORAL_TABLET | ORAL | Status: DC

## 2015-09-30 NOTE — Telephone Encounter (Signed)
I printed script, faxed to [facility] and spoke with pt.

## 2015-09-30 NOTE — Addendum Note (Signed)
Addended by: Aggie Hacker A on: 09/30/2015 05:16 PM   Modules accepted: Orders

## 2015-10-07 ENCOUNTER — Telehealth: Payer: Self-pay | Admitting: *Deleted

## 2015-10-07 ENCOUNTER — Telehealth: Payer: Self-pay | Admitting: Internal Medicine

## 2015-10-07 MED ORDER — HYOSCYAMINE SULFATE 0.125 MG SL SUBL: 0.1250 mg | SUBLINGUAL_TABLET | Freq: Four times a day (QID) | SUBLINGUAL | Status: DC | PRN

## 2015-10-07 MED ORDER — AMBULATORY NON FORMULARY MEDICATION: Status: DC

## 2015-10-07 NOTE — Telephone Encounter (Signed)
Error

## 2015-10-07 NOTE — Telephone Encounter (Signed)
Rx sent for Levsin to [facility] and domperidone sent to [place] pharmacy online. Patient advised.

## 2015-10-14 ENCOUNTER — Telehealth: Payer: Self-pay | Admitting: Internal Medicine

## 2015-10-15 MED ORDER — AMBULATORY NON FORMULARY MEDICATION: Status: DC

## 2015-10-15 NOTE — Telephone Encounter (Signed)
I sent rx on 11/28 but have sent rx again since [place] online did not get rx. Patient advised.

## 2015-12-25 ENCOUNTER — Encounter: Payer: Self-pay | Admitting: Family Medicine

## 2015-12-25 ENCOUNTER — Other Ambulatory Visit (INDEPENDENT_AMBULATORY_CARE_PROVIDER_SITE_OTHER): Payer: Medicare Other

## 2015-12-25 ENCOUNTER — Ambulatory Visit (INDEPENDENT_AMBULATORY_CARE_PROVIDER_SITE_OTHER): Payer: Medicare Other | Admitting: Family Medicine

## 2015-12-25 VITALS — BP 128/81 | HR 78 | Temp 98.6°F

## 2015-12-25 DIAGNOSIS — Z0000 Encounter for general adult medical examination without abnormal findings: Secondary | ICD-10-CM

## 2015-12-25 DIAGNOSIS — F3160 Bipolar disorder, current episode mixed, unspecified: Secondary | ICD-10-CM

## 2015-12-25 DIAGNOSIS — K219 Gastro-esophageal reflux disease without esophagitis: Secondary | ICD-10-CM

## 2015-12-25 DIAGNOSIS — R55 Syncope and collapse: Secondary | ICD-10-CM

## 2015-12-25 DIAGNOSIS — G20 Parkinson's disease: Secondary | ICD-10-CM

## 2015-12-25 DIAGNOSIS — F329 Major depressive disorder, single episode, unspecified: Secondary | ICD-10-CM | POA: Diagnosis not present

## 2015-12-25 DIAGNOSIS — G20A1 Parkinson's disease without dyskinesia, without mention of fluctuations: Secondary | ICD-10-CM

## 2015-12-25 DIAGNOSIS — Z23 Encounter for immunization: Secondary | ICD-10-CM | POA: Diagnosis not present

## 2015-12-25 DIAGNOSIS — F32A Depression, unspecified: Secondary | ICD-10-CM

## 2015-12-25 LAB — BASIC METABOLIC PANEL
BUN: 11 mg/dL (ref 6–23)
CHLORIDE: 109 meq/L (ref 96–112)
CO2: 29 mEq/L (ref 19–32)
CREATININE: 0.66 mg/dL (ref 0.40–1.20)
Calcium: 9.5 mg/dL (ref 8.4–10.5)
GFR: 94.83 mL/min (ref >60.00)
Glucose, Bld: 97 mg/dL (ref 70–99)
Potassium: 4.8 mEq/L (ref 3.5–5.1)
Sodium: 145 mEq/L (ref 135–145)

## 2015-12-25 LAB — POC URINALSYSI DIPSTICK (AUTOMATED)
BILIRUBIN UA: NEGATIVE
GLUCOSE UA: NEGATIVE
KETONES UA: NEGATIVE
NITRITE UA: NEGATIVE
Protein, UA: NEGATIVE
RBC UA: NEGATIVE
Spec Grav, UA: 1.02
Urobilinogen, UA: 0.2
pH, UA: 6.5

## 2015-12-25 LAB — CBC WITH DIFFERENTIAL/PLATELET
BASOS PCT: 0.8 % (ref 0.0–3.0)
Basophils Absolute: 0.1 10*3/uL (ref 0.0–0.1)
EOS ABS: 0.3 10*3/uL (ref 0.0–0.7)
Eosinophils Relative: 4 % (ref 0.0–5.0)
HCT: 41.3 % (ref 36.0–46.0)
HEMOGLOBIN: 14.2 g/dL (ref 12.0–15.0)
Lymphocytes Relative: 33.6 % (ref 12.0–46.0)
Lymphs Abs: 2.4 10*3/uL (ref 0.7–4.0)
MCHC: 34.3 g/dL (ref 30.0–36.0)
MCV: 86.6 fl (ref 78.0–100.0)
MONO ABS: 0.5 10*3/uL (ref 0.1–1.0)
Monocytes Relative: 6.6 % (ref 3.0–12.0)
NEUTROS PCT: 55 % (ref 43.0–77.0)
Neutro Abs: 3.8 10*3/uL (ref 1.4–7.7)
PLATELETS: 300 10*3/uL (ref 150.0–400.0)
RBC: 4.77 Mil/uL (ref 3.87–5.11)
RDW: 13.3 % (ref 11.5–15.5)
WBC: 7 10*3/uL (ref 4.0–10.5)

## 2015-12-25 LAB — HEPATIC FUNCTION PANEL
ALT: 12 U/L (ref 0–35)
AST: 13 U/L (ref 0–37)
Albumin: 4.2 g/dL (ref 3.5–5.2)
Alkaline Phosphatase: 56 U/L (ref 39–117)
BILIRUBIN DIRECT: 0.1 mg/dL (ref 0.0–0.3)
BILIRUBIN TOTAL: 0.5 mg/dL (ref 0.2–1.2)
Total Protein: 6.9 g/dL (ref 6.0–8.3)

## 2015-12-25 LAB — LIPID PANEL
CHOL/HDL RATIO: 4
Cholesterol: 225 mg/dL — ABNORMAL HIGH (ref 0–200)
HDL: 59.5 mg/dL (ref >39.00)
LDL CALC: 147 mg/dL — AB (ref 0–99)
NONHDL: 165.32
TRIGLYCERIDES: 91 mg/dL (ref 0.0–149.0)
VLDL: 18.2 mg/dL (ref 0.0–40.0)

## 2015-12-25 LAB — TSH: TSH: 2.47 u[IU]/mL (ref 0.35–4.50)

## 2015-12-25 MED ORDER — PROMETHAZINE HCL 25 MG PO TABS: 25.0000 mg | ORAL_TABLET | ORAL | Status: DC | PRN

## 2015-12-25 MED ORDER — OXYCODONE-ACETAMINOPHEN 5-325 MG PO TABS: 1.0000 | ORAL_TABLET | Freq: Four times a day (QID) | ORAL | Status: DC | PRN

## 2015-12-25 NOTE — Progress Notes (Signed)
   Subjective:    Patient ID: Audrey Brown, female    DOB: [birth year], 70 y.o.   MRN: [id number]  HPI 70 yr old female for a cpx. She feels well except for occasional spells of lightheadedness where she feels like she might passed out, though she has not truly lost consciousness. No SOB or chest pain. She sees Dr. Tonye Royalty regularly.  Review of Systems  Constitutional: Negative.   HENT: Negative.   Eyes: Negative.   Respiratory: Negative.   Cardiovascular: Negative.   Gastrointestinal: Negative.   Genitourinary: Negative for dysuria, urgency, frequency, hematuria, flank pain, decreased urine volume, enuresis, difficulty urinating, pelvic pain and dyspareunia.  Musculoskeletal: Negative.   Skin: Negative.   Neurological: Positive for dizziness, syncope, facial asymmetry and speech difficulty. Negative for seizures, light-headedness, numbness and headaches.  Psychiatric/Behavioral: Negative.        Objective:   Physical Exam  Constitutional: She is oriented to person, place, and time. She appears well-developed and well-nourished. No distress.  In her wheelchair  HENT:  Head: Normocephalic and atraumatic.  Right Ear: External ear normal.  Left Ear: External ear normal.  Nose: Nose normal.  Mouth/Throat: Oropharynx is clear and moist. No oropharyngeal exudate.  Eyes: Conjunctivae and EOM are normal. Pupils are equal, round, and reactive to light. No scleral icterus.  Neck: Normal range of motion. Neck supple. No JVD present. No thyromegaly present.  Cardiovascular: Normal rate, regular rhythm, normal heart sounds and intact distal pulses.  Exam reveals no gallop and no friction rub.   No murmur heard. Pulmonary/Chest: Effort normal and breath sounds normal. No respiratory distress. She has no wheezes. She has no rales. She exhibits no tenderness.  Abdominal: Soft. Bowel sounds are normal. She exhibits no distension and no mass. There is no tenderness. There is no rebound and no  guarding.  Musculoskeletal: Normal range of motion. She exhibits no edema or tenderness.  Lymphadenopathy:    She has no cervical adenopathy.  Neurological: She is alert and oriented to person, place, and time. She has normal reflexes. No cranial nerve deficit. She exhibits normal muscle tone. Coordination normal.  Skin: Skin is warm and dry. No rash noted. No erythema.  Psychiatric: She has a normal mood and affect. Her behavior is normal. Judgment and thought content normal.          Assessment & Plan:  Follow up visit. We discussed diet and exericse. She will see Dr. Tonye Royalty for the Parkinsonism. Her HTN is stable. Her GERD and bipolar disorder are stable. She sees the PCP at the [facility] clinic once a year. For the syncopal spells we will set her up for a brain MRI and carotid dopplers. Set up a 3 hour GTT to rule out hypoglycemia.

## 2015-12-25 NOTE — Progress Notes (Signed)
Pre visit review using our clinic review tool, if applicable. No additional management support is needed unless otherwise documented below in the visit note. Pt declined to weigh today, she is unsteady.

## 2015-12-25 NOTE — Addendum Note (Signed)
Addended by: Aggie Hacker A on: 12/25/2015 03:22 PM   Modules accepted: Orders

## 2015-12-26 ENCOUNTER — Other Ambulatory Visit: Payer: Self-pay | Admitting: Family Medicine

## 2015-12-26 ENCOUNTER — Other Ambulatory Visit (INDEPENDENT_AMBULATORY_CARE_PROVIDER_SITE_OTHER): Payer: Medicare Other

## 2015-12-26 DIAGNOSIS — R55 Syncope and collapse: Secondary | ICD-10-CM

## 2015-12-26 LAB — GLUCOSE TOLERANCE, 3 HOURS
GLUCOSE 1 HOUR GTT: 99 mg/dL
Glucose, 2 hour: 96 mg/dL
Glucose, Fasting: 104 mg/dL — ABNORMAL HIGH (ref 70–99)
Glucose, GTT - 3 Hour: 65 mg/dL

## 2015-12-31 ENCOUNTER — Ambulatory Visit (HOSPITAL_COMMUNITY)
Admission: RE | Admit: 2015-12-31 | Discharge: 2015-12-31 | Disposition: A | Discharge status: Home / Self Care | Payer: Medicare Other | Source: Ambulatory Visit | Attending: Cardiovascular Disease | Admitting: Cardiovascular Disease

## 2015-12-31 DIAGNOSIS — I6523 Occlusion and stenosis of bilateral carotid arteries: Secondary | ICD-10-CM | POA: Insufficient documentation

## 2015-12-31 DIAGNOSIS — R55 Syncope and collapse: Secondary | ICD-10-CM | POA: Diagnosis not present

## 2016-01-01 ENCOUNTER — Telehealth: Payer: Self-pay | Admitting: Family Medicine

## 2016-01-01 MED ORDER — HYDROCODONE-HOMATROPINE 5-1.5 MG/5ML PO SYRP: 5.0000 mL | ORAL_SOLUTION | ORAL | Status: DC | PRN

## 2016-01-01 NOTE — Telephone Encounter (Signed)
rx ready for pickup 

## 2016-01-01 NOTE — Telephone Encounter (Signed)
Pt call to ask for a rx for a cough . She has a terrible cough .Marland Kitchen     Pharmacy [facility]

## 2016-01-01 NOTE — Telephone Encounter (Signed)
Script is ready for pick up and I spoke with pt.  

## 2016-01-02 ENCOUNTER — Other Ambulatory Visit: Payer: Self-pay | Admitting: Family Medicine

## 2016-01-03 ENCOUNTER — Ambulatory Visit
Admission: RE | Admit: 2016-01-03 | Discharge: 2016-01-03 | Disposition: A | Discharge status: Home / Self Care | Payer: Medicare Other | Source: Ambulatory Visit | Attending: Family Medicine | Admitting: Family Medicine

## 2016-01-03 DIAGNOSIS — R55 Syncope and collapse: Secondary | ICD-10-CM | POA: Diagnosis not present

## 2016-01-03 MED ORDER — GADOBENATE DIMEGLUMINE 529 MG/ML IV SOLN
16.0000 mL | Freq: Once | INTRAVENOUS | Status: AC | PRN
  Administered 2016-01-03: 16 mL via INTRAVENOUS

## 2016-01-06 ENCOUNTER — Ambulatory Visit (INDEPENDENT_AMBULATORY_CARE_PROVIDER_SITE_OTHER): Payer: Medicare Other | Admitting: Family Medicine

## 2016-01-06 ENCOUNTER — Encounter: Payer: Self-pay | Admitting: Family Medicine

## 2016-01-06 VITALS — BP 130/90 | HR 75 | Temp 98.4°F

## 2016-01-06 DIAGNOSIS — R05 Cough: Secondary | ICD-10-CM | POA: Diagnosis not present

## 2016-01-06 DIAGNOSIS — J0100 Acute maxillary sinusitis, unspecified: Secondary | ICD-10-CM | POA: Diagnosis not present

## 2016-01-06 DIAGNOSIS — R059 Cough, unspecified: Secondary | ICD-10-CM

## 2016-01-06 MED ORDER — DOXYCYCLINE HYCLATE 100 MG PO TABS: 100.0000 mg | ORAL_TABLET | Freq: Two times a day (BID) | ORAL | Status: DC

## 2016-01-06 MED ORDER — BENZONATATE 100 MG PO CAPS: 100.0000 mg | ORAL_CAPSULE | Freq: Three times a day (TID) | ORAL | Status: DC

## 2016-01-06 NOTE — Progress Notes (Signed)
Pre visit review using our clinic review tool, if applicable. No additional management support is needed unless otherwise documented below in the visit note. 

## 2016-01-06 NOTE — Progress Notes (Signed)
Subjective:    Patient ID: Audrey Brown, female    DOB: 09-26-1946, 70 y.o.   MRN: [id number]  HPI  Audrey Brown is a 70 year old female who presents today with a dry cough, rhinitis,  sinus pressure/pain, and nausea without vomiting for 10-12 days.  Upon entering the exam room, patient coughing and appears ill. She denies SOB, chest pain, ear pain, dental pain, recent sick contacts, or recent antibiotic use. Treatment at home includes hydromet for cough at night which does help her sleep.  She uses a wheelchair and is accompanied by a caregiver today.  Patient has recently received a pneumococcal immunization, has a history of GERD and is UTD with her influenza vaccine.  Review of Systems  Constitutional: Negative for fever, chills and fatigue.  HENT: Positive for congestion, postnasal drip, rhinorrhea and sinus pressure. Negative for ear pain and sore throat.   Respiratory: Positive for cough. Negative for chest tightness, shortness of breath and wheezing.   Cardiovascular: Negative for chest pain and palpitations.  Gastrointestinal: Positive for nausea. Negative for vomiting, abdominal pain and diarrhea.  Skin: Negative for rash.  Neurological: Negative for dizziness, light-headedness and headaches.   Past Medical History  Diagnosis Date  . Headache(784.0)   . GERD (gastroesophageal reflux disease)   . corticobasilar degeneration     sees Dr. Tonye Royalty at [facility]  . Osteoarthritis     has seen Dr Beola Cord  . Bipolar depression ([place])     had seen Dr. Candis Schatz in past   . Dementia   . Gallstones   . Neuromuscular disorder ([place])   . Hiatal hernia 07/27/2012    EGD  . Parkinson disease ([place])   . HH (hiatus hernia)     Social History   Social History  . Marital Status: Widowed    Spouse Name: N/A  . Number of Children: 4  . Years of Education: N/A   Occupational History  . Retired    Social History Main Topics  . Smoking status: Never Smoker   . Smokeless  tobacco: Never Used  . Alcohol Use: No  . Drug Use: No  . Sexual Activity: Not on file   Other Topics Concern  . Not on file   Social History Narrative   Daughter is her POA and must come to all visits in GI and [place]     Past Surgical History  Procedure Laterality Date  . Tonsillectomy    . Cesarean section    . Cervical disc surgery  1994    c-6   . Wrist surgery  06-24-2010    Dr Rush Farmer right wrist  . Neck surgery      Lumpectomy  . Foot neuroma surgery    . Cholecystectomy      Family History  Problem Relation Age of Onset  . Emphysema Mother   . Heart attack Father   . Hypertension    . Lung cancer      Allergies  Allergen Reactions  . Celebrex [Celecoxib]     REACTION: hives  . Morphine And Related Hives  . Penicillins   . Risperdal [Risperidone]     Severe tremors  . Statins Other (See Comments)    Muscle aches     Current Outpatient Prescriptions on File Prior to Visit  Medication Sig Dispense Refill  . AMBULATORY NON FORMULARY MEDICATION Domperidone 10 mg Please take one tablet by mouth four times daily before meals and at bedtime 120 tablet 1  .  Bisacodyl (LAXATIVE PO) Take 8.6 mg by mouth daily. Pt takes Senna laxatives    . buPROPion (WELLBUTRIN SR) 200 MG 12 hr tablet Take 300 mg by mouth daily.    . BUSPIRONE HCL PO Take 5 mg by mouth daily.    . Calcium 500 MG CHEW Chew 1 tablet by mouth daily.     Marland Kitchen esomeprazole (NEXIUM) 40 MG capsule Take 1 capsule (40 mg total) by mouth daily. 90 capsule 2  . Eszopiclone (ESZOPICLONE) 3 MG TABS Take once a day at bedtime 90 tablet 1  . HYDROcodone-homatropine (HYDROMET) 5-1.5 MG/5ML syrup Take 5 mLs by mouth every 4 (four) hours as needed. 240 mL 0  . hyoscyamine (LEVSIN SL) 0.125 MG SL tablet Place 1 tablet (0.125 mg total) under the tongue every 6 (six) hours as needed for cramping. 120 tablet 1  . ibuprofen (ADVIL,MOTRIN) 800 MG tablet TAKE 1 TABLET EVERY 8 HOURS AS NEEDED FOR PAIN 270 tablet 1  .  LamoTRIgine XR (LAMICTAL XR) 200 MG TB24 Take 1 tablet (200 mg total) by mouth daily. 90 tablet 1  . Magnesium 500 MG CAPS Take 1 capsule by mouth daily.    Marland Kitchen oxyCODONE-acetaminophen (PERCOCET/ROXICET) 5-325 MG tablet Take 1 tablet by mouth every 6 (six) hours as needed for severe pain. 120 tablet 0  . promethazine (PHENERGAN) 25 MG tablet Take 1 tablet (25 mg total) by mouth every 4 (four) hours as needed for nausea. 60 tablet 3   No current facility-administered medications on file prior to visit.    BP 130/90 mmHg  Pulse 75  Temp(Src) 98.4 F (36.9 C) (Oral)  SpO2 96%     Objective:   Physical Exam  Constitutional: She is oriented to person, place, and time. She appears well-developed and well-nourished.  HENT:  Left Ear: Tympanic membrane normal.  Nose: Rhinorrhea present. Right sinus exhibits maxillary sinus tenderness. Left sinus exhibits maxillary sinus tenderness.  Mouth/Throat: Mucous membranes are normal. Posterior oropharyngeal erythema present.  Right TM unable to visualize due to cerumen. Denies ear pain at this time. Mild post oropharyngeal erythema and post nasal drip noted.  Eyes: Pupils are equal, round, and reactive to light.  Cardiovascular: Normal rate, regular rhythm and normal heart sounds.   Pulmonary/Chest: Effort normal and breath sounds normal. She has no wheezes. She has no rales.  Abdominal: Soft. Bowel sounds are normal. There is no tenderness.  Lymphadenopathy:    She has cervical adenopathy.  Neurological: She is alert and oriented to person, place, and time.  Skin: Skin is warm and dry.  Psychiatric: She has a normal mood and affect. Her behavior is normal.      Assessment & Plan:  1. Acute maxillary sinusitis, recurrence not specified  - doxycycline (VIBRA-TABS) 100 MG tablet; Take 1 tablet (100 mg total) by mouth 2 (two) times daily.  Dispense: 20 tablet; Refill: 0 Rx provided due to symptoms persisting for 10-12 days and worsening with sinus  pain noted. Risks discussed and advised to follow up if symptoms do not improve in 3-4 days, worsen, or fever >101 develops.  2. Cough  - benzonatate (TESSALON) 100 MG capsule; Take 1 capsule (100 mg total) by mouth 3 (three) times daily.  Dispense: 20 capsule; Refill: 0

## 2016-01-06 NOTE — Patient Instructions (Signed)
Please take antibiotic as directed and you may use Mucinex DM for cough or benzonatate for cough that is not managed with Mucinex DM. Rest, increase fluids, and return to clinic for further evaluation if symptoms do not improve in 2-3 days, worsen, or you develop a fever >101.  Sinusitis, Adult Sinusitis is redness, soreness, and inflammation of the paranasal sinuses. Paranasal sinuses are air pockets within the bones of your face. They are located beneath your eyes, in the middle of your forehead, and above your eyes. In healthy paranasal sinuses, mucus is able to drain out, and air is able to circulate through them by way of your nose. However, when your paranasal sinuses are inflamed, mucus and air can become trapped. This can allow bacteria and other germs to grow and cause infection. Sinusitis can develop quickly and last only a short time (acute) or continue over a long period (chronic). Sinusitis that lasts for more than 12 weeks is considered chronic. CAUSES Causes of sinusitis include:  Allergies.  Structural abnormalities, such as displacement of the cartilage that separates your nostrils (deviated septum), which can decrease the air flow through your nose and sinuses and affect sinus drainage.  Functional abnormalities, such as when the small hairs (cilia) that line your sinuses and help remove mucus do not work properly or are not present. SIGNS AND SYMPTOMS Symptoms of acute and chronic sinusitis are the same. The primary symptoms are pain and pressure around the affected sinuses. Other symptoms include:  Upper toothache.  Earache.  Headache.  Bad breath.  Decreased sense of smell and taste.  A cough, which worsens when you are lying flat.  Fatigue.  Fever.  Thick drainage from your nose, which often is green and may contain pus (purulent).  Swelling and warmth over the affected sinuses. DIAGNOSIS Your health care provider will perform a physical exam. During your  exam, your health care provider may perform any of the following to help determine if you have acute sinusitis or chronic sinusitis:  Look in your nose for signs of abnormal growths in your nostrils (nasal polyps).  Tap over the affected sinus to check for signs of infection.  View the inside of your sinuses using an imaging device that has a light attached (endoscope). If your health care provider suspects that you have chronic sinusitis, one or more of the following tests may be recommended:  Allergy tests.  Nasal culture. A sample of mucus is taken from your nose, sent to a lab, and screened for bacteria.  Nasal cytology. A sample of mucus is taken from your nose and examined by your health care provider to determine if your sinusitis is related to an allergy. TREATMENT Most cases of acute sinusitis are related to a viral infection and will resolve on their own within 10 days. Sometimes, medicines are prescribed to help relieve symptoms of both acute and chronic sinusitis. These may include pain medicines, decongestants, nasal steroid sprays, or saline sprays. However, for sinusitis related to a bacterial infection, your health care provider will prescribe antibiotic medicines. These are medicines that will help kill the bacteria causing the infection. Rarely, sinusitis is caused by a fungal infection. In these cases, your health care provider will prescribe antifungal medicine. For some cases of chronic sinusitis, surgery is needed. Generally, these are cases in which sinusitis recurs more than 3 times per year, despite other treatments. HOME CARE INSTRUCTIONS  Drink plenty of water. Water helps thin the mucus so your sinuses can drain more  easily.  Use a humidifier.  Inhale steam 3-4 times a day (for example, sit in the bathroom with the shower running).  Apply a warm, moist washcloth to your face 3-4 times a day, or as directed by your health care provider.  Use saline nasal sprays  to help moisten and clean your sinuses.  Take medicines only as directed by your health care provider.  If you were prescribed either an antibiotic or antifungal medicine, finish it all even if you start to feel better. SEEK IMMEDIATE MEDICAL CARE IF:  You have increasing pain or severe headaches.  You have nausea, vomiting, or drowsiness.  You have swelling around your face.  You have vision problems.  You have a stiff neck.  You have difficulty breathing.   This information is not intended to replace advice given to you by your health care provider. Make sure you discuss any questions you have with your health care provider.   Document Released: 10/26/2005 Document Revised: 11/16/2014 Document Reviewed: 11/10/2011 Elsevier Interactive Patient Education [employer].

## 2016-01-22 ENCOUNTER — Other Ambulatory Visit: Payer: Self-pay | Admitting: Internal Medicine

## 2016-02-17 ENCOUNTER — Other Ambulatory Visit: Payer: Self-pay

## 2016-02-17 DIAGNOSIS — Z1231 Encounter for screening mammogram for malignant neoplasm of breast: Secondary | ICD-10-CM

## 2016-03-12 ENCOUNTER — Telehealth: Payer: Self-pay | Admitting: Internal Medicine

## 2016-03-12 MED ORDER — ESOMEPRAZOLE MAGNESIUM 40 MG PO CPDR: 40.0000 mg | DELAYED_RELEASE_CAPSULE | Freq: Every day | ORAL | Status: DC

## 2016-03-12 MED ORDER — AMBULATORY NON FORMULARY MEDICATION: Status: DC

## 2016-03-12 NOTE — Telephone Encounter (Signed)
Patient advised that I am happy to refill rx's but she needs to schedule follow up office visit (was supposed to have a 9 month follow up). Patient has scheduled next available office visit 713/17 @ 10 am.

## 2016-03-17 ENCOUNTER — Ambulatory Visit: Payer: Self-pay

## 2016-03-26 ENCOUNTER — Telehealth: Payer: Self-pay | Admitting: Internal Medicine

## 2016-03-26 ENCOUNTER — Telehealth: Payer: Self-pay | Admitting: Family Medicine

## 2016-03-26 MED ORDER — ESOMEPRAZOLE MAGNESIUM 40 MG PO CPDR: 40.0000 mg | DELAYED_RELEASE_CAPSULE | Freq: Two times a day (BID) | ORAL | Status: DC

## 2016-03-26 NOTE — Telephone Encounter (Signed)
Patient states that her insurance will start requiring prior authorization for Nexium after July 2017 so she would like rx sent to pharmacy prior to that. She states insurance now prefers Aciphex. Unfortunately, she has tried and failed this before. Patient never received rx for Nexium that we sent back on 03/12/16 to [facility] (confirmation by them received in our system) so she also needs a 30 day script sent to local pharmacy. Rx's have been sent to [facility] again as well as to [facility]. We will do prior authorization when required by insurance.

## 2016-03-26 NOTE — Telephone Encounter (Signed)
Pt request refill  eszopiclone (LUNESTA) 2 MG TABS  Pt states she is going to need a prior auth in July and would like to get this refilled before then.  [facility]

## 2016-03-27 MED ORDER — ESZOPICLONE 3 MG PO TABS: ORAL_TABLET | ORAL | Status: DC

## 2016-03-27 NOTE — Telephone Encounter (Signed)
Script was faxed.

## 2016-03-27 NOTE — Telephone Encounter (Signed)
Ready to fax  

## 2016-04-06 ENCOUNTER — Emergency Department (HOSPITAL_COMMUNITY): Payer: Medicare Other

## 2016-04-06 ENCOUNTER — Emergency Department (HOSPITAL_COMMUNITY)
Admission: EM | Admit: 2016-04-06 | Discharge: 2016-04-06 | Disposition: A | Discharge status: Home / Self Care | Payer: Medicare Other | Attending: Emergency Medicine | Admitting: Emergency Medicine

## 2016-04-06 ENCOUNTER — Encounter (HOSPITAL_COMMUNITY): Payer: Self-pay | Admitting: Emergency Medicine

## 2016-04-06 DIAGNOSIS — R251 Tremor, unspecified: Secondary | ICD-10-CM | POA: Diagnosis not present

## 2016-04-06 DIAGNOSIS — G20 Parkinson's disease: Secondary | ICD-10-CM | POA: Diagnosis not present

## 2016-04-06 DIAGNOSIS — W19XXXA Unspecified fall, initial encounter: Secondary | ICD-10-CM

## 2016-04-06 DIAGNOSIS — M542 Cervicalgia: Secondary | ICD-10-CM | POA: Diagnosis not present

## 2016-04-06 DIAGNOSIS — R0781 Pleurodynia: Secondary | ICD-10-CM | POA: Insufficient documentation

## 2016-04-06 DIAGNOSIS — R51 Headache: Secondary | ICD-10-CM | POA: Diagnosis not present

## 2016-04-06 DIAGNOSIS — M546 Pain in thoracic spine: Secondary | ICD-10-CM | POA: Diagnosis not present

## 2016-04-06 DIAGNOSIS — M545 Low back pain, unspecified: Secondary | ICD-10-CM

## 2016-04-06 DIAGNOSIS — F319 Bipolar disorder, unspecified: Secondary | ICD-10-CM | POA: Diagnosis not present

## 2016-04-06 DIAGNOSIS — M1990 Unspecified osteoarthritis, unspecified site: Secondary | ICD-10-CM | POA: Insufficient documentation

## 2016-04-06 DIAGNOSIS — Z79899 Other long term (current) drug therapy: Secondary | ICD-10-CM | POA: Insufficient documentation

## 2016-04-06 MED ORDER — HYDROCODONE-ACETAMINOPHEN 5-325 MG PO TABS: 2.0000 | ORAL_TABLET | ORAL | Status: DC | PRN

## 2016-04-06 MED ORDER — OXYCODONE-ACETAMINOPHEN 5-325 MG PO TABS
2.0000 | ORAL_TABLET | Freq: Once | ORAL | Status: AC
  Administered 2016-04-06: 2 via ORAL
  Filled 2016-04-06: qty 2

## 2016-04-06 NOTE — ED Notes (Signed)
Pt c/o rib injury onset after fall last night secondary to ongoing intermittent Parkinsonian leg tremors. Pt reports striking right side on washing machine, then slid to floor, did not hit floor. No head injury or LOC. No anticoagulant or antiplatelet therapy.

## 2016-04-06 NOTE — ED Provider Notes (Signed)
CSN: [id number]     Arrival date & time 04/06/16  1037 History   First MD Initiated Contact with Patient 04/06/16 1312     Chief Complaint  Patient presents with  . Rib Injury    HPI Comments: 70 year old female who presents with right rib pain and back pain after a fall yesterday. PMH significant for Parkinson disease - currently uses a wheelchair, frequent syncopal episodes, memory loss. Patient states she got up out of her wheelchair yesterday to do laundry because she was feeling well. After she got up her legs started to shake and gave out on her. She fell on to her right side. She is unsure if she passed out or hit her head because of her memory loss. She is also reporting mid-back pain however she states that she has chronic back pain. Not currently on blood thinners. She had an MRI of her brain since she was having frequent syncopal episodes in February which was essentially normal. Denies head or neck pain, vision changes, dizziness, headache, vomiting, seizures, paresthesias   Past Medical History  Diagnosis Date  . Headache(784.0)   . GERD (gastroesophageal reflux disease)   . corticobasilar degeneration     sees Dr. Tonye Royalty at [facility]  . Osteoarthritis     has seen Dr Beola Cord  . Bipolar depression ([place])     had seen Dr. Candis Schatz in past   . Dementia   . Gallstones   . Neuromuscular disorder ([place])   . Hiatal hernia 07/27/2012    EGD  . Parkinson disease ([place])   . HH (hiatus hernia)    Past Surgical History  Procedure Laterality Date  . Tonsillectomy    . Cesarean section    . Cervical disc surgery  1994    c-6   . Wrist surgery  06-24-2010    Dr Rush Farmer right wrist  . Neck surgery      Lumpectomy  . Foot neuroma surgery    . Cholecystectomy     Family History  Problem Relation Age of Onset  . Emphysema Mother   . Heart attack Father   . Hypertension    . Lung cancer     Social History  Substance Use Topics  . Smoking status: Never Smoker   .  Smokeless tobacco: Never Used  . Alcohol Use: No   OB History    No data available     Review of Systems  Musculoskeletal: Positive for myalgias, back pain and gait problem.       Rib pain  Skin: Negative for wound.  Neurological: Negative for dizziness.      Allergies  Celebrex; Morphine and related; Penicillins; Risperdal; and Statins  Home Medications   Prior to Admission medications   Medication Sig Start Date End Date Taking? Authorizing Provider  AMBULATORY NON FORMULARY MEDICATION Domperidone 10 mg Please take one tablet by mouth four times daily before meals and at bedtime 03/12/16  Yes Jerene Bears, MD  Bisacodyl (LAXATIVE PO) Take 8.6 mg by mouth daily. Pt takes Senna laxatives   Yes Historical Provider, MD  buPROPion (WELLBUTRIN XL) 300 MG 24 hr tablet Take 300 mg by mouth daily.  03/27/16  Yes Historical Provider, MD  BUSPIRONE HCL PO Take 5 mg by mouth daily.   Yes Historical Provider, MD  Calcium 500 MG CHEW Chew 1 tablet by mouth daily.    Yes Historical Provider, MD  esomeprazole (NEXIUM) 40 MG capsule Take 1 capsule (40 mg  total) by mouth 2 (two) times daily before a meal. 03/26/16  Yes Jerene Bears, MD  Eszopiclone (ESZOPICLONE) 3 MG TABS Take once a day at bedtime 03/27/16  Yes Laurey Morale, MD  hyoscyamine (LEVSIN SL) 0.125 MG SL tablet Place 1 tablet (0.125 mg total) under the tongue every 6 (six) hours as needed for cramping. 10/07/15  Yes Jerene Bears, MD  ibuprofen (ADVIL,MOTRIN) 800 MG tablet TAKE 1 TABLET EVERY 8 HOURS AS NEEDED FOR PAIN 01/02/16  Yes Laurey Morale, MD  LamoTRIgine XR (LAMICTAL XR) 200 MG TB24 Take 1 tablet (200 mg total) by mouth daily. 12/15/13  Yes Laurey Morale, MD  Magnesium 500 MG CAPS Take 1 capsule by mouth daily.   Yes Historical Provider, MD  oxyCODONE-acetaminophen (PERCOCET/ROXICET) 5-325 MG tablet Take 1 tablet by mouth every 6 (six) hours as needed for severe pain. 12/25/15  Yes Laurey Morale, MD  promethazine (PHENERGAN) 25 MG  tablet Take 1 tablet (25 mg total) by mouth every 4 (four) hours as needed for nausea. 12/25/15  Yes Laurey Morale, MD  benzonatate (TESSALON) 100 MG capsule Take 1 capsule (100 mg total) by mouth 3 (three) times daily. Patient not taking: Reported on 04/06/2016 01/06/16   Delano Metz, FNP  doxycycline (VIBRA-TABS) 100 MG tablet Take 1 tablet (100 mg total) by mouth 2 (two) times daily. Patient not taking: Reported on 04/06/2016 01/06/16   Delano Metz, FNP  HYDROcodone-homatropine (HYDROMET) 5-1.5 MG/5ML syrup Take 5 mLs by mouth every 4 (four) hours as needed. Patient not taking: Reported on 04/06/2016 01/01/16   Laurey Morale, MD   BP 156/76 mmHg  Pulse 65  Temp(Src) 98.1 F (36.7 C) (Oral)  Resp 14  SpO2 99%   Physical Exam  Constitutional: She is oriented to person, place, and time. She appears well-developed and well-nourished. No distress.  HENT:  Head: Normocephalic and atraumatic. Head is without raccoon's eyes, without Battle's sign and without contusion.  Right Ear: No hemotympanum.  Left Ear: No hemotympanum.  Nose: No epistaxis.  Eyes: Conjunctivae are normal. Pupils are equal, round, and reactive to light. Right eye exhibits no discharge. Left eye exhibits no discharge. No scleral icterus.  Neck: Neck supple. No tracheal deviation present.  Minimal mid-line tenderness  Cardiovascular: Normal rate and regular rhythm.  Exam reveals no gallop.   No murmur heard. Pulmonary/Chest: Effort normal and breath sounds normal. No stridor. No respiratory distress. She has no wheezes. She has no rales. She exhibits tenderness.  Tenderness to palpation of R lower ribs  Musculoskeletal:  Inspection: No masses, deformity Palpation: Tenderness to palpation of T spine and L spine ROM: Deferred Gait: Pt is wheelchair bound   Neurological: She is alert and oriented to person, place, and time.  Patient has difficulty speaking  Skin: Skin is warm and dry.  Psychiatric: She has a normal  mood and affect. Her speech is delayed.    ED Course  Procedures (including critical care time) Labs Review Labs Reviewed - No data to display  Imaging Review Dg Chest 2 View  04/06/2016  CLINICAL DATA:  Initial encounter for Pt c/o rib injury onset after fall last night secondary to ongoing intermittent Parkinsonian leg tremors. Pt reports striking right side on washing machine, then slid to floor, did not hit floor. No head injury or LOC. EXAM: CHEST  2 VIEW COMPARISON:  01/03/2011 FINDINGS: Mild superior endplate compression deformity at the thoracolumbar junction is chronic. Midline trachea. Normal heart size  and mediastinal contours for age. No pleural effusion or pneumothorax. Mild volume loss at the left lung base laterally. Clear right lung. IMPRESSION: No acute cardiopulmonary disease. Electronically Signed   By: Abigail Miyamoto M.D.   On: 04/06/2016 11:37   Dg Thoracic Spine 2 View  04/06/2016  CLINICAL DATA:  Fall yesterday with back pain, initial encounter EXAM: THORACIC SPINE 2 VIEWS COMPARISON:  None. FINDINGS: Chronic L1 compression deformity is noted. The thoracic vertebra show no compression deformity. No paraspinal mass lesion is seen. Pedicles are within normal limits. IMPRESSION: Degenerative change without acute abnormality. Electronically Signed   By: Inez Catalina M.D.   On: 04/06/2016 14:54   Dg Lumbar Spine Complete  04/06/2016  CLINICAL DATA:  Status post fall yesterday.  Low back pain. EXAM: LUMBAR SPINE - COMPLETE 4+ VIEW COMPARISON:  None. FINDINGS: There are 5 nonrib bearing lumbar-type vertebral bodies. Chronic L1 vertebral body compression fracture with approximately 40% height loss. The remainder the vertebral body heights are maintained. The alignment is anatomic. There is no spondylolysis. There is no static listhesis. There is no acute fracture. The disc spaces are maintained. Bilateral facet arthropathy at L3-4, L4-5 and L5-S1. The SI joints are unremarkable.  IMPRESSION: No acute osseous injury of the lumbar spine. Chronic L1 vertebral body compression fracture. Electronically Signed   By: Kathreen Devoid   On: 04/06/2016 14:42   I have personally reviewed and evaluated these images and lab results as part of my medical decision-making.   EKG Interpretation None      MDM   Final diagnoses:  Fall, initial encounter  Rib pain on right side  Midline low back pain without sciatica   70 year old female presents with MSK pain after a fall. Unsure if her back pain is acute since she has chronic pain. Xrays obtained show chronic L1 compression fracture and is otherwise unremarkable. She denies severe HA, vomiting, seizures, and is not on blood thinners. MOI was low impact. PE was reassuring in that there was no neuro deficit, signs of skull fracture (Battle sign, Raccoon eyes, nasal CSF leak), no hemotympanum, or septal hematoma. Recommend Ibuprofen for pain and small amount of narcotics for pain. Patient advised to follow up with her PCP. Pt states that her leg weakness is chronic. Will not obtain labs at this time. Shared visit with Dr. Tyrone Nine.   CT of head and neck are pending. Signed patient out to [place]. If CT and head and neck are negative, anticipate d/c home with pain medicine and PCP follow up.  Recardo Evangelist, PA-C 04/07/16 [place], DO 04/07/16 [phone]

## 2016-04-22 ENCOUNTER — Ambulatory Visit (INDEPENDENT_AMBULATORY_CARE_PROVIDER_SITE_OTHER): Payer: Medicare Other | Admitting: Family Medicine

## 2016-04-22 ENCOUNTER — Encounter: Payer: Self-pay | Admitting: Family Medicine

## 2016-04-22 VITALS — BP 127/82 | HR 73 | Temp 98.3°F

## 2016-04-22 DIAGNOSIS — M545 Low back pain, unspecified: Secondary | ICD-10-CM

## 2016-04-22 DIAGNOSIS — M546 Pain in thoracic spine: Secondary | ICD-10-CM | POA: Diagnosis not present

## 2016-04-22 DIAGNOSIS — S20211D Contusion of right front wall of thorax, subsequent encounter: Secondary | ICD-10-CM

## 2016-04-22 NOTE — Progress Notes (Signed)
Pre visit review using our clinic review tool, if applicable. No additional management support is needed unless otherwise documented below in the visit note. Pt unable to stand and weigh.   

## 2016-04-22 NOTE — Progress Notes (Signed)
   Subjective:    Patient ID: Audrey Brown, female    DOB: 1946-01-25, 70 y.o.   MRN: [id number]  HPI Here to follow up an ER visit on 04-06-16 after a fall at home. On 04-05-16 she attempted to stand up out of her wheelchair to do some laundry, but she did not hold onto anything the way she usually does. Her legs buckled under her and she fell on her left side. No head trauma and no LOC. She had pain in the right ribs and throughout the back. At the ER she had negative Xrays of the chest and the spine, as well as negative CT scans of the cervical spine and the head. She was felt to have contusions only and was sent home. Since then she has been in moderate pain in the ribs and the back pain has settled down a bit. She is using 800 mg Ibuprofen along with some occasional Percocet for the pain. No SOB. No unusual neurologic symptoms.    Review of Systems  Respiratory: Negative.   Cardiovascular: Positive for chest pain. Negative for palpitations and leg swelling.  Neurological: Positive for tremors and weakness. Negative for dizziness, seizures, syncope, facial asymmetry, light-headedness, numbness and headaches.       Objective:   Physical Exam  Constitutional: She is oriented to person, place, and time. She appears well-developed and well-nourished.  In her wheelchair   HENT:  Head: Normocephalic and atraumatic.  Cardiovascular: Normal rate, regular rhythm, normal heart sounds and intact distal pulses.   Pulmonary/Chest: Effort normal and breath sounds normal. No respiratory distress. She has no wheezes. She has no rales.  Tender over the right lower anterior ribs, no crepitus   Musculoskeletal:  Tender along the thoracic and lumbar spines, ROM is somewhat reduced   Neurological: She is alert and oriented to person, place, and time.          Assessment & Plan:  She sustained contusions to the right ribs and to the back during a fall. She should recover well over the next 3-4  weeks. She can use Ibuprofen and Percocet as needed.   Laurey Morale, MD

## 2016-04-27 ENCOUNTER — Telehealth: Payer: Self-pay | Admitting: Family Medicine

## 2016-05-16 ENCOUNTER — Emergency Department (HOSPITAL_COMMUNITY): Payer: Medicare Other

## 2016-05-16 ENCOUNTER — Encounter (HOSPITAL_COMMUNITY): Payer: Self-pay | Admitting: Emergency Medicine

## 2016-05-16 ENCOUNTER — Emergency Department (HOSPITAL_COMMUNITY)
Admission: EM | Admit: 2016-05-16 | Discharge: 2016-05-17 | Disposition: A | Discharge status: Home / Self Care | Payer: Medicare Other | Attending: Emergency Medicine | Admitting: Emergency Medicine

## 2016-05-16 DIAGNOSIS — R079 Chest pain, unspecified: Secondary | ICD-10-CM

## 2016-05-16 DIAGNOSIS — M1990 Unspecified osteoarthritis, unspecified site: Secondary | ICD-10-CM | POA: Insufficient documentation

## 2016-05-16 DIAGNOSIS — R0789 Other chest pain: Secondary | ICD-10-CM | POA: Diagnosis not present

## 2016-05-16 DIAGNOSIS — F329 Major depressive disorder, single episode, unspecified: Secondary | ICD-10-CM | POA: Insufficient documentation

## 2016-05-16 DIAGNOSIS — J189 Pneumonia, unspecified organism: Secondary | ICD-10-CM | POA: Diagnosis not present

## 2016-05-16 DIAGNOSIS — R0602 Shortness of breath: Secondary | ICD-10-CM | POA: Diagnosis not present

## 2016-05-16 LAB — TROPONIN I: Troponin I: <0.03 ng/mL (ref <0.03)

## 2016-05-16 MED ORDER — OXYCODONE-ACETAMINOPHEN 5-325 MG PO TABS
1.0000 | ORAL_TABLET | Freq: Once | ORAL | Status: AC
  Administered 2016-05-16: 1 via ORAL
  Filled 2016-05-16: qty 1

## 2016-05-16 MED ORDER — LEVOFLOXACIN 750 MG PO TABS
750.0000 mg | ORAL_TABLET | Freq: Once | ORAL | Status: AC
  Administered 2016-05-16: 750 mg via ORAL
  Filled 2016-05-16: qty 1

## 2016-05-16 MED ORDER — METHOCARBAMOL 500 MG PO TABS: 500.0000 mg | ORAL_TABLET | Freq: Two times a day (BID) | ORAL | Status: DC

## 2016-05-16 MED ORDER — DIAZEPAM 5 MG PO TABS
5.0000 mg | ORAL_TABLET | Freq: Once | ORAL | Status: AC
  Administered 2016-05-16: 5 mg via ORAL
  Filled 2016-05-16: qty 1

## 2016-05-16 MED ORDER — LEVOFLOXACIN 750 MG PO TABS: 750.0000 mg | ORAL_TABLET | Freq: Every day | ORAL | Status: DC

## 2016-05-16 MED ORDER — HYDROCODONE-ACETAMINOPHEN 5-325 MG PO TABS: 2.0000 | ORAL_TABLET | ORAL | Status: DC | PRN

## 2016-05-16 NOTE — ED Notes (Signed)
Bed: WA19 Expected date:  Expected time:  Means of arrival:  Comments: EMS 

## 2016-05-16 NOTE — ED Provider Notes (Signed)
CSN: [id number]     Arrival date & time 05/16/16  2117 History   First MD Initiated Contact with Patient 05/16/16 2141     Chief Complaint  Patient presents with  . Chest Pain     (Consider location/radiation/quality/duration/timing/severity/associated sxs/prior Treatment) HPI Comments: Patient here with sudden onset of left-sided chest pain which occurred while she was crocheting. Pain is localized to the left anterior chest and worse with moving and characterized as a sharp pressure. No associated diaphoresis or dyspnea. No nausea vomiting. Pain is also worse when she moves her arms. Pain is better with remaining still. No recent cough or congestion. Took aspirin prior to arrival. [facility] and was transported here  Patient is a 70 y.o. female presenting with chest pain. The history is provided by the patient and a relative.  Chest Pain   Past Medical History  Diagnosis Date  . Headache(784.0)   . GERD (gastroesophageal reflux disease)   . corticobasilar degeneration     sees Dr. Tonye Royalty at [facility]  . Osteoarthritis     has seen Dr Beola Cord  . Bipolar depression ([place])     had seen Dr. Candis Schatz in past   . Dementia   . Gallstones   . Neuromuscular disorder ([place])   . Hiatal hernia 07/27/2012    EGD  . Parkinson disease ([place])   . HH (hiatus hernia)    Past Surgical History  Procedure Laterality Date  . Tonsillectomy    . Cesarean section    . Cervical disc surgery  1994    c-6   . Wrist surgery  06-24-2010    Dr Rush Farmer right wrist  . Neck surgery      Lumpectomy  . Foot neuroma surgery    . Cholecystectomy     Family History  Problem Relation Age of Onset  . Emphysema Mother   . Heart attack Father   . Hypertension    . Lung cancer     Social History  Substance Use Topics  . Smoking status: Never Smoker   . Smokeless tobacco: Never Used  . Alcohol Use: No   OB History    No data available     Review of Systems  Cardiovascular: Positive for  chest pain.  All other systems reviewed and are negative.     Allergies  Celebrex; Morphine and related; Penicillins; Risperdal; and Statins  Home Medications   Prior to Admission medications   Medication Sig Start Date End Date Taking? Authorizing Provider  AMBULATORY NON FORMULARY MEDICATION Domperidone 10 mg Please take one tablet by mouth four times daily before meals and at bedtime 03/12/16   Jerene Bears, MD  Bisacodyl (LAXATIVE PO) Take 8.6 mg by mouth daily. Pt takes Senna laxatives    Historical Provider, MD  buPROPion (WELLBUTRIN XL) 300 MG 24 hr tablet Take 300 mg by mouth daily.  03/27/16   Historical Provider, MD  BUSPIRONE HCL PO Take 5 mg by mouth daily.    Historical Provider, MD  Calcium 500 MG CHEW Chew 1 tablet by mouth daily.     Historical Provider, MD  esomeprazole (NEXIUM) 40 MG capsule Take 1 capsule (40 mg total) by mouth 2 (two) times daily before a meal. 03/26/16   Jerene Bears, MD  Eszopiclone (ESZOPICLONE) 3 MG TABS Take once a day at bedtime 03/27/16   Laurey Morale, MD  HYDROcodone-acetaminophen (NORCO/VICODIN) 5-325 MG tablet Take 2 tablets by mouth every 4 (four) hours as needed.  04/06/16   Recardo Evangelist, PA-C  hyoscyamine (LEVSIN SL) 0.125 MG SL tablet Place 1 tablet (0.125 mg total) under the tongue every 6 (six) hours as needed for cramping. 10/07/15   Jerene Bears, MD  ibuprofen (ADVIL,MOTRIN) 800 MG tablet TAKE 1 TABLET EVERY 8 HOURS AS NEEDED FOR PAIN 01/02/16   Laurey Morale, MD  LamoTRIgine XR (LAMICTAL XR) 200 MG TB24 Take 1 tablet (200 mg total) by mouth daily. 12/15/13   Laurey Morale, MD  Magnesium 500 MG CAPS Take 1 capsule by mouth daily.    Historical Provider, MD  oxyCODONE-acetaminophen (PERCOCET/ROXICET) 5-325 MG tablet Take 1 tablet by mouth every 6 (six) hours as needed for severe pain. Patient not taking: Reported on 04/22/2016 12/25/15   Laurey Morale, MD  promethazine (PHENERGAN) 25 MG tablet Take 1 tablet (25 mg total) by mouth every 4  (four) hours as needed for nausea. 12/25/15   Laurey Morale, MD   BP 191/86 mmHg  Pulse 79  Temp(Src) 97.4 F (36.3 C) (Oral)  Resp 22  SpO2 98% Physical Exam  Constitutional: She is oriented to person, place, and time. She appears well-developed and well-nourished.  Non-toxic appearance. No distress.  HENT:  Head: Normocephalic and atraumatic.  Eyes: Conjunctivae, EOM and lids are normal. Pupils are equal, round, and reactive to light.  Neck: Normal range of motion. Neck supple. No tracheal deviation present. No thyroid mass present.  Cardiovascular: Normal rate, regular rhythm and normal heart sounds.  Exam reveals no gallop.   No murmur heard. Pulmonary/Chest: Effort normal and breath sounds normal. No stridor. No respiratory distress. She has no decreased breath sounds. She has no wheezes. She has no rhonchi. She has no rales. She exhibits bony tenderness.    Abdominal: Soft. Normal appearance and bowel sounds are normal. She exhibits no distension. There is no tenderness. There is no rebound and no CVA tenderness.  Musculoskeletal: Normal range of motion. She exhibits no edema or tenderness.  Neurological: She is alert and oriented to person, place, and time. She has normal strength. No cranial nerve deficit or sensory deficit. GCS eye subscore is 4. GCS verbal subscore is 5. GCS motor subscore is 6.  Skin: Skin is warm and dry. No abrasion and no rash noted.  Psychiatric: She has a normal mood and affect. Her speech is normal and behavior is normal.  Nursing note and vitals reviewed.   ED Course  Procedures (including critical care time) Labs Review Labs Reviewed - No data to display  Imaging Review No results found. I have personally reviewed and evaluated these images and lab results as part of my medical decision-making.   EKG Interpretation   Date/Time:  Saturday May 16 2016 21:33:39 EDT Ventricular Rate:  85 PR Interval:    QRS Duration: 95 QT Interval:  393 QTC  Calculation: 468 R Axis:   24 Text Interpretation:  Sinus rhythm Anterior infarct, old Artifact in  lead(s) I II aVR aVL aVF V1 Confirmed by Yavuz Kirby  MD, Modestine Scherzinger ([id number]) on  05/16/2016 11:28:46 PM      MDM   Final diagnoses:  None  Patient endorses recent cough or congestion but denies any dyspnea. Patient medicated for her chest wall pain troponin negative. Chest x-ray however did show evidence of a left lower lobe pneumonia. Do not think that the patient has ACS or PE. Given first dose of Levaquin here and will be placed on course of same medication. Also treat patient for  her chest wall pain.    Lacretia Leigh, MD 05/16/16 2329

## 2016-05-16 NOTE — Discharge Instructions (Signed)
Chest Wall Pain Chest wall pain is pain in or around the bones and muscles of your chest. Sometimes, an injury causes this pain. Sometimes, the cause may not be known. This pain may take several weeks or longer to get better. HOME CARE INSTRUCTIONS  Pay attention to any changes in your symptoms. Take these actions to help with your pain:   Rest as told by your health care provider.   Avoid activities that cause pain. These include any activities that use your chest muscles or your abdominal and side muscles to lift heavy items.   If directed, apply ice to the painful area:  Put ice in a plastic bag.  Place a towel between your skin and the bag.  Leave the ice on for 20 minutes, 2-3 times per day.  Take over-the-counter and prescription medicines only as told by your health care provider.  Do not use tobacco products, including cigarettes, chewing tobacco, and e-cigarettes. If you need help quitting, ask your health care provider.  Keep all follow-up visits as told by your health care provider. This is important. SEEK MEDICAL CARE IF:  You have a fever.  Your chest pain becomes worse.  You have new symptoms. SEEK IMMEDIATE MEDICAL CARE IF:  You have nausea or vomiting.  You feel sweaty or light-headed.  You have a cough with phlegm (sputum) or you cough up blood.  You develop shortness of breath.   This information is not intended to replace advice given to you by your health care provider. Make sure you discuss any questions you have with your health care provider.   Document Released: 10/26/2005 Document Revised: 07/17/2015 Document Reviewed: 01/21/2015 Elsevier Interactive Patient Education 2016 [place] Pneumonia, Adult Pneumonia is an infection of the lungs. There are different types of pneumonia. One type can develop while a person is in a hospital. A different type, called community-acquired pneumonia, develops in people who are not, or  have not recently been, in the hospital or other health care facility.  CAUSES Pneumonia may be caused by bacteria, viruses, or funguses. Community-acquired pneumonia is often caused by Streptococcus pneumonia bacteria. These bacteria are often passed from one person to another by breathing in droplets from the cough or sneeze of an infected person. RISK FACTORS The condition is more likely to develop in:  People who havechronic diseases, such as chronic obstructive pulmonary disease (COPD), asthma, congestive heart failure, cystic fibrosis, diabetes, or kidney disease.  People who haveearly-stage or late-stage HIV.  People who havesickle cell disease.  People who havehad their spleen removed (splenectomy).  People who havepoor Human resources officer.  People who havemedical conditions that increase the risk of breathing in (aspirating) secretions their own mouth and nose.   People who havea weakened immune system (immunocompromised).  People who smoke.  People whotravel to areas where pneumonia-causing germs commonly exist.  People whoare around animal habitats or animals that have pneumonia-causing germs, including birds, bats, rabbits, cats, and farm animals. SYMPTOMS Symptoms of this condition include:  Adry cough.  A wet (productive) cough.  Fever.  Sweating.  Chest pain, especially when breathing deeply or coughing.  Rapid breathing or difficulty breathing.  Shortness of breath.  Shaking chills.  Fatigue.  Muscle aches. DIAGNOSIS Your health care provider will take a medical history and perform a physical exam. You may also have other tests, including:  Imaging studies of your chest, including X-rays.  Tests to check your blood oxygen level and other blood gases.  Other tests  on blood, mucus (sputum), fluid around your lungs (pleural fluid), and urine. If your pneumonia is severe, other tests may be done to identify the specific cause of your  illness. TREATMENT The type of treatment that you receive depends on many factors, such as the cause of your pneumonia, the medicines you take, and other medical conditions that you have. For most adults, treatment and recovery from pneumonia may occur at home. In some cases, treatment must happen in a hospital. Treatment may include:  Antibiotic medicines, if the pneumonia was caused by bacteria.  Antiviral medicines, if the pneumonia was caused by a virus.  Medicines that are given by mouth or through an IV tube.  Oxygen.  Respiratory therapy. Although rare, treating severe pneumonia may include:  Mechanical ventilation. This is done if you are not breathing well on your own and you cannot maintain a safe blood oxygen level.  Thoracentesis. This procedureremoves fluid around one lung or both lungs to help you breathe better. HOME CARE INSTRUCTIONS  Take over-the-counter and prescription medicines only as told by your health care provider.  Only takecough medicine if you are losing sleep. Understand that cough medicine can prevent your body's natural ability to remove mucus from your lungs.  If you were prescribed an antibiotic medicine, take it as told by your health care provider. Do not stop taking the antibiotic even if you start to feel better.  Sleep in a semi-upright position at night. Try sleeping in a reclining chair, or place a few pillows under your head.  Do not use tobacco products, including cigarettes, chewing tobacco, and e-cigarettes. If you need help quitting, ask your health care provider.  Drink enough water to keep your urine clear or pale yellow. This will help to thin out mucus secretions in your lungs. PREVENTION There are ways that you can decrease your risk of developing community-acquired pneumonia. Consider getting a pneumococcal vaccine if:  You are older than 68 years of age.  You are older than 68 years of age and are undergoing cancer treatment,  have chronic lung disease, or have other medical conditions that affect your immune system. Ask your health care provider if this applies to you. There are different types and schedules of pneumococcal vaccines. Ask your health care provider which vaccination option is best for you. You may also prevent community-acquired pneumonia if you take these actions:  Get an influenza vaccine every year. Ask your health care provider which type of influenza vaccine is best for you.  Go to the dentist on a regular basis.  Wash your hands often. Use hand sanitizer if soap and water are not available. SEEK MEDICAL CARE IF:  You have a fever.  You are losing sleep because you cannot control your cough with cough medicine. SEEK IMMEDIATE MEDICAL CARE IF:  You have worsening shortness of breath.  You have increased chest pain.  Your sickness becomes worse, especially if you are an older adult or have a weakened immune system.  You cough up blood.   This information is not intended to replace advice given to you by your health care provider. Make sure you discuss any questions you have with your health care provider.   Document Released: 10/26/2005 Document Revised: 07/17/2015 Document Reviewed: 02/20/2015 Elsevier Interactive Patient Education [employer].

## 2016-05-16 NOTE — ED Notes (Signed)
Pt BIB EMS. Pt states she developed mid sternal chest pain around 2000 while crocheting. She denies radiation, nausea, diaphoresis. She reports that she fractured some ribs about a month ago. She took 325 mg ASA PTA. Pt is very anxious per EMS. Pt alert, no acute distress. Skin warm, dry.

## 2016-05-18 ENCOUNTER — Ambulatory Visit: Payer: Self-pay

## 2016-05-21 ENCOUNTER — Ambulatory Visit (INDEPENDENT_AMBULATORY_CARE_PROVIDER_SITE_OTHER): Payer: Medicare Other | Admitting: Internal Medicine

## 2016-05-21 ENCOUNTER — Encounter: Payer: Self-pay | Admitting: Internal Medicine

## 2016-05-21 VITALS — BP 114/70 | HR 80

## 2016-05-21 DIAGNOSIS — K589 Irritable bowel syndrome without diarrhea: Secondary | ICD-10-CM | POA: Diagnosis not present

## 2016-05-21 DIAGNOSIS — K219 Gastro-esophageal reflux disease without esophagitis: Secondary | ICD-10-CM

## 2016-05-21 DIAGNOSIS — R1310 Dysphagia, unspecified: Secondary | ICD-10-CM

## 2016-05-21 DIAGNOSIS — K3184 Gastroparesis: Secondary | ICD-10-CM

## 2016-05-21 MED ORDER — PROMETHAZINE HCL 25 MG PO TABS: 25.0000 mg | ORAL_TABLET | ORAL | Status: DC | PRN

## 2016-05-21 MED ORDER — AMBULATORY NON FORMULARY MEDICATION: Status: DC

## 2016-05-21 MED ORDER — ESOMEPRAZOLE MAGNESIUM 40 MG PO CPDR: 40.0000 mg | DELAYED_RELEASE_CAPSULE | Freq: Two times a day (BID) | ORAL | Status: DC

## 2016-05-21 MED ORDER — HYOSCYAMINE SULFATE 0.125 MG SL SUBL: 0.1250 mg | SUBLINGUAL_TABLET | Freq: Four times a day (QID) | SUBLINGUAL | Status: DC | PRN

## 2016-05-21 NOTE — Patient Instructions (Addendum)
You have been scheduled for an endoscopy. Please follow written instructions given to you at your visit today. If you use inhalers (even only as needed), please bring them with you on the day of your procedure. Your physician has requested that you go to www.startemmi.com and enter the access code given to you at your visit today. This web site gives a general overview about your procedure. However, you should still follow specific instructions given to you by our office regarding your preparation for the procedure.  We have sent the following medications to your pharmacy for you to pick up at your convenience: Nexium Levsin  Discontinue Senna.  We have sent domperidone refills to [facility].  If you are age 27 or older, your body mass index should be between 23-30. Your There is no weight on file to calculate BMI. If this is out of the aforementioned range listed, please consider follow up with your Primary Care Provider.  If you are age 9 or younger, your body mass index should be between 19-25. Your There is no weight on file to calculate BMI. If this is out of the aformentioned range listed, please consider follow up with your Primary Care Provider.

## 2016-05-21 NOTE — Progress Notes (Signed)
Subjective:    Patient ID: Audrey Brown, female    DOB: 10-11-1946, 70 y.o.   MRN: [id number]  HPI Audrey Brown is a  70 year old female with a past medical history of GERD , IBS , gastroparesis and significant Parkinson's disease who is here for follow-up. She was last seen in June 2016. She is here today with her daughter. She reports that she recently fell and fractured a rib. This is caused significant pain. She also developed cough and shortness of breath and 4 days ago was seen in the emergency department and diagnosed with a left lower lung pneumonia. She has been on Levaquin and symptoms have been improving.   From a GI perspective she has maintained domperidone 10 mg 4 times a day. She reports this has helped significantly with abdominal cramping and nausea. It also seems to help with coughing, gagging sensation and bloating.   She will without this medication for a few weeks and had troublesome symptoms resolving when restarting the medication. She continued Nexium 40 mg twice daily. She attempted to go to once daily but had more coughing,  Heartburn and regurgitation symptoms. She does have ongoing solid food dysphagia worse with foods such as meats and breads. This was evaluated last summer with barium esophagram which was largely unremarkable. She did have dilation several years ago performed by Dr. Sharlett Iles which helped her dysphagia. She is interested in this again. She's had loose stools recently particularly after eating. She is taking senna on a daily basis. She is also on magnesium supplementation.   She had a Cologuard in late 2015 which she reports was negative  Review of Systems  as per history of present illness, otherwise negative  Current Medications, Allergies, Past Medical History, Past Surgical History, Family History and Social History were reviewed in [employer] record.     Objective:   Physical Exam BP 114/70 mmHg  Pulse 80  Ht    Wt  Constitutional: Well-developed and well-nourished. No distress. HEENT: Normocephalic and atraumatic. Oropharynx is clear and moist. No oropharyngeal exudate. Conjunctivae are normal.  No scleral icterus. Neck: Neck supple. Trachea midline. Cardiovascular: Normal rate, regular rhythm and intact distal pulses. No M/R/G Pulmonary/chest: Effort normal and breath sounds normal. No wheezing, rales or rhonchi. Abdominal: Soft, nontender, nondistended. Bowel sounds active throughout. There are no masses palpable. No hepatosplenomegaly. Extremities: no clubbing, cyanosis, or edema Lymphadenopathy: No cervical adenopathy noted. Neurological: Alert and oriented to person place and time. Skin: Skin is warm and dry. No rashes noted. Psychiatric: Normal mood and affect. Behavior is normal.  ESOPHOGRAM / BARIUM SWALLOW / BARIUM TABLET STUDY   TECHNIQUE: Combined double contrast and single contrast examination performed using effervescent crystals, thick barium liquid, and thin barium liquid. The patient was observed with fluoroscopy swallowing a 13 mm barium sulphate tablet.   FLUOROSCOPY TIME:  Radiation Exposure Index (as provided by the fluoroscopic device):   If the device does not provide the exposure index:   Fluoroscopy Time:  1 minutes, 20 seconds   Number of Acquired Images:  36   COMPARISON:  None.   FINDINGS: Laryngeal penetration nearly to the cords noted on both swallows, with somewhat delayed epiglottic inversion. Slightly prominent cricopharyngeus. Solid interbody fusion at C6-7.   The laryngeal penetration elicited a cough which cleared barium coating.   Primary peristaltic waves in the esophagus were intact on 4 out of 4 swallows. No esophageal narrowing. No significant hiatal hernia was observed during today' s  exam. On once for a well there were some transient secondary contractions as shown on series 31.   IMPRESSION: 1. Laryngeal penetration, eliciting a cough.  No frank tracheal aspiration was observed. 2. No esophageal narrowing.  Primary peristaltic waves were intact.     Electronically Signed   By: Van Clines M.D.   On: 05/02/2015 10:37     Assessment & Plan:  70 year old female with a past medical history of GERD , IBS , gastroparesis and significant Parkinson's disease who is here for follow-up.  1. GERD and esophageal dysphagia --  Long-standing gastroesophageal reflux disease improved with Nexium. We tried to dose this daily but symptoms returned.  I feel that this medication is medically necessary given her reflux. Likely a component of esophageal dysmotility in the setting of Parkinson's disease. She does have some pharyngeal dysphagia also related to Parkinson's. We discussed the long-term risks, benefits and alternatives to PPI including recent data suggesting renal dysfunction. We discussed bone health and and PPI use.   The present though she and I feel this medication should be continued. Continue Nexium 40 mg twice a day before meals. For the dysphagia we discussed upper endoscopy for dilation as it has helped in the past. After discussion the risks, benefits and alternatives she wishes to proceed. This test will be scheduled at least one month out and after time for resolution of pneumonia. Hospital setting with monitored anesthesia care   2. Gastroparesis  With IBS--  Improved with domperidone. Continue domperidone 10 mg before meals and at bedtime. She can use Levsin on an as-needed basis for abdominal cramping and spasm.   3. Loose stools --  More of a problem for her recently however she is taking senna. Stop senna and monitor for response.   4. CRC screening --  Negative Cologuard late 2015, repeat Cologuard [place] 2018  25 minutes spent with the patient today. Greater than 50% was spent in counseling and coordination of care with the patient

## 2016-05-27 ENCOUNTER — Telehealth: Payer: Self-pay | Admitting: Internal Medicine

## 2016-05-27 ENCOUNTER — Ambulatory Visit
Admission: RE | Admit: 2016-05-27 | Discharge: 2016-05-27 | Disposition: A | Discharge status: Home / Self Care | Payer: Medicare Other | Source: Ambulatory Visit

## 2016-05-27 DIAGNOSIS — Z1231 Encounter for screening mammogram for malignant neoplasm of breast: Secondary | ICD-10-CM | POA: Diagnosis not present

## 2016-05-27 MED ORDER — HYOSCYAMINE SULFATE 0.125 MG SL SUBL: 0.1250 mg | SUBLINGUAL_TABLET | Freq: Four times a day (QID) | SUBLINGUAL | Status: DC | PRN

## 2016-05-27 MED ORDER — PROMETHAZINE HCL 25 MG PO TABS: 25.0000 mg | ORAL_TABLET | ORAL | Status: DC | PRN

## 2016-05-27 MED ORDER — ESOMEPRAZOLE MAGNESIUM 40 MG PO CPDR: 40.0000 mg | DELAYED_RELEASE_CAPSULE | Freq: Two times a day (BID) | ORAL | Status: DC

## 2016-05-27 NOTE — Telephone Encounter (Signed)
Rx sent to [facility]

## 2016-05-29 ENCOUNTER — Telehealth: Payer: Self-pay | Admitting: Internal Medicine

## 2016-05-29 NOTE — Telephone Encounter (Signed)
I advised patient that I have placed request on Dr Vena Rua desk and am waiting on his response. She verbalizes understanding.

## 2016-06-01 ENCOUNTER — Encounter: Payer: Self-pay | Admitting: Internal Medicine

## 2016-06-01 MED ORDER — HYOSCYAMINE SULFATE 0.125 MG SL SUBL: 0.1250 mg | SUBLINGUAL_TABLET | Freq: Four times a day (QID) | SUBLINGUAL | 0 refills | Status: DC | PRN

## 2016-06-01 NOTE — Telephone Encounter (Signed)
Per Dr Hilarie Fredrickson, okay to give oscimin SL in place of hyoscyamine. New rx sent to [facility].

## 2016-06-01 NOTE — Addendum Note (Signed)
Addended by: Larina Bras on: 06/01/2016 04:17 PM   Modules accepted: Orders

## 2016-06-03 ENCOUNTER — Ambulatory Visit (INDEPENDENT_AMBULATORY_CARE_PROVIDER_SITE_OTHER)
Admission: RE | Admit: 2016-06-03 | Discharge: 2016-06-03 | Disposition: A | Discharge status: Home / Self Care | Payer: Medicare Other | Source: Ambulatory Visit | Attending: Family Medicine | Admitting: Family Medicine

## 2016-06-03 ENCOUNTER — Ambulatory Visit (INDEPENDENT_AMBULATORY_CARE_PROVIDER_SITE_OTHER): Payer: Medicare Other | Admitting: Family Medicine

## 2016-06-03 VITALS — BP 117/82 | HR 77 | Temp 98.4°F

## 2016-06-03 DIAGNOSIS — J189 Pneumonia, unspecified organism: Secondary | ICD-10-CM

## 2016-06-03 NOTE — Progress Notes (Signed)
Pre visit review using our clinic review tool, if applicable. No additional management support is needed unless otherwise documented below in the visit note. Pt unable to weigh 

## 2016-06-03 NOTE — Progress Notes (Signed)
   Subjective:    Patient ID: Audrey Brown, female    DOB: 05-03-1946, 70 y.o.   MRN: [id number]  HPI Here to follow up on a visit to the ER on 05-16-16 for a left sided chest pain. Her cardiac workup was normal but a CXR revealed a LLL pneumonia. She was given a 10 day course of Levaquin , and the pain went away. Now she feels fine no fever or SOB.    Review of Systems  Constitutional: Negative.   HENT: Negative.   Respiratory: Negative.   Cardiovascular: Positive for chest pain. Negative for palpitations and leg swelling.       Objective:   Physical Exam  Constitutional: She is oriented to person, place, and time. She appears well-developed and well-nourished. No distress.  HENT:  Right Ear: External ear normal.  Left Ear: External ear normal.  Nose: Nose normal.  Mouth/Throat: Oropharynx is clear and moist.  Eyes: Conjunctivae are normal.  Neck: No thyromegaly present.  Cardiovascular: Normal rate, regular rhythm, normal heart sounds and intact distal pulses.   Pulmonary/Chest: Effort normal and breath sounds normal. No respiratory distress. She has no wheezes. She has no rales.  Lymphadenopathy:    She has no cervical adenopathy.  Neurological: She is alert and oriented to person, place, and time.          Assessment & Plan:  LLL pneumonia, treated with Levaquin. She will get a follow up CXR today/.  Laurey Morale, MD

## 2016-06-04 ENCOUNTER — Telehealth: Payer: Self-pay | Admitting: Internal Medicine

## 2016-06-04 NOTE — Telephone Encounter (Signed)
Patient states that she received a letter last month from [place] stating that her dr office need to provide a letter of medical necessity for her to continue receiving Nexium. [facility] never sent any correspondence to our office about this. Patient states that she got a bill in the mail yesterday for $49 for her Nexium. She says she cannot pay this price. I contacted [facility] (phone [phone]) and spoke to [place]. She indicates that patient's formulary changed last month and they now need a prior authorization for the medication as well as necessity information. Per new formulary guidelines, patient must have tried and failed omeprazole, aciphex AND pantoprazole prior to Nexium approval. Patient has tried aciphex and omprazole with failed results. However, she states that she does not recall if she has taken pantoprazole before since she has been on Nexium for such a long period of time. I advised [name] of this. Patient does have multiple chronic medical issues that a change in medication could effect though. With that information, [name] was able to get an approval for the Nexium from 05/05/16- lifetime of the plan. [id number]. She was able to get medication cost down to $20.00 as well. Case: [id number] effective 05/05/16-lifetime of plan. I have attempted to have [facility] run the medication back through for the cheaper amount. However, they state that the patient must first pay the invoice for $49 and then submit a reimbursement form to them. Patient has been advised of this and she verbalizes understanding.

## 2016-06-18 ENCOUNTER — Encounter (HOSPITAL_COMMUNITY): Payer: Self-pay | Admitting: *Deleted

## 2016-06-23 ENCOUNTER — Ambulatory Visit (HOSPITAL_COMMUNITY): Payer: Medicare Other | Admitting: Certified Registered Nurse Anesthetist

## 2016-06-23 ENCOUNTER — Encounter (HOSPITAL_COMMUNITY): Payer: Self-pay | Admitting: Anesthesiology

## 2016-06-23 ENCOUNTER — Ambulatory Visit (HOSPITAL_COMMUNITY)
Admission: RE | Admit: 2016-06-23 | Discharge: 2016-06-23 | Disposition: A | Discharge status: Home / Self Care | Payer: Medicare Other | Source: Ambulatory Visit | Attending: Internal Medicine | Admitting: Internal Medicine

## 2016-06-23 ENCOUNTER — Encounter (HOSPITAL_COMMUNITY)
Admission: RE | Disposition: A | Discharge status: Home / Self Care | Payer: Self-pay | Source: Ambulatory Visit | Attending: Internal Medicine

## 2016-06-23 DIAGNOSIS — F329 Major depressive disorder, single episode, unspecified: Secondary | ICD-10-CM | POA: Diagnosis not present

## 2016-06-23 DIAGNOSIS — R1310 Dysphagia, unspecified: Secondary | ICD-10-CM | POA: Insufficient documentation

## 2016-06-23 DIAGNOSIS — G20 Parkinson's disease: Secondary | ICD-10-CM | POA: Diagnosis not present

## 2016-06-23 DIAGNOSIS — K219 Gastro-esophageal reflux disease without esophagitis: Secondary | ICD-10-CM | POA: Insufficient documentation

## 2016-06-23 DIAGNOSIS — K3184 Gastroparesis: Secondary | ICD-10-CM

## 2016-06-23 DIAGNOSIS — K449 Diaphragmatic hernia without obstruction or gangrene: Secondary | ICD-10-CM | POA: Insufficient documentation

## 2016-06-23 DIAGNOSIS — Z6830 Body mass index (BMI) 30.0-30.9, adult: Secondary | ICD-10-CM | POA: Insufficient documentation

## 2016-06-23 DIAGNOSIS — E669 Obesity, unspecified: Secondary | ICD-10-CM | POA: Diagnosis not present

## 2016-06-23 DIAGNOSIS — F0390 Unspecified dementia without behavioral disturbance: Secondary | ICD-10-CM | POA: Insufficient documentation

## 2016-06-23 DIAGNOSIS — Z9049 Acquired absence of other specified parts of digestive tract: Secondary | ICD-10-CM | POA: Insufficient documentation

## 2016-06-23 DIAGNOSIS — K259 Gastric ulcer, unspecified as acute or chronic, without hemorrhage or perforation: Secondary | ICD-10-CM | POA: Diagnosis not present

## 2016-06-23 HISTORY — PX: ESOPHAGOGASTRODUODENOSCOPY (EGD) WITH PROPOFOL: SHX5813

## 2016-06-23 HISTORY — PX: SAVORY DILATION: SHX5439

## 2016-06-23 SURGERY — ESOPHAGOGASTRODUODENOSCOPY (EGD) WITH PROPOFOL
Anesthesia: Monitor Anesthesia Care

## 2016-06-23 MED ORDER — ONDANSETRON HCL 4 MG/2ML IJ SOLN
INTRAMUSCULAR | Status: AC
  Filled 2016-06-23: qty 2

## 2016-06-23 MED ORDER — PROPOFOL 10 MG/ML IV BOLUS
INTRAVENOUS | Status: DC | PRN
  Administered 2016-06-23 (×2): 20 mg via INTRAVENOUS

## 2016-06-23 MED ORDER — LACTATED RINGERS IV SOLN
INTRAVENOUS | Status: DC
  Administered 2016-06-23: 09:00:00 via INTRAVENOUS

## 2016-06-23 MED ORDER — SODIUM CHLORIDE 0.9 % IV SOLN: INTRAVENOUS | Status: DC

## 2016-06-23 MED ORDER — BUTAMBEN-TETRACAINE-BENZOCAINE 2-2-14 % EX AERO
INHALATION_SPRAY | CUTANEOUS | Status: DC | PRN
  Administered 2016-06-23: 2 via TOPICAL

## 2016-06-23 MED ORDER — ONDANSETRON HCL 4 MG/2ML IJ SOLN
INTRAMUSCULAR | Status: DC | PRN
  Administered 2016-06-23: 4 mg via INTRAVENOUS

## 2016-06-23 MED ORDER — PROPOFOL 500 MG/50ML IV EMUL
INTRAVENOUS | Status: DC | PRN
  Administered 2016-06-23: 50 ug/kg/min via INTRAVENOUS

## 2016-06-23 MED ORDER — PROPOFOL 10 MG/ML IV BOLUS
INTRAVENOUS | Status: AC
  Filled 2016-06-23: qty 40

## 2016-06-23 MED ORDER — LIDOCAINE HCL (CARDIAC) 20 MG/ML IV SOLN
INTRAVENOUS | Status: AC
  Filled 2016-06-23: qty 5

## 2016-06-23 MED ORDER — LIDOCAINE HCL (CARDIAC) 20 MG/ML IV SOLN
INTRAVENOUS | Status: DC | PRN
  Administered 2016-06-23: 100 mg via INTRAVENOUS

## 2016-06-23 SURGICAL SUPPLY — 14 items

## 2016-06-23 NOTE — Anesthesia Postprocedure Evaluation (Signed)
Anesthesia Post Note  Patient: LYBERTI AFSHAR  Procedure(s) Performed: Procedure(s) (LRB): ESOPHAGOGASTRODUODENOSCOPY (EGD) WITH PROPOFOL (N/A) SAVORY DILATION (N/A)  Patient location during evaluation: PACU Anesthesia Type: MAC Level of consciousness: awake and alert Pain management: pain level controlled Vital Signs Assessment: post-procedure vital signs reviewed and stable Respiratory status: spontaneous breathing, nonlabored ventilation and respiratory function stable Cardiovascular status: stable and blood pressure returned to baseline Postop Assessment: no signs of nausea or vomiting Anesthetic complications: no    Last Vitals:  Vitals:   06/23/16 0834 06/23/16 1007  BP: 121/83 (!) 152/45  Pulse: 69 74  Resp: 14 15  Temp: 36.7 C     Last Pain:  Vitals:   06/23/16 0834  TempSrc: Oral                 Makaylia Hewett A.

## 2016-06-23 NOTE — H&P (Signed)
HPI: Audrey Brown is a 70 year old female with GERD, dysphagia, gastroparesis, IBS and Parkinson's who presents for outpatient upper endoscopy to further evaluate dysphagia. She is here today with her daughter. Procedure scheduled with monitored anesthesia care. She continues to have solid food dysphagia and this has even extended to soft foods. Previously evaluated by barium esophagram last summer which was largely unremarkable. Dr. Sharlett Iles had previously performed dilation which she felt helped. She continues her Nexium and domperidone  Past Medical History:  Diagnosis Date  . Bipolar depression ([place])    had seen Dr. Candis Schatz in past   . corticobasilar degeneration    sees Dr. Tonye Royalty at [facility]  . Dementia   . Gallstones   . GERD (gastroesophageal reflux disease)   . Headache(784.0)   . HH (hiatus hernia)   . Hiatal hernia 07/27/2012   EGD  . Neuromuscular disorder ([place])   . Osteoarthritis    has seen Dr Beola Cord  . Parkinson disease ([place])   . Pneumonia 2 weeks ago    Past Surgical History:  Procedure Laterality Date  . [place]   c-6   . CESAREAN SECTION     x 1  . CHOLECYSTECTOMY    . FOOT NEUROMA SURGERY    . NECK SURGERY     Lumpectomy  . TONSILLECTOMY    . WRIST SURGERY  06-24-2010   Dr Rush Farmer right wrist     (Not in an outpatient encounter)  Allergies  Allergen Reactions  . Celebrex [Celecoxib]     REACTION: hives  . Morphine And Related Hives  . Penicillins Hives and Nausea Only    Has patient had a PCN reaction causing immediate rash, facial/tongue/throat swelling, SOB or lightheadedness with hypotension:Yes Has patient had a PCN reaction causing severe rash involving mucus membranes or skin necrosis:unsure Has patient had a PCN reaction that required hospitalization Yes already in the hospital  Has patient had a PCN reaction occurring within the last 10 years: Yes If all of the above answers are "NO", then may proceed  with Cephalosporin use.   Marland Kitchen Risperdal [Risperidone]     Severe tremors  . Statins Other (See Comments)    Muscle aches     Family History  Problem Relation Age of Onset  . Emphysema Mother   . Heart attack Father   . Hypertension    . Lung cancer      Social History  Substance Use Topics  . Smoking status: Never Smoker  . Smokeless tobacco: Never Used  . Alcohol use No    ROS: As per history of present illness, otherwise negative  BP 121/83   Pulse 69   Temp 98 F (36.7 C) (Oral)   Resp 14   Ht 5\' 3"  (1.6 m)   Wt 170 lb (77.1 kg)   SpO2 97%   BMI 30.11 kg/m  Gen: awake, alert, chronically ill appearing, NAD HEENT: anicteric, op clear CV: RRR, no mrg Pulm: CTA b/l Abd: soft, NT/ND, +BS throughout Ext: no c/c/e    RELEVANT LABS AND IMAGING: CBC    Component Value Date/Time   WBC 7.0 12/25/2015 0936   RBC 4.77 12/25/2015 0936   HGB 14.2 12/25/2015 0936   HCT 41.3 12/25/2015 0936   PLT 300.0 12/25/2015 0936   MCV 86.6 12/25/2015 0936   MCH 30.0 04/10/2013 1400   MCHC 34.3 12/25/2015 0936   RDW 13.3 12/25/2015 0936   LYMPHSABS 2.4 12/25/2015 0936   MONOABS 0.5  12/25/2015 0936   EOSABS 0.3 12/25/2015 0936   BASOSABS 0.1 12/25/2015 0936    CMP     Component Value Date/Time   NA 145 12/25/2015 0936   K 4.8 12/25/2015 0936   CL 109 12/25/2015 0936   CO2 29 12/25/2015 0936   GLUCOSE 97 12/25/2015 0936   BUN 11 12/25/2015 0936   CREATININE 0.66 12/25/2015 0936   CALCIUM 9.5 12/25/2015 0936   PROT 6.9 12/25/2015 0936   ALBUMIN 4.2 12/25/2015 0936   AST 13 12/25/2015 0936   ALT 12 12/25/2015 0936   ALKPHOS 56 12/25/2015 0936   BILITOT 0.5 12/25/2015 0936   GFRNONAA >90 04/10/2013 1400   GFRAA >90 04/10/2013 1400    ASSESSMENT/PLAN: 70 year old female with GERD, dysphagia, gastroparesis, IBS and Parkinson's who presents for outpatient upper endoscopy to further evaluate dysphagia.  1. Dysphagia, hx of GERD -- There may be a primary  motility/oropharyngeal component to her dysphagia given her Parkinson's. She remembers favorable response to prior esophageal dilatation. Plan upper endoscopy today for direct visualization and possible dilation. The nature of the procedure, as well as the risks, benefits, and alternatives were carefully and thoroughly reviewed with the patient. Ample time for discussion and questions allowed. The patient understood, was satisfied, and agreed to proceed.

## 2016-06-23 NOTE — Anesthesia Preprocedure Evaluation (Addendum)
Anesthesia Evaluation  Patient identified by MRN, date of birth, ID band Patient awake    Reviewed: Allergy & Precautions, NPO status , Patient's Chart, lab work & pertinent test results  Airway Mallampati: III  TM Distance: >3 FB Neck ROM: Full    Dental  (+) Caps, Missing   Pulmonary pneumonia,  Hx/o Pneumonia 2 weeks ago   Pulmonary exam normal breath sounds clear to auscultation       Cardiovascular Normal cardiovascular exam Rhythm:Regular Rate:Normal     Neuro/Psych  Headaches, PSYCHIATRIC DISORDERS Depression Bipolar Disorder Dementia Parkinson's Disease Corticobasilar degeneration  Neuromuscular disease    GI/Hepatic Neg liver ROS, hiatal hernia, GERD  Medicated and Controlled,Dysphagia   Endo/Other  Obesity  Renal/GU negative Renal ROS  negative genitourinary   Musculoskeletal  (+) Arthritis , 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   Abdominal (+) + obese,   Peds  Hematology   Anesthesia Other Findings   Reproductive/Obstetrics                            Anesthesia Physical Anesthesia Plan  ASA: III  Anesthesia Plan: MAC   Post-op Pain Management:    Induction:   Airway Management Planned: Natural Airway, Nasal Cannula and Simple Face Mask  Additional Equipment:   Intra-op Plan:   Post-operative Plan:   Informed Consent: I have reviewed the patients History and Physical, chart, labs and discussed the procedure including the risks, benefits and alternatives for the proposed anesthesia with the patient or authorized representative who has indicated his/her understanding and acceptance.     Plan Discussed with: Anesthesiologist, CRNA and Surgeon  Anesthesia Plan Comments:         Anesthesia Quick Evaluation

## 2016-06-23 NOTE — Transfer of Care (Signed)
Immediate Anesthesia Transfer of Care Note  Patient: Audrey Brown  Procedure(s) Performed: Procedure(s): ESOPHAGOGASTRODUODENOSCOPY (EGD) WITH PROPOFOL (N/A) SAVORY DILATION (N/A)  Patient Location: PACU  Anesthesia Type:MAC  Level of Consciousness:  sedated, patient cooperative and responds to stimulation  Airway & Oxygen Therapy:Patient Spontanous Breathing and Patient connected to face mask oxgen  Post-op Assessment:  Report given to PACU RN and Post -op Vital signs reviewed and stable  Post vital signs:  Reviewed and stable  Last Vitals:  Vitals:   06/23/16 0834  BP: 121/83  Pulse: 69  Resp: 14  Temp: 123XX123 C    Complications: No apparent anesthesia complications

## 2016-06-23 NOTE — Anesthesia Procedure Notes (Signed)
Procedure Name: MAC Date/Time: 06/23/2016 9:35 AM Performed by: West Pugh Pre-anesthesia Checklist: Patient identified, Timeout performed, Emergency Drugs available, Suction available and Patient being monitored Patient Re-evaluated:Patient Re-evaluated prior to inductionOxygen Delivery Method: Nasal cannula Placement Confirmation: positive ETCO2 Dental Injury: Teeth and Oropharynx as per pre-operative assessment

## 2016-06-23 NOTE — Op Note (Signed)
[facility] Patient Name: Audrey Brown Procedure Date: 06/23/2016 MRN: [id number] Attending MD: Jerene Bears , MD Date of Birth: 1946-05-31 CSN: [id number] Age: 70 Admit Type: Outpatient Procedure:                Upper GI endoscopy Indications:              Dysphagia, Gastro-esophageal reflux disease Providers:                Lajuan Lines. Hilarie Fredrickson, MD, Carolynn Comment, RN, Ralene Bathe, Technician, Williemae Area, CRNA Referring MD:              Medicines:                Monitored Anesthesia Care Complications:            No immediate complications. Estimated Blood Loss:     Estimated blood loss: none. Procedure:                Pre-Anesthesia Assessment:                           - Prior to the procedure, a History and Physical                            was performed, and patient medications and                            allergies were reviewed. The patient's tolerance of                            previous anesthesia was also reviewed. The risks                            and benefits of the procedure and the sedation                            options and risks were discussed with the patient.                            All questions were answered, and informed consent                            was obtained. Prior Anticoagulants: The patient has                            taken no previous anticoagulant or antiplatelet                            agents. ASA Grade Assessment: III - A patient with                            severe systemic disease. After reviewing the risks  and benefits, the patient was deemed in                            satisfactory condition to undergo the procedure.                           After obtaining informed consent, the endoscope was                            passed under direct vision. Throughout the                            procedure, the patient's blood pressure, pulse, and                   oxygen saturations were monitored continuously. The                            [id number]) scope was introduced through the                            mouth, and advanced to the second part of duodenum.                            The upper GI endoscopy was accomplished without                            difficulty. The patient tolerated the procedure                            well. Scope In: Scope Out: Findings:      Normal mucosa was found in the entire esophagus.      A 4 cm hiatal hernia was present with small and superficial Cameron's       erosion of the diaphragmatic hiatus.      A TTS dilator was passed through the scope. Given response to previous       dilation, empiric dilation with a 15-16.5-18 mm balloon dilator was       performed to 18 mm in the lower third of the esophagus and across the GE       junction.      The entire examined stomach was normal.      The cardia and gastric fundus were otherwise normal on retroflexion       (hiatal hernia described above).      The examined duodenum was normal. Impression:               - Normal mucosa was found in the entire esophagus.                           - 4 cm hiatal hernia with small Cameron's erosion.                           - Normal stomach.                           - Normal examined duodenum.                           -  Dilation performed in the lower third of the                            esophagus. Moderate Sedation:      N/A Recommendation:           - Patient has a contact number available for                            emergencies. The signs and symptoms of potential                            delayed complications were discussed with the                            patient. Return to normal activities tomorrow.                            Written discharge instructions were provided to the                            patient.                           - Soft diet.                            - Continue present medications.                           - Refer to speech and swallow therapy for                            evaluation of swallowing/oropharyngeal dysphagia. Procedure Code(s):        --- Professional ---                           [phone], Esophagogastroduodenoscopy, flexible,                            transoral; with transendoscopic balloon dilation of                            esophagus (less than 30 mm diameter) Diagnosis Code(s):        --- Professional ---                           K44.9, Diaphragmatic hernia without obstruction or                            gangrene                           R13.10, Dysphagia, unspecified                           K21.9, Gastro-esophageal reflux disease without  esophagitis CPT copyright 2016 American Medical Association. All rights reserved. The codes documented in this report are preliminary and upon coder review may  be revised to meet current compliance requirements. Jerene Bears, MD 06/23/2016 10:06:58 AM This report has been signed electronically. Number of Addenda: 0

## 2016-06-23 NOTE — Discharge Instructions (Signed)

## 2016-06-25 ENCOUNTER — Encounter (HOSPITAL_COMMUNITY): Payer: Self-pay | Admitting: Internal Medicine

## 2016-06-30 ENCOUNTER — Other Ambulatory Visit: Payer: Self-pay | Admitting: Family Medicine

## 2016-06-30 NOTE — Telephone Encounter (Signed)
Can we refill this? Looks like its contraindicated.

## 2016-07-03 ENCOUNTER — Other Ambulatory Visit: Payer: Self-pay | Admitting: Internal Medicine

## 2016-07-03 MED ORDER — AMBULATORY NON FORMULARY MEDICATION: 4 refills | Status: DC

## 2016-07-03 NOTE — Telephone Encounter (Signed)
Prescription faxed to pharmacy in [place]. Patient notified.

## 2016-07-07 ENCOUNTER — Other Ambulatory Visit: Payer: Self-pay

## 2016-07-07 DIAGNOSIS — R1310 Dysphagia, unspecified: Secondary | ICD-10-CM

## 2016-07-09 ENCOUNTER — Telehealth: Payer: Self-pay

## 2016-07-09 ENCOUNTER — Other Ambulatory Visit: Payer: Self-pay | Admitting: Internal Medicine

## 2016-07-09 DIAGNOSIS — R1310 Dysphagia, unspecified: Secondary | ICD-10-CM

## 2016-07-09 NOTE — Telephone Encounter (Signed)
Pt scheduled for modified barium swallow at [facility] 07/16/16@11 :30am. Pt to arrive in radiology at 11:15am. Left message for pt to call back.  Spoke with pt and she is aware of appt.

## 2016-07-16 ENCOUNTER — Ambulatory Visit (HOSPITAL_COMMUNITY)
Admission: RE | Admit: 2016-07-16 | Discharge: 2016-07-16 | Disposition: A | Discharge status: Home / Self Care | Payer: Medicare Other | Source: Ambulatory Visit | Attending: Internal Medicine | Admitting: Internal Medicine

## 2016-07-16 DIAGNOSIS — R1311 Dysphagia, oral phase: Secondary | ICD-10-CM | POA: Insufficient documentation

## 2016-07-16 DIAGNOSIS — R1313 Dysphagia, pharyngeal phase: Secondary | ICD-10-CM | POA: Insufficient documentation

## 2016-07-16 DIAGNOSIS — K449 Diaphragmatic hernia without obstruction or gangrene: Secondary | ICD-10-CM | POA: Insufficient documentation

## 2016-07-16 DIAGNOSIS — F0390 Unspecified dementia without behavioral disturbance: Secondary | ICD-10-CM | POA: Insufficient documentation

## 2016-07-16 DIAGNOSIS — R1319 Other dysphagia: Secondary | ICD-10-CM | POA: Diagnosis not present

## 2016-07-16 DIAGNOSIS — F319 Bipolar disorder, unspecified: Secondary | ICD-10-CM | POA: Insufficient documentation

## 2016-07-16 DIAGNOSIS — K219 Gastro-esophageal reflux disease without esophagitis: Secondary | ICD-10-CM | POA: Insufficient documentation

## 2016-07-16 DIAGNOSIS — R1310 Dysphagia, unspecified: Secondary | ICD-10-CM | POA: Diagnosis not present

## 2016-07-16 DIAGNOSIS — G20 Parkinson's disease: Secondary | ICD-10-CM | POA: Diagnosis not present

## 2016-07-16 NOTE — Progress Notes (Signed)
MBSS complete. Full report located under chart review in imaging section.  Conley Pawling Paiewonsky, M.A. CCC-SLP [phone]  

## 2016-08-03 ENCOUNTER — Ambulatory Visit (INDEPENDENT_AMBULATORY_CARE_PROVIDER_SITE_OTHER): Payer: Medicare Other | Admitting: Family Medicine

## 2016-08-03 VITALS — BP 120/87 | HR 74 | Temp 98.2°F

## 2016-08-03 DIAGNOSIS — M1990 Unspecified osteoarthritis, unspecified site: Secondary | ICD-10-CM

## 2016-08-03 MED ORDER — METHYLPREDNISOLONE 4 MG PO TBPK: ORAL_TABLET | ORAL | 0 refills | Status: DC

## 2016-08-03 NOTE — Progress Notes (Signed)
Pre visit review using our clinic review tool, if applicable. No additional management support is needed unless otherwise documented below in the visit note. Pt unable to stand and weigh.   

## 2016-08-04 ENCOUNTER — Encounter: Payer: Self-pay | Admitting: Family Medicine

## 2016-08-04 LAB — RHEUMATOID FACTOR

## 2016-08-04 LAB — SEDIMENTATION RATE: SED RATE: 13 mm/h (ref 0–30)

## 2016-08-04 LAB — URIC ACID: Uric Acid, Serum: 4.9 mg/dL (ref 2.4–7.0)

## 2016-08-04 NOTE — Progress Notes (Signed)
   Subjective:    Patient ID: Audrey Brown, female    DOB: 1946/01/15, 70 y.o.   MRN: [id number]  HPI Here with her daughter for swelling and pain in the 3rd MCP joint on the right hand that suddenly appeared one week ago. No recent trauma. This has never happened before. She has some osteoarthritis in the knees and spine but she has never had a joint swell up like this before. No fevers. Using ice packs and Ibuprofen.    Review of Systems  Constitutional: Negative.   Musculoskeletal: Positive for arthralgias and joint swelling.  Skin: Negative for rash.       Objective:   Physical Exam  Constitutional: She is oriented to person, place, and time. She appears well-developed and well-nourished.  Musculoskeletal:  The 3rd MCP on the right hand is red, swollen, warm, and quite tender. ROM is limited by pain.   Neurological: She is alert and oriented to person, place, and time.          Assessment & Plan:  She has an acutely inflamed joint that is suspicious for gout. She is given a Medrol dose pack to reduce the inflammation. Get labs today including ESR, uric acid, and RF.  Laurey Morale, MD

## 2016-08-11 ENCOUNTER — Telehealth: Payer: Self-pay | Admitting: Family Medicine

## 2016-08-11 MED ORDER — METHYLPREDNISOLONE 4 MG PO TBPK: ORAL_TABLET | ORAL | 0 refills | Status: DC

## 2016-08-11 NOTE — Telephone Encounter (Signed)
I spoke with pt and sent script e-scribe to [facility]. 

## 2016-08-11 NOTE — Telephone Encounter (Signed)
Call in another Medrol dose pack. If this does not solve the issue, have her see [place] again to reassess

## 2016-08-11 NOTE — Telephone Encounter (Signed)
Pt was seen on 9-25 for hand swelling pt finished medication and now her hand has started swelling again. Please advise

## 2016-08-13 ENCOUNTER — Other Ambulatory Visit: Payer: Self-pay | Admitting: Internal Medicine

## 2016-08-25 ENCOUNTER — Other Ambulatory Visit: Payer: Self-pay | Admitting: Internal Medicine

## 2016-08-31 ENCOUNTER — Ambulatory Visit (INDEPENDENT_AMBULATORY_CARE_PROVIDER_SITE_OTHER): Payer: Medicare Other | Admitting: Family Medicine

## 2016-08-31 VITALS — BP 125/73 | HR 80 | Temp 98.2°F

## 2016-08-31 DIAGNOSIS — M79641 Pain in right hand: Secondary | ICD-10-CM

## 2016-09-01 ENCOUNTER — Encounter: Payer: Self-pay | Admitting: Family Medicine

## 2016-09-01 NOTE — Progress Notes (Signed)
   Subjective:    Patient ID: Audrey Brown, female    DOB: [birth year], 70 y.o.   MRN: [id number]  HPI Here to follow up pain and swelling in the 3rd MCP joint of the right hand. She was seen for this on 08-03-16 and was given a Medrol dose pack. This was very helpful but some swelling and pain remains. She tries not to use the hand too much.    Review of Systems  Constitutional: Negative.   Respiratory: Negative.   Cardiovascular: Negative.   Musculoskeletal: Positive for arthralgias and joint swelling.       Objective:   Physical Exam  Constitutional: She appears well-developed and well-nourished.  Cardiovascular: Normal rate, regular rhythm, normal heart sounds and intact distal pulses.   Pulmonary/Chest: Effort normal and breath sounds normal.  Musculoskeletal:  Right 3rd MCP is mildly swollen and tender, but no erythema or warmth. ROM is limited          Assessment & Plan:  MCP pain, refer to Hand Surgery.  Laurey Morale, MD

## 2016-09-18 DIAGNOSIS — M66241 Spontaneous rupture of extensor tendons, right hand: Secondary | ICD-10-CM | POA: Diagnosis not present

## 2016-09-18 DIAGNOSIS — M79641 Pain in right hand: Secondary | ICD-10-CM | POA: Diagnosis not present

## 2016-09-21 ENCOUNTER — Other Ambulatory Visit: Payer: Self-pay | Admitting: Orthopedic Surgery

## 2016-09-22 ENCOUNTER — Other Ambulatory Visit: Payer: Self-pay | Admitting: Orthopedic Surgery

## 2016-09-22 DIAGNOSIS — M66241 Spontaneous rupture of extensor tendons, right hand: Secondary | ICD-10-CM

## 2016-09-25 ENCOUNTER — Ambulatory Visit
Admission: RE | Admit: 2016-09-25 | Discharge: 2016-09-25 | Disposition: A | Discharge status: Home / Self Care | Payer: Medicare Other | Source: Ambulatory Visit | Attending: Orthopedic Surgery | Admitting: Orthopedic Surgery

## 2016-09-25 DIAGNOSIS — M66241 Spontaneous rupture of extensor tendons, right hand: Secondary | ICD-10-CM

## 2016-09-25 DIAGNOSIS — M7989 Other specified soft tissue disorders: Secondary | ICD-10-CM | POA: Diagnosis not present

## 2016-09-28 DIAGNOSIS — M66241 Spontaneous rupture of extensor tendons, right hand: Secondary | ICD-10-CM | POA: Diagnosis not present

## 2016-09-29 ENCOUNTER — Other Ambulatory Visit: Payer: Self-pay | Admitting: Orthopedic Surgery

## 2016-09-29 DIAGNOSIS — M66241 Spontaneous rupture of extensor tendons, right hand: Secondary | ICD-10-CM

## 2016-10-13 ENCOUNTER — Ambulatory Visit
Admission: RE | Admit: 2016-10-13 | Discharge: 2016-10-13 | Disposition: A | Discharge status: Home / Self Care | Payer: Medicare Other | Source: Ambulatory Visit | Attending: Orthopedic Surgery | Admitting: Orthopedic Surgery

## 2016-10-13 DIAGNOSIS — M79641 Pain in right hand: Secondary | ICD-10-CM | POA: Diagnosis not present

## 2016-10-13 DIAGNOSIS — M66241 Spontaneous rupture of extensor tendons, right hand: Secondary | ICD-10-CM

## 2016-10-13 DIAGNOSIS — M7989 Other specified soft tissue disorders: Secondary | ICD-10-CM | POA: Diagnosis not present

## 2016-10-14 ENCOUNTER — Encounter (HOSPITAL_COMMUNITY): Payer: Self-pay | Admitting: Emergency Medicine

## 2016-10-14 ENCOUNTER — Emergency Department (HOSPITAL_COMMUNITY): Payer: Medicare Other

## 2016-10-14 ENCOUNTER — Emergency Department (HOSPITAL_COMMUNITY)
Admission: EM | Admit: 2016-10-14 | Discharge: 2016-10-14 | Disposition: A | Discharge status: Home / Self Care | Payer: Medicare Other | Attending: Emergency Medicine | Admitting: Emergency Medicine

## 2016-10-14 DIAGNOSIS — Y939 Activity, unspecified: Secondary | ICD-10-CM | POA: Insufficient documentation

## 2016-10-14 DIAGNOSIS — S20212A Contusion of left front wall of thorax, initial encounter: Secondary | ICD-10-CM | POA: Diagnosis not present

## 2016-10-14 DIAGNOSIS — S299XXA Unspecified injury of thorax, initial encounter: Secondary | ICD-10-CM | POA: Diagnosis not present

## 2016-10-14 DIAGNOSIS — X509XXA Other and unspecified overexertion or strenuous movements or postures, initial encounter: Secondary | ICD-10-CM | POA: Diagnosis not present

## 2016-10-14 DIAGNOSIS — G20 Parkinson's disease: Secondary | ICD-10-CM | POA: Diagnosis not present

## 2016-10-14 DIAGNOSIS — Y929 Unspecified place or not applicable: Secondary | ICD-10-CM | POA: Diagnosis not present

## 2016-10-14 DIAGNOSIS — Z79899 Other long term (current) drug therapy: Secondary | ICD-10-CM | POA: Insufficient documentation

## 2016-10-14 DIAGNOSIS — Y999 Unspecified external cause status: Secondary | ICD-10-CM | POA: Diagnosis not present

## 2016-10-14 DIAGNOSIS — R0781 Pleurodynia: Secondary | ICD-10-CM | POA: Diagnosis not present

## 2016-10-14 MED ORDER — HYDROMORPHONE HCL 1 MG/ML IJ SOLN
0.5000 mg | Freq: Once | INTRAMUSCULAR | Status: AC
  Administered 2016-10-14: 0.5 mg via INTRAMUSCULAR

## 2016-10-14 MED ORDER — KETOROLAC TROMETHAMINE 60 MG/2ML IM SOLN
60.0000 mg | Freq: Once | INTRAMUSCULAR | Status: AC
  Administered 2016-10-14: 60 mg via INTRAMUSCULAR
  Filled 2016-10-14: qty 2

## 2016-10-14 MED ORDER — HYDROCODONE-ACETAMINOPHEN 5-325 MG PO TABS: 1.0000 | ORAL_TABLET | ORAL | 0 refills | Status: DC | PRN

## 2016-10-14 MED ORDER — CYCLOBENZAPRINE HCL 5 MG PO TABS: 5.0000 mg | ORAL_TABLET | Freq: Three times a day (TID) | ORAL | 0 refills | Status: DC | PRN

## 2016-10-14 MED ORDER — HYDROMORPHONE HCL 1 MG/ML IJ SOLN
0.5000 mg | Freq: Once | INTRAMUSCULAR | Status: DC
  Filled 2016-10-14: qty 1

## 2016-10-14 NOTE — ED Notes (Signed)
Rib pain. No difficultly breathing

## 2016-10-14 NOTE — ED Provider Notes (Signed)
[facility] DEPT Provider Note   CSN: [id number] Arrival date & time: 10/14/16  1124     History   Chief Complaint Chief Complaint  Patient presents with  . rib cage pain    HPI Audrey Brown is a 70 y.o. female.  HPI 70 year old female with past medical history as below who presents with severe left chest wall pain. The patient states she was in her usual state of health until approximately 10 AM. She bent down to pick something up with her left arm and experienced an acute popping sensation in her left chest wall. She reports immediate onset of severe, sharp, stabbing left chest wall pain that is worse with movement as well as deep inspiration. She has history of recurrent rib fractures with similar symptoms. She has also had a mild, nonproductive cough with no hemoptysis. Denies any lower extremity swelling. Denies any falls or direct trauma. No fevers or chills.  Past Medical History:  Diagnosis Date  . Bipolar depression ([place])    had seen Dr. Candis Schatz in past   . corticobasilar degeneration    sees Dr. Tonye Royalty at [facility]  . Dementia   . Gallstones   . GERD (gastroesophageal reflux disease)   . Headache(784.0)   . HH (hiatus hernia)   . Hiatal hernia 07/27/2012   EGD  . Neuromuscular disorder ([place])   . Osteoarthritis    has seen Dr Beola Cord  . Parkinson disease ([place])   . Pneumonia 2 weeks ago    Patient Active Problem List   Diagnosis Date Noted  . Chronic mid back pain 08/14/2014  . 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 Abnormality of gait 04/10/2014  . Lumbosacral spondylosis without myelopathy 04/10/2014  . Dysphagia 06/16/2011  . GERD with stricture 06/16/2011  . Bipolar 1 disorder, mixed ([place]) 06/16/2011  . Parkinsonian syndrome ([place]) 06/16/2011  . COMPRESSION FRACTURE, LUMBAR VERTEBRAE 11/26/2010  . PARKINSONISM 11/13/2010  . TINEA CORPORIS 07/31/2010  . CELLULITIS, LEGS 07/31/2010  . RESTING TREMOR 07/31/2010  . DEPENDENT  EDEMA 06/25/2009  . GERD 11/19/2008  . WEAKNESS 11/19/2008  . Depression 01/02/2008  . HEADACHE 01/02/2008  . CHICKENPOX, HX OF 01/02/2008    Past Surgical History:  Procedure Laterality Date  . [place]   c-6   . CESAREAN SECTION     x 1  . CHOLECYSTECTOMY    . ESOPHAGOGASTRODUODENOSCOPY (EGD) WITH PROPOFOL N/A 06/23/2016   Procedure: ESOPHAGOGASTRODUODENOSCOPY (EGD) WITH PROPOFOL;  Surgeon: Jerene Bears, MD;  Location: [facility];  Service: Gastroenterology;  Laterality: N/A;  . FOOT NEUROMA SURGERY    . NECK SURGERY     Lumpectomy  . SAVORY DILATION N/A 06/23/2016   Procedure: SAVORY DILATION;  Surgeon: Jerene Bears, MD;  Location: [facility];  Service: Gastroenterology;  Laterality: N/A;  . TONSILLECTOMY    . WRIST SURGERY  06-24-2010   Dr Rush Farmer right wrist    OB History    No data available       Home Medications    Prior to Admission medications   Medication Sig Start Date End Date Taking? Authorizing Provider  AMBULATORY NON FORMULARY MEDICATION Domperidone 10 mg Please take one tablet by mouth four times daily before meals and at bedtime 07/03/16   Jerene Bears, MD  buPROPion (WELLBUTRIN XL) 300 MG 24 hr tablet Take 300 mg by mouth daily.  03/27/16   Historical Provider, MD  busPIRone (BUSPAR) 5 MG tablet Take 5 mg by mouth  3 (three) times daily. 03/27/16   Historical Provider, MD  Calcium 500 MG CHEW Chew 500 mg by mouth daily after lunch.     Historical Provider, MD  cyclobenzaprine (FLEXERIL) 5 MG tablet Take 1 tablet (5 mg total) by mouth 3 (three) times daily as needed for muscle spasms. 10/14/16   Duffy Bruce, MD  Eszopiclone (ESZOPICLONE) 3 MG TABS Take once a day at bedtime Patient taking differently: Take 3 mg by mouth at bedtime as needed (sleep).  03/27/16   Laurey Morale, MD  HYDROcodone-acetaminophen (NORCO/VICODIN) 5-325 MG tablet Take 1-2 tablets by mouth every 4 (four) hours as needed for moderate pain or severe pain. 10/14/16    Duffy Bruce, MD  ibuprofen (ADVIL,MOTRIN) 800 MG tablet TAKE 1 TABLET EVERY 8 HOURS AS NEEDED FOR PAIN 06/30/16   Laurey Morale, MD  LamoTRIgine XR (LAMICTAL XR) 200 MG TB24 Take 1 tablet (200 mg total) by mouth daily. 12/15/13   Laurey Morale, MD  Magnesium 500 MG CAPS Take 500 mg by mouth daily.     Historical Provider, MD  methylPREDNISolone (MEDROL DOSEPAK) 4 MG TBPK tablet As directed 08/03/16   Laurey Morale, MD  methylPREDNISolone (MEDROL DOSEPAK) 4 MG TBPK tablet Take as directed 08/11/16   Laurey Morale, MD  NEXIUM 40 MG capsule TAKE 1 CAPSULE TWICE A DAY BEFORE MEALS 08/25/16   Jerene Bears, MD  OSCIMIN 0.125 MG SUBL PLACE 1 TABLET UNDER THE TONGUE EVERY 6 HOURS AS NEEDED FOR CRAMPING 08/13/16   Jerene Bears, MD  oxyCODONE-acetaminophen (PERCOCET) 10-325 MG tablet Take 1 tablet by mouth every 4 (four) hours as needed for pain.    Historical Provider, MD  promethazine (PHENERGAN) 25 MG tablet Take 1 tablet (25 mg total) by mouth every 4 (four) hours as needed for nausea. 05/27/16   Jerene Bears, MD    Family History Family History  Problem Relation Age of Onset  . Emphysema Mother   . Heart attack Father   . Hypertension    . Lung cancer      Social History Social History  Substance Use Topics  . Smoking status: Never Smoker  . Smokeless tobacco: Never Used  . Alcohol use No     Allergies   Celebrex [celecoxib]; Morphine and related; Penicillins; Risperdal [risperidone]; and Statins   Review of Systems Review of Systems  Constitutional: Negative for chills and fever.  HENT: Negative for congestion, rhinorrhea and sore throat.   Eyes: Negative for visual disturbance.  Respiratory: Positive for cough and shortness of breath. Negative for choking and wheezing.   Cardiovascular: Positive for chest pain. Negative for leg swelling.  Gastrointestinal: Negative for abdominal pain, diarrhea, nausea and vomiting.  Genitourinary: Negative for dysuria, flank pain, vaginal bleeding  and vaginal discharge.  Musculoskeletal: Negative for neck pain.  Skin: Negative for rash.  Allergic/Immunologic: Negative for immunocompromised state.  Neurological: Negative for syncope and headaches.  Hematological: Does not bruise/bleed easily.  All other systems reviewed and are negative.    Physical Exam Updated Vital Signs BP 142/76   Pulse 72   Temp 98.4 F (36.9 C)   Resp 16   SpO2 97%   Physical Exam  Constitutional: She is oriented to person, place, and time. She appears well-developed and well-nourished. No distress.  HENT:  Head: Normocephalic and atraumatic.  Eyes: Conjunctivae are normal.  Neck: Neck supple.  Cardiovascular: Normal rate, regular rhythm and normal heart sounds.  Exam reveals no friction rub.  No murmur heard. Pulmonary/Chest: Effort normal and breath sounds normal. No respiratory distress. She has no wheezes. She has no rales. She exhibits tenderness (Exquisite tenderness to palpation over left lower anterior rib cage. No bruising or deformity. Chest wall excursion is symmetric.).  Abdominal: She exhibits no distension.  Musculoskeletal: She exhibits no edema.  Neurological: She is alert and oriented to person, place, and time. She exhibits normal muscle tone.  Skin: Skin is warm. Capillary refill takes less than 2 seconds.  Psychiatric: She has a normal mood and affect.  Nursing note and vitals reviewed.    ED Treatments / Results  Labs (all labs ordered are listed, but only abnormal results are displayed) Labs Reviewed - No data to display  EKG  EKG Interpretation None       Radiology Dg Ribs Unilateral W/chest Left  Result Date: 10/14/2016 CLINICAL DATA:  Left anterior rib pain after injury earlier today. EXAM: LEFT RIBS AND CHEST - 3+ VIEW COMPARISON:  06/03/2016 chest radiograph. FINDINGS: Stable cardiomediastinal silhouette with normal heart size. No pneumothorax. No pleural effusion. No pulmonary edema. Mild curvilinear  lateral left lung base opacity, favor mild scarring or atelectasis. The area of symptomatic concern as indicated by the patient in the left lower chest wall was denoted with a metallic skin BB by the technologist. No fracture or suspicious focal osseous lesion is seen in the left ribs. IMPRESSION: 1. No left rib fracture detected. Should the patient's symptoms persist or worsen, repeat radiographs of the ribs in 10 - 14 days maybe of use to detect subtle nondisplaced rib fractures (which are commonly occult on initial imaging). 2. Mild scarring versus atelectasis at the lateral left lung base. Otherwise no active disease in the chest. Electronically Signed   By: Ilona Sorrel M.D.   On: 10/14/2016 12:59   Mr Hand Right Wo Contrast  Result Date: 10/13/2016 CLINICAL DATA:  Right middle MCP joint pain and swelling for 4 months. EXAM: MRI OF THE RIGHT HAND WITHOUT CONTRAST TECHNIQUE: Multiplanar, multisequence MR imaging of the right hand was performed. No intravenous contrast was administered. COMPARISON:  None. FINDINGS: Bones/Joint/Cartilage No marrow signal abnormality. No fracture or dislocation. Normal alignment. No joint effusion. Ligaments, Muscles and Tendons Flexor and extensor compartment tendons are intact. Severe thickening and indistinctness of the ulnar sagittal band concerning for severe injury with a tear. Slight radial subluxation of the third extensor communis tendon with surrounding synovitis. Thickening of the radial sagittal band of the third MCP joint. Sagittal bands of the remainder of the MCP joints are intact. Soft tissue No fluid collection or hematoma.  No soft tissue mass. IMPRESSION: Severe thickening and indistinctness of the ulnar sagittal band concerning for severe injury with a tear. Slight radial subluxation of the third extensor communis tendon with surrounding synovitis. Thickening of the radial sagittal band of the third MCP joint. Electronically Signed   By: Kathreen Devoid   On:  10/13/2016 10:13    Procedures Procedures (including critical care time)  Medications Ordered in ED Medications  HYDROmorphone (DILAUDID) injection 0.5 mg (not administered)  ketorolac (TORADOL) injection 60 mg (not administered)     Initial Impression / Assessment and Plan / ED Course  I have reviewed the triage vital signs and the nursing notes.  Pertinent labs & imaging results that were available during my care of the patient were reviewed by me and considered in my medical decision making (see chart for details).  Clinical Course     70 year old female  with history of recurrent rib fractures here with left chest wall pain after bending down and feeling a popping sensation. On arrival, vital signs are stable and within normal limits. Examination is as above. Plain film showed no rib fracture or underlying pulmonary abnormality. Patient satting well on room air. Will treat for likely rib contusion versus sprain with analgesia and incentive spirometry. Patient has had previous rib fractures and is aware of return precautions. Will discharge home.  Final Clinical Impressions(s) / ED Diagnoses   Final diagnoses:  Contusion of rib on left side, initial encounter    New Prescriptions New Prescriptions   CYCLOBENZAPRINE (FLEXERIL) 5 MG TABLET    Take 1 tablet (5 mg total) by mouth 3 (three) times daily as needed for muscle spasms.   HYDROCODONE-ACETAMINOPHEN (NORCO/VICODIN) 5-325 MG TABLET    Take 1-2 tablets by mouth every 4 (four) hours as needed for moderate pain or severe pain.     Duffy Bruce, MD 10/14/16 [phone]

## 2016-10-14 NOTE — ED Triage Notes (Signed)
Per pt, states she leaned over something and heard a "crack"-thinks she might have broken/bruised a rib on left side-no respiratory distress at this time

## 2016-10-16 ENCOUNTER — Other Ambulatory Visit: Payer: Self-pay

## 2016-10-20 DIAGNOSIS — M66241 Spontaneous rupture of extensor tendons, right hand: Secondary | ICD-10-CM | POA: Diagnosis not present

## 2016-10-21 ENCOUNTER — Other Ambulatory Visit: Payer: Self-pay | Admitting: Orthopedic Surgery

## 2016-10-26 ENCOUNTER — Encounter: Payer: Self-pay | Admitting: Family Medicine

## 2016-10-26 ENCOUNTER — Ambulatory Visit (INDEPENDENT_AMBULATORY_CARE_PROVIDER_SITE_OTHER): Payer: Medicare Other | Admitting: Family Medicine

## 2016-10-26 VITALS — BP 115/82 | HR 77 | Temp 98.3°F

## 2016-10-26 DIAGNOSIS — G609 Hereditary and idiopathic neuropathy, unspecified: Secondary | ICD-10-CM | POA: Diagnosis not present

## 2016-10-26 DIAGNOSIS — J019 Acute sinusitis, unspecified: Secondary | ICD-10-CM

## 2016-10-26 DIAGNOSIS — S86891A Other injury of other muscle(s) and tendon(s) at lower leg level, right leg, initial encounter: Secondary | ICD-10-CM | POA: Diagnosis not present

## 2016-10-26 LAB — CBC WITH DIFFERENTIAL/PLATELET
BASOS PCT: 0.8 % (ref 0.0–3.0)
Basophils Absolute: 0.1 10*3/uL (ref 0.0–0.1)
Eosinophils Absolute: 0.3 10*3/uL (ref 0.0–0.7)
Eosinophils Relative: 3.9 % (ref 0.0–5.0)
HEMATOCRIT: 40.2 % (ref 36.0–46.0)
HEMOGLOBIN: 13.8 g/dL (ref 12.0–15.0)
Lymphocytes Relative: 36.8 % (ref 12.0–46.0)
Lymphs Abs: 2.7 10*3/uL (ref 0.7–4.0)
MCHC: 34.3 g/dL (ref 30.0–36.0)
MCV: 88 fl (ref 78.0–100.0)
Monocytes Absolute: 0.5 10*3/uL (ref 0.1–1.0)
Monocytes Relative: 6.2 % (ref 3.0–12.0)
Neutro Abs: 3.9 10*3/uL (ref 1.4–7.7)
Neutrophils Relative %: 52.3 % (ref 43.0–77.0)
Platelets: 298 10*3/uL (ref 150.0–400.0)
RBC: 4.57 Mil/uL (ref 3.87–5.11)
RDW: 13.3 % (ref 11.5–15.5)
WBC: 7.4 10*3/uL (ref 4.0–10.5)

## 2016-10-26 LAB — VITAMIN B12: Vitamin B-12: 357 pg/mL (ref 211–911)

## 2016-10-26 LAB — BASIC METABOLIC PANEL
BUN: 16 mg/dL (ref 6–23)
CALCIUM: 9 mg/dL (ref 8.4–10.5)
CHLORIDE: 107 meq/L (ref 96–112)
CO2: 26 meq/L (ref 19–32)
CREATININE: 0.74 mg/dL (ref 0.40–1.20)
GFR: 82.89 mL/min (ref >60.00)
GLUCOSE: 88 mg/dL (ref 70–99)
Potassium: 4 mEq/L (ref 3.5–5.1)
Sodium: 142 mEq/L (ref 135–145)

## 2016-10-26 LAB — TSH: TSH: 3.71 u[IU]/mL (ref 0.35–4.50)

## 2016-10-26 MED ORDER — AZITHROMYCIN 250 MG PO TABS: ORAL_TABLET | ORAL | 0 refills | Status: DC

## 2016-10-26 NOTE — Progress Notes (Signed)
Pre visit review using our clinic review tool, if applicable. No additional management support is needed unless otherwise documented below in the visit note. Pt unable to stand and weigh.   

## 2016-10-26 NOTE — Progress Notes (Signed)
   Subjective:    Patient ID: Audrey Brown, female    DOB: 05-03-1946, 70 y.o.   MRN: [id number]  HPI  here for several issues. First she has had intermittent swelling and pain in the right shin area for the past 2 years. She has a habit of crossing her legs all the time with the left leg over the right one. Also for the past 6 months she has had numbness and tingling in both lower legs and feet. They do not swell. Her hands are not involved. Third for one week she has had sinus pressure, PND, and is coughing up green sputum. No fever.   Review of Systems  Constitutional: Negative.   HENT: Positive for congestion, postnasal drip, sinus pain and sinus pressure. Negative for sore throat.   Eyes: Negative.   Respiratory: Positive for cough.   Neurological: Positive for numbness.       Objective:   Physical Exam  Constitutional: She is oriented to person, place, and time. She appears well-developed and well-nourished.  HENT:  Right Ear: External ear normal.  Left Ear: External ear normal.  Nose: Nose normal.  Mouth/Throat: Oropharynx is clear and moist.  Eyes: Conjunctivae are normal.  Neck: No thyromegaly present.  Cardiovascular: Normal rate, regular rhythm, normal heart sounds and intact distal pulses.   Pulmonary/Chest: Effort normal and breath sounds normal.  Musculoskeletal: She exhibits no edema.  Left lower leg and shin area are normal, not tender   Lymphadenopathy:    She has no cervical adenopathy.  Neurological: She is alert and oriented to person, place, and time.          Assessment & Plan:  She seems to have a mild case of shin splints, and I agree that her habit of crossing the legs in the same manner all the time has caused this. I simply advised her to alternate the manner in which she crosses them. For the sinusitis, treat with a Zpack. She has some peripheral neuropathy, and we will will get some labs today to investigate the etiology.  Laurey Morale,  MD

## 2016-11-09 DIAGNOSIS — S63659A Sprain of metacarpophalangeal joint of unspecified finger, initial encounter: Secondary | ICD-10-CM

## 2016-11-09 HISTORY — DX: Sprain of metacarpophalangeal joint of unspecified finger, initial encounter: S63.659A

## 2016-11-16 ENCOUNTER — Telehealth: Payer: Self-pay | Admitting: Family Medicine

## 2016-11-16 ENCOUNTER — Other Ambulatory Visit: Payer: Self-pay | Admitting: Internal Medicine

## 2016-11-16 MED ORDER — ESZOPICLONE 3 MG PO TABS
ORAL_TABLET | ORAL | 1 refills | Status: DC
Start: 2016-11-16 — End: 2016-11-26

## 2016-11-16 NOTE — Telephone Encounter (Signed)
Refill request for Eszopiclone 3 mg and a 90 day supply to [facility].

## 2016-11-16 NOTE — Telephone Encounter (Signed)
rx is ready to fax  

## 2016-11-17 NOTE — Telephone Encounter (Signed)
Rx faxed

## 2016-11-17 NOTE — Telephone Encounter (Signed)
error 

## 2016-11-23 ENCOUNTER — Other Ambulatory Visit: Payer: Self-pay | Admitting: Internal Medicine

## 2016-11-26 ENCOUNTER — Encounter (HOSPITAL_BASED_OUTPATIENT_CLINIC_OR_DEPARTMENT_OTHER): Payer: Self-pay | Admitting: *Deleted

## 2016-12-01 ENCOUNTER — Ambulatory Visit (HOSPITAL_BASED_OUTPATIENT_CLINIC_OR_DEPARTMENT_OTHER): Payer: Medicare Other | Admitting: Certified Registered"

## 2016-12-01 ENCOUNTER — Encounter (HOSPITAL_BASED_OUTPATIENT_CLINIC_OR_DEPARTMENT_OTHER): Payer: Self-pay | Admitting: *Deleted

## 2016-12-01 ENCOUNTER — Encounter (HOSPITAL_BASED_OUTPATIENT_CLINIC_OR_DEPARTMENT_OTHER)
Admission: RE | Disposition: A | Discharge status: Home / Self Care | Payer: Self-pay | Source: Ambulatory Visit | Attending: Orthopedic Surgery

## 2016-12-01 ENCOUNTER — Ambulatory Visit (HOSPITAL_BASED_OUTPATIENT_CLINIC_OR_DEPARTMENT_OTHER)
Admission: RE | Admit: 2016-12-01 | Discharge: 2016-12-01 | Disposition: A | Discharge status: Home / Self Care | Payer: Medicare Other | Source: Ambulatory Visit | Attending: Orthopedic Surgery | Admitting: Orthopedic Surgery

## 2016-12-01 DIAGNOSIS — Z79899 Other long term (current) drug therapy: Secondary | ICD-10-CM | POA: Diagnosis not present

## 2016-12-01 DIAGNOSIS — K449 Diaphragmatic hernia without obstruction or gangrene: Secondary | ICD-10-CM | POA: Diagnosis not present

## 2016-12-01 DIAGNOSIS — Z6830 Body mass index (BMI) 30.0-30.9, adult: Secondary | ICD-10-CM | POA: Diagnosis not present

## 2016-12-01 DIAGNOSIS — F319 Bipolar disorder, unspecified: Secondary | ICD-10-CM | POA: Diagnosis not present

## 2016-12-01 DIAGNOSIS — K219 Gastro-esophageal reflux disease without esophagitis: Secondary | ICD-10-CM | POA: Insufficient documentation

## 2016-12-01 DIAGNOSIS — X58XXXA Exposure to other specified factors, initial encounter: Secondary | ICD-10-CM | POA: Diagnosis not present

## 2016-12-01 DIAGNOSIS — F0280 Dementia in other diseases classified elsewhere without behavioral disturbance: Secondary | ICD-10-CM | POA: Insufficient documentation

## 2016-12-01 DIAGNOSIS — R1310 Dysphagia, unspecified: Secondary | ICD-10-CM | POA: Diagnosis not present

## 2016-12-01 DIAGNOSIS — Z880 Allergy status to penicillin: Secondary | ICD-10-CM | POA: Diagnosis not present

## 2016-12-01 DIAGNOSIS — M66241 Spontaneous rupture of extensor tendons, right hand: Secondary | ICD-10-CM | POA: Diagnosis not present

## 2016-12-01 DIAGNOSIS — G20 Parkinson's disease: Secondary | ICD-10-CM | POA: Diagnosis not present

## 2016-12-01 DIAGNOSIS — E669 Obesity, unspecified: Secondary | ICD-10-CM | POA: Insufficient documentation

## 2016-12-01 DIAGNOSIS — S65502A Unspecified injury of blood vessel of right middle finger, initial encounter: Secondary | ICD-10-CM | POA: Diagnosis not present

## 2016-12-01 DIAGNOSIS — S63652A Sprain of metacarpophalangeal joint of right middle finger, initial encounter: Secondary | ICD-10-CM | POA: Insufficient documentation

## 2016-12-01 DIAGNOSIS — M1990 Unspecified osteoarthritis, unspecified site: Secondary | ICD-10-CM | POA: Diagnosis not present

## 2016-12-01 HISTORY — DX: Depression, unspecified: F32.A

## 2016-12-01 HISTORY — DX: Sprain of metacarpophalangeal joint of unspecified finger, initial encounter: S63.659A

## 2016-12-01 HISTORY — DX: Bipolar disorder, unspecified: F31.9

## 2016-12-01 HISTORY — DX: Unspecified osteoarthritis, unspecified site: M19.90

## 2016-12-01 HISTORY — DX: Other amnesia: R41.3

## 2016-12-01 HISTORY — DX: Major depressive disorder, single episode, unspecified: F32.9

## 2016-12-01 HISTORY — PX: TENDON REPAIR: SHX5111

## 2016-12-01 SURGERY — TENDON REPAIR
Anesthesia: General | Site: Finger | Laterality: Right

## 2016-12-01 MED ORDER — OXYCODONE HCL 5 MG PO TABS
5.0000 mg | ORAL_TABLET | Freq: Once | ORAL | Status: AC | PRN
  Administered 2016-12-01: 5 mg via ORAL

## 2016-12-01 MED ORDER — MIDAZOLAM HCL 2 MG/2ML IJ SOLN: 1.0000 mg | INTRAMUSCULAR | Status: DC | PRN

## 2016-12-01 MED ORDER — CHLORHEXIDINE GLUCONATE 4 % EX LIQD: 60.0000 mL | Freq: Once | CUTANEOUS | Status: DC

## 2016-12-01 MED ORDER — VANCOMYCIN HCL IN DEXTROSE 1-5 GM/200ML-% IV SOLN
INTRAVENOUS | Status: AC
  Filled 2016-12-01: qty 200

## 2016-12-01 MED ORDER — FENTANYL CITRATE (PF) 100 MCG/2ML IJ SOLN
50.0000 ug | INTRAMUSCULAR | Status: DC | PRN
  Administered 2016-12-01 (×2): 50 ug via INTRAVENOUS

## 2016-12-01 MED ORDER — FENTANYL CITRATE (PF) 100 MCG/2ML IJ SOLN: 25.0000 ug | INTRAMUSCULAR | Status: DC | PRN

## 2016-12-01 MED ORDER — PROMETHAZINE HCL 25 MG/ML IJ SOLN: 6.2500 mg | INTRAMUSCULAR | Status: DC | PRN

## 2016-12-01 MED ORDER — ONDANSETRON HCL 4 MG/2ML IJ SOLN
INTRAMUSCULAR | Status: DC | PRN
  Administered 2016-12-01: 4 mg via INTRAVENOUS

## 2016-12-01 MED ORDER — PHENYLEPHRINE 40 MCG/ML (10ML) SYRINGE FOR IV PUSH (FOR BLOOD PRESSURE SUPPORT)
PREFILLED_SYRINGE | INTRAVENOUS | Status: AC
  Filled 2016-12-01: qty 10

## 2016-12-01 MED ORDER — FENTANYL CITRATE (PF) 100 MCG/2ML IJ SOLN
INTRAMUSCULAR | Status: AC
  Filled 2016-12-01: qty 2

## 2016-12-01 MED ORDER — LIDOCAINE 2% (20 MG/ML) 5 ML SYRINGE
INTRAMUSCULAR | Status: DC | PRN
  Administered 2016-12-01: 50 mg via INTRAVENOUS

## 2016-12-01 MED ORDER — VANCOMYCIN HCL IN DEXTROSE 1-5 GM/200ML-% IV SOLN
1000.0000 mg | INTRAVENOUS | Status: AC
  Administered 2016-12-01: 1000 mg via INTRAVENOUS

## 2016-12-01 MED ORDER — LACTATED RINGERS IV SOLN
INTRAVENOUS | Status: DC
Start: 2016-12-01 — End: 2016-12-01
  Administered 2016-12-01: 12:00:00 via INTRAVENOUS

## 2016-12-01 MED ORDER — OXYCODONE HCL 5 MG PO TABS
ORAL_TABLET | ORAL | Status: AC
  Filled 2016-12-01: qty 1

## 2016-12-01 MED ORDER — BUPIVACAINE HCL (PF) 0.25 % IJ SOLN
INTRAMUSCULAR | Status: DC | PRN
  Administered 2016-12-01: 6 mL

## 2016-12-01 MED ORDER — MEPERIDINE HCL 25 MG/ML IJ SOLN: 6.2500 mg | INTRAMUSCULAR | Status: DC | PRN

## 2016-12-01 MED ORDER — OXYCODONE-ACETAMINOPHEN 5-325 MG PO TABS: ORAL_TABLET | ORAL | 0 refills | Status: DC

## 2016-12-01 MED ORDER — OXYCODONE HCL 5 MG/5ML PO SOLN: 5.0000 mg | Freq: Once | ORAL | Status: AC | PRN

## 2016-12-01 MED ORDER — PROPOFOL 10 MG/ML IV BOLUS
INTRAVENOUS | Status: DC | PRN
  Administered 2016-12-01: 150 mg via INTRAVENOUS

## 2016-12-01 MED ORDER — SCOPOLAMINE 1 MG/3DAYS TD PT72: 1.0000 | MEDICATED_PATCH | Freq: Once | TRANSDERMAL | Status: DC | PRN

## 2016-12-01 SURGICAL SUPPLY — 73 items
BANDAGE ACE 3X5.8 VEL STRL LF (GAUZE/BANDAGES/DRESSINGS) ×4 IMPLANT
BLADE MINI RND TIP GREEN BEAV (BLADE) ×4 IMPLANT
BLADE SURG 15 STRL LF DISP TIS (BLADE) ×4 IMPLANT
BLADE SURG 15 STRL SS (BLADE) ×4
BNDG ELASTIC 2X5.8 VLCR STR LF (GAUZE/BANDAGES/DRESSINGS) IMPLANT
BNDG ESMARK 4X9 LF (GAUZE/BANDAGES/DRESSINGS) ×4 IMPLANT
BNDG GAUZE ELAST 4 BULKY (GAUZE/BANDAGES/DRESSINGS) ×4 IMPLANT
CATH ROBINSON RED A/P 8FR (CATHETERS) IMPLANT
CHLORAPREP W/TINT 26ML (MISCELLANEOUS) ×4 IMPLANT
CORDS BIPOLAR (ELECTRODE) ×4 IMPLANT
COVER BACK TABLE 60X90IN (DRAPES) ×4 IMPLANT
COVER MAYO STAND STRL (DRAPES) ×4 IMPLANT
CUFF TOURNIQUET SINGLE 18IN (TOURNIQUET CUFF) ×4 IMPLANT
DECANTER SPIKE VIAL GLASS SM (MISCELLANEOUS) IMPLANT
DRAPE EXTREMITY T 121X128X90 (DRAPE) ×4 IMPLANT
DRAPE OEC MINIVIEW 54X84 (DRAPES) IMPLANT
DRAPE SURG 17X23 STRL (DRAPES) ×4 IMPLANT
DRSG PAD ABDOMINAL 8X10 ST (GAUZE/BANDAGES/DRESSINGS) IMPLANT
GAUZE SPONGE 4X4 12PLY STRL (GAUZE/BANDAGES/DRESSINGS) ×4 IMPLANT
GAUZE XEROFORM 1X8 LF (GAUZE/BANDAGES/DRESSINGS) ×4 IMPLANT
GLOVE BIO SURGEON STRL SZ7.5 (GLOVE) ×4 IMPLANT
GLOVE BIOGEL PI IND STRL 7.0 (GLOVE) ×4 IMPLANT
GLOVE BIOGEL PI IND STRL 8 (GLOVE) ×2 IMPLANT
GLOVE BIOGEL PI IND STRL 8.5 (GLOVE) ×2 IMPLANT
GLOVE BIOGEL PI INDICATOR 7.0 (GLOVE) ×4
GLOVE BIOGEL PI INDICATOR 8 (GLOVE) ×2
GLOVE BIOGEL PI INDICATOR 8.5 (GLOVE) ×2
GLOVE ECLIPSE 6.5 STRL STRAW (GLOVE) ×4 IMPLANT
GLOVE SURG ORTHO 8.0 STRL STRW (GLOVE) ×4 IMPLANT
GOWN STRL REUS W/ TWL LRG LVL3 (GOWN DISPOSABLE) ×2 IMPLANT
GOWN STRL REUS W/TWL LRG LVL3 (GOWN DISPOSABLE) ×2
GOWN STRL REUS W/TWL XL LVL3 (GOWN DISPOSABLE) ×8 IMPLANT
K-WIRE .035X4 (WIRE) IMPLANT
NDL SAFETY ECLIPSE 18X1.5 (NEEDLE) IMPLANT
NEEDLE HYPO 18GX1.5 SHARP (NEEDLE)
NEEDLE HYPO 22GX1.5 SAFETY (NEEDLE) IMPLANT
NEEDLE HYPO 25X1 1.5 SAFETY (NEEDLE) ×4 IMPLANT
NEEDLE KEITH (NEEDLE) IMPLANT
NS IRRIG 1000ML POUR BTL (IV SOLUTION) ×4 IMPLANT
PACK BASIN DAY SURGERY FS (CUSTOM PROCEDURE TRAY) ×4 IMPLANT
PAD CAST 3X4 CTTN HI CHSV (CAST SUPPLIES) ×2 IMPLANT
PAD CAST 4YDX4 CTTN HI CHSV (CAST SUPPLIES) IMPLANT
PADDING CAST ABS 4INX4YD NS (CAST SUPPLIES) ×2
PADDING CAST ABS COTTON 4X4 ST (CAST SUPPLIES) ×2 IMPLANT
PADDING CAST COTTON 3X4 STRL (CAST SUPPLIES) ×2
PADDING CAST COTTON 4X4 STRL (CAST SUPPLIES)
PASSER SUT SWANSON 36MM LOOP (INSTRUMENTS) IMPLANT
SLEEVE SCD COMPRESS KNEE MED (MISCELLANEOUS) ×4 IMPLANT
SLING ARM FOAM STRAP LRG (SOFTGOODS) IMPLANT
SPLINT FAST PLASTER 5X30 (CAST SUPPLIES)
SPLINT PLASTER CAST FAST 5X30 (CAST SUPPLIES) IMPLANT
SPLINT PLASTER CAST XFAST 3X15 (CAST SUPPLIES) IMPLANT
SPLINT PLASTER XTRA FASTSET 3X (CAST SUPPLIES)
STOCKINETTE 4X48 STRL (DRAPES) ×4 IMPLANT
SUT ETHIBOND 3-0 V-5 (SUTURE) IMPLANT
SUT ETHILON 3 0 PS 1 (SUTURE) IMPLANT
SUT ETHILON 4 0 PS 2 18 (SUTURE) IMPLANT
SUT FIBERWIRE 2-0 18 17.9 3/8 (SUTURE)
SUT FIBERWIRE 3-0 18 TAPR NDL (SUTURE)
SUT MERSILENE 2.0 SH NDLE (SUTURE) IMPLANT
SUT MERSILENE 4 0 P 3 (SUTURE) ×4 IMPLANT
SUT MERSILENE 6 0 P 1 (SUTURE) IMPLANT
SUT SILK 4 0 PS 2 (SUTURE) IMPLANT
SUT VIC AB 0 SH 27 (SUTURE) IMPLANT
SUT VIC AB 2-0 SH 27 (SUTURE)
SUT VIC AB 2-0 SH 27XBRD (SUTURE) IMPLANT
SUT VICRYL 4-0 PS2 18IN ABS (SUTURE) IMPLANT
SUTURE FIBERWR 2-0 18 17.9 3/8 (SUTURE) IMPLANT
SUTURE FIBERWR 3-0 18 TAPR NDL (SUTURE) IMPLANT
SYR BULB 3OZ (MISCELLANEOUS) ×4 IMPLANT
SYR CONTROL 10ML LL (SYRINGE) ×4 IMPLANT
TOWEL OR 17X24 6PK STRL BLUE (TOWEL DISPOSABLE) ×8 IMPLANT
UNDERPAD 30X30 (UNDERPADS AND DIAPERS) ×4 IMPLANT

## 2016-12-01 NOTE — Brief Op Note (Signed)
12/01/2016  2:38 PM  PATIENT:  Audrey Brown  71 y.o. female  PRE-OPERATIVE DIAGNOSIS:  right long ulnar sagittal band injury  POST-OPERATIVE DIAGNOSIS:  right long ulnar sagittal band injury  PROCEDURE:  Right long ulnar sagittal band repair  SURGEON:  Surgeon(s) and Role:    * Leanora Cover, MD - Primary    * Daryll Brod, MD - Assisting  PHYSICIAN ASSISTANT:   ASSISTANTS: Daryll Brod, MD   ANESTHESIA:   general  EBL:  Total I/O In: 500 [I.V.:500] Out: 7 [Blood:7]  BLOOD ADMINISTERED:none  DRAINS: none   LOCAL MEDICATIONS USED:  MARCAINE     SPECIMEN:  No Specimen  DISPOSITION OF SPECIMEN:  N/A  COUNTS:  YES  TOURNIQUET:   Total Tourniquet Time Documented: Upper Arm (Right) - 24 minutes Total: Upper Arm (Right) - 24 minutes   DICTATION: .Other Dictation: Dictation Number [id number]  PLAN OF CARE: Discharge to home after PACU  PATIENT DISPOSITION:  PACU - hemodynamically stable.

## 2016-12-01 NOTE — Discharge Instructions (Addendum)

## 2016-12-01 NOTE — Op Note (Signed)
[id number] 

## 2016-12-01 NOTE — H&P (Signed)
Audrey Brown is an 71 y.o. female.   Chief Complaint: right long finger sagittal band tear HPI: 71 yo female with tear of ulnar sagittal band right long finger.  It has been painful despite splinting.  She wishes to undergo reconstruction of the sagittal band.  Allergies:  Allergies  Allergen Reactions  . Celebrex [Celecoxib] Hives  . Morphine And Related Hives  . Penicillins Hives and Nausea Only        . Risperdal [Risperidone] Other (See Comments)    TREMORS  . Risperidone And Related Other (See Comments)    SEIZURES  . Statins Other (See Comments)    MUSCLE ACHES    Past Medical History:  Diagnosis Date  . Arthritis    lower back  . Bipolar disorder ([place])   . Depression   . GERD (gastroesophageal reflux disease)   . Hiatal hernia   . Parkinson disease ([place])   . Poor short-term memory   . Sagittal band rupture at metacarpophalangeal joint 11/2016   right long finger    Past Surgical History:  Procedure Laterality Date  . ANKLE SURGERY    . [place]  . CESAREAN SECTION    . CHOLECYSTECTOMY    . CYST REMOVAL NECK    . ESOPHAGOGASTRODUODENOSCOPY (EGD) WITH ESOPHAGEAL DILATION  07/27/2012   with Propofol  . ESOPHAGOGASTRODUODENOSCOPY (EGD) WITH PROPOFOL N/A 06/23/2016   Procedure: ESOPHAGOGASTRODUODENOSCOPY (EGD) WITH PROPOFOL;  Surgeon: Jerene Bears, MD;  Location: [facility];  Service: Gastroenterology;  Laterality: N/A;  . FOOT NEUROMA SURGERY Left   . ORIF DISTAL RADIUS FRACTURE Right 07/04/2010  . SAVORY DILATION N/A 06/23/2016   Procedure: SAVORY DILATION;  Surgeon: Jerene Bears, MD;  Location: [facility];  Service: Gastroenterology;  Laterality: N/A;  . TONSILLECTOMY      Family History: Family History  Problem Relation Age of Onset  . Emphysema Mother   . Heart attack Father   . Hypertension    . Lung cancer      Social History:   reports that she has never smoked. She has never used smokeless tobacco. She reports that  she does not drink alcohol or use drugs.  Medications: Medications Prior to Admission  Medication Sig Dispense Refill  . buPROPion (WELLBUTRIN XL) 300 MG 24 hr tablet Take 300 mg by mouth daily.     . busPIRone (BUSPAR) 5 MG tablet Take 5 mg by mouth 3 (three) times daily.    . Calcium 500 MG CHEW Chew 500 mg by mouth daily after lunch.     . eszopiclone (LUNESTA) 1 MG TABS tablet Take 3 mg by mouth at bedtime as needed for sleep. Take immediately before bedtime    . HYDROcodone-acetaminophen (NORCO/VICODIN) 5-325 MG tablet Take 1-2 tablets by mouth every 4 (four) hours as needed for moderate pain or severe pain. 12 tablet 0  . ibuprofen (ADVIL,MOTRIN) 800 MG tablet TAKE 1 TABLET EVERY 8 HOURS AS NEEDED FOR PAIN 270 tablet 1  . LamoTRIgine XR (LAMICTAL XR) 200 MG TB24 Take 1 tablet (200 mg total) by mouth daily. 90 tablet 1  . Magnesium 500 MG CAPS Take 500 mg by mouth daily.     Marland Kitchen NEXIUM 40 MG capsule TAKE 1 CAPSULE TWICE A DAY BEFORE MEALS 180 capsule 1  . NON FORMULARY DOMPERIDONE 10 MG. TID    . OSCIMIN 0.125 MG SUBL PLACE 1 TABLET UNDER THE TONGUE EVERY 6 HOURS AS NEEDED FOR CRAMPING 360 each 1  .  promethazine (PHENERGAN) 25 MG tablet Take 1 tablet (25 mg total) by mouth every 4 (four) hours as needed for nausea. 180 tablet 0    No results found for this or any previous visit (from the past 48 hour(s)).  No results found.   A comprehensive review of systems was negative.  Blood pressure (!) 143/66, pulse 73, temperature 98.4 F (36.9 C), temperature source Oral, resp. rate 20, height 5\' 3"  (1.6 m), weight 77.1 kg (170 lb), SpO2 100 %.  General appearance: alert, cooperative and appears stated age Head: Normocephalic, without obvious abnormality, atraumatic Neck: supple, symmetrical, trachea midline Resp: clear to auscultation bilaterally Cardio: regular rate and rhythm GI: non-tender Extremities: Intact sensation and capillary refill all digits.  +epl/fpl/io.  No  wounds. Pulses: 2+ and symmetric Skin: Skin color, texture, turgor normal. No rashes or lesions Neurologic: Grossly normal Incision/Wound:none  Assessment/Plan Right long finger ulnar sagittal band rupture.  Non operative and operative treatment options were discussed with the patient and patient wishes to proceed with operative treatment. Risks, benefits, and alternatives of surgery were discussed and the patient agrees with the plan of care.   Gaege Sangalang R 12/01/2016, 1:42 PM

## 2016-12-01 NOTE — Transfer of Care (Signed)
Immediate Anesthesia Transfer of Care Note  Patient: Audrey Brown  Procedure(s) Performed: Procedure(s): right long finger ulnar sagittal band reconstruction (Right)  Patient Location: PACU  Anesthesia Type:General  Level of Consciousness: awake and alert   Airway & Oxygen Therapy: Patient Spontanous Breathing and Patient connected to face mask oxygen  Post-op Assessment: Report given to RN and Post -op Vital signs reviewed and stable  Post vital signs: Reviewed and stable  Last Vitals:  Vitals:   12/01/16 1201 12/01/16 1438  BP: (!) 143/66   Pulse: 73 89  Resp: 20 17  Temp: 36.9 C (P) 36.6 C    Last Pain:  Vitals:   12/01/16 1201  TempSrc: Oral  PainSc: 4          Complications: No apparent anesthesia complications

## 2016-12-01 NOTE — Op Note (Signed)
I assisted Surgeon(s) and Role:    * Leanora Cover, MD - Primary    * Daryll Brod, MD - Assisting on the Procedure(s): right long finger ulnar sagittal band reconstruction on 12/01/2016.  I provided assistance on this case as follows:approach, identification of pathology, debridement, repair and closure with application of dressings and splints.  Electronically signed by: Wynonia Sours, MD Date: 12/01/2016 Time: 2:40 PM

## 2016-12-01 NOTE — Anesthesia Preprocedure Evaluation (Signed)
Anesthesia Evaluation  Patient identified by MRN, date of birth, ID band Patient awake    Reviewed: Allergy & Precautions, NPO status , Patient's Chart, lab work & pertinent test results  Airway Mallampati: III  TM Distance: >3 FB Neck ROM: Full    Dental  (+) Caps, Missing   Pulmonary pneumonia,  Hx/o Pneumonia 2 weeks ago   Pulmonary exam normal breath sounds clear to auscultation       Cardiovascular Normal cardiovascular exam Rhythm:Regular Rate:Normal     Neuro/Psych  Headaches, PSYCHIATRIC DISORDERS Depression Bipolar Disorder Dementia Parkinson's Disease Corticobasilar degeneration  Neuromuscular disease    GI/Hepatic Neg liver ROS, hiatal hernia, GERD  Medicated and Controlled,Dysphagia   Endo/Other  Obesity  Renal/GU negative Renal ROS  negative genitourinary   Musculoskeletal  (+) Arthritis , 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   Abdominal (+) + obese,   Peds  Hematology   Anesthesia Other Findings   Reproductive/Obstetrics                             Anesthesia Physical  Anesthesia Plan  ASA: III  Anesthesia Plan:    Post-op Pain Management:    Induction:   Airway Management Planned:   Additional Equipment:   Intra-op Plan:   Post-operative Plan:   Informed Consent: I have reviewed the patients History and Physical, chart, labs and discussed the procedure including the risks, benefits and alternatives for the proposed anesthesia with the patient or authorized representative who has indicated his/her understanding and acceptance.   Dental advisory given  Plan Discussed with: CRNA  Anesthesia Plan Comments:        Anesthesia Quick Evaluation

## 2016-12-01 NOTE — Anesthesia Procedure Notes (Signed)
Procedure Name: LMA Insertion Date/Time: 12/01/2016 1:51 PM Performed by: Lieutenant Diego Pre-anesthesia Checklist: Patient identified, Emergency Drugs available, Suction available and Patient being monitored Patient Re-evaluated:Patient Re-evaluated prior to inductionOxygen Delivery Method: Circle system utilized Preoxygenation: Pre-oxygenation with 100% oxygen Intubation Type: IV induction Ventilation: Mask ventilation without difficulty LMA: LMA inserted LMA Size: 4.0 Number of attempts: 1 Airway Equipment and Method: Bite block Placement Confirmation: positive ETCO2 and breath sounds checked- equal and bilateral Tube secured with: Tape Dental Injury: Teeth and Oropharynx as per pre-operative assessment

## 2016-12-02 ENCOUNTER — Encounter (HOSPITAL_BASED_OUTPATIENT_CLINIC_OR_DEPARTMENT_OTHER): Payer: Self-pay | Admitting: Orthopedic Surgery

## 2016-12-02 NOTE — Anesthesia Postprocedure Evaluation (Signed)
Anesthesia Post Note  Patient: Audrey Brown  Procedure(s) Performed: Procedure(s) (LRB): right long finger ulnar sagittal band reconstruction (Right)  Patient location during evaluation: PACU Anesthesia Type: General Level of consciousness: sedated and patient cooperative Pain management: pain level controlled Vital Signs Assessment: post-procedure vital signs reviewed and stable Respiratory status: spontaneous breathing Cardiovascular status: stable Anesthetic complications: no       Last Vitals:  Vitals:   12/01/16 1515 12/01/16 1537  BP: (!) 168/61 (!) 161/100  Pulse: 79 78  Resp: 19 20  Temp:  36.4 C    Last Pain:  Vitals:   12/01/16 1537  TempSrc: Oral  PainSc: [place]

## 2016-12-02 NOTE — Op Note (Addendum)
NAMEJENIENE, [name] NO.:  [id number]  MEDICAL RECORD NO.:  [id number]  LOCATION:                                 FACILITY:  PHYSICIAN:  Leanora Cover, MD        DATE OF BIRTH:  1946/07/30  DATE OF PROCEDURE:  12/01/2016 DATE OF DISCHARGE:  12/01/2016                              OPERATIVE REPORT   POSTOPERATIVE DIAGNOSIS:  Right long finger ulnar sagittal band tear.  POSTOPERATIVE DIAGNOSIS:  Right long finger ulnar sagittal band tear.  PROCEDURE:  Right long finger ulnar sagittal band repair.  SURGEON:  Leanora Cover, MD.  ASSISTANT:  Daryll Brod, MD.  ANESTHESIA:  General.  IV FLUIDS:  Per anesthesia flow sheet.  ESTIMATED BLOOD LOSS:  Minimal.  COMPLICATIONS:  None.  SPECIMENS:  None.  TOURNIQUET TIME:  24 minutes.  DISPOSITION:  Stable to PACU.  INDICATIONS:  [name] is a 71 year old female, who has had pain in the right long finger at the MP joint.  MRI confirms ulnar sagittal band tear.  She wished to proceed with right long finger ulnar sagittal band reconstruction.  Risks, benefits, and alternative of surgery were discussed including risk of blood loss; infection; damage to nerves, vessels, tendons, ligaments, bone; failure of surgery; need for additional surgery; complications with wound healing; continued pain. She voiced understanding of these risks and elected to proceed.  OPERATIVE COURSE:  After being identified preoperatively by myself, the patient and I agreed upon procedure and site of procedure.  Surgical site was marked.  Risks, benefits, and alternatives of surgery were reviewed.  She wished to proceed.  Surgical consent had been signed. She was given IV Ancef as preoperative antibiotic prophylaxis.  She was transferred to the operating room and placed on the operating room table in supine position with the right upper extremity on arm board.  General anesthesia was induced by Anesthesiology.  Right upper extremity  was prepped and draped in normal sterile orthopedic fashion.  Surgical pause was performed between surgeons, anesthesia, and operating room staff and all were in agreement as to the patient, procedure, and site of procedure.  Tourniquet at the proximal aspect of the extremity was inflated to 250 mmHg after exsanguination of the limb with an Esmarch bandage.  Incision was made on the dorsum of the long finger over the MP joint, carried into the subcutaneous tissues by spreading technique. The sagittal bands were easily identified.  There was a visible rent in the ulnar sagittal bands with intervening scar tissue.  It was felt that excision of the scar tissue and repair or direct repair of sagittal bands would be appropriate.  The [facility] blade was used to excise the scar tissue.  There was thickening to the capsule underneath.  Some of this thickened scar capsule was removed as well.  The capsule was not violated.  The ulnar sagittal band tear was then repaired with 4-0 Mersilene in a running fashion.  Good re-apposition was obtained with good centralization of the tendon.  The wound was copiously irrigated with sterile saline and closed with 4-0 nylon in a horizontal mattress fashion.  It was injected with 6 mL of 0.25% plain  Marcaine to aid in postoperative analgesia.  It was then dressed with sterile Xeroform, 4x4s, and wrapped with a Kerlix bandage.  A volar splint was placed and wrapped with Kerlix and Ace bandage.  Tourniquet was deflated at 24 minutes.  Fingertips were pink with brisk capillary refill after deflation of tourniquet.  Operative drapes were broken down.  The patient was awoken from anesthesia safely.  She was transferred back to stretcher and taken to PACU in stable condition.  I will see her back in the office in 1 week for postoperative followup.  Percocet 5/325 one to two p.o. q.6 hours p.r.n. pain, dispensed #20.     Leanora Cover,  MD   ______________________________ Leanora Cover, MD    KK/MEDQ  D:  12/01/2016  T:  12/02/2016  Job:  [id number]  Addendum (12/10/16): Traumatic sagittal band rupture.

## 2016-12-08 DIAGNOSIS — M79644 Pain in right finger(s): Secondary | ICD-10-CM | POA: Diagnosis not present

## 2016-12-09 DIAGNOSIS — M79644 Pain in right finger(s): Secondary | ICD-10-CM | POA: Diagnosis not present

## 2016-12-16 ENCOUNTER — Ambulatory Visit: Payer: Medicare Other | Admitting: Family Medicine

## 2016-12-17 ENCOUNTER — Ambulatory Visit: Payer: Medicare Other | Admitting: Family Medicine

## 2016-12-22 ENCOUNTER — Encounter: Payer: Self-pay | Admitting: Family Medicine

## 2016-12-22 ENCOUNTER — Ambulatory Visit (INDEPENDENT_AMBULATORY_CARE_PROVIDER_SITE_OTHER): Payer: Medicare Other | Admitting: Family Medicine

## 2016-12-22 VITALS — Temp 98.4°F

## 2016-12-22 DIAGNOSIS — F3160 Bipolar disorder, current episode mixed, unspecified: Secondary | ICD-10-CM | POA: Diagnosis not present

## 2016-12-22 MED ORDER — ALPRAZOLAM 0.25 MG PO TABS: 0.2500 mg | ORAL_TABLET | Freq: Three times a day (TID) | ORAL | 2 refills | Status: DC | PRN

## 2016-12-22 NOTE — Progress Notes (Signed)
Pre visit review using our clinic review tool, if applicable. No additional management support is needed unless otherwise documented below in the visit note. Pt unable to weigh 

## 2016-12-22 NOTE — Progress Notes (Signed)
   Subjective:    Patient ID: Audrey Brown, female    DOB: [birth year], 71 y.o.   MRN: [id number]  HPI Here asking for help with medications. She sees the mental health clinic at the [facility] for her bipolar disorder, and she was supposed to see her provider a few weeks ago. However the provider was sick and cancelled and now she cannot see them again until May. She has used Xanax on occasion in the past for anxiety and she asks if we can write for a short term supply. Overall she is doing well.    Review of Systems  Constitutional: Negative.   Respiratory: Negative.   Cardiovascular: Negative.   Psychiatric/Behavioral: Negative for agitation, confusion, decreased concentration, dysphoric mood and hallucinations. The patient is nervous/anxious.        Objective:   Physical Exam  Constitutional: She is oriented to person, place, and time. She appears well-developed and well-nourished.  Cardiovascular: Normal rate, regular rhythm, normal heart sounds and intact distal pulses.   Pulmonary/Chest: Effort normal and breath sounds normal.  Neurological: She is alert and oriented to person, place, and time.  Psychiatric: She has a normal mood and affect. Her behavior is normal. Thought content normal.          Assessment & Plan:  Bipolar disorder with occasional anxiety. Wrote for Xanax 0.25 mg to use prn.  Alysia Penna, MD

## 2016-12-27 ENCOUNTER — Other Ambulatory Visit: Payer: Self-pay | Admitting: Family Medicine

## 2016-12-30 DIAGNOSIS — M79644 Pain in right finger(s): Secondary | ICD-10-CM | POA: Diagnosis not present

## 2017-01-04 DIAGNOSIS — M25649 Stiffness of unspecified hand, not elsewhere classified: Secondary | ICD-10-CM | POA: Diagnosis not present

## 2017-01-04 DIAGNOSIS — M79644 Pain in right finger(s): Secondary | ICD-10-CM | POA: Diagnosis not present

## 2017-01-13 DIAGNOSIS — M25649 Stiffness of unspecified hand, not elsewhere classified: Secondary | ICD-10-CM | POA: Diagnosis not present

## 2017-01-13 DIAGNOSIS — M25532 Pain in left wrist: Secondary | ICD-10-CM | POA: Diagnosis not present

## 2017-02-09 ENCOUNTER — Other Ambulatory Visit: Payer: Self-pay | Admitting: Internal Medicine

## 2017-04-20 ENCOUNTER — Encounter (HOSPITAL_COMMUNITY): Payer: Self-pay | Admitting: Emergency Medicine

## 2017-04-20 ENCOUNTER — Emergency Department (HOSPITAL_COMMUNITY)
Admission: EM | Admit: 2017-04-20 | Discharge: 2017-04-20 | Disposition: A | Discharge status: Home / Self Care | Payer: Medicare Other | Attending: Emergency Medicine | Admitting: Emergency Medicine

## 2017-04-20 ENCOUNTER — Emergency Department (HOSPITAL_COMMUNITY): Payer: Medicare Other

## 2017-04-20 DIAGNOSIS — M25571 Pain in right ankle and joints of right foot: Secondary | ICD-10-CM | POA: Diagnosis not present

## 2017-04-20 DIAGNOSIS — Z79899 Other long term (current) drug therapy: Secondary | ICD-10-CM | POA: Diagnosis not present

## 2017-04-20 DIAGNOSIS — G20 Parkinson's disease: Secondary | ICD-10-CM | POA: Diagnosis not present

## 2017-04-20 LAB — CBC WITH DIFFERENTIAL/PLATELET
BASOS ABS: 0.1 10*3/uL (ref 0.0–0.1)
Basophils Relative: 1 %
Eosinophils Absolute: 0.4 10*3/uL (ref 0.0–0.7)
Eosinophils Relative: 4 %
HEMATOCRIT: 37.4 % (ref 36.0–46.0)
HEMOGLOBIN: 12.4 g/dL (ref 12.0–15.0)
LYMPHS PCT: 45 %
Lymphs Abs: 3.8 10*3/uL (ref 0.7–4.0)
MCH: 29.6 pg (ref 26.0–34.0)
MCHC: 33.2 g/dL (ref 30.0–36.0)
MCV: 89.3 fL (ref 78.0–100.0)
MONO ABS: 0.7 10*3/uL (ref 0.1–1.0)
Monocytes Relative: 8 %
NEUTROS ABS: 3.5 10*3/uL (ref 1.7–7.7)
Neutrophils Relative %: 42 %
Platelets: 291 10*3/uL (ref 150–400)
RBC: 4.19 MIL/uL (ref 3.87–5.11)
RDW: 13.1 % (ref 11.5–15.5)
WBC: 8.4 10*3/uL (ref 4.0–10.5)

## 2017-04-20 LAB — BASIC METABOLIC PANEL
ANION GAP: 8 (ref 5–15)
BUN: 19 mg/dL (ref 6–20)
CHLORIDE: 109 mmol/L (ref 101–111)
CO2: 27 mmol/L (ref 22–32)
Calcium: 9.1 mg/dL (ref 8.9–10.3)
Creatinine, Ser: 0.74 mg/dL (ref 0.44–1.00)
GFR calc Af Amer: >60 mL/min (ref >60)
GFR calc non Af Amer: >60 mL/min (ref >60)
GLUCOSE: 99 mg/dL (ref 65–99)
POTASSIUM: 3.7 mmol/L (ref 3.5–5.1)
Sodium: 144 mmol/L (ref 135–145)

## 2017-04-20 LAB — D-DIMER, QUANTITATIVE (NOT AT ARMC)

## 2017-04-20 NOTE — ED Triage Notes (Signed)
Pt reports having swelling in right leg and ankle that started suddenly at [place]. Pt still reporting pain to posterior portion of leg. Family reported that foot was discolored at home no abnormalities noted at this time. Pulses, movement, and sensation intact.

## 2017-04-20 NOTE — ED Notes (Signed)
Patient transported to X-ray 

## 2017-04-20 NOTE — ED Notes (Signed)
Patient and daughter states patient does have hx of leg cramps.Per daughter pt had a leg cramp and foot sort of bent and turned blue. After the cramp foot returned to normal. Color is normal with good pulses at this time.

## 2017-04-20 NOTE — Discharge Instructions (Signed)
It was my pleasure taking care of you today!   Thankfully, your imaging and lab work was reassuring today.   Ice and alleviate for pain relief. Ibuprofen or aleve for additional pain relief as needed. If your symptoms persist without improvement in one week, please follow up with your primary care doctor.   Return to ER for new or worsening symptoms, any additional concerns.

## 2017-04-20 NOTE — ED Provider Notes (Signed)
[facility] DEPT Provider Note   CSN: [id number] Arrival date & time: 04/20/17  0048     History   Chief Complaint Chief Complaint  Patient presents with  . Leg Pain    HPI Audrey Brown is a 71 y.o. female.  The history is provided by the patient and medical records. No language interpreter was used.  Leg Pain   Pertinent negatives include no numbness.   Audrey Brown is a 71 y.o. female  with a PMH of arthritis, bipolar disorder, parkinsonism who presents to the Emergency Department complaining of right ankle pain. Family report that she developed a very bad leg cramp in her calf around midnight. She pulled her leg up to try to get the cramp out and her ankle inverted, bending really far inward. Family states that her leg was stuck in an odd position for several seconds and foot began turning a blue color. They are unsure exactly what she did, but after a minute or less, the foot went back to it's usual position. Color slowly began to improve. Currently, color has returned to normal. No numbness or tingling. She notes that the ankle and calf feel sore, but no other complaints. No medications taken prior to arrival for symptoms.   Past Medical History:  Diagnosis Date  . Arthritis    lower back  . Bipolar disorder ([place])   . Depression   . GERD (gastroesophageal reflux disease)   . Hiatal hernia   . Parkinson disease ([place])   . Poor short-term memory   . Sagittal band rupture at metacarpophalangeal joint 11/2016   right long finger    Patient Active Problem List   Diagnosis Date Noted  . Chronic mid back pain 08/14/2014  . 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 Abnormality of gait 04/10/2014  . Lumbosacral spondylosis without myelopathy 04/10/2014  . Dysphagia 06/16/2011  . GERD with stricture 06/16/2011  . Bipolar 1 disorder, mixed ([place]) 06/16/2011  . Parkinsonian syndrome ([place]) 06/16/2011  . COMPRESSION FRACTURE, LUMBAR VERTEBRAE 11/26/2010  .  PARKINSONISM 11/13/2010  . TINEA CORPORIS 07/31/2010  . CELLULITIS, LEGS 07/31/2010  . RESTING TREMOR 07/31/2010  . DEPENDENT EDEMA 06/25/2009  . GERD 11/19/2008  . WEAKNESS 11/19/2008  . Depression 01/02/2008  . HEADACHE 01/02/2008  . CHICKENPOX, HX OF 01/02/2008    Past Surgical History:  Procedure Laterality Date  . ANKLE SURGERY    . [place]  . CESAREAN SECTION    . CHOLECYSTECTOMY    . CYST REMOVAL NECK    . ESOPHAGOGASTRODUODENOSCOPY (EGD) WITH ESOPHAGEAL DILATION  07/27/2012   with Propofol  . ESOPHAGOGASTRODUODENOSCOPY (EGD) WITH PROPOFOL N/A 06/23/2016   Procedure: ESOPHAGOGASTRODUODENOSCOPY (EGD) WITH PROPOFOL;  Surgeon: Jerene Bears, MD;  Location: [facility];  Service: Gastroenterology;  Laterality: N/A;  . FOOT NEUROMA SURGERY Left   . ORIF DISTAL RADIUS FRACTURE Right 07/04/2010  . SAVORY DILATION N/A 06/23/2016   Procedure: SAVORY DILATION;  Surgeon: Jerene Bears, MD;  Location: [facility];  Service: Gastroenterology;  Laterality: N/A;  . TENDON REPAIR Right 12/01/2016   Procedure: right long finger ulnar sagittal band reconstruction;  Surgeon: Leanora Cover, MD;  Location: [facility];  Service: Orthopedics;  Laterality: Right;  . TONSILLECTOMY      OB History    No data available       Home Medications    Prior to Admission medications   Medication Sig Start Date End Date Taking? Authorizing Provider  ALPRAZolam (  XANAX) 0.25 MG tablet Take 1 tablet (0.25 mg total) by mouth 3 (three) times daily as needed for anxiety. 12/22/16  Yes Laurey Morale, MD  buPROPion (WELLBUTRIN XL) 300 MG 24 hr tablet Take 300 mg by mouth daily.  03/27/16  Yes [provider]  busPIRone (BUSPAR) 5 MG tablet Take 5 mg by mouth 3 (three) times daily. 03/27/16  Yes [provider]  Calcium 500 MG CHEW Chew 500 mg by mouth daily after lunch.    Yes [provider]  eszopiclone (LUNESTA) 1 MG TABS tablet Take 3 mg by mouth at  bedtime as needed for sleep. Take immediately before bedtime   Yes [provider]  ibuprofen (ADVIL,MOTRIN) 800 MG tablet TAKE 1 TABLET EVERY 8 HOURS AS NEEDED FOR PAIN 12/28/16  Yes Laurey Morale, MD  LamoTRIgine XR (LAMICTAL XR) 200 MG TB24 Take 1 tablet (200 mg total) by mouth daily. 12/15/13  Yes Laurey Morale, MD  Magnesium 500 MG CAPS Take 500 mg by mouth daily.    Yes [provider]  NEXIUM 40 MG capsule TAKE 1 CAPSULE TWICE A DAY BEFORE MEALS 11/23/16  Yes Pyrtle, Lajuan Lines, MD  NON FORMULARY DOMPERIDONE 10 MG. TID   Yes [provider]  OSCIMIN 0.125 MG SUBL PLACE 1 TABLET UNDER THE TONGUE EVERY 6 HOURS AS NEEDED FOR CRAMPING 02/09/17  Yes Pyrtle, Lajuan Lines, MD  oxyCODONE-acetaminophen (PERCOCET) 5-325 MG tablet 1-2 tabs po q6 hours prn pain 12/01/16  Yes Leanora Cover, MD  promethazine (PHENERGAN) 25 MG tablet Take 1 tablet (25 mg total) by mouth every 4 (four) hours as needed for nausea. 05/27/16  Yes Pyrtle, Lajuan Lines, MD    Family History Family History  Problem Relation Age of Onset  . Emphysema Mother   . Heart attack Father   . Hypertension Unknown   . Lung cancer Unknown     Social History Social History  Substance Use Topics  . Smoking status: Never Smoker  . Smokeless tobacco: Never Used  . Alcohol use No     Allergies   Celebrex [celecoxib]; Morphine and related; Penicillins; Risperdal [risperidone]; Risperidone and related; and Statins   Review of Systems Review of Systems  Musculoskeletal: Positive for arthralgias and myalgias.  Neurological: Negative for weakness and numbness.  All other systems reviewed and are negative.    Physical Exam Updated Vital Signs BP 134/87 (BP Location: Right Arm)   Pulse 72   Temp 98.6 F (37 C) (Oral)   Resp 18   Ht 5\' 3"  (1.6 m)   Wt 77.1 kg (170 lb)   SpO2 99%   BMI 30.11 kg/m   Physical Exam  Constitutional: She is oriented to person, place, and time. She appears well-developed and  well-nourished. No distress.  HENT:  Head: Normocephalic and atraumatic.  Cardiovascular: Normal rate, regular rhythm and normal heart sounds.   No murmur heard. Pulmonary/Chest: Effort normal and breath sounds normal. No respiratory distress.  Abdominal: Soft. She exhibits no distension. There is no tenderness.  Musculoskeletal:       Feet:  Right lower extremity with diffuse tenderness as depicted in image. She also has some right calf tenderness. + swelling.  Decreased ability to dorsi/plantarflex 2/2 pain. No erythema, ecchymosis, or deformity appreciated. No break in skin. No pain to fifth metatarsal area or navicular region. Achilles intact. Good pedal pulse and cap refill of toes.Sensation grossly intact.  Neurological: She is alert and oriented to person, place, and time.  Skin: Skin is warm and dry.  Nursing note and vitals reviewed.    ED Treatments / Results  Labs (all labs ordered are listed, but only abnormal results are displayed) Labs Reviewed  D-DIMER, QUANTITATIVE (NOT AT [facility])  CBC WITH DIFFERENTIAL/PLATELET  BASIC METABOLIC PANEL    EKG  EKG Interpretation None       Radiology Dg Ankle Complete Right  Result Date: 04/20/2017 CLINICAL DATA:  Ankle pain EXAM: RIGHT ANKLE - COMPLETE 3+ VIEW COMPARISON:  None. FINDINGS: Normal alignment no acute fracture. Chronic injury medial malleolus with irregularity. No joint effusion IMPRESSION: No acute abnormality. Electronically Signed   By: Franchot Gallo M.D.   On: 04/20/2017 07:25    Procedures Procedures (including critical care time)  Medications Ordered in ED Medications - No data to display   Initial Impression / Assessment and Plan / ED Course  I have reviewed the triage vital signs and the nursing notes.  Pertinent labs & imaging results that were available during my care of the patient were reviewed by me and considered in my medical decision making (see chart for details).    Audrey Brown is a  71 y.o. female who presents to ED for right lower extremity pain. On exam, RLE is NVI with strong DP pulse and cap refill. Labs wdl including elytes and d-dimer. Ankle tender and mildly swollen but no deformities or open wounds. X-ray negative. Evaluation does not show pathology that would require ongoing emergent intervention or inpatient treatment. Symptomatic home care instructions discussed with patient and family. PCP follow up if symptoms persist. Return precautions discussed and all questions answered.   Patient seen by and discussed with Dr. Ashok Cordia who agrees with treatment plan.    Final Clinical Impressions(s) / ED Diagnoses   Final diagnoses:  Acute right ankle pain    New Prescriptions Discharge Medication List as of 04/20/2017  8:15 AM       Yasin Ducat, Ozella Almond, PA-C 04/20/17 3606    Lajean Saver, MD 04/20/17 [phone]

## 2017-04-21 ENCOUNTER — Ambulatory Visit (INDEPENDENT_AMBULATORY_CARE_PROVIDER_SITE_OTHER): Payer: Medicare Other | Admitting: Family Medicine

## 2017-04-21 ENCOUNTER — Encounter: Payer: Self-pay | Admitting: Family Medicine

## 2017-04-21 VITALS — BP 140/72 | HR 79 | Temp 98.4°F

## 2017-04-21 DIAGNOSIS — S93401D Sprain of unspecified ligament of right ankle, subsequent encounter: Secondary | ICD-10-CM | POA: Diagnosis not present

## 2017-04-21 DIAGNOSIS — J209 Acute bronchitis, unspecified: Secondary | ICD-10-CM

## 2017-04-21 DIAGNOSIS — R252 Cramp and spasm: Secondary | ICD-10-CM | POA: Diagnosis not present

## 2017-04-21 MED ORDER — AZITHROMYCIN 250 MG PO TABS: ORAL_TABLET | ORAL | 0 refills | Status: DC

## 2017-04-21 MED ORDER — PROMETHAZINE HCL 25 MG PO TABS: 25.0000 mg | ORAL_TABLET | ORAL | 2 refills | Status: DC | PRN

## 2017-04-21 MED ORDER — OXYCODONE-ACETAMINOPHEN 5-325 MG PO TABS: 1.0000 | ORAL_TABLET | Freq: Four times a day (QID) | ORAL | 0 refills | Status: DC | PRN

## 2017-04-21 MED ORDER — PROMETHAZINE HCL 25 MG PO TABS: 25.0000 mg | ORAL_TABLET | ORAL | 3 refills | Status: DC | PRN

## 2017-04-21 NOTE — Patient Instructions (Signed)
WE NOW OFFER    [facility] FAST TRACK!!!  SAME DAY Appointments for ACUTE CARE  Such as: Sprains, Injuries, cuts, abrasions, rashes, muscle pain, joint pain, back pain Colds, flu, sore throats, headache, allergies, cough, fever  Ear pain, sinus and eye infections Abdominal pain, nausea, vomiting, diarrhea, upset stomach Animal/insect bites  3 Easy Ways to Schedule: Walk-In Scheduling Call in scheduling Mychart Sign-up: [email or web address]         

## 2017-04-21 NOTE — Progress Notes (Signed)
   Subjective:    Patient ID: Audrey Brown, female    DOB: 12-10-1945, 71 y.o.   MRN: [id number]  HPI Her with her daughter for several issues. First she was seen in the ER last night for a severe cramp in the right calf that twisted her ankle so violently that the ankle has been swollen and painful. Her labs in the ER were normal including a d dimer, so a DVT was ruled out. No real explanation was given for what happened. She admits to having frequent leg cramps at night, despite drinking lots of water and taking a single 400 mg tablet of magnesium daily. Also she says she has been coughing for the past month. This is a dry cough that comes form the chest.   Review of Systems  Constitutional: Negative.   Respiratory: Positive for cough. Negative for choking, shortness of breath and wheezing.   Cardiovascular: Negative.   Musculoskeletal: Positive for myalgias.       Objective:   Physical Exam  Constitutional: She is oriented to person, place, and time. She appears well-developed and well-nourished.  HENT:  Right Ear: External ear normal.  Left Ear: External ear normal.  Nose: Nose normal.  Mouth/Throat: Oropharynx is clear and moist.  Eyes: Conjunctivae are normal.  Neck: Neck supple. No thyromegaly present.  Pulmonary/Chest: Effort normal. No respiratory distress. She has no wheezes. She has no rales.  Scattered rhonchi   Musculoskeletal:  The right calf is tender but there are no cords. The lateral right ankle is swollen and tender  Lymphadenopathy:    She has no cervical adenopathy.  Neurological: She is alert and oriented to person, place, and time.          Assessment & Plan:  She has had a lot of muscle cramps, and last night she had one so severe it sprained her ankle. She will elevate and ice the ankle. For the cramps she will increase the magnesium to 2-3 tablets daily and she will drink a glass of tonic water every night. For the bronchitis, take a Zpack.    Alysia Penna, MD

## 2017-04-23 ENCOUNTER — Other Ambulatory Visit: Payer: Self-pay | Admitting: Family Medicine

## 2017-04-23 DIAGNOSIS — Z1231 Encounter for screening mammogram for malignant neoplasm of breast: Secondary | ICD-10-CM

## 2017-04-27 ENCOUNTER — Telehealth: Payer: Self-pay | Admitting: Internal Medicine

## 2017-04-27 NOTE — Telephone Encounter (Signed)
Pt states that she takes Domperidone for her symptoms. She is applying for veterans benefits through the [place]. Pt reports Dr. Hilarie Fredrickson mentioned that there is a med that is approved by the FDA for her symptoms but it would make her parkinson's symptoms worse. She wants to submit this to see if the [employer] would pay/help cover the Domperidone. She needs this letter to submit by the deadline of next Monday 05/03/17.

## 2017-04-28 ENCOUNTER — Encounter: Payer: Self-pay | Admitting: Internal Medicine

## 2017-04-28 NOTE — Telephone Encounter (Signed)
Letter written and routed to you.

## 2017-04-29 NOTE — Telephone Encounter (Signed)
Pt knows that letter is ready for pick up.

## 2017-05-07 ENCOUNTER — Ambulatory Visit (INDEPENDENT_AMBULATORY_CARE_PROVIDER_SITE_OTHER): Payer: Medicare Other | Admitting: Family Medicine

## 2017-05-07 ENCOUNTER — Encounter: Payer: Self-pay | Admitting: Family Medicine

## 2017-05-07 VITALS — BP 126/80 | HR 74 | Temp 97.9°F | Resp 16 | Ht 63.0 in

## 2017-05-07 DIAGNOSIS — L298 Other pruritus: Secondary | ICD-10-CM | POA: Diagnosis not present

## 2017-05-07 DIAGNOSIS — R0989 Other specified symptoms and signs involving the circulatory and respiratory systems: Secondary | ICD-10-CM | POA: Diagnosis not present

## 2017-05-07 DIAGNOSIS — J989 Respiratory disorder, unspecified: Secondary | ICD-10-CM

## 2017-05-07 DIAGNOSIS — R05 Cough: Secondary | ICD-10-CM

## 2017-05-07 DIAGNOSIS — R053 Chronic cough: Secondary | ICD-10-CM

## 2017-05-07 MED ORDER — ALBUTEROL SULFATE HFA 108 (90 BASE) MCG/ACT IN AERS: 2.0000 | INHALATION_SPRAY | Freq: Four times a day (QID) | RESPIRATORY_TRACT | 0 refills | Status: DC | PRN

## 2017-05-07 MED ORDER — PREDNISONE 20 MG PO TABS: 40.0000 mg | ORAL_TABLET | Freq: Every day | ORAL | 0 refills | Status: AC

## 2017-05-07 MED ORDER — TRIAMCINOLONE ACETONIDE 0.1 % EX CREA: 1.0000 "application " | TOPICAL_CREAM | Freq: Two times a day (BID) | CUTANEOUS | 0 refills | Status: AC

## 2017-05-07 MED ORDER — BENZONATATE 100 MG PO CAPS: 200.0000 mg | ORAL_CAPSULE | Freq: Two times a day (BID) | ORAL | 0 refills | Status: AC | PRN

## 2017-05-07 NOTE — Progress Notes (Signed)
HPI:  ACUTE VISIT  Chief Complaint  Patient presents with  . Cough    [name] is a 71 y.o.female here today with her daughter complaining of > 3 weeks of productive cough. According to patient, a couple weeks ago she was treated with antibiotic treatment, which according to her daughter, "did nothing" for the cough. "A lot of cough" with greenish sputum.  She also has noted wheezing and occasionally SOB at rest. She has Hx of parkinson's disease and not ambulatory, she is in a wheel chair. Denies associated chest pain od diaphoresis. Symptoms are worse in the morning when she first gets up and at night.  She denies any history of tobacco use or asthma. She has history of GERD, currently she is on PPI. She denies any heartburn, nausea, vomiting. She states that as far as she takes her Nexium daily, symptoms are well controlled.   Cough  This is a recurrent problem. The current episode started 1 to 4 weeks ago. The problem has been unchanged. The cough is productive of brown sputum. Associated symptoms include a rash, shortness of breath and wheezing. Pertinent negatives include no chest pain, chills, ear congestion, ear pain, eye redness, headaches, heartburn, hemoptysis, myalgias, nasal congestion, postnasal drip, rhinorrhea, sore throat, sweats or weight loss. Nothing aggravates the symptoms. The treatment provided no relief. There is no history of environmental allergies.   She is not sure about fever but has felt warm today.   No Hx of recent travel. No sick contact.  Hx of allergies: Denies.  OTC medications for this problem: None  Symptoms otherwise stable.   Rash: Also c/o very pruritic rash she noted 3-4 days ago on RLE, getting bigger. OTC Hydrocortisone did not help.   She denies any new medication, detergent, soap, or body product. No known insect bite or outdoor exposures to plants. No sick contact. No Hx of eczema or similar rash in the  past.    Review of Systems  Constitutional: Positive for fatigue. Negative for activity change, appetite change, chills and weight loss.  HENT: Negative for congestion, ear pain, mouth sores, postnasal drip, rhinorrhea, sinus pressure, sore throat, trouble swallowing and voice change.   Eyes: Negative for discharge, redness and itching.  Respiratory: Positive for cough, shortness of breath and wheezing. Negative for hemoptysis.   Cardiovascular: Negative for chest pain and leg swelling.  Gastrointestinal: Negative for abdominal pain, diarrhea, heartburn, nausea and vomiting.  Musculoskeletal: Positive for gait problem. Negative for myalgias.  Skin: Positive for rash.  Allergic/Immunologic: Negative for environmental allergies.  Neurological: Negative for syncope, weakness and headaches.  Hematological: Negative for adenopathy. Does not bruise/bleed easily.  Psychiatric/Behavioral: Negative for confusion. The patient is nervous/anxious.       Current Outpatient Prescriptions on File Prior to Visit  Medication Sig Dispense Refill  . ALPRAZolam (XANAX) 0.25 MG tablet Take 1 tablet (0.25 mg total) by mouth 3 (three) times daily as needed for anxiety. 90 tablet 2  . buPROPion (WELLBUTRIN XL) 300 MG 24 hr tablet Take 300 mg by mouth daily.     . busPIRone (BUSPAR) 5 MG tablet Take 5 mg by mouth 3 (three) times daily.    . Calcium 500 MG CHEW Chew 500 mg by mouth daily after lunch.     . eszopiclone (LUNESTA) 1 MG TABS tablet Take 3 mg by mouth at bedtime as needed for sleep. Take immediately before bedtime    . ibuprofen (ADVIL,MOTRIN) 800 MG tablet  TAKE 1 TABLET EVERY 8 HOURS AS NEEDED FOR PAIN 270 tablet 1  . LamoTRIgine XR (LAMICTAL XR) 200 MG TB24 Take 1 tablet (200 mg total) by mouth daily. 90 tablet 1  . Magnesium 500 MG CAPS Take 500 mg by mouth daily.     Marland Kitchen NEXIUM 40 MG capsule TAKE 1 CAPSULE TWICE A DAY BEFORE MEALS 180 capsule 1  . NON FORMULARY DOMPERIDONE 10 MG. TID    .  OSCIMIN 0.125 MG SUBL PLACE 1 TABLET UNDER THE TONGUE EVERY 6 HOURS AS NEEDED FOR CRAMPING 360 each 0  . oxyCODONE-acetaminophen (PERCOCET) 5-325 MG tablet Take 1 tablet by mouth every 6 (six) hours as needed for severe pain. 1-2 tabs po q6 hours prn pain 60 tablet 0  . promethazine (PHENERGAN) 25 MG tablet Take 1 tablet (25 mg total) by mouth every 4 (four) hours as needed for nausea. 180 tablet 2  . promethazine (PHENERGAN) 25 MG tablet Take 1 tablet (25 mg total) by mouth every 4 (four) hours as needed for nausea. 180 tablet 3   No current facility-administered medications on file prior to visit.      Past Medical History:  Diagnosis Date  . Arthritis    lower back  . Bipolar disorder ([place])   . Depression   . GERD (gastroesophageal reflux disease)   . Hiatal hernia   . Parkinson disease ([place])   . Poor short-term memory   . Sagittal band rupture at metacarpophalangeal joint 11/2016   right long finger   Allergies  Allergen Reactions  . Celebrex [Celecoxib] Hives  . Morphine And Related Hives  . Penicillins Hives and Nausea Only        . Risperdal [Risperidone] Other (See Comments)    TREMORS  . Risperidone And Related Other (See Comments)    SEIZURES  . Statins Other (See Comments)    MUSCLE ACHES    Social History   Social History  . Marital status: Widowed    Spouse name: N/A  . Number of children: 4  . Years of education: N/A   Occupational History  . Retired Retired   Social History Main Topics  . Smoking status: Never Smoker  . Smokeless tobacco: Never Used  . Alcohol use No  . Drug use: No  . Sexual activity: Not Asked   Other Topics Concern  . None   Social History Narrative   Daughter is her POA and must come to all visits in GI and [facility]     Vitals:   05/07/17 1439  BP: 126/80  Pulse: 74  Resp: 16  Temp: 97.9 F (36.6 C)  O2 sat at RA 98% There is no height or weight on file to calculate BMI.   Physical Exam  Nursing note and vitals  reviewed. Constitutional: She appears well-developed. She does not appear ill. No distress.  HENT:  Head: Atraumatic.  Mouth/Throat: Oropharynx is clear and moist and mucous membranes are normal.  Eyes: Conjunctivae and EOM are normal.  Neck: No muscular tenderness present.  Cardiovascular: Normal rate and regular rhythm.   No murmur heard. Respiratory: Effort normal and breath sounds normal. No respiratory distress. She has no wheezes. She has no rales.  One episode of cough, productive during examination.  Musculoskeletal: She exhibits no edema or tenderness.  Lymphadenopathy:    She has no cervical adenopathy.  Neurological: She is alert.  No focal deficit appreciated. She is in a wheel chair.  Skin: Skin is warm. Rash noted. No  ecchymosis noted. No erythema.     RLE, pretibial area confluent,micropapular erythematous lesions.No tender and no local heat.  Psychiatric: Her speech is normal. Her mood appears anxious.  Well groomed, good eye contact.    ASSESSMENT AND PLAN:   Morganna was seen today for cough.  Diagnoses and all orders for this visit:  Cough, persistent  Plan discussed possible etiologies: Allergies, GERD, COPD, residual symptoms after URI, and CAP among some. She was already treated with azithromycin on 04/21/2017 with no improvement. Today lung examination negative for rales or rhonchi. Further recommendations will be given according to imaging results.  -     DG Chest 2 View; Future -     benzonatate (TESSALON) 100 MG capsule; Take 2 capsules (200 mg total) by mouth 2 (two) times daily as needed for cough.  Reactive airway disease that is not asthma  She is reporting episodes of wheezing, today lung auscultation negative. She agrees with short course of Prednisone and Albuterol inhaler. Albuterol inh 2 puff every 6 hours for a week then as needed for wheezing or shortness of breath.   We discussed some side effects of antibiotics, she agrees on  holding on this for now. Further recommendations will be given according to CXR results. Instructed about warning signs. Follow-up with PCP in 2-3 weeks.  -     predniSONE (DELTASONE) 20 MG tablet; Take 2 tablets (40 mg total) by mouth daily with breakfast. -     albuterol (PROVENTIL HFA;VENTOLIN HFA) 108 (90 Base) MCG/ACT inhaler; Inhale 2 puffs into the lungs every 6 (six) hours as needed for wheezing or shortness of breath.  Pruritic erythematous rash  ? Insect bite. Recommended topical Triamcinolone cream bid for up to 14 days. Monitor for signs of infection. Follow-up with PCP as needed.  -     Triamcinolone cream (KENALOG) 0.1 %; Apply 1 application topically 2 (two) times daily.   25 min face to face OV. > 50% was dedicated to discussion of differential Dx, possible complications, treatment options, and some side effects of medications.   -Ms. Osker Mason and her daughter were advised to seek attention immediately if symptoms worsen or to follow if they persist or new concerns arise.       Jaythen Hamme G. Martinique, MD  [facility]. [place] office.

## 2017-05-07 NOTE — Patient Instructions (Signed)
  [name] I have seen you today for an acute visit.  A few things to remember from today's visit:   Cough, persistent - Plan: DG Chest 2 View, benzonatate (TESSALON) 100 MG capsule  Reactive airway disease that is not asthma - Plan: predniSONE (DELTASONE) 20 MG tablet  ? GERD. ? Allergies.   Albuterol inh 2 puff every 6 hours for a week then as needed for wheezing or shortness of breath.     Today X ray was ordered.  This can be done at [facility] at [facility] between 8 am and 5 pm: [place]. [phone].    Medications prescribed today are intended for short period of time and will not be refill upon request, a follow up appointment might be necessary to discuss continuation of of treatment if appropriate.     In general please monitor for signs of worsening symptoms and seek immediate medical attention if any concerning.   Please be sure you have an appointment already scheduled with your PCP before you leave today.

## 2017-05-10 ENCOUNTER — Other Ambulatory Visit: Payer: Self-pay | Admitting: Internal Medicine

## 2017-05-22 ENCOUNTER — Other Ambulatory Visit: Payer: Self-pay | Admitting: Internal Medicine

## 2017-05-31 ENCOUNTER — Ambulatory Visit: Payer: Medicare Other

## 2017-06-04 ENCOUNTER — Telehealth: Payer: Self-pay | Admitting: Internal Medicine

## 2017-06-04 MED ORDER — AMBULATORY NON FORMULARY MEDICATION: 0 refills | Status: DC

## 2017-06-04 NOTE — Telephone Encounter (Signed)
Ok to refill domperidone She does need to have an EKG done and the results faxed to me OV can be next available We can discuss cologuard at that next OV Thanks

## 2017-06-04 NOTE — Telephone Encounter (Signed)
Rx for domperidone faxed to [facility] online. Patient states that she will contact her PCP for EKG. Patient has also scheduled first available follow up with Dr Hilarie Fredrickson on 08/09/17 @ 10 am.

## 2017-06-04 NOTE — Telephone Encounter (Signed)
Patient requesting refills on domperidone. Last EKG 05/2016. Do you want repeat EKG first? Return office visit? Repeat cologuard (last office note indicates previous cologuard done late 2015 but I cannot find this.Marland Kitchen)

## 2017-06-08 MED ORDER — AMBULATORY NON FORMULARY MEDICATION: 0 refills | Status: DC

## 2017-06-08 NOTE — Addendum Note (Signed)
Addended by: Larina Bras on: 06/08/2017 02:49 PM   Modules accepted: Orders

## 2017-06-09 ENCOUNTER — Ambulatory Visit (INDEPENDENT_AMBULATORY_CARE_PROVIDER_SITE_OTHER): Payer: Medicare Other | Admitting: Family Medicine

## 2017-06-09 ENCOUNTER — Encounter: Payer: Self-pay | Admitting: Family Medicine

## 2017-06-09 VITALS — BP 138/80 | Temp 98.3°F

## 2017-06-09 DIAGNOSIS — G8929 Other chronic pain: Secondary | ICD-10-CM

## 2017-06-09 DIAGNOSIS — M546 Pain in thoracic spine: Secondary | ICD-10-CM | POA: Diagnosis not present

## 2017-06-09 DIAGNOSIS — G20A1 Parkinson's disease without dyskinesia, without mention of fluctuations: Secondary | ICD-10-CM

## 2017-06-09 DIAGNOSIS — G4700 Insomnia, unspecified: Secondary | ICD-10-CM | POA: Insufficient documentation

## 2017-06-09 DIAGNOSIS — G20 Parkinson's disease: Secondary | ICD-10-CM

## 2017-06-09 DIAGNOSIS — K219 Gastro-esophageal reflux disease without esophagitis: Secondary | ICD-10-CM | POA: Diagnosis not present

## 2017-06-09 DIAGNOSIS — R1310 Dysphagia, unspecified: Secondary | ICD-10-CM

## 2017-06-09 DIAGNOSIS — Z9189 Other specified personal risk factors, not elsewhere classified: Secondary | ICD-10-CM | POA: Diagnosis not present

## 2017-06-09 DIAGNOSIS — M47817 Spondylosis without myelopathy or radiculopathy, lumbosacral region: Secondary | ICD-10-CM

## 2017-06-09 MED ORDER — ESZOPICLONE 1 MG PO TABS: 3.0000 mg | ORAL_TABLET | Freq: Every evening | ORAL | 5 refills | Status: DC | PRN

## 2017-06-09 MED ORDER — TRAMADOL HCL 50 MG PO TABS: 100.0000 mg | ORAL_TABLET | Freq: Three times a day (TID) | ORAL | 2 refills | Status: DC | PRN

## 2017-06-09 NOTE — Patient Instructions (Signed)
WE NOW OFFER   [facility] FAST TRACK!!!  SAME DAY Appointments for ACUTE CARE  Such as: Sprains, Injuries, cuts, abrasions, rashes, muscle pain, joint pain, back pain Colds, flu, sore throats, headache, allergies, cough, fever  Ear pain, sinus and eye infections Abdominal pain, nausea, vomiting, diarrhea, upset stomach Animal/insect bites  3 Easy Ways to Schedule: Walk-In Scheduling Call in scheduling Mychart Sign-up: [email or web address]         

## 2017-06-09 NOTE — Progress Notes (Signed)
   Subjective:    Patient ID: Audrey Brown, female    DOB: 12-01-1945, 71 y.o.   MRN: [id number]  HPI Here for several issues. First she takes Domperidone for her GI symptoms and once a year it is recommended that she get an EKG to check for side effects. Also she has trouble sleeping again and she wants to get back on Lunesta. Also she has had more trouble with stiffness and pain in the lower back. This pain often radiates down both legs. She takes Ibuprofen almost every day.    Review of Systems  Constitutional: Negative.   Respiratory: Negative.   Cardiovascular: Negative.   Musculoskeletal: Positive for back pain.  Psychiatric/Behavioral: Positive for sleep disturbance.       Objective:   Physical Exam  Constitutional: She is oriented to person, place, and time.  In her wheelchair, but she gets up on the exam table with assistance   Cardiovascular: Normal rate, regular rhythm, normal heart sounds and intact distal pulses.   EKG is within normal limits.   Pulmonary/Chest: Effort normal and breath sounds normal. No respiratory distress. She has no wheezes. She has no rales.  Musculoskeletal:  Tender in the middle and lower back, ROM is limited by pain. SLR are negative   Neurological: She is alert and oriented to person, place, and time.          Assessment & Plan:  Her EKG is unremarkable so there are no signs of cardiac issues with the Domperidone. For her insomnia she will restart Lunesta at bedtime. For the back pain she can add Tramadol to the Ibuprofen as needed, and we will refer her to PT.  Alysia Penna, MD

## 2017-06-10 ENCOUNTER — Telehealth: Payer: Self-pay | Admitting: Internal Medicine

## 2017-06-10 ENCOUNTER — Telehealth: Payer: Self-pay | Admitting: Family Medicine

## 2017-06-10 NOTE — Telephone Encounter (Signed)
° °  Pt call to say she has been getting 3mg  of the below med but his time she was only given 1mg  and to take 3 pills which will only give her a 10 day supply. Pt would like a call back to discuss   [phone]

## 2017-06-10 NOTE — Telephone Encounter (Signed)
Per Raman at [facility], domperidone rx was received on 06/08/17. They will contact patient. I have also contacted patient to make her aware they will be in touch.

## 2017-06-10 NOTE — Telephone Encounter (Signed)
Looks like she is talking about Lunesta?

## 2017-06-10 NOTE — Telephone Encounter (Signed)
I agree this was not correct. Please change this to Lunesta 3 mg to take qhs. Call in #[phone] with one rf

## 2017-06-15 DIAGNOSIS — R293 Abnormal posture: Secondary | ICD-10-CM | POA: Diagnosis not present

## 2017-06-15 DIAGNOSIS — R296 Repeated falls: Secondary | ICD-10-CM | POA: Diagnosis not present

## 2017-06-15 DIAGNOSIS — R531 Weakness: Secondary | ICD-10-CM | POA: Diagnosis not present

## 2017-06-15 DIAGNOSIS — M545 Low back pain: Secondary | ICD-10-CM | POA: Diagnosis not present

## 2017-06-15 MED ORDER — ESZOPICLONE 3 MG PO TABS: 3.0000 mg | ORAL_TABLET | Freq: Every day | ORAL | 1 refills | Status: DC

## 2017-06-15 NOTE — Telephone Encounter (Signed)
I called in new script to [facility], deleted the 1 mg and asked pharmacy to cancel that script. I left a voice message for pt to pick up new script.

## 2017-06-18 DIAGNOSIS — R293 Abnormal posture: Secondary | ICD-10-CM | POA: Diagnosis not present

## 2017-06-18 DIAGNOSIS — M545 Low back pain: Secondary | ICD-10-CM | POA: Diagnosis not present

## 2017-06-18 DIAGNOSIS — R296 Repeated falls: Secondary | ICD-10-CM | POA: Diagnosis not present

## 2017-06-18 DIAGNOSIS — R531 Weakness: Secondary | ICD-10-CM | POA: Diagnosis not present

## 2017-06-22 DIAGNOSIS — R293 Abnormal posture: Secondary | ICD-10-CM | POA: Diagnosis not present

## 2017-06-22 DIAGNOSIS — M545 Low back pain: Secondary | ICD-10-CM | POA: Diagnosis not present

## 2017-06-22 DIAGNOSIS — R531 Weakness: Secondary | ICD-10-CM | POA: Diagnosis not present

## 2017-06-22 DIAGNOSIS — R296 Repeated falls: Secondary | ICD-10-CM | POA: Diagnosis not present

## 2017-06-25 ENCOUNTER — Other Ambulatory Visit: Payer: Self-pay | Admitting: Family Medicine

## 2017-06-25 DIAGNOSIS — M545 Low back pain: Secondary | ICD-10-CM | POA: Diagnosis not present

## 2017-06-25 DIAGNOSIS — R531 Weakness: Secondary | ICD-10-CM | POA: Diagnosis not present

## 2017-06-25 DIAGNOSIS — R293 Abnormal posture: Secondary | ICD-10-CM | POA: Diagnosis not present

## 2017-06-25 DIAGNOSIS — R296 Repeated falls: Secondary | ICD-10-CM | POA: Diagnosis not present

## 2017-06-25 NOTE — Telephone Encounter (Signed)
Can we refill this? 

## 2017-06-29 DIAGNOSIS — R531 Weakness: Secondary | ICD-10-CM | POA: Diagnosis not present

## 2017-06-29 DIAGNOSIS — M545 Low back pain: Secondary | ICD-10-CM | POA: Diagnosis not present

## 2017-06-29 DIAGNOSIS — R296 Repeated falls: Secondary | ICD-10-CM | POA: Diagnosis not present

## 2017-06-29 DIAGNOSIS — R293 Abnormal posture: Secondary | ICD-10-CM | POA: Diagnosis not present

## 2017-06-30 ENCOUNTER — Other Ambulatory Visit: Payer: Self-pay | Admitting: Internal Medicine

## 2017-07-01 ENCOUNTER — Emergency Department (HOSPITAL_COMMUNITY): Payer: Medicare Other

## 2017-07-01 ENCOUNTER — Encounter (HOSPITAL_COMMUNITY): Payer: Self-pay | Admitting: Emergency Medicine

## 2017-07-01 ENCOUNTER — Emergency Department (HOSPITAL_COMMUNITY)
Admission: EM | Admit: 2017-07-01 | Discharge: 2017-07-01 | Disposition: A | Discharge status: Home / Self Care | Payer: Medicare Other | Attending: Emergency Medicine | Admitting: Emergency Medicine

## 2017-07-01 DIAGNOSIS — R293 Abnormal posture: Secondary | ICD-10-CM | POA: Diagnosis not present

## 2017-07-01 DIAGNOSIS — Z79899 Other long term (current) drug therapy: Secondary | ICD-10-CM | POA: Diagnosis not present

## 2017-07-01 DIAGNOSIS — R296 Repeated falls: Secondary | ICD-10-CM | POA: Diagnosis not present

## 2017-07-01 DIAGNOSIS — T17208A Unspecified foreign body in pharynx causing other injury, initial encounter: Secondary | ICD-10-CM | POA: Diagnosis not present

## 2017-07-01 DIAGNOSIS — R05 Cough: Secondary | ICD-10-CM | POA: Diagnosis not present

## 2017-07-01 DIAGNOSIS — M545 Low back pain: Secondary | ICD-10-CM | POA: Diagnosis not present

## 2017-07-01 DIAGNOSIS — R531 Weakness: Secondary | ICD-10-CM | POA: Diagnosis not present

## 2017-07-01 DIAGNOSIS — G20 Parkinson's disease: Secondary | ICD-10-CM | POA: Diagnosis not present

## 2017-07-01 DIAGNOSIS — R0989 Other specified symptoms and signs involving the circulatory and respiratory systems: Secondary | ICD-10-CM | POA: Diagnosis not present

## 2017-07-01 NOTE — Discharge Instructions (Signed)
Continue home medications as previously prescribed. Follow up with PCP for further evaluation. Return to ED for worsening trouble swallowing, trouble breathing, chest pain, loss of consciousness, lip swelling, signs of severe allergic reaction.

## 2017-07-01 NOTE — ED Triage Notes (Signed)
Pt states she tried to swallow a pill approx. 1 hour ago and feels like it is hung in her throat and she is having trouble breathing. Alert and oriented. Coughing.

## 2017-07-01 NOTE — ED Notes (Signed)
Bed: WA15 Expected date:  Expected time:  Means of arrival:  Comments: Hold for RES B 

## 2017-07-01 NOTE — ED Notes (Signed)
Patient transported to X-ray 

## 2017-07-01 NOTE — ED Notes (Signed)
ED Provider at bedside. 

## 2017-07-01 NOTE — ED Notes (Signed)
Family at bedside. 

## 2017-07-01 NOTE — ED Provider Notes (Signed)
[facility] DEPT Provider Note   CSN: [id number] Arrival date & time: 07/01/17  1539     History   Chief Complaint Chief Complaint  Patient presents with  . Swallowed Foreign Body    HPI Audrey Brown is a 71 y.o. female.  HPI Patient, with past medical history of Parkinson's disease GERD presents to ED for evaluation of foreign body stuck in throat. She states that she was taking her 800 mg of ibuprofen and she feels like it is stuck in her throat, right behind her tonsils. She does have a history of similar symptoms in the past. She is able to drink liquids. She denies any chest pain, vomiting, abdominal pain.  Past Medical History:  Diagnosis Date  . Arthritis    lower back  . Bipolar disorder ([place])   . Depression   . GERD (gastroesophageal reflux disease)   . Hiatal hernia   . Parkinson disease ([place])   . Poor short-term memory   . Sagittal band rupture at metacarpophalangeal joint 11/2016   right long finger    Patient Active Problem List   Diagnosis Date Noted  . Insomnia 06/09/2017  . Chronic mid back pain 08/14/2014  . 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 Abnormality of gait 04/10/2014  . Lumbosacral spondylosis without myelopathy 04/10/2014  . Dysphagia 06/16/2011  . GERD with stricture 06/16/2011  . Bipolar 1 disorder, mixed ([place]) 06/16/2011  . Parkinsonian syndrome ([place]) 06/16/2011  . Closed fracture of lumbar vertebra ([place]) 11/26/2010  . PARKINSONISM 11/13/2010  . TINEA CORPORIS 07/31/2010  . CELLULITIS, LEGS 07/31/2010  . RESTING TREMOR 07/31/2010  . DEPENDENT EDEMA 06/25/2009  . GERD 11/19/2008  . WEAKNESS 11/19/2008  . Depression 01/02/2008  . HEADACHE 01/02/2008  . CHICKENPOX, HX OF 01/02/2008    Past Surgical History:  Procedure Laterality Date  . ANKLE SURGERY    . [place]  . CESAREAN SECTION    . CHOLECYSTECTOMY    . CYST REMOVAL NECK    . ESOPHAGOGASTRODUODENOSCOPY (EGD) WITH ESOPHAGEAL  DILATION  07/27/2012   with Propofol  . ESOPHAGOGASTRODUODENOSCOPY (EGD) WITH PROPOFOL N/A 06/23/2016   Procedure: ESOPHAGOGASTRODUODENOSCOPY (EGD) WITH PROPOFOL;  Surgeon: Jerene Bears, MD;  Location: [facility];  Service: Gastroenterology;  Laterality: N/A;  . FOOT NEUROMA SURGERY Left   . ORIF DISTAL RADIUS FRACTURE Right 07/04/2010  . SAVORY DILATION N/A 06/23/2016   Procedure: SAVORY DILATION;  Surgeon: Jerene Bears, MD;  Location: [facility];  Service: Gastroenterology;  Laterality: N/A;  . TENDON REPAIR Right 12/01/2016   Procedure: right long finger ulnar sagittal band reconstruction;  Surgeon: Leanora Cover, MD;  Location: [facility];  Service: Orthopedics;  Laterality: Right;  . TONSILLECTOMY      OB History    No data available       Home Medications    Prior to Admission medications   Medication Sig Start Date End Date Taking? Authorizing Provider  albuterol (PROVENTIL HFA;VENTOLIN HFA) 108 (90 Base) MCG/ACT inhaler Inhale 2 puffs into the lungs every 6 (six) hours as needed for wheezing or shortness of breath. 05/07/17  Yes Martinique, Betty G, MD  ALPRAZolam Duanne Moron) 0.25 MG tablet Take 1 tablet (0.25 mg total) by mouth 3 (three) times daily as needed for anxiety. 12/22/16  Yes Laurey Morale, MD  AMBULATORY NON FORMULARY MEDICATION Medication Name: Domperidone 10 mg Take 1 tablet by mouth four times daily (before meals and at bedtime) 06/08/17  Yes Pyrtle, Lajuan Lines,  MD  buPROPion (WELLBUTRIN XL) 300 MG 24 hr tablet Take 300 mg by mouth daily.  03/27/16  Yes [provider]  busPIRone (BUSPAR) 10 MG tablet Take 10 mg by mouth 3 (three) times daily.   Yes [provider]  Calcium 500 MG CHEW Chew 500 mg by mouth at bedtime.    Yes [provider]  Eszopiclone 3 MG TABS Take 1 tablet (3 mg total) by mouth at bedtime. Take immediately before bedtime 06/15/17  Yes Laurey Morale, MD  ibuprofen (ADVIL,MOTRIN) 800 MG tablet TAKE 1 TABLET EVERY 8 HOURS  AS NEEDED FOR PAIN 06/25/17  Yes Laurey Morale, MD  LamoTRIgine XR (LAMICTAL XR) 200 MG TB24 Take 1 tablet (200 mg total) by mouth daily. 12/15/13  Yes Laurey Morale, MD  Magnesium 500 MG CAPS Take 500 mg by mouth at bedtime.    Yes [provider]  NEXIUM 40 MG capsule TAKE 1 CAPSULE TWICE A DAY BEFORE MEALS 11/23/16  Yes Pyrtle, Lajuan Lines, MD  OSCIMIN 0.125 MG SUBL PLACE 1 TABLET UNDER THE TONGUE EVERY 6 HOURS AS NEEDED FOR CRAMPING 02/09/17  Yes Pyrtle, Lajuan Lines, MD  oxyCODONE-acetaminophen (PERCOCET) 5-325 MG tablet Take 1 tablet by mouth every 6 (six) hours as needed for severe pain. 1-2 tabs po q6 hours prn pain 04/21/17  Yes Laurey Morale, MD  promethazine (PHENERGAN) 25 MG tablet Take 1 tablet (25 mg total) by mouth every 4 (four) hours as needed for nausea. 04/21/17  Yes Laurey Morale, MD  traMADol (ULTRAM) 50 MG tablet Take 2 tablets (100 mg total) by mouth every 8 (eight) hours as needed for moderate pain. 06/09/17  Yes Laurey Morale, MD  esomeprazole (NEXIUM) 40 MG capsule TAKE 1 CAPSULE DAILY (NEED APPOINTMENT FOR FURTHER REFILLS) Patient not taking: Reported on 07/01/2017 07/01/17   Jerene Bears, MD  promethazine (PHENERGAN) 25 MG tablet Take 1 tablet (25 mg total) by mouth every 4 (four) hours as needed for nausea. Patient not taking: Reported on 06/09/2017 04/21/17   Laurey Morale, MD    Family History Family History  Problem Relation Age of Onset  . Emphysema Mother   . Heart attack Father   . Hypertension Unknown   . Lung cancer Unknown     Social History Social History  Substance Use Topics  . Smoking status: Never Smoker  . Smokeless tobacco: Never Used  . Alcohol use No     Allergies   Penicillins; Celebrex [celecoxib]; Morphine and related; Risperdal [risperidone]; Risperidone and related; and Statins   Review of Systems Review of Systems  Constitutional: Negative for appetite change, chills and fever.  HENT: Negative for ear pain, rhinorrhea, sneezing and sore  throat.   Eyes: Negative for photophobia and visual disturbance.  Respiratory: Positive for cough, choking and shortness of breath. Negative for chest tightness and wheezing.   Cardiovascular: Negative for chest pain and palpitations.  Gastrointestinal: Negative for abdominal pain, blood in stool, constipation, diarrhea, nausea and vomiting.  Genitourinary: Negative for dysuria, hematuria and urgency.  Musculoskeletal: Negative for myalgias.  Skin: Negative for rash.  Neurological: Negative for dizziness, weakness and light-headedness.     Physical Exam Updated Vital Signs BP 127/89 (BP Location: Right Arm)   Pulse 86   Temp (!) 97.5 F (36.4 C) (Oral)   Resp 18   SpO2 100%   Physical Exam  Constitutional: She appears well-developed and well-nourished. No distress.  Coughing. Anxious appearing.  HENT:  Head:  Normocephalic and atraumatic.  Nose: Nose normal.  No foreign body visualized in throat.  Eyes: Conjunctivae and EOM are normal. Right eye exhibits no discharge. Left eye exhibits no discharge. No scleral icterus.  Neck: Normal range of motion. Neck supple.  Cardiovascular: Normal rate, regular rhythm, normal heart sounds and intact distal pulses.  Exam reveals no gallop and no friction rub.   No murmur heard. Pulmonary/Chest: Effort normal and breath sounds normal. No respiratory distress.  Abdominal: Soft. Bowel sounds are normal. She exhibits no distension. There is no tenderness. There is no guarding.  Musculoskeletal: Normal range of motion. She exhibits no edema.  Neurological: She is alert. She exhibits normal muscle tone. Coordination normal.  Skin: Skin is warm and dry. No rash noted.  Psychiatric: She has a normal mood and affect.  Nursing note and vitals reviewed.    ED Treatments / Results  Labs (all labs ordered are listed, but only abnormal results are displayed) Labs Reviewed - No data to display  EKG  EKG Interpretation None        Radiology Dg Neck Soft Tissue  Result Date: 07/01/2017 CLINICAL DATA:  Pill is caught in throat. EXAM: NECK SOFT TISSUES - 1+ VIEW COMPARISON:  None. FINDINGS: There is no evidence of retropharyngeal soft tissue swelling or epiglottic enlargement. The cervical airway is unremarkable and no radio-opaque foreign body identified. IMPRESSION: Negative. Electronically Signed   By: Misty Stanley M.D.   On: 07/01/2017 16:48    Procedures Procedures (including critical care time)  Medications Ordered in ED Medications - No data to display   Initial Impression / Assessment and Plan / ED Course  I have reviewed the triage vital signs and the nursing notes.  Pertinent labs & imaging results that were available during my care of the patient were reviewed by me and considered in my medical decision making (see chart for details).     Patient presents to ED for evaluation of possible foreign body/ingestion. She took her 800mg  ibuprofen and feels like it is stuck in her throat. Able to tolerate liquids. She is speaking in full sentences. SpO2 98-99% on RA. He is coughing profusely and appears anxious. X-ray of the neck was obtained and was negative for swelling or foreign body. Patient given saltine crackers and carbonated drink. She does report much improvement in her symptoms with these measures. Symptoms could be due to esophagitis caused by NSAID use. She is continuing to tolerate liquids and solids. Advised to follow up with PCP for further evaluation. Strict return precautions given.  Patient discussed with and seen by Dr. Leonette Monarch.  Final Clinical Impressions(s) / ED Diagnoses   Final diagnoses:  Foreign body sensation in throat    New Prescriptions Discharge Medication List as of 07/01/2017  6:17 PM       Delia Heady, PA-C 07/01/17 Rice, Grayce Sessions, MD 07/02/17 [phone]

## 2017-07-01 NOTE — ED Notes (Signed)
ED provider at bedside and encouraging pt to eat crackers and drink coke to decrease the irritation

## 2017-07-06 DIAGNOSIS — R531 Weakness: Secondary | ICD-10-CM | POA: Diagnosis not present

## 2017-07-06 DIAGNOSIS — R293 Abnormal posture: Secondary | ICD-10-CM | POA: Diagnosis not present

## 2017-07-06 DIAGNOSIS — M545 Low back pain: Secondary | ICD-10-CM | POA: Diagnosis not present

## 2017-07-06 DIAGNOSIS — R296 Repeated falls: Secondary | ICD-10-CM | POA: Diagnosis not present

## 2017-07-08 DIAGNOSIS — R293 Abnormal posture: Secondary | ICD-10-CM | POA: Diagnosis not present

## 2017-07-08 DIAGNOSIS — M545 Low back pain: Secondary | ICD-10-CM | POA: Diagnosis not present

## 2017-07-08 DIAGNOSIS — R531 Weakness: Secondary | ICD-10-CM | POA: Diagnosis not present

## 2017-07-08 DIAGNOSIS — R296 Repeated falls: Secondary | ICD-10-CM | POA: Diagnosis not present

## 2017-07-20 ENCOUNTER — Ambulatory Visit (INDEPENDENT_AMBULATORY_CARE_PROVIDER_SITE_OTHER): Payer: Medicare Other | Admitting: Family Medicine

## 2017-07-20 ENCOUNTER — Encounter: Payer: Self-pay | Admitting: Family Medicine

## 2017-07-20 VITALS — BP 150/90 | HR 73 | Temp 98.5°F

## 2017-07-20 DIAGNOSIS — N393 Stress incontinence (female) (male): Secondary | ICD-10-CM

## 2017-07-20 DIAGNOSIS — Z23 Encounter for immunization: Secondary | ICD-10-CM | POA: Diagnosis not present

## 2017-07-20 DIAGNOSIS — R05 Cough: Secondary | ICD-10-CM | POA: Diagnosis not present

## 2017-07-20 DIAGNOSIS — R053 Chronic cough: Secondary | ICD-10-CM

## 2017-07-20 MED ORDER — HYDROCODONE-HOMATROPINE 5-1.5 MG/5ML PO SYRP: 5.0000 mL | ORAL_SOLUTION | ORAL | 0 refills | Status: DC | PRN

## 2017-07-20 MED ORDER — TOLTERODINE TARTRATE ER 4 MG PO CP24: 4.0000 mg | ORAL_CAPSULE | Freq: Every day | ORAL | 2 refills | Status: DC

## 2017-07-20 NOTE — Patient Instructions (Signed)
WE NOW OFFER   [facility] FAST TRACK!!!  SAME DAY Appointments for ACUTE CARE  Such as: Sprains, Injuries, cuts, abrasions, rashes, muscle pain, joint pain, back pain Colds, flu, sore throats, headache, allergies, cough, fever  Ear pain, sinus and eye infections Abdominal pain, nausea, vomiting, diarrhea, upset stomach Animal/insect bites  3 Easy Ways to Schedule: Walk-In Scheduling Call in scheduling Mychart Sign-up: [email or web address]         

## 2017-07-20 NOTE — Progress Notes (Signed)
   Subjective:    Patient ID: Audrey Brown, female    DOB: [birth year], 71 y.o.   MRN: [id number]  HPI Here to follow up an ER visit on 07-01-17 and for a hard dry cough. On that day she hurriedly took an 800 mg Ibuprofen tablet and it lodged in the back of her throat. This dissolved and quickly started to burn her throat. She started to cough and has been coughing ever since. This is a non-productive cough. No fever or PND. No wheezing or SOB. Robitussin and Benzonatate have not helped. She is already taking Nexium BID so any GERD should be under control. Also the coughing has caused her to have frequent urinary incontinence.    Review of Systems  Constitutional: Negative.   HENT: Negative.   Eyes: Negative.   Respiratory: Positive for cough. Negative for chest tightness and wheezing.   Cardiovascular: Negative.        Objective:   Physical Exam  Constitutional: She appears well-developed and well-nourished.  She has frequent bouts of hard coughing   HENT:  Right Ear: External ear normal.  Left Ear: External ear normal.  Nose: Nose normal.  Mouth/Throat: Oropharynx is clear and moist.  Eyes: Conjunctivae are normal.  Neck: No thyromegaly present.  Cardiovascular: Normal rate, regular rhythm, normal heart sounds and intact distal pulses.   Pulmonary/Chest: Effort normal and breath sounds normal. No respiratory distress. She has no wheezes. She has no rales.  Abdominal: Soft. Bowel sounds are normal. She exhibits no distension and no mass. There is no tenderness. There is no rebound and no guarding.  Lymphadenopathy:    She has no cervical adenopathy.          Assessment & Plan:  She has a subacute coughing syndrome which was triggered by upper airway irritation from a foreign body. This should resolve over time. We will suppress the cough with Hydromet syrup to allow the upper airways to settle down. Use Detrol LA to control the urine leakage.  Alysia Penna, MD

## 2017-07-20 NOTE — Addendum Note (Signed)
Addended by: Aggie Hacker A on: 07/20/2017 02:26 PM   Modules accepted: Orders

## 2017-07-28 ENCOUNTER — Ambulatory Visit: Payer: Medicare Other

## 2017-07-29 ENCOUNTER — Encounter: Payer: Self-pay | Admitting: Family Medicine

## 2017-08-03 ENCOUNTER — Ambulatory Visit
Admission: RE | Admit: 2017-08-03 | Discharge: 2017-08-03 | Disposition: A | Discharge status: Home / Self Care | Payer: Medicare Other | Source: Ambulatory Visit | Attending: Family Medicine | Admitting: Family Medicine

## 2017-08-03 DIAGNOSIS — Z1231 Encounter for screening mammogram for malignant neoplasm of breast: Secondary | ICD-10-CM

## 2017-08-09 ENCOUNTER — Encounter: Payer: Self-pay | Admitting: Internal Medicine

## 2017-08-09 ENCOUNTER — Ambulatory Visit (INDEPENDENT_AMBULATORY_CARE_PROVIDER_SITE_OTHER): Payer: Medicare Other | Admitting: Internal Medicine

## 2017-08-09 VITALS — BP 150/100 | HR 72 | Ht 63.0 in

## 2017-08-09 DIAGNOSIS — R059 Cough, unspecified: Secondary | ICD-10-CM

## 2017-08-09 DIAGNOSIS — K208 Other esophagitis without bleeding: Secondary | ICD-10-CM

## 2017-08-09 DIAGNOSIS — K219 Gastro-esophageal reflux disease without esophagitis: Secondary | ICD-10-CM | POA: Diagnosis not present

## 2017-08-09 DIAGNOSIS — K580 Irritable bowel syndrome with diarrhea: Secondary | ICD-10-CM

## 2017-08-09 DIAGNOSIS — R1310 Dysphagia, unspecified: Secondary | ICD-10-CM | POA: Diagnosis not present

## 2017-08-09 DIAGNOSIS — G20 Parkinson's disease: Secondary | ICD-10-CM

## 2017-08-09 DIAGNOSIS — K3184 Gastroparesis: Secondary | ICD-10-CM

## 2017-08-09 DIAGNOSIS — R05 Cough: Secondary | ICD-10-CM

## 2017-08-09 DIAGNOSIS — R1319 Other dysphagia: Secondary | ICD-10-CM

## 2017-08-09 MED ORDER — ESOMEPRAZOLE MAGNESIUM 40 MG PO CPDR: DELAYED_RELEASE_CAPSULE | ORAL | 1 refills | Status: DC

## 2017-08-09 MED ORDER — SUCRALFATE 1 GM/10ML PO SUSP: 1.0000 g | Freq: Three times a day (TID) | ORAL | 1 refills | Status: DC

## 2017-08-09 MED ORDER — AMBULATORY NON FORMULARY MEDICATION: 2 refills | Status: DC

## 2017-08-09 NOTE — Progress Notes (Signed)
Subjective:    Patient ID: Audrey Brown, female    DOB: 04-21-1946, 71 y.o.   MRN: [id number]  HPI Audrey Brown is a 71 yo female with PMH of Parkinson's disease, GERD, hiatal hernia, IBS, gastroparesis who is here for follow-up. She is here today with her daughter. She was last seen at the time of endoscopy on 06/23/2016. This was performed for reflux and dysphagia. This showed a normal esophagus and a 4 cm hiatal hernia with small superficial Cameron's erosions. Given response to prior empiric esophageal dilation balloon dilation was performed across the lower esophagus and GE junction to 18 mm. The examined stomach was normal as was the examined duodenum. She does not recall response to dilation but her daughter feels that the dilation helped both her swallowing, coughing associated with eating and also sleep. Her daughter states that she noticed less nocturnal coughing after dilation.  The patient reports about 5 weeks ago she was seen in the ER for acute dysphagia and odynophagia. This occurred after taking an 800 mg ibuprofen and feeling the pill become lodged in the back of her throat/upper esophagus. This was painful and associated with painful swallowing for several weeks thereafter. She reports she took ibuprofen with a small amount of "old coffee"after a physical therapy session. She did not have water at the time to adequately swallow her medication.  Recently she has been experiencing coughing, gagging type sensation with eating and drinking which also affects her breathing. She's been treated with Hydromet for her cough by primary care. Since this incident described above she's had more difficulty with swallowing but no further odynophagia. She is having heartburn which is better but still present on Nexium 40 mg twice a day before meals.  No abdominal pain. She does have loose somewhat urgent stools after her first meal of the day. This is not new. The unpredictability of this can  be frustrating. No blood in her stool or melena.   She has continued domperidone before meals and at bedtime which she finds very helpful.  She had repeat Cologuard performed through the [place] [facility] and this was negative in September 2018  Review of Systems As per HPI, otherwise normal  Current Medications, Allergies, Past Medical History, Past Surgical History, Family History and Social History were reviewed in [employer] record.     Objective:   Physical Exam BP (!) 150/100   Pulse 72   Ht 5\' 3"  (1.6 m)  Constitutional: Well-developed and well-nourished. No distress. HEENT: Normocephalic and atraumatic. Oropharynx is clear and moist. Conjunctivae are normal.  No scleral icterus. Neck: Neck supple. Trachea midline. Cardiovascular: Normal rate, regular rhythm and intact distal pulses. No M/R/G Pulmonary/chest: Effort normal and breath sounds normal. No wheezing, rales or rhonchi. Abdominal: Soft, nontender, nondistended. Bowel sounds active throughout. There are no masses palpable. No hepatosplenomegaly. Extremities: no clubbing, cyanosis, or edema Neurological: Alert and oriented to person place and time. Skin: Skin is warm and dry. Psychiatric: Normal mood and affect. Behavior is normal.  Prior barium esophagram from 2016 reviewed ESOPHOGRAM / BARIUM SWALLOW / BARIUM TABLET STUDY   TECHNIQUE: Combined double contrast and single contrast examination performed using effervescent crystals, thick barium liquid, and thin barium liquid. The patient was observed with fluoroscopy swallowing a 13 mm barium sulphate tablet.   FLUOROSCOPY TIME:  Radiation Exposure Index (as provided by the fluoroscopic device):   If the device does not provide the exposure index:   Fluoroscopy Time:  1  minutes, 20 seconds   Number of Acquired Images:  36   COMPARISON:  None.   FINDINGS: Laryngeal penetration nearly to the cords noted on both swallows, with  somewhat delayed epiglottic inversion. Slightly prominent cricopharyngeus. Solid interbody fusion at C6-7.   The laryngeal penetration elicited a cough which cleared barium coating.   Primary peristaltic waves in the esophagus were intact on 4 out of 4 swallows. No esophageal narrowing. No significant hiatal hernia was observed during today' s exam. On once for a well there were some transient secondary contractions as shown on series 31.   IMPRESSION: 1. Laryngeal penetration, eliciting a cough. No frank tracheal aspiration was observed. 2. No esophageal narrowing.  Primary peristaltic waves were intact.     Electronically Signed   By: Van Clines M.D.   On: 05/02/2015 10:37     Assessment & Plan:  71 yo female with PMH of Parkinson's disease, GERD, hiatal hernia, IBS, gastroparesis who is here for follow-up.  1. GERD/pill esophagitis/concern for aspiration with coughing/dysphagia -- her symptoms are consistent with pill esophagitis which occurred in late August 2018 after taking ibuprofen. She still having some discomfort with swallowing and ongoing dad liquid Carafate before meals, 1 g. She will continue Nexium 40 mg twice a day before meals. I'm referring her for a modified barium swallow with speech and swallow therapy followed by evaluation and treatment thereafter (outpatient neuro-speech [phone]). If she has persistent solid food dysphagia we could consider repeat dilation with EGD. Dilations in the past of been empiric with no visible esophageal stricture. She does have a moderately sized hiatal hernia.  2. Gastroparesis with IBS -- we'll continue domperidone 10 mg 3 times a day before meals and at bedtime. I'm going to have her hold her bedtime dose of domperidone to see if her morning loose stools improved. If she is waking up with nausea she can resume the bedtime dose. Continue Levsin when necessary for abdominal cramping and spasm  3. CRC screening -- up-to-date  with recently negative Cologuard; repeat in 3 years  25 minutes spent with the patient today. Greater than 50% was spent in counseling and coordination of care with the patient

## 2017-08-09 NOTE — Patient Instructions (Addendum)
You have been scheduled for a modified barium swallow on Monday, 08/16/17 at 1:00 pm. Please arrive 15 minutes prior to your test for registration. You will go to [facility] (1st Floor) for your appointment. Please refrain from eating or drinking anything 4 hours prior to your test. Should you need to cancel or reschedule your appointment, please contact [phone] [facility]) or [phone] [place]). _____________________________________________________________________ A Modified Barium Swallow Study, or MBS, is a special x-ray that is taken to check swallowing skills. It is carried out by a Stage manager and a Psychologist, clinical (SLP). During this test, yourmouth, throat, and esophagus, a muscular tube which connects your mouth to your stomach, is checked. The test will help you, your doctor, and the SLP plan what types of foods and liquids are easier for you to swallow. The SLP will also identify positions and ways to help you swallow more easily and safely. What will happen during an MBS? You will be taken to an x-ray room and seated comfortably. You will be asked to swallow small amounts of food and liquid mixed with barium. Barium is a liquid or paste that allows images of your mouth, throat and esophagus to be seen on x-ray. The x-ray captures moving images of the food you are swallowing as it travels from your mouth through your throat and into your esophagus. This test helps identify whether food or liquid is entering your lungs (aspiration). The test also shows which part of your mouth or throat lacks strength or coordination to move the food or liquid in the right direction. This test typically takes 30 minutes to 1 hour to complete. _______________________________________________________________________  We have sent the following prescriptions to your mail in pharmacy: Nexium 40 mg twice daily  If you have not heard from your mail in pharmacy within 1 week or if  you have not received your medication in the mail, please contact us at [phone] so we may find out why.  We have sent the following medications to your pharmacy for you to pick up at your convenience: carafate slurry-1 g three times daily before meals.  We will refill domperidone-please try to hold your night dose to see if it helps with loose stool.  If you are age 66 or older, your body mass index should be between 23-30. Your There is no height or weight on file to calculate BMI. If this is out of the aforementioned range listed, please consider follow up with your Primary Care Provider.  If you are age 34 or younger, your body mass index should be between 19-25. Your There is no height or weight on file to calculate BMI. If this is out of the aformentioned range listed, please consider follow up with your Primary Care Provider.

## 2017-08-10 ENCOUNTER — Telehealth: Payer: Self-pay | Admitting: Internal Medicine

## 2017-08-10 NOTE — Telephone Encounter (Signed)
Rx has been faxed earlier today.

## 2017-08-11 ENCOUNTER — Other Ambulatory Visit (HOSPITAL_COMMUNITY): Payer: Self-pay | Admitting: Internal Medicine

## 2017-08-11 DIAGNOSIS — R1310 Dysphagia, unspecified: Secondary | ICD-10-CM

## 2017-08-16 ENCOUNTER — Ambulatory Visit (HOSPITAL_COMMUNITY)
Admission: RE | Admit: 2017-08-16 | Discharge: 2017-08-16 | Disposition: A | Discharge status: Home / Self Care | Payer: Medicare Other | Source: Ambulatory Visit | Attending: Internal Medicine | Admitting: Internal Medicine

## 2017-08-16 DIAGNOSIS — R1310 Dysphagia, unspecified: Secondary | ICD-10-CM | POA: Diagnosis not present

## 2017-08-16 DIAGNOSIS — G20 Parkinson's disease: Secondary | ICD-10-CM | POA: Insufficient documentation

## 2017-08-16 DIAGNOSIS — R05 Cough: Secondary | ICD-10-CM | POA: Diagnosis not present

## 2017-08-16 DIAGNOSIS — R059 Cough, unspecified: Secondary | ICD-10-CM

## 2017-08-24 ENCOUNTER — Other Ambulatory Visit: Payer: Self-pay

## 2017-08-24 DIAGNOSIS — R1310 Dysphagia, unspecified: Secondary | ICD-10-CM

## 2017-09-10 ENCOUNTER — Ambulatory Visit (INDEPENDENT_AMBULATORY_CARE_PROVIDER_SITE_OTHER)
Admission: RE | Admit: 2017-09-10 | Discharge: 2017-09-10 | Disposition: A | Discharge status: Home / Self Care | Payer: Medicare Other | Source: Ambulatory Visit | Attending: Internal Medicine | Admitting: Internal Medicine

## 2017-09-10 ENCOUNTER — Encounter: Payer: Self-pay | Admitting: Internal Medicine

## 2017-09-10 ENCOUNTER — Ambulatory Visit (INDEPENDENT_AMBULATORY_CARE_PROVIDER_SITE_OTHER): Payer: Medicare Other | Admitting: Internal Medicine

## 2017-09-10 VITALS — BP 142/78 | HR 85 | Temp 98.4°F | Wt 170.0 lb

## 2017-09-10 DIAGNOSIS — R509 Fever, unspecified: Secondary | ICD-10-CM | POA: Diagnosis not present

## 2017-09-10 DIAGNOSIS — R05 Cough: Secondary | ICD-10-CM

## 2017-09-10 DIAGNOSIS — R059 Cough, unspecified: Secondary | ICD-10-CM

## 2017-09-10 MED ORDER — HYDROCODONE-HOMATROPINE 5-1.5 MG/5ML PO SYRP: 5.0000 mL | ORAL_SOLUTION | ORAL | 0 refills | Status: DC | PRN

## 2017-09-10 NOTE — Progress Notes (Signed)
Chief Complaint  Patient presents with  . Acute Visit    Sore throat, cough, fever, chills, body aches, nausea and terrible headache. Mucus is green tinged. Cough started about 1 week ago. Pt having wet cough with SOB. Pt waking throughout night and vomiting. Ibuprofen for body aches and Hydromet cough syrup.  Pt recently exposed to family member with Flu and bronchitis    HPI: Audrey Brown 71 y.o. sda PCP NA no history of underlying lung disease but does get bronchitis seasonally and had pneumonia left sided last year. She has had a cough for about a week and then developed a low-grade fever in the 100 range.  Not a lot of sinus congestion but feels short of breath with her coughing spasms and hoarseness.  No hemoptysis chills. See above. Use cough medicine in the past is almost out. Has a history of cough from reflux but this is totally different. She is here with attendant today she is a non-smoker but her attendant smokes in the car otherwise no exposures. ROS: See pertinent positives and negatives per HPI.  Past Medical History:  Diagnosis Date  . Arthritis    lower back  . Bipolar disorder ([place])   . Depression   . GERD (gastroesophageal reflux disease)   . Hiatal hernia   . Parkinson disease ([place])   . Poor short-term memory   . Sagittal band rupture at metacarpophalangeal joint 11/2016   right long finger    Family History  Problem Relation Age of Onset  . Emphysema Mother   . Heart attack Father   . Hypertension Unknown   . Lung cancer Unknown     Social History   Social History  . Marital status: Widowed    Spouse name: N/A  . Number of children: 4  . Years of education: N/A   Occupational History  . Retired Retired   Social History Main Topics  . Smoking status: Never Smoker  . Smokeless tobacco: Never Used  . Alcohol use No  . Drug use: No  . Sexual activity: Not Asked   Other Topics Concern  . None   Social History Narrative   Daughter is  her POA and must come to all visits in GI and [facility]     Outpatient Medications Prior to Visit  Medication Sig Dispense Refill  . ALPRAZolam (XANAX) 0.25 MG tablet Take 1 tablet (0.25 mg total) by mouth 3 (three) times daily as needed for anxiety. 90 tablet 2  . AMBULATORY NON FORMULARY MEDICATION Medication Name: Domperidone 10 mg Take 1 tablet by mouth four times daily (before meals and at bedtime) 360 tablet 2  . buPROPion (WELLBUTRIN XL) 300 MG 24 hr tablet Take 300 mg by mouth daily.     . busPIRone (BUSPAR) 10 MG tablet Take 10 mg by mouth 3 (three) times daily.    . Calcium 500 MG CHEW Chew 500 mg by mouth at bedtime.     Marland Kitchen esomeprazole (NEXIUM) 40 MG capsule TAKE 1 CAPSULE TWICE A DAY BEFORE MEALS 180 capsule 1  . Eszopiclone 3 MG TABS Take 1 tablet (3 mg total) by mouth at bedtime. Take immediately before bedtime 90 tablet 1  . ibuprofen (ADVIL,MOTRIN) 800 MG tablet TAKE 1 TABLET EVERY 8 HOURS AS NEEDED FOR PAIN 270 tablet 3  . LamoTRIgine XR (LAMICTAL XR) 200 MG TB24 Take 1 tablet (200 mg total) by mouth daily. 90 tablet 1  . Magnesium 500 MG CAPS Take 500 mg by  mouth at bedtime.     . OSCIMIN 0.125 MG SUBL PLACE 1 TABLET UNDER THE TONGUE EVERY 6 HOURS AS NEEDED FOR CRAMPING 360 each 0  . oxyCODONE-acetaminophen (PERCOCET) 5-325 MG tablet Take 1 tablet by mouth every 6 (six) hours as needed for severe pain. 1-2 tabs po q6 hours prn pain 60 tablet 0  . promethazine (PHENERGAN) 25 MG tablet Take 1 tablet (25 mg total) by mouth every 4 (four) hours as needed for nausea. 180 tablet 2  . sucralfate (CARAFATE) 1 GM/10ML suspension Take 10 mLs (1 g total) by mouth 3 (three) times daily before meals. 1000 mL 1  . tolterodine (DETROL LA) 4 MG 24 hr capsule Take 1 capsule (4 mg total) by mouth daily. 30 capsule 2  . traMADol (ULTRAM) 50 MG tablet Take 2 tablets (100 mg total) by mouth every 8 (eight) hours as needed for moderate pain. 60 tablet 2  . HYDROcodone-homatropine (HYDROMET) 5-1.5 MG/5ML  syrup Take 5 mLs by mouth every 4 (four) hours as needed. 240 mL 0   No facility-administered medications prior to visit.      EXAM:  BP (!) 142/78 (BP Location: Left Arm, Patient Position: Sitting, Cuff Size: Normal)   Pulse 85   Temp 98.4 F (36.9 C) (Oral)   Wt 170 lb (77.1 kg)   BMI 30.11 kg/m   Body mass index is 30.11 kg/m.  GENERAL: vitals reviewed and listed above, alert, oriented, appears well hydrated and in no acute distress she is modestly hoarse in a wheelchair has coughing spasms but is nontoxic and quite respirations without stridor otherwise.  HEENT: atraumatic, conjunctiva  clear, no obvious abnormalities on inspection of external nose and ears face is nontender some wax in right ear clear in the left OP : no lesion edema or exudate +2 redness no specific lesion good airway NECK: no obvious masses on inspection palpation  LUNGS: clear to auscultation bilaterally, no wheezes, rales or rhonchi, not taking real deep breaths but no obvious abnormal sounds.  CV: HRRR, no clubbing cyanosis or  peripheral edema nl cap refill   Alert normal speech but mildly hoarse cooperative, no obvious depression or anxiety  ASSESSMENT AND PLAN:  Discussed the following assessment and plan:  Cough with fever - Plan: DG Chest 2 View Probably a viral respiratory infection however because of the scenario check chest x-ray today close observation for complications.  Symptomatic treatment and follow-up with PCP as indicated. Avoid ETS.  Caution with cough medicine she does not take as needed medicines combined reviewed such as tramadol and Xanax. -Patient advised to return or notify health care team  if symptoms worsen ,persist or new concerns arise.  Patient Instructions  Get x ray  Concern about pneumonia developing     Will send in med if  pna  Seen  .  Cough med for comfort with cuation . 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  Urania Pearlman M.D.

## 2017-09-10 NOTE — Patient Instructions (Signed)
Get x ray  Concern about pneumonia developing     Will send in med if  pna  Seen  .  Cough med for comfort with cuation . 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

## 2017-09-28 ENCOUNTER — Telehealth: Payer: Self-pay | Admitting: Family Medicine

## 2017-09-28 NOTE — Telephone Encounter (Signed)
Sent to PCP for approval.  

## 2017-09-28 NOTE — Telephone Encounter (Signed)
Copied from [place] [id number]. Topic: Quick Communication - See Telephone Encounter >> Sep 28, 2017  4:17 PM Cleaster Corin, NT wrote: CRM for notification. See Telephone encounter for:   09/28/17. Pt. Called and wanted to speak with Dr. Sarajane Jews or nurse about a medication. Asked pt. Which med. Pt. Said she will talk about that with the doctor or nurse. Please give pt. A call at [phone]

## 2017-09-29 MED ORDER — ESZOPICLONE 3 MG PO TABS: 3.0000 mg | ORAL_TABLET | Freq: Every day | ORAL | 1 refills | Status: DC

## 2017-09-29 NOTE — Telephone Encounter (Signed)
I understand. [facility]

## 2017-09-29 NOTE — Telephone Encounter (Signed)
Pt advised Rx was faxed over to [facility].

## 2017-09-29 NOTE — Telephone Encounter (Signed)
Please find out what this is about 

## 2017-09-29 NOTE — Telephone Encounter (Signed)
Pt's insurance is messed up pt stated. They will only pay for Rx's that she takes on a regular bases must be sent to [facility] Rx's are at [facility]. Please sent in an Rx for Lunata/ Eszopiclone to [facility]. Rx was sent to [facility] and insurance will NOT cover. Sent to the PCP for the OK to resent Rx to [facility] for pt?

## 2017-10-22 ENCOUNTER — Other Ambulatory Visit: Payer: Self-pay

## 2017-10-22 ENCOUNTER — Encounter (HOSPITAL_COMMUNITY): Payer: Self-pay | Admitting: Emergency Medicine

## 2017-10-29 ENCOUNTER — Encounter (HOSPITAL_COMMUNITY): Payer: Self-pay | Admitting: *Deleted

## 2017-10-29 ENCOUNTER — Ambulatory Visit (HOSPITAL_COMMUNITY)
Admission: RE | Admit: 2017-10-29 | Discharge: 2017-10-29 | Disposition: A | Discharge status: Home / Self Care | Payer: Medicare Other | Source: Ambulatory Visit | Attending: Internal Medicine | Admitting: Internal Medicine

## 2017-10-29 ENCOUNTER — Ambulatory Visit (HOSPITAL_COMMUNITY): Payer: Medicare Other | Admitting: Anesthesiology

## 2017-10-29 ENCOUNTER — Encounter (HOSPITAL_COMMUNITY)
Admission: RE | Disposition: A | Discharge status: Home / Self Care | Payer: Self-pay | Source: Ambulatory Visit | Attending: Internal Medicine

## 2017-10-29 ENCOUNTER — Other Ambulatory Visit: Payer: Self-pay

## 2017-10-29 DIAGNOSIS — Z79899 Other long term (current) drug therapy: Secondary | ICD-10-CM | POA: Insufficient documentation

## 2017-10-29 DIAGNOSIS — Z791 Long term (current) use of non-steroidal anti-inflammatories (NSAID): Secondary | ICD-10-CM | POA: Insufficient documentation

## 2017-10-29 DIAGNOSIS — Z79891 Long term (current) use of opiate analgesic: Secondary | ICD-10-CM | POA: Diagnosis not present

## 2017-10-29 DIAGNOSIS — F319 Bipolar disorder, unspecified: Secondary | ICD-10-CM | POA: Diagnosis not present

## 2017-10-29 DIAGNOSIS — R1310 Dysphagia, unspecified: Secondary | ICD-10-CM | POA: Diagnosis not present

## 2017-10-29 DIAGNOSIS — K560 Paralytic ileus: Secondary | ICD-10-CM | POA: Diagnosis not present

## 2017-10-29 DIAGNOSIS — Z9049 Acquired absence of other specified parts of digestive tract: Secondary | ICD-10-CM | POA: Diagnosis not present

## 2017-10-29 DIAGNOSIS — K219 Gastro-esophageal reflux disease without esophagitis: Secondary | ICD-10-CM | POA: Insufficient documentation

## 2017-10-29 DIAGNOSIS — K3184 Gastroparesis: Secondary | ICD-10-CM | POA: Insufficient documentation

## 2017-10-29 DIAGNOSIS — K449 Diaphragmatic hernia without obstruction or gangrene: Secondary | ICD-10-CM

## 2017-10-29 DIAGNOSIS — G20 Parkinson's disease: Secondary | ICD-10-CM | POA: Diagnosis not present

## 2017-10-29 HISTORY — PX: ESOPHAGOGASTRODUODENOSCOPY (EGD) WITH PROPOFOL: SHX5813

## 2017-10-29 HISTORY — PX: BALLOON DILATION: SHX5330

## 2017-10-29 SURGERY — ESOPHAGOGASTRODUODENOSCOPY (EGD) WITH PROPOFOL
Anesthesia: Monitor Anesthesia Care

## 2017-10-29 MED ORDER — PROPOFOL 500 MG/50ML IV EMUL
INTRAVENOUS | Status: DC | PRN
  Administered 2017-10-29: 30 mg via INTRAVENOUS

## 2017-10-29 MED ORDER — PROPOFOL 500 MG/50ML IV EMUL
INTRAVENOUS | Status: DC | PRN
  Administered 2017-10-29: 125 ug/kg/min via INTRAVENOUS

## 2017-10-29 MED ORDER — SODIUM CHLORIDE 0.9 % IV SOLN: INTRAVENOUS | Status: DC

## 2017-10-29 MED ORDER — ONDANSETRON HCL 4 MG/2ML IJ SOLN
INTRAMUSCULAR | Status: DC | PRN
  Administered 2017-10-29: 4 mg via INTRAVENOUS

## 2017-10-29 MED ORDER — PROPOFOL 10 MG/ML IV BOLUS
INTRAVENOUS | Status: AC
  Filled 2017-10-29: qty 40

## 2017-10-29 MED ORDER — LACTATED RINGERS IV SOLN
INTRAVENOUS | Status: DC
  Administered 2017-10-29: 1000 mL via INTRAVENOUS

## 2017-10-29 SURGICAL SUPPLY — 14 items

## 2017-10-29 NOTE — Anesthesia Procedure Notes (Signed)
Date/Time: 10/29/2017 10:39 AM Performed by: Glory Buff, CRNA Oxygen Delivery Method: Nasal cannula

## 2017-10-29 NOTE — Discharge Instructions (Signed)

## 2017-10-29 NOTE — H&P (Signed)
HPI: Audrey Brown is a 71 year old female with a history of GERD with hiatal hernia, history of multifactorial dysphagia but previously responsive to esophageal balloon dilation, history of gastroparesis, significant Parkinson's disease who presents for outpatient upper endoscopy with dilation.  She is here today with her daughter.  She was seen in the office on 08/09/2017.  She had an episode approximately 3 months ago of pill-induced esophagitis likely related to ibuprofen.  This has improved.  She has had recurrent issues with solid food dysphagia.  She was seen by speech and swallow pathology.  She does have a mild aspiration risk.  No recent pneumonias.  She had upper endoscopy with dilatation on 06/23/2016.  Balloon dilation was performed in the lower esophagus across the GE junction 18 mm.  She reports after this her dysphagia improved significantly for over a year.  At that time she was found to have a 4 cm hiatal hernia with superficial Cameron's erosions.  She denies chest pain, shortness of breath today.  No abdominal pain.  Past Medical History:  Diagnosis Date  . Arthritis    lower back  . Bipolar disorder ([place])   . Depression   . GERD (gastroesophageal reflux disease)   . Hiatal hernia   . Parkinson disease ([place])   . Poor short-term memory   . Sagittal band rupture at metacarpophalangeal joint 11/2016   right long finger    Past Surgical History:  Procedure Laterality Date  . ANKLE SURGERY    . [place]  . CESAREAN SECTION    . CHOLECYSTECTOMY    . CYST REMOVAL NECK    . ESOPHAGOGASTRODUODENOSCOPY (EGD) WITH ESOPHAGEAL DILATION  07/27/2012   with Propofol  . ESOPHAGOGASTRODUODENOSCOPY (EGD) WITH PROPOFOL N/A 06/23/2016   Procedure: ESOPHAGOGASTRODUODENOSCOPY (EGD) WITH PROPOFOL;  Surgeon: Jerene Bears, MD;  Location: [facility];  Service: Gastroenterology;  Laterality: N/A;  . FOOT NEUROMA SURGERY Left   . ORIF DISTAL RADIUS FRACTURE Right  07/04/2010  . SAVORY DILATION N/A 06/23/2016   Procedure: SAVORY DILATION;  Surgeon: Jerene Bears, MD;  Location: [facility];  Service: Gastroenterology;  Laterality: N/A;  . TENDON REPAIR Right 12/01/2016   Procedure: right long finger ulnar sagittal band reconstruction;  Surgeon: Leanora Cover, MD;  Location: [place];  Service: Orthopedics;  Laterality: Right;  . TONSILLECTOMY       (Not in an outpatient encounter)  Allergies  Allergen Reactions  . Penicillins Hives, Nausea Only and Other (See Comments)    Has patient had a PCN reaction causing immediate rash, facial/tongue/throat swelling, SOB or lightheadedness with hypotension: Yes Has patient had a PCN reaction causing severe rash involving mucus membranes or skin necrosis: Yes Has patient had a PCN reaction that required hospitalization: Yes, was already in the hospital  Has patient had a PCN reaction occurring within the last 10 years: No If all of the above answers are "NO", then may proceed with Cephalosporin use.   . Celebrex [Celecoxib] Hives  . Morphine And Related Hives  . Risperdal [Risperidone] Other (See Comments)    TREMORS  . Risperidone And Related Other (See Comments)    SEIZURES  . Statins Other (See Comments)    MUSCLE ACHES    Family History  Problem Relation Age of Onset  . Emphysema Mother   . Heart attack Father   . Hypertension Unknown   . Lung cancer Unknown     Social History   Tobacco Use  . Smoking status:  Never Smoker  . Smokeless tobacco: Never Used  Substance Use Topics  . Alcohol use: No    Alcohol/week: 0.0 oz  . Drug use: No    ROS: As per history of present illness, otherwise negative  BP (!) 159/71   Pulse 78   Temp 98.2 F (36.8 C) (Oral)   Resp 20   Ht 5\' 3"  (1.6 m)   Wt 170 lb (77.1 kg)   SpO2 95%   BMI 30.11 kg/m  Gen: awake, alert, NAD HEENT: anicteric, op clear CV: RRR, no mrg Pulm: CTA b/l Abd: soft, NT/ND, +BS throughout Ext: no  c/c/e Neuro: nonfocal   RELEVANT LABS AND IMAGING: CBC    Component Value Date/Time   WBC 8.4 04/20/2017 0518   RBC 4.19 04/20/2017 0518   HGB 12.4 04/20/2017 0518   HCT 37.4 04/20/2017 0518   PLT 291 04/20/2017 0518   MCV 89.3 04/20/2017 0518   MCH 29.6 04/20/2017 0518   MCHC 33.2 04/20/2017 0518   RDW 13.1 04/20/2017 0518   LYMPHSABS 3.8 04/20/2017 0518   MONOABS 0.7 04/20/2017 0518   EOSABS 0.4 04/20/2017 0518   BASOSABS 0.1 04/20/2017 0518    CMP     Component Value Date/Time   NA 144 04/20/2017 0518   K 3.7 04/20/2017 0518   CL 109 04/20/2017 0518   CO2 27 04/20/2017 0518   GLUCOSE 99 04/20/2017 0518   BUN 19 04/20/2017 0518   CREATININE 0.74 04/20/2017 0518   CALCIUM 9.1 04/20/2017 0518   PROT 6.9 12/25/2015 0936   ALBUMIN 4.2 12/25/2015 0936   AST 13 12/25/2015 0936   ALT 12 12/25/2015 0936   ALKPHOS 56 12/25/2015 0936   BILITOT 0.5 12/25/2015 0936   GFRNONAA >60 04/20/2017 0518   GFRAA >60 04/20/2017 0518    ASSESSMENT/PLAN: 71 year old female with a history of GERD with hiatal hernia, history of multifactorial dysphagia but previously responsive to esophageal balloon dilation, history of gastroparesis, significant Parkinson's disease who presents for outpatient upper endoscopy with dilation.    1.  GERD/dysphagia --plan repeat upper endoscopy for probable dilation today given intermittent dysphagia symptom.  She did improve after dilation in August 2017.The nature of the procedure, as well as the risks, benefits, and alternatives were carefully and thoroughly reviewed with the patient. Ample time for discussion and questions allowed. The patient understood, was satisfied, and agreed to proceed.

## 2017-10-29 NOTE — Op Note (Signed)
[facility] Patient Name: Audrey Brown Procedure Date: 10/29/2017 MRN: [id number] Attending MD: Jerene Bears , MD Date of Birth: 04-07-1946 CSN: [id number] Age: 71 Admit Type: Outpatient Procedure:                Upper GI endoscopy Indications:              Dysphagia Providers:                Lajuan Lines. Hilarie Fredrickson, MD, Zenon Mayo, RN, Tinnie Gens,                            Technician, Rosario Adie, CRNA Referring MD:              Medicines:                Monitored Anesthesia Care Complications:            No immediate complications. Estimated Blood Loss:     Estimated blood loss: none. Procedure:                Pre-Anesthesia Assessment:                           - Prior to the procedure, a History and Physical                            was performed, and patient medications and                            allergies were reviewed. The patient's tolerance of                            previous anesthesia was also reviewed. The risks                            and benefits of the procedure and the sedation                            options and risks were discussed with the patient.                            All questions were answered, and informed consent                            was obtained. Prior Anticoagulants: The patient has                            taken no previous anticoagulant or antiplatelet                            agents. ASA Grade Assessment: III - A patient with                            severe systemic disease. After reviewing the risks  and benefits, the patient was deemed in                            satisfactory condition to undergo the procedure.                           After obtaining informed consent, the endoscope was                            passed under direct vision. Throughout the                            procedure, the patient's blood pressure, pulse, and                            oxygen  saturations were monitored continuously. The                            EG-[id number] ([id number]) scope was introduced through the                            mouth, and advanced to the second part of duodenum.                            The upper GI endoscopy was accomplished without                            difficulty. The patient tolerated the procedure                            well. Scope In: Scope Out: Findings:      Normal mucosa was found in the entire esophagus.      A 4 cm hiatal hernia was present.      No endoscopic abnormality was evident in the esophagus to explain the       patient's complaint of dysphagia. It was decided, however, given       previous response to prior dilation to proceed with dilation in the       distal esophagus across the lower esophageal sphincter. A TTS dilator       was passed through the scope. Dilation with a 15-16.5-18 mm balloon       dilator was performed to 18 mm. Once inflated the balloon was carefully       and slowly pulled into the proximal esophagus.      The entire examined stomach was normal.      The examined duodenum was normal. Impression:               - Normal mucosa was found in the entire esophagus.                           - 4 cm hiatal hernia.                           - Esophageal dilation to 18 mm.                           -  Normal stomach.                           - Normal examined duodenum.                           - No specimens collected. Moderate Sedation:      N/A Recommendation:           - Patient has a contact number available for                            emergencies. The signs and symptoms of potential                            delayed complications were discussed with the                            patient. Return to normal activities tomorrow.                            Written discharge instructions were provided to the                            patient.                           - Resume previous  diet.                           - Continue present medications.                           - Repeat upper endoscopy PRN for retreatment. Procedure Code(s):        --- Professional ---                           [id number], Esophagogastroduodenoscopy, flexible,                            transoral; with transendoscopic balloon dilation of                            esophagus (less than 30 mm diameter) Diagnosis Code(s):        --- Professional ---                           K44.9, Diaphragmatic hernia without obstruction or                            gangrene                           R13.10, Dysphagia, unspecified CPT copyright 2016 [employer]. All rights reserved. The codes documented in this report are preliminary and upon coder review may  be revised to meet current compliance requirements. Jerene Bears, MD 10/29/2017 11:08:21 AM This report has been signed electronically. Number of Addenda: 0

## 2017-10-29 NOTE — Anesthesia Postprocedure Evaluation (Signed)
Anesthesia Post Note  Patient: Audrey Brown  Procedure(s) Performed: ESOPHAGOGASTRODUODENOSCOPY (EGD) WITH PROPOFOL (N/A ) BALLOON DILATION (N/A )     Patient location during evaluation: Endoscopy Anesthesia Type: MAC Level of consciousness: awake Pain management: pain level controlled Vital Signs Assessment: post-procedure vital signs reviewed and stable Respiratory status: spontaneous breathing Cardiovascular status: stable Postop Assessment: no apparent nausea or vomiting Anesthetic complications: no    Last Vitals:  Vitals:   10/29/17 0936 10/29/17 1106  BP: (!) 159/71 (!) 159/79  Pulse: 78 81  Resp: 20 18  Temp: 36.8 C 36.6 C  SpO2: 95% 97%    Last Pain:  Vitals:   10/29/17 1106  TempSrc: Oral   Pain Goal:                 Shelden Raborn JR,JOHN Luiz Trumpower

## 2017-10-29 NOTE — Anesthesia Preprocedure Evaluation (Addendum)
Anesthesia Evaluation  Patient identified by MRN, date of birth, ID band Patient awake    Reviewed: Allergy & Precautions, NPO status , Patient's Chart, lab work & pertinent test results  Airway Mallampati: I       Dental no notable dental hx. (+) Teeth Intact   Pulmonary neg pulmonary ROS,    Pulmonary exam normal breath sounds clear to auscultation       Cardiovascular negative cardio ROS Normal cardiovascular exam Rhythm:Regular Rate:Normal     Neuro/Psych    GI/Hepatic Neg liver ROS, GERD  Medicated and Controlled,  Endo/Other  negative endocrine ROS  Renal/GU negative Renal ROS  negative genitourinary   Musculoskeletal   Abdominal Normal abdominal exam  (+)   Peds  Hematology negative hematology ROS (+)   Anesthesia Other Findings   Reproductive/Obstetrics                             Anesthesia Physical Anesthesia Plan  ASA: II  Anesthesia Plan: MAC   Post-op Pain Management:    Induction:   PONV Risk Score and Plan: 2 and Ondansetron and Dexamethasone  Airway Management Planned: Natural Airway and Mask  Additional Equipment:   Intra-op Plan:   Post-operative Plan:   Informed Consent: I have reviewed the patients History and Physical, chart, labs and discussed the procedure including the risks, benefits and alternatives for the proposed anesthesia with the patient or authorized representative who has indicated his/her understanding and acceptance.     Plan Discussed with: CRNA and Surgeon  Anesthesia Plan Comments:         Anesthesia Quick Evaluation

## 2017-10-29 NOTE — Transfer of Care (Signed)
Immediate Anesthesia Transfer of Care Note  Patient: Audrey Brown  Procedure(s) Performed: ESOPHAGOGASTRODUODENOSCOPY (EGD) WITH PROPOFOL (N/A ) BALLOON DILATION (N/A )  Patient Location: PACU  Anesthesia Type:MAC  Level of Consciousness: awake, alert  and oriented  Airway & Oxygen Therapy: Patient Spontanous Breathing and Patient connected to nasal cannula oxygen  Post-op Assessment: Report given to RN and Post -op Vital signs reviewed and stable  Post vital signs: Reviewed and stable  Last Vitals:  Vitals:   10/29/17 0936  BP: (!) 159/71  Pulse: 78  Resp: 20  Temp: 36.8 C  SpO2: 95%    Last Pain:  Vitals:   10/29/17 0936  TempSrc: Oral         Complications: No apparent anesthesia complications

## 2017-10-31 ENCOUNTER — Encounter (HOSPITAL_COMMUNITY): Payer: Self-pay | Admitting: Internal Medicine

## 2017-11-03 ENCOUNTER — Encounter (HOSPITAL_COMMUNITY): Payer: Self-pay

## 2017-11-03 ENCOUNTER — Emergency Department (HOSPITAL_COMMUNITY)
Admission: EM | Admit: 2017-11-03 | Discharge: 2017-11-03 | Disposition: A | Discharge status: Home / Self Care | Payer: Medicare Other | Attending: Emergency Medicine | Admitting: Emergency Medicine

## 2017-11-03 ENCOUNTER — Emergency Department (HOSPITAL_COMMUNITY): Payer: Medicare Other

## 2017-11-03 DIAGNOSIS — Z79899 Other long term (current) drug therapy: Secondary | ICD-10-CM | POA: Insufficient documentation

## 2017-11-03 DIAGNOSIS — S6991XA Unspecified injury of right wrist, hand and finger(s), initial encounter: Secondary | ICD-10-CM | POA: Diagnosis not present

## 2017-11-03 DIAGNOSIS — R079 Chest pain, unspecified: Secondary | ICD-10-CM | POA: Diagnosis not present

## 2017-11-03 DIAGNOSIS — W1839XA Other fall on same level, initial encounter: Secondary | ICD-10-CM | POA: Insufficient documentation

## 2017-11-03 DIAGNOSIS — S20212A Contusion of left front wall of thorax, initial encounter: Secondary | ICD-10-CM | POA: Diagnosis not present

## 2017-11-03 DIAGNOSIS — T148XXA Other injury of unspecified body region, initial encounter: Secondary | ICD-10-CM | POA: Diagnosis not present

## 2017-11-03 DIAGNOSIS — G20 Parkinson's disease: Secondary | ICD-10-CM | POA: Insufficient documentation

## 2017-11-03 DIAGNOSIS — Y9389 Activity, other specified: Secondary | ICD-10-CM | POA: Insufficient documentation

## 2017-11-03 DIAGNOSIS — W19XXXA Unspecified fall, initial encounter: Secondary | ICD-10-CM

## 2017-11-03 DIAGNOSIS — M546 Pain in thoracic spine: Secondary | ICD-10-CM | POA: Diagnosis not present

## 2017-11-03 DIAGNOSIS — Y999 Unspecified external cause status: Secondary | ICD-10-CM | POA: Insufficient documentation

## 2017-11-03 DIAGNOSIS — M79641 Pain in right hand: Secondary | ICD-10-CM | POA: Diagnosis not present

## 2017-11-03 DIAGNOSIS — M25512 Pain in left shoulder: Secondary | ICD-10-CM | POA: Diagnosis not present

## 2017-11-03 DIAGNOSIS — F0390 Unspecified dementia without behavioral disturbance: Secondary | ICD-10-CM | POA: Insufficient documentation

## 2017-11-03 DIAGNOSIS — Y929 Unspecified place or not applicable: Secondary | ICD-10-CM | POA: Diagnosis not present

## 2017-11-03 DIAGNOSIS — S299XXA Unspecified injury of thorax, initial encounter: Secondary | ICD-10-CM | POA: Diagnosis present

## 2017-11-03 DIAGNOSIS — S4992XA Unspecified injury of left shoulder and upper arm, initial encounter: Secondary | ICD-10-CM | POA: Diagnosis not present

## 2017-11-03 MED ORDER — ONDANSETRON HCL 4 MG/2ML IJ SOLN
4.0000 mg | Freq: Once | INTRAMUSCULAR | Status: AC
  Administered 2017-11-03: 4 mg via INTRAVENOUS
  Filled 2017-11-03: qty 2

## 2017-11-03 NOTE — ED Provider Notes (Signed)
[facility] DEPT Provider Note   CSN: [id number] Arrival date & time: 11/03/17  4580     History   Chief Complaint Chief Complaint  Patient presents with  . Fall    HPI Audrey Brown is a 71 y.o. female.  HPI  71yo female with history of Parkinson's disease presents with concern for fall.  Daughter reports she has intermittent confusion, and this morning woke up and said she was trying to get up to look at the snow.  Denies LOC, headache, neck pain. Had pain to the left shoulder, placed in sling by EMS.  Also reports left sided rib pain. At this time, she reports she feels "great" after getting fentanyl with EMS, denies any pain.   Past Medical History:  Diagnosis Date  . Arthritis    lower back  . Bipolar disorder ([place])   . Depression   . GERD (gastroesophageal reflux disease)   . Hiatal hernia   . Parkinson disease ([place])   . Poor short-term memory   . Sagittal band rupture at metacarpophalangeal joint 11/2016   right long finger    Patient Active Problem List   Diagnosis Date Noted  . Insomnia 06/09/2017  . Chronic mid back pain 08/14/2014  . 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 Abnormality of gait 04/10/2014  . Lumbosacral spondylosis without myelopathy 04/10/2014  . Dysphagia 06/16/2011  . GERD with stricture 06/16/2011  . Bipolar 1 disorder, mixed ([place]) 06/16/2011  . Parkinsonian syndrome ([place]) 06/16/2011  . Closed fracture of lumbar vertebra ([place]) 11/26/2010  . PARKINSONISM 11/13/2010  . TINEA CORPORIS 07/31/2010  . CELLULITIS, LEGS 07/31/2010  . RESTING TREMOR 07/31/2010  . DEPENDENT EDEMA 06/25/2009  . GERD 11/19/2008  . WEAKNESS 11/19/2008  . Depression 01/02/2008  . HEADACHE 01/02/2008  . CHICKENPOX, HX OF 01/02/2008    Past Surgical History:  Procedure Laterality Date  . ANKLE SURGERY    . BALLOON DILATION N/A 10/29/2017   Procedure: BALLOON DILATION;  Surgeon: Jerene Bears, MD;  Location: [facility];  Service: Gastroenterology;  Laterality: N/A;  . CERVICAL [place]  . CESAREAN SECTION    . CHOLECYSTECTOMY    . CYST REMOVAL NECK    . ESOPHAGOGASTRODUODENOSCOPY (EGD) WITH ESOPHAGEAL DILATION  07/27/2012   with Propofol  . ESOPHAGOGASTRODUODENOSCOPY (EGD) WITH PROPOFOL N/A 06/23/2016   Procedure: ESOPHAGOGASTRODUODENOSCOPY (EGD) WITH PROPOFOL;  Surgeon: Jerene Bears, MD;  Location: [facility];  Service: Gastroenterology;  Laterality: N/A;  . ESOPHAGOGASTRODUODENOSCOPY (EGD) WITH PROPOFOL N/A 10/29/2017   Procedure: ESOPHAGOGASTRODUODENOSCOPY (EGD) WITH PROPOFOL;  Surgeon: Jerene Bears, MD;  Location: [facility];  Service: Gastroenterology;  Laterality: N/A;  . FOOT NEUROMA SURGERY Left   . ORIF DISTAL RADIUS FRACTURE Right 07/04/2010  . SAVORY DILATION N/A 06/23/2016   Procedure: SAVORY DILATION;  Surgeon: Jerene Bears, MD;  Location: [facility];  Service: Gastroenterology;  Laterality: N/A;  . TENDON REPAIR Right 12/01/2016   Procedure: right long finger ulnar sagittal band reconstruction;  Surgeon: Leanora Cover, MD;  Location: [facility];  Service: Orthopedics;  Laterality: Right;  . TONSILLECTOMY      OB History    No data available       Home Medications    Prior to Admission medications   Medication Sig Start Date End Date Taking? Authorizing Provider  ALPRAZolam (XANAX) 0.25 MG tablet Take 1 tablet (0.25 mg total) by mouth 3 (three) times daily as needed for anxiety. 12/22/16  Yes  Laurey Morale, MD  AMBULATORY NON FORMULARY MEDICATION Medication Name: Domperidone 10 mg Take 1 tablet by mouth four times daily (before meals and at bedtime) Patient taking differently: Take 10 mg by mouth 3 (three) times daily. Medication Name: Domperidone 10 mg 08/09/17  Yes Pyrtle, Lajuan Lines, MD  buPROPion (WELLBUTRIN XL) 300 MG 24 hr tablet Take 300 mg by mouth daily.  03/27/16  Yes [provider]  busPIRone (BUSPAR) 10 MG tablet Take 10 mg by mouth  3 (three) times daily.   Yes [provider]  Calcium 500 MG CHEW Chew 500 mg by mouth at bedtime.    Yes [provider]  CARAFATE 1 GM/10ML suspension Take 1 g by mouth 3 (three) times daily before meals. 08/10/17  Yes [provider]  esomeprazole (NEXIUM) 40 MG capsule TAKE 1 CAPSULE TWICE A DAY BEFORE MEALS Patient taking differently: Take 40 mg by mouth 2 (two) times daily before a meal.  08/09/17  Yes Pyrtle, Lajuan Lines, MD  Eszopiclone 3 MG TABS Take 1 tablet (3 mg total) by mouth at bedtime. Take immediately before bedtime 09/29/17  Yes Laurey Morale, MD  ibuprofen (ADVIL,MOTRIN) 800 MG tablet TAKE 1 TABLET EVERY 8 HOURS AS NEEDED FOR PAIN Patient taking differently: TAKE 800 MG BY MOUTH EVERY 8 HOURS AS NEEDED FOR PAIN 06/25/17  Yes Laurey Morale, MD  LamoTRIgine XR (LAMICTAL XR) 200 MG TB24 Take 1 tablet (200 mg total) by mouth daily. 12/15/13  Yes Laurey Morale, MD  Magnesium 500 MG CAPS Take 500 mg by mouth at bedtime.    Yes [provider]  Melatonin 5 MG CAPS Take 5 mg by mouth at bedtime.   Yes [provider]  OSCIMIN 0.125 MG SUBL PLACE 1 TABLET UNDER THE TONGUE EVERY 6 HOURS AS NEEDED FOR CRAMPING Patient taking differently: PLACE 0.125 MG UNDER THE TONGUE EVERY 6 HOURS AS NEEDED FOR CRAMPING 02/09/17  Yes Pyrtle, Lajuan Lines, MD  oxyCODONE-acetaminophen (PERCOCET) 5-325 MG tablet Take 1 tablet by mouth every 6 (six) hours as needed for severe pain. 1-2 tabs po q6 hours prn pain Patient taking differently: Take 1 tablet by mouth every 6 (six) hours as needed for severe pain.  04/21/17  Yes Laurey Morale, MD  promethazine (PHENERGAN) 25 MG tablet Take 1 tablet (25 mg total) by mouth every 4 (four) hours as needed for nausea. 04/21/17  Yes Laurey Morale, MD  traMADol (ULTRAM) 50 MG tablet Take 2 tablets (100 mg total) by mouth every 8 (eight) hours as needed for moderate pain. 06/09/17  Yes Laurey Morale, MD    Family History Family History  Problem  Relation Age of Onset  . Emphysema Mother   . Heart attack Father   . Hypertension Unknown   . Lung cancer Unknown     Social History Social History   Tobacco Use  . Smoking status: Never Smoker  . Smokeless tobacco: Never Used  Substance Use Topics  . Alcohol use: No    Alcohol/week: 0.0 oz  . Drug use: No     Allergies   Penicillins; Celebrex [celecoxib]; Morphine and related; Risperdal [risperidone]; and Statins   Review of Systems Review of Systems  Constitutional: Negative for fever.  HENT: Negative for sore throat.   Eyes: Negative for visual disturbance.  Respiratory: Negative for cough and shortness of breath.   Cardiovascular: Positive for chest pain (left rib pain).  Gastrointestinal: Positive for nausea (reports from medication). Negative  for abdominal pain and vomiting.  Genitourinary: Negative for difficulty urinating.  Musculoskeletal: Positive for back pain. Negative for neck pain.  Skin: Negative for rash.  Neurological: Negative for syncope, weakness, numbness and headaches.     Physical Exam Updated Vital Signs BP (!) 161/98   Pulse 80   Temp 98.1 F (36.7 C) (Oral)   Resp 16   Ht 5\' 3"  (1.6 m)   Wt 77.1 kg (170 lb)   SpO2 97%   BMI 30.11 kg/m   Physical Exam  Constitutional: She is oriented to person, place, and time. She appears well-developed and well-nourished. No distress.  HENT:  Head: Normocephalic and atraumatic.  Eyes: Conjunctivae and EOM are normal.  Neck: Normal range of motion.  Cardiovascular: Normal rate, regular rhythm, normal heart sounds and intact distal pulses. Exam reveals no gallop and no friction rub.  No murmur heard. Pulmonary/Chest: Effort normal and breath sounds normal. No respiratory distress. She has no wheezes. She has no rales. She exhibits tenderness (left).  Abdominal: Soft. She exhibits no distension. There is no tenderness. There is no guarding.  Musculoskeletal: She exhibits no edema.       Left  shoulder: She exhibits tenderness.       Cervical back: She exhibits no bony tenderness.       Thoracic back: She exhibits bony tenderness.       Lumbar back: She exhibits no bony tenderness.       Right hand: She exhibits tenderness.  Neurological: She is alert and oriented to person, place, and time.  Skin: Skin is warm and dry. No rash noted. She is not diaphoretic. No erythema.  Nursing note and vitals reviewed.    ED Treatments / Results  Labs (all labs ordered are listed, but only abnormal results are displayed) Labs Reviewed - No data to display  EKG  EKG Interpretation None       Radiology Dg Chest 2 View  Result Date: 11/03/2017 CLINICAL DATA:  Chest pain after fall. EXAM: CHEST  2 VIEW COMPARISON:  Radiographs of September 10, 2017. FINDINGS: The heart size and mediastinal contours are within normal limits. Both lungs are clear. No pneumothorax or pleural effusion is noted. The visualized skeletal structures are unremarkable. IMPRESSION: No active cardiopulmonary disease. Electronically Signed   By: Marijo Conception, M.D.   On: 11/03/2017 09:03   Dg Thoracic Spine 2 View  Result Date: 11/03/2017 CLINICAL DATA:  Fall, left rib pain, upper back pain EXAM: THORACIC SPINE 2 VIEWS COMPARISON:  Chest x-ray 09/10/2017 FINDINGS: Normal alignment.  No fracture.  Early spurring. IMPRESSION: No acute findings. Electronically Signed   By: Rolm Baptise M.D.   On: 11/03/2017 09:04   Dg Shoulder Left  Result Date: 11/03/2017 CLINICAL DATA:  71 year old female status post fall this morning while attempting to use the bathroom. Pain. Left shoulder, left rib, upper back and right hand pain. EXAM: LEFT SHOULDER - 2+ VIEW COMPARISON:  Chest radiographs 09/10/2017. FINDINGS: No glenohumeral joint dislocation. Bone mineralization is within normal limits for age. Proximal left humerus intact. Left scapula and clavicle appear intact. Visible left ribs appear intact. Visible left chest appears  stable. IMPRESSION: No acute fracture or dislocation identified about the left shoulder. Electronically Signed   By: Genevie Ann M.D.   On: 11/03/2017 09:03   Dg Hand Complete Right  Result Date: 11/03/2017 CLINICAL DATA:  72 year old female status post fall this morning while attempting to use the bathroom. Right third MCP region pain.  EXAM: RIGHT HAND - COMPLETE 3+ VIEW COMPARISON:  Right wrist series 81611.  Right hand MRI 10/13/2016 FINDINGS: Patient unable to remove rings from the right second and fourth fingers for imaging. Pulse ox artifact also over the distal second finger. Sequelae of distal right radius ORIF. Visible hardware intact. Chronic right ulnar styloid fracture. Radiocarpal joint space loss, but otherwise carpal bone alignment appears within normal limits. Metacarpals appear intact. First MCP joint degeneration. The third MCP joint appears normal. Phalanges appear intact. IMPRESSION: No acute fracture or dislocation identified about the right hand. Electronically Signed   By: Genevie Ann M.D.   On: 11/03/2017 09:06    Procedures Procedures (including critical care time)  Medications Ordered in ED Medications  ondansetron (ZOFRAN) injection 4 mg (4 mg Intravenous Given 11/03/17 0801)     Initial Impression / Assessment and Plan / ED Course  I have reviewed the triage vital signs and the nursing notes.  Pertinent labs & imaging results that were available during my care of the patient were reviewed by me and considered in my medical decision making (see chart for details).    71yo female with history of Parkinson's disease presents with concern for fall, reports pain in mid back, left ribs, left shoulder and right hand. XR without fracture or dislocation.  Patient remains free of pain in the ED after receiving fentanyl with EMS. Suspect likely contusions. No other acute concern s today. Patient discharged in stable condition with understanding of reasons to return.   Final Clinical  Impressions(s) / ED Diagnoses   Final diagnoses:  Fall, initial encounter  Contusion of rib on left side, initial encounter  Acute pain of left shoulder    ED Discharge Orders    None       Gareth Morgan, MD 11/04/17 1243

## 2017-11-03 NOTE — ED Notes (Signed)
Bed: JQ96 Expected date:  Expected time:  Means of arrival:  Comments: 76 f fall

## 2017-11-03 NOTE — ED Triage Notes (Signed)
Pt BIB [facility]. Pt has hx of parkinsons and fell this am when she was attempting to use the restroom. She remembers the fall denies LOC. Denies neck /back pain. Deformity to the left shoulder, PMS in tact in left extremity, poss dislocation. Pain 8/10. Pt received 191mcg of fentanyl and 4mg  of zofran.

## 2017-11-05 ENCOUNTER — Emergency Department (HOSPITAL_COMMUNITY)
Admission: EM | Admit: 2017-11-05 | Discharge: 2017-11-05 | Disposition: A | Discharge status: Home / Self Care | Payer: Medicare Other | Attending: Emergency Medicine | Admitting: Emergency Medicine

## 2017-11-05 ENCOUNTER — Encounter (HOSPITAL_COMMUNITY): Payer: Self-pay | Admitting: Internal Medicine

## 2017-11-05 ENCOUNTER — Ambulatory Visit: Payer: Self-pay

## 2017-11-05 ENCOUNTER — Emergency Department (HOSPITAL_COMMUNITY): Payer: Medicare Other

## 2017-11-05 DIAGNOSIS — M545 Low back pain, unspecified: Secondary | ICD-10-CM

## 2017-11-05 DIAGNOSIS — G20 Parkinson's disease: Secondary | ICD-10-CM | POA: Diagnosis not present

## 2017-11-05 DIAGNOSIS — Z79899 Other long term (current) drug therapy: Secondary | ICD-10-CM | POA: Diagnosis not present

## 2017-11-05 DIAGNOSIS — M549 Dorsalgia, unspecified: Secondary | ICD-10-CM | POA: Diagnosis not present

## 2017-11-05 DIAGNOSIS — S79912A Unspecified injury of left hip, initial encounter: Secondary | ICD-10-CM | POA: Diagnosis not present

## 2017-11-05 DIAGNOSIS — R079 Chest pain, unspecified: Secondary | ICD-10-CM | POA: Diagnosis not present

## 2017-11-05 MED ORDER — OXYCODONE-ACETAMINOPHEN 5-325 MG PO TABS
1.0000 | ORAL_TABLET | Freq: Once | ORAL | Status: AC
  Administered 2017-11-05: 1 via ORAL
  Filled 2017-11-05: qty 1

## 2017-11-05 MED ORDER — HYDROCODONE-ACETAMINOPHEN 5-325 MG PO TABS: 1.0000 | ORAL_TABLET | Freq: Four times a day (QID) | ORAL | 0 refills | Status: DC | PRN

## 2017-11-05 NOTE — Telephone Encounter (Signed)
I spoke with pt and she agreed to go back to ER, waiting on daughter now.

## 2017-11-05 NOTE — ED Notes (Signed)
Pt returned from xray

## 2017-11-05 NOTE — Telephone Encounter (Signed)
Called pt. back to f/u on her calling EMS.  Stated that she has not called them yet, because she was waiting on her daughter to return from her appt.  Questioned pt. Is she is short of breath?   Stated "somewhat."  Encouraged to call EMS for further evaluation.  Agreed with plan.

## 2017-11-05 NOTE — ED Provider Notes (Signed)
[facility] DEPT Provider Note   CSN: [id number] Arrival date & time: 11/05/17  1707     History   Chief Complaint Chief Complaint  Patient presents with  . Back Pain    HPI Audrey Brown is a 71 y.o. female.  HPI   71 year old female presents today with complaints of left lower back pain.  Patient reports 2 days ago she suffered a fall.  She notes landing on her left side.  At the time she was having pain to the upper extremity but with no back pain.  Patient notes today she developed left lower back pain and left-sided hip pain.  She notes movement of the back causes significant pain.  She denies any distal neurological deficits, denies any other acute pain.  Patient is using Percocet at home with some minor improvement in her symptoms.   Past Medical History:  Diagnosis Date  . Arthritis    lower back  . Bipolar disorder ([place])   . Depression   . GERD (gastroesophageal reflux disease)   . Hiatal hernia   . Parkinson disease ([place])   . Poor short-term memory   . Sagittal band rupture at metacarpophalangeal joint 11/2016   right long finger    Patient Active Problem List   Diagnosis Date Noted  . Insomnia 06/09/2017  . Chronic mid back pain 08/14/2014  . 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 Abnormality of gait 04/10/2014  . Lumbosacral spondylosis without myelopathy 04/10/2014  . Dysphagia 06/16/2011  . GERD with stricture 06/16/2011  . Bipolar 1 disorder, mixed ([place]) 06/16/2011  . Parkinsonian syndrome ([place]) 06/16/2011  . Closed fracture of lumbar vertebra ([place]) 11/26/2010  . PARKINSONISM 11/13/2010  . TINEA CORPORIS 07/31/2010  . CELLULITIS, LEGS 07/31/2010  . RESTING TREMOR 07/31/2010  . DEPENDENT EDEMA 06/25/2009  . GERD 11/19/2008  . WEAKNESS 11/19/2008  . Depression 01/02/2008  . HEADACHE 01/02/2008  . CHICKENPOX, HX OF 01/02/2008    Past Surgical History:  Procedure Laterality Date  . ANKLE SURGERY     . BALLOON DILATION N/A 10/29/2017   Procedure: BALLOON DILATION;  Surgeon: Jerene Bears, MD;  Location: [facility];  Service: Gastroenterology;  Laterality: N/A;  . CERVICAL [place]  . CESAREAN SECTION    . CHOLECYSTECTOMY    . CYST REMOVAL NECK    . ESOPHAGOGASTRODUODENOSCOPY (EGD) WITH ESOPHAGEAL DILATION  07/27/2012   with Propofol  . ESOPHAGOGASTRODUODENOSCOPY (EGD) WITH PROPOFOL N/A 06/23/2016   Procedure: ESOPHAGOGASTRODUODENOSCOPY (EGD) WITH PROPOFOL;  Surgeon: Jerene Bears, MD;  Location: [facility];  Service: Gastroenterology;  Laterality: N/A;  . ESOPHAGOGASTRODUODENOSCOPY (EGD) WITH PROPOFOL N/A 10/29/2017   Procedure: ESOPHAGOGASTRODUODENOSCOPY (EGD) WITH PROPOFOL;  Surgeon: Jerene Bears, MD;  Location: [facility];  Service: Gastroenterology;  Laterality: N/A;  . FOOT NEUROMA SURGERY Left   . ORIF DISTAL RADIUS FRACTURE Right 07/04/2010  . SAVORY DILATION N/A 06/23/2016   Procedure: SAVORY DILATION;  Surgeon: Jerene Bears, MD;  Location: [facility];  Service: Gastroenterology;  Laterality: N/A;  . TENDON REPAIR Right 12/01/2016   Procedure: right long finger ulnar sagittal band reconstruction;  Surgeon: Leanora Cover, MD;  Location: [facility];  Service: Orthopedics;  Laterality: Right;  . TONSILLECTOMY      OB History    No data available       Home Medications    Prior to Admission medications   Medication Sig Start Date End Date Taking? Authorizing Provider  ALPRAZolam Duanne Moron) 0.25  MG tablet Take 1 tablet (0.25 mg total) by mouth 3 (three) times daily as needed for anxiety. 12/22/16  Yes Laurey Morale, MD  AMBULATORY NON FORMULARY MEDICATION Medication Name: Domperidone 10 mg Take 1 tablet by mouth four times daily (before meals and at bedtime) Patient taking differently: Take 10 mg by mouth 3 (three) times daily. Medication Name: Domperidone 10 mg 08/09/17  Yes Pyrtle, Lajuan Lines, MD  buPROPion (WELLBUTRIN XL) 300 MG 24 hr tablet Take  300 mg by mouth daily.  03/27/16  Yes [provider]  busPIRone (BUSPAR) 10 MG tablet Take 10 mg by mouth 3 (three) times daily.   Yes [provider]  Calcium 500 MG CHEW Chew 500 mg by mouth at bedtime.    Yes [provider]  CARAFATE 1 GM/10ML suspension Take 1 g by mouth 3 (three) times daily before meals. 08/10/17  Yes [provider]  esomeprazole (NEXIUM) 40 MG capsule TAKE 1 CAPSULE TWICE A DAY BEFORE MEALS Patient taking differently: Take 40 mg by mouth 2 (two) times daily before a meal.  08/09/17  Yes Pyrtle, Lajuan Lines, MD  Eszopiclone 3 MG TABS Take 1 tablet (3 mg total) by mouth at bedtime. Take immediately before bedtime 09/29/17  Yes Laurey Morale, MD  ibuprofen (ADVIL,MOTRIN) 800 MG tablet TAKE 1 TABLET EVERY 8 HOURS AS NEEDED FOR PAIN Patient taking differently: TAKE 800 MG BY MOUTH EVERY 8 HOURS AS NEEDED FOR PAIN 06/25/17  Yes Laurey Morale, MD  LamoTRIgine XR (LAMICTAL XR) 200 MG TB24 Take 1 tablet (200 mg total) by mouth daily. 12/15/13  Yes Laurey Morale, MD  Magnesium 500 MG CAPS Take 500 mg by mouth at bedtime.    Yes [provider]  Melatonin 5 MG CAPS Take 5 mg by mouth at bedtime.   Yes [provider]  OSCIMIN 0.125 MG SUBL PLACE 1 TABLET UNDER THE TONGUE EVERY 6 HOURS AS NEEDED FOR CRAMPING Patient taking differently: PLACE 0.125 MG UNDER THE TONGUE EVERY 6 HOURS AS NEEDED FOR CRAMPING 02/09/17  Yes Pyrtle, Lajuan Lines, MD  oxyCODONE-acetaminophen (PERCOCET) 5-325 MG tablet Take 1 tablet by mouth every 6 (six) hours as needed for severe pain. 1-2 tabs po q6 hours prn pain Patient taking differently: Take 1 tablet by mouth every 6 (six) hours as needed for severe pain.  04/21/17  Yes Laurey Morale, MD  promethazine (PHENERGAN) 25 MG tablet Take 1 tablet (25 mg total) by mouth every 4 (four) hours as needed for nausea. 04/21/17  Yes Laurey Morale, MD  traMADol (ULTRAM) 50 MG tablet Take 2 tablets (100 mg total) by mouth every 8  (eight) hours as needed for moderate pain. 06/09/17  Yes Laurey Morale, MD  HYDROcodone-acetaminophen (NORCO/VICODIN) 5-325 MG tablet Take 1 tablet by mouth every 6 (six) hours as needed. 11/05/17   Okey Regal, PA-C    Family History Family History  Problem Relation Age of Onset  . Emphysema Mother   . Heart attack Father   . Hypertension Unknown   . Lung cancer Unknown     Social History Social History   Tobacco Use  . Smoking status: Never Smoker  . Smokeless tobacco: Never Used  Substance Use Topics  . Alcohol use: No    Alcohol/week: 0.0 oz  . Drug use: No     Allergies   Penicillins; Celebrex [celecoxib]; Morphine and related; Risperdal [risperidone]; and Statins   Review of Systems Review of Systems  All  other systems reviewed and are negative.    Physical Exam Updated Vital Signs BP (!) 152/85 (BP Location: Right Arm)   Pulse 99   Temp 99.8 F (37.7 C) (Oral)   Resp 14   Ht 5\' 3"  (1.6 m)   Wt 79.4 kg (175 lb)   SpO2 95%   BMI 31.00 kg/m   Physical Exam  Constitutional: She is oriented to person, place, and time. She appears well-developed and well-nourished.  HENT:  Head: Normocephalic and atraumatic.  Eyes: Conjunctivae are normal. Pupils are equal, round, and reactive to light. Right eye exhibits no discharge. Left eye exhibits no discharge. No scleral icterus.  Neck: Normal range of motion. No JVD present. No tracheal deviation present.  Pulmonary/Chest: Effort normal. No stridor.  Musculoskeletal:  No C-spine tenderness, minimal lower T-spine tenderness, diffuse tenderness to the lumbar region worse on the left lateral musculature-straight leg negative left significant back pain with elevation-distal sensation strength and motor function is intact.  Hips are stable bilateral with AP and lateral compression  Neurological: She is alert and oriented to person, place, and time. Coordination normal.  Psychiatric: She has a normal mood and affect.  Her behavior is normal. Judgment and thought content normal.  Nursing note and vitals reviewed.    ED Treatments / Results  Labs (all labs ordered are listed, but only abnormal results are displayed) Labs Reviewed - No data to display  EKG  EKG Interpretation None       Radiology Dg Lumbar Spine Complete  Result Date: 11/05/2017 CLINICAL DATA:  Back pain after a fall 2 days ago. EXAM: LUMBAR SPINE - COMPLETE 4+ VIEW COMPARISON:  04/06/2016 FINDINGS: Stable L1 compression deformity. No evidence for an acute fracture. Intervertebral disc spaces are preserved in the lumbar spine. SI joints unremarkable. IMPRESSION: Stable. Chronic L1 compression deformity. No acute bony abnormality. Electronically Signed   By: Misty Stanley M.D.   On: 11/05/2017 19:10   Dg Hip Unilat W Or Wo Pelvis 2-3 Views Left  Result Date: 11/05/2017 CLINICAL DATA:  Back pain after fall EXAM: DG HIP (WITH OR WITHOUT PELVIS) 2-3V LEFT COMPARISON:  None. FINDINGS: SI joints are symmetric. Pubic symphysis and rami are intact. No fracture or malalignment. IMPRESSION: No acute osseous abnormality. Electronically Signed   By: Donavan Foil M.D.   On: 11/05/2017 19:10    Procedures Procedures (including critical care time)  Medications Ordered in ED Medications  oxyCODONE-acetaminophen (PERCOCET/ROXICET) 5-325 MG per tablet 1 tablet (1 tablet Oral Given 11/05/17 2134)     Initial Impression / Assessment and Plan / ED Course  I have reviewed the triage vital signs and the nursing notes.  Pertinent labs & imaging results that were available during my care of the patient were reviewed by me and considered in my medical decision making (see chart for details).      Final Clinical Impressions(s) / ED Diagnoses   Final diagnoses:  Acute left-sided low back pain without sciatica    Labs:   Imaging: DG lumbar, DG hip unilateral left  Consults:  Therapeutics: Percocet  Discharge Meds:   Assessment/Plan:  71 year old female presents status post fall.  She originally did not have any pain in the lower back or hip which she is reporting today.  I have low suspicion for acute fracture in this patient, most likely muscular in nature.  Patient will have plain films to rule out acute fracture.  Patient without any acute findings.  I have low suspicion for acute  fracture.  Patient further notes that she recently ran out of her narcotic medication.  Due to reported fall recently with pain she will be given a short prescription until she can make contact with her primary care.  Patient given strict return precautions, both her and her family verbalized understanding and agreement to today's plan had no further questions or concerns at the time of discharge.      ED Discharge Orders        Ordered    HYDROcodone-acetaminophen (NORCO/VICODIN) 5-325 MG tablet  Every 6 hours PRN     11/05/17 2024       Okey Regal, PA-C 11/05/17 2311    Malvin Johns, MD 11/05/17 [phone]

## 2017-11-05 NOTE — Discharge Instructions (Signed)
Please read attached information. If you experience any new or worsening signs or symptoms please return to the emergency room for evaluation. Please follow-up with your primary care provider or specialist as discussed. Please use medication prescribed only as directed and discontinue taking if you have any concerning signs or symptoms.   °

## 2017-11-05 NOTE — Telephone Encounter (Signed)
The pt. called to report she fell on Wed. 12/26 and went to the ER.  Stated upon arriving home on 12/27, she hurt all over, and started having pain in "lumbar region" of back.  Rated the pain at "12/10".  Stated "it hurts to breathe in my upper back area." Reported the pain is "constant" in the back. Reported has taken Ibuprofen and Tramadol, and neither helped the pain very much.  Advised per Protocol, to go to the ER.  The pt. stated she will call EMS, since daughter is leaving at this time to go to another appt.  Stated her 32 yr. old  granddaughter is with her.  Advised pt. Will call back in 15 min. To confirm that EMS has arrived. Agreed with plan.          Reason for Disposition . [1] SEVERE back pain (e.g., excruciating) AND [2] sudden onset AND [3] age > 51  Answer Assessment - Initial Assessment Questions 1. ONSET: "When did the pain begin?"      Fell on Wednesday 2. LOCATION: "Where does it hurt?" (upper, mid or lower back)    Lower back in the lumbar region 3. SEVERITY: "How bad is the pain?"  (e.g., Scale 1-10; mild, moderate, or severe)   - MILD (1-3): doesn't interfere with normal activities    - MODERATE (4-7): interferes with normal activities or awakens from sleep    - SEVERE (8-10): excruciating pain, unable to do any normal activities      Severe "12" and has worsened since seen in the ER on 12/26  4. PATTERN: "Is the pain constant?" (e.g., yes, no; constant, intermittent)     constant 5. RADIATION: "Does the pain shoot into your legs or elsewhere?"    Denies radiation 6. CAUSE:  "What do you think is causing the back pain?"     Recent fall 7. BACK OVERUSE:  "Any recent lifting of heavy objects, strenuous work or exercise?"     No/ fall 8. MEDICATIONS: "What have you taken so far for the pain?" (e.g., nothing, acetaminophen, NSAIDS)     Ibuprofen and Tramadol 9. NEUROLOGIC SYMPTOMS: "Do you have any weakness, numbness, or problems with bowel/bladder control?"     Legs  feel a little weak; some numbness in legs; requires assistance to move; hurts all over. 10. OTHER SYMPTOMS: "Do you have any other symptoms?" (e.g., fever, abdominal pain, burning with urination, blood in urine)      C/o pain in upper back-chest area with taking deep breath. Denies abdominal pain 11. PREGNANCY: "Is there any chance you are pregnant?" (e.g., yes, no; LMP)       no  Protocols used: BACK PAIN-A-AH

## 2017-11-05 NOTE — ED Triage Notes (Addendum)
Pt brought in by [facility] from home complaining of back pain after falling 2 days ago. Was here 2 days ago for same. Pain 10/10 in lower back. Per EMS bruising on left thigh is from fall 2 days ago. Patient did not hit her head when she fell and is not on blood thinners. Denies chest pain and shortness of breath.

## 2017-11-05 NOTE — ED Notes (Signed)
Pt transported to xray 

## 2017-11-05 NOTE — Telephone Encounter (Signed)
Monitor patient for ER arrival

## 2017-11-08 NOTE — Telephone Encounter (Signed)
Patient seen in ED as advised.

## 2017-11-16 ENCOUNTER — Ambulatory Visit: Payer: Medicare Other | Admitting: Family Medicine

## 2017-11-22 ENCOUNTER — Encounter: Payer: Self-pay | Admitting: Family Medicine

## 2017-11-22 ENCOUNTER — Ambulatory Visit (INDEPENDENT_AMBULATORY_CARE_PROVIDER_SITE_OTHER): Payer: Medicare Other | Admitting: Family Medicine

## 2017-11-22 VITALS — BP 120/68 | HR 87 | Temp 98.0°F

## 2017-11-22 DIAGNOSIS — M5440 Lumbago with sciatica, unspecified side: Secondary | ICD-10-CM | POA: Diagnosis not present

## 2017-11-22 DIAGNOSIS — M4850XA Collapsed vertebra, not elsewhere classified, site unspecified, initial encounter for fracture: Secondary | ICD-10-CM

## 2017-11-22 MED ORDER — OXYCODONE-ACETAMINOPHEN 5-325 MG PO TABS: 1.0000 | ORAL_TABLET | Freq: Four times a day (QID) | ORAL | 0 refills | Status: DC | PRN

## 2017-11-22 MED ORDER — CYCLOBENZAPRINE HCL 10 MG PO TABS: 10.0000 mg | ORAL_TABLET | Freq: Three times a day (TID) | ORAL | 2 refills | Status: DC | PRN

## 2017-11-22 NOTE — Progress Notes (Signed)
   Subjective:    Patient ID: Audrey Brown, female    DOB: 09-25-1946, 72 y.o.   MRN: [id number]  HPI Here to follow up on ER visits on 11-03-17 and again on 11-05-17 for pain after a fall at home. She normally uses a walker to get around her house but on the morning of 11-03-17 she remembers waking up and realizing that it was snowing. She jumped up out of bed and tried to walk to her window without the walker. It sounds like she became orthostatic and she fell, landing on the floor on her left side. At the ER she had pain in the left ribs and left shoulder, and Xrays ruled out any fractures. She was sent home with some pain meds, but in the next 24 hours she developed a severe low back pain. At the second visit a plain Xray of the lumbar spine was negative. Since then she has continued to have left sided lower back. Norco does onto help. No radiation of pain to the legs.    Review of Systems  Respiratory: Negative.   Cardiovascular: Negative.   Musculoskeletal: Positive for back pain.  Neurological: Positive for weakness and numbness.       Objective:   Physical Exam  Constitutional: She is oriented to person, place, and time. She appears well-developed and well-nourished.  In a wheelchair   Cardiovascular: Normal rate, regular rhythm, normal heart sounds and intact distal pulses.  Pulmonary/Chest: Effort normal and breath sounds normal. No respiratory distress. She has no wheezes. She has no rales.  Musculoskeletal:  Very tender over the lower back with reduced ROM  Neurological: She is alert and oriented to person, place, and time.          Assessment & Plan:  Low back pain. Try Percocet for pain. Add Flexeril. Set up an MRI of the lower spine to look for herniated discs or compression fracture.  Alysia Penna, MD

## 2017-12-01 ENCOUNTER — Ambulatory Visit
Admission: RE | Admit: 2017-12-01 | Discharge: 2017-12-01 | Disposition: A | Discharge status: Home / Self Care | Payer: Medicare Other | Source: Ambulatory Visit | Attending: Family Medicine | Admitting: Family Medicine

## 2017-12-01 DIAGNOSIS — M48061 Spinal stenosis, lumbar region without neurogenic claudication: Secondary | ICD-10-CM | POA: Diagnosis not present

## 2017-12-01 DIAGNOSIS — M5440 Lumbago with sciatica, unspecified side: Secondary | ICD-10-CM

## 2017-12-15 ENCOUNTER — Ambulatory Visit: Payer: Medicare Other | Admitting: Family Medicine

## 2017-12-15 DIAGNOSIS — Z0289 Encounter for other administrative examinations: Secondary | ICD-10-CM

## 2017-12-15 NOTE — Addendum Note (Signed)
Addended by: Alysia Penna A on: 12/15/2017 09:14 AM   Modules accepted: Orders

## 2017-12-17 ENCOUNTER — Other Ambulatory Visit: Payer: Self-pay | Admitting: Family Medicine

## 2017-12-17 ENCOUNTER — Telehealth: Payer: Self-pay | Admitting: Family Medicine

## 2017-12-17 DIAGNOSIS — M4850XA Collapsed vertebra, not elsewhere classified, site unspecified, initial encounter for fracture: Secondary | ICD-10-CM

## 2017-12-17 NOTE — Telephone Encounter (Signed)
Pt is returning call, she will be by the phone. Please call back, thanks

## 2017-12-17 NOTE — Telephone Encounter (Signed)
Copied from [place]. Topic: General - Other >> Dec 17, 2017  8:48 AM Boyd Kerbs wrote: Reason for CRM: patient is asking for nurse for Dr. Sarajane Jews to please call her.  Medical question but did not want to go into it.

## 2017-12-17 NOTE — Telephone Encounter (Signed)
Routed to wrong department, please route accordingly  °

## 2017-12-20 NOTE — Telephone Encounter (Signed)
Pt stated that the consultant that she is seeing about about her back injury needs lab work. Pt needs a CBC and they need Dr. Sarajane Jews to order this.  Pt does have an appt with Dr. Sarajane Jews on the 18th but she would like this done before then.   With what we have going on with the CBC machine should she have this done here or some where else?

## 2017-12-20 NOTE — Telephone Encounter (Signed)
I put in the order. She can have it drawn here

## 2017-12-20 NOTE — Telephone Encounter (Signed)
Pt advised and voiced understanding. Pt schedule for lab appt

## 2017-12-20 NOTE — Addendum Note (Signed)
Addended by: Alysia Penna A on: 12/20/2017 01:07 PM   Modules accepted: Orders

## 2017-12-21 ENCOUNTER — Other Ambulatory Visit (INDEPENDENT_AMBULATORY_CARE_PROVIDER_SITE_OTHER): Payer: Medicare Other

## 2017-12-21 DIAGNOSIS — M4850XA Collapsed vertebra, not elsewhere classified, site unspecified, initial encounter for fracture: Secondary | ICD-10-CM | POA: Diagnosis not present

## 2017-12-21 LAB — CBC WITH DIFFERENTIAL/PLATELET
BASOS PCT: 0.8 % (ref 0.0–3.0)
Basophils Absolute: 0.1 10*3/uL (ref 0.0–0.1)
EOS PCT: 3.3 % (ref 0.0–5.0)
Eosinophils Absolute: 0.3 10*3/uL (ref 0.0–0.7)
HEMATOCRIT: 39.3 % (ref 36.0–46.0)
HEMOGLOBIN: 13.1 g/dL (ref 12.0–15.0)
LYMPHS PCT: 30.7 % (ref 12.0–46.0)
Lymphs Abs: 2.5 10*3/uL (ref 0.7–4.0)
MCHC: 33.3 g/dL (ref 30.0–36.0)
MCV: 89 fl (ref 78.0–100.0)
MONOS PCT: 7.6 % (ref 3.0–12.0)
Monocytes Absolute: 0.6 10*3/uL (ref 0.1–1.0)
Neutro Abs: 4.6 10*3/uL (ref 1.4–7.7)
Neutrophils Relative %: 57.6 % (ref 43.0–77.0)
Platelets: 308 10*3/uL (ref 150.0–400.0)
RBC: 4.42 Mil/uL (ref 3.87–5.11)
RDW: 13.8 % (ref 11.5–15.5)
WBC: 8 10*3/uL (ref 4.0–10.5)

## 2017-12-22 ENCOUNTER — Telehealth: Payer: Self-pay

## 2017-12-22 NOTE — Telephone Encounter (Signed)
[facility]

## 2017-12-22 NOTE — Telephone Encounter (Signed)
Last OV 11/22/2017   Last refilled 12/22/2016 disp 90 with 2 refills  Sent to PCP for approval   Sent Rx to [facility]

## 2017-12-23 ENCOUNTER — Other Ambulatory Visit: Payer: Self-pay | Admitting: Family Medicine

## 2017-12-23 MED ORDER — ALPRAZOLAM 0.25 MG PO TABS: 0.2500 mg | ORAL_TABLET | Freq: Three times a day (TID) | ORAL | 1 refills | Status: DC | PRN

## 2017-12-23 NOTE — Telephone Encounter (Signed)
I sent this in to [facility]

## 2017-12-27 ENCOUNTER — Ambulatory Visit
Admission: RE | Admit: 2017-12-27 | Discharge: 2017-12-27 | Disposition: A | Discharge status: Home / Self Care | Payer: Medicare Other | Source: Ambulatory Visit | Attending: Family Medicine | Admitting: Family Medicine

## 2017-12-27 DIAGNOSIS — S22080A Wedge compression fracture of T11-T12 vertebra, initial encounter for closed fracture: Secondary | ICD-10-CM | POA: Diagnosis not present

## 2017-12-27 DIAGNOSIS — M4850XA Collapsed vertebra, not elsewhere classified, site unspecified, initial encounter for fracture: Secondary | ICD-10-CM

## 2017-12-27 NOTE — Progress Notes (Signed)
Chief Complaint: Patient was seen in consultation today for a T11 compression fracture at the request of Fry,Stephen A  Referring Physician(s): Fry,Stephen A   History of Present Illness: Audrey Brown is a 72 y.o. female with Parkinson disease who sustained a fall 11/03/17.  She has had increased back pain with tingling and pain into both legs since.  MRI 12/01/17 demonstrated a subacute non healed compression fracture at T11.  The L1 compression fracture is remote and healed.  She rates her pain as 7/10 on a visual analog scale and scored 16/24 on the Roland-[place] Disability Questionnaire.  She was confined to a wheel chair due to her parkinson disease prior to the fall.  Due to her fracture related pain, she is no longer able to cook, bathe, or dress independently.  She has also needed to hire a cleaning service to keep up her house.  She was previously able to perform these ADLs independently.  The pain is not improving.  Her ability to perform ADLs is not improving.  She is managing her pain with OTC NSAIDS and tramadol.  She occasional uses percocet in addition.  Past Medical History:  Diagnosis Date  . Arthritis    lower back  . Bipolar disorder ([place])   . Depression   . GERD (gastroesophageal reflux disease)   . Hiatal hernia   . Parkinson disease ([place])   . Poor short-term memory   . Sagittal band rupture at metacarpophalangeal joint 11/2016   right long finger    Past Surgical History:  Procedure Laterality Date  . ANKLE SURGERY    . BALLOON DILATION N/A 10/29/2017   Procedure: BALLOON DILATION;  Surgeon: Jerene Bears, MD;  Location: [facility];  Service: Gastroenterology;  Laterality: N/A;  . CERVICAL [place]  . CESAREAN SECTION    . CHOLECYSTECTOMY    . CYST REMOVAL NECK    . ESOPHAGOGASTRODUODENOSCOPY (EGD) WITH ESOPHAGEAL DILATION  07/27/2012   with Propofol  . ESOPHAGOGASTRODUODENOSCOPY (EGD) WITH PROPOFOL N/A 06/23/2016   Procedure:  ESOPHAGOGASTRODUODENOSCOPY (EGD) WITH PROPOFOL;  Surgeon: Jerene Bears, MD;  Location: [facility];  Service: Gastroenterology;  Laterality: N/A;  . ESOPHAGOGASTRODUODENOSCOPY (EGD) WITH PROPOFOL N/A 10/29/2017   Procedure: ESOPHAGOGASTRODUODENOSCOPY (EGD) WITH PROPOFOL;  Surgeon: Jerene Bears, MD;  Location: [facility];  Service: Gastroenterology;  Laterality: N/A;  . FOOT NEUROMA SURGERY Left   . ORIF DISTAL RADIUS FRACTURE Right 07/04/2010  . SAVORY DILATION N/A 06/23/2016   Procedure: SAVORY DILATION;  Surgeon: Jerene Bears, MD;  Location: [facility];  Service: Gastroenterology;  Laterality: N/A;  . TENDON REPAIR Right 12/01/2016   Procedure: right long finger ulnar sagittal band reconstruction;  Surgeon: Leanora Cover, MD;  Location: [facility];  Service: Orthopedics;  Laterality: Right;  . TONSILLECTOMY      Allergies: Penicillins; Celebrex [celecoxib]; Morphine and related; Risperdal [risperidone]; and Statins  Medications: Prior to Admission medications   Medication Sig Start Date End Date Taking? Authorizing Provider  ALPRAZolam (XANAX) 0.25 MG tablet Take 1 tablet (0.25 mg total) by mouth 3 (three) times daily as needed for anxiety. 12/23/17   Laurey Morale, MD  AMBULATORY NON FORMULARY MEDICATION Medication Name: Domperidone 10 mg Take 1 tablet by mouth four times daily (before meals and at bedtime) Patient taking differently: Take 10 mg by mouth 3 (three) times daily. Medication Name: Domperidone 10 mg 08/09/17   Jerene Bears, MD  buPROPion (WELLBUTRIN XL) 300 MG 24 hr tablet Take  300 mg by mouth daily.  03/27/16   [provider]  busPIRone (BUSPAR) 10 MG tablet Take 10 mg by mouth 3 (three) times daily.    [provider]  Calcium 500 MG CHEW Chew 500 mg by mouth at bedtime.     [provider]  CARAFATE 1 GM/10ML suspension Take 1 g by mouth 3 (three) times daily before meals. 08/10/17   [provider]  cyclobenzaprine  (FLEXERIL) 10 MG tablet Take 1 tablet (10 mg total) by mouth 3 (three) times daily as needed for muscle spasms. 11/22/17   Laurey Morale, MD  esomeprazole (NEXIUM) 40 MG capsule TAKE 1 CAPSULE TWICE A DAY BEFORE MEALS Patient taking differently: Take 40 mg by mouth 2 (two) times daily before a meal.  08/09/17   Pyrtle, Lajuan Lines, MD  Eszopiclone 3 MG TABS Take 1 tablet (3 mg total) by mouth at bedtime. Take immediately before bedtime 09/29/17   Laurey Morale, MD  ibuprofen (ADVIL,MOTRIN) 800 MG tablet TAKE 1 TABLET EVERY 8 HOURS AS NEEDED FOR PAIN Patient taking differently: TAKE 800 MG BY MOUTH EVERY 8 HOURS AS NEEDED FOR PAIN 06/25/17   Laurey Morale, MD  LamoTRIgine XR (LAMICTAL XR) 200 MG TB24 Take 1 tablet (200 mg total) by mouth daily. 12/15/13   Laurey Morale, MD  Magnesium 500 MG CAPS Take 500 mg by mouth at bedtime.     [provider]  Melatonin 5 MG CAPS Take 5 mg by mouth at bedtime.    [provider]  OSCIMIN 0.125 MG SUBL PLACE 1 TABLET UNDER THE TONGUE EVERY 6 HOURS AS NEEDED FOR CRAMPING Patient taking differently: PLACE 0.125 MG UNDER THE TONGUE EVERY 6 HOURS AS NEEDED FOR CRAMPING 02/09/17   Pyrtle, Lajuan Lines, MD  oxyCODONE-acetaminophen (PERCOCET) 5-325 MG tablet Take 1 tablet by mouth every 6 (six) hours as needed for severe pain. 11/22/17   Laurey Morale, MD  promethazine (PHENERGAN) 25 MG tablet Take 1 tablet (25 mg total) by mouth every 4 (four) hours as needed for nausea. 04/21/17   Laurey Morale, MD     Family History  Problem Relation Age of Onset  . Emphysema Mother   . Heart attack Father   . Hypertension Unknown   . Lung cancer Unknown     Social History   Socioeconomic History  . Marital status: Widowed    Spouse name: Not on file  . Number of children: 4  . Years of education: Not on file  . Highest education level: Not on file  Social Needs  . Financial resource strain: Not on file  . Food insecurity - worry: Not on file  . Food insecurity -  inability: Not on file  . Transportation needs - medical: Not on file  . Transportation needs - non-medical: Not on file  Occupational History  . Occupation: Retired    Fish farm manager: RETIRED  Tobacco Use  . Smoking status: Never Smoker  . Smokeless tobacco: Never Used  Substance and Sexual Activity  . Alcohol use: No    Alcohol/week: 0.0 oz  . Drug use: No  . Sexual activity: Not on file  Other Topics Concern  . Not on file  Social History Narrative   Daughter is her POA and must come to all visits in GI and [facility]       Review of Systems: A 12 point ROS discussed and pertinent positives are indicated in the HPI above.  All other  systems are negative.  Review of Systems  Gastrointestinal:       Reflux  Neurological: Positive for tremors, weakness and numbness.       Memory problems and disorientation.  Psychiatric/Behavioral: The patient is nervous/anxious.     Vital Signs: BP (!) 170/67   Pulse 83   Temp 98.2 F (36.8 C)   Resp 16   SpO2 97%   Physical Exam  Constitutional: She is oriented to person, place, and time. She appears well-developed and well-nourished.  Neurological: She is alert and oriented to person, place, and time. She has normal strength.  Nursing note and vitals reviewed.   Imaging: Mr Lumbar Spine Wo Contrast  Result Date: 12/01/2017 CLINICAL DATA:  Lower back pain. Bilateral leg numbness and weakness. Fall 11/03/2017 EXAM: MRI LUMBAR SPINE WITHOUT CONTRAST TECHNIQUE: Multiplanar, multisequence MR imaging of the lumbar spine was performed. No intravenous contrast was administered. COMPARISON:  Radiography 11/05/2017 FINDINGS: Segmentation:  Standard. Alignment:  2 mm of L1-2 retrolisthesis Vertebrae: There is a band of marrow edema across the T11 upper body with a horizontal fracture cleft and anterior superior corner buckling. No paravertebral edema. No retropulsion. Height loss is less than 25%. Remote L1 superior endplate fracture with mild height  loss. No evidence of bone lesion. Conus medullaris and cauda equina: Conus extends to the T12-L1 level. Conus and cauda equina appear normal. Paraspinal and other soft tissues: Negative Disc levels: T12- L1: Mild disc narrowing.  No impingement L1-L2: Mild disc narrowing and desiccation. Bilateral foraminal crowding from disc height loss. Patent spinal canal L2-L3: Unremarkable. L3-L4: Unremarkable. L4-L5: Unremarkable. L5-S1:Mild facet spurring.  No herniation or impingement. IMPRESSION: 1. Acute or subacute T11 compression fracture with mild height loss. No retropulsion. 2. Remote L1 compression fracture. 3. Overall mild degenerative changes. No spinal stenosis to explain bilateral leg symptoms. Electronically Signed   By: Monte Fantasia M.D.   On: 12/01/2017 13:03    Labs:  CBC: Recent Labs    04/20/17 0518 12/21/17 1110  WBC 8.4 8.0  HGB 12.4 13.1  HCT 37.4 39.3  PLT 291 308.0    COAGS: No results for input(s): INR, APTT in the last 8760 hours.  BMP: Recent Labs    04/20/17 0518  NA 144  K 3.7  CL 109  CO2 27  GLUCOSE 99  BUN 19  CALCIUM 9.1  CREATININE 0.74  GFRNONAA >60  GFRAA >60    Assessment and Plan:  1.  Symptomatic T11 subacute compression fracture with incomplete healing.  This is likely an osteoporotic fracture.  Given the delayed healing and significant impact on ADLs, Ms. Boateng is an excellent candidate for vertebral augmentation.  I discussed the risks and benefits of the procedure including expected outcomes with the patient and her daughter who joined [place] by phone.  I answered all questions.  The patient and her daughter wish to proceed.  2.  Following the procedure, the patient should be evaluated for osteoporosis.  She is currently on Calcium, but may benefit from additional therapy.  Thank you for this interesting consult.  I greatly enjoyed meeting MARINELL IGARASHI and look forward to participating in their care.  A copy of this report was sent to the  requesting provider on this date.  Electronically Signed: Arna Snipe, MD 12/27/2017, 3:46 PM   I spent a total of  15 Minutes   in face to face in clinical consultation, greater than 50% of which was counseling/coordinating care for Osker Mason, female  DOB: 05-24-1946, 72 y.o.   MRN: [id number]

## 2017-12-30 ENCOUNTER — Ambulatory Visit
Admission: RE | Admit: 2017-12-30 | Discharge: 2017-12-30 | Disposition: A | Discharge status: Home / Self Care | Payer: Medicare Other | Source: Ambulatory Visit | Attending: Family Medicine | Admitting: Family Medicine

## 2017-12-30 ENCOUNTER — Other Ambulatory Visit (HOSPITAL_COMMUNITY): Payer: Self-pay | Admitting: Radiology

## 2017-12-30 DIAGNOSIS — Z09 Encounter for follow-up examination after completed treatment for conditions other than malignant neoplasm: Secondary | ICD-10-CM

## 2017-12-30 DIAGNOSIS — M4850XA Collapsed vertebra, not elsewhere classified, site unspecified, initial encounter for fracture: Secondary | ICD-10-CM

## 2017-12-30 DIAGNOSIS — M4854XA Collapsed vertebra, not elsewhere classified, thoracic region, initial encounter for fracture: Secondary | ICD-10-CM | POA: Diagnosis not present

## 2017-12-30 MED ORDER — FENTANYL CITRATE (PF) 100 MCG/2ML IJ SOLN
25.0000 ug | INTRAMUSCULAR | Status: DC | PRN
  Administered 2017-12-30 (×3): 25 ug via INTRAVENOUS

## 2017-12-30 MED ORDER — KETOROLAC TROMETHAMINE 30 MG/ML IJ SOLN
30.0000 mg | Freq: Once | INTRAMUSCULAR | Status: AC
  Administered 2017-12-30: 30 mg via INTRAVENOUS

## 2017-12-30 MED ORDER — VANCOMYCIN HCL IN DEXTROSE 1-5 GM/200ML-% IV SOLN
1000.0000 mg | INTRAVENOUS | Status: AC
  Administered 2017-12-30: 1000 mg via INTRAVENOUS

## 2017-12-30 MED ORDER — MIDAZOLAM HCL 2 MG/2ML IJ SOLN
1.0000 mg | INTRAMUSCULAR | Status: DC | PRN
  Administered 2017-12-30 (×2): 0.5 mg via INTRAVENOUS

## 2017-12-30 MED ORDER — SODIUM CHLORIDE 0.9 % IV SOLN
Freq: Once | INTRAVENOUS | Status: AC
  Administered 2017-12-30: 08:00:00 via INTRAVENOUS

## 2017-12-30 NOTE — Discharge Instructions (Signed)
Vertebroplasty Post Procedure Discharge Instructions ° °1. May resume a regular diet and any medications that you routinely take (including pain medications). °2. No driving day of procedure. °3. Upon discharge go home and rest for at least 4 hours.  May use an ice pack as needed to injection sites on back.  Ice to back 30 minutes on and 30 minutes off, all day. °4. May remove bandaids tomorrow after taking a shower. Replace daily with clean bandaid until healed. °5. Do not lift anything heavier than a milk jug. °6. Follow up with your attending physician in 2 weeks. ° ° ° °Please contact our office at [phone] for the following symptoms: ° °· Fever greater than 100 degrees °· Increased swelling, pain, or redness at injection site. ° ° °Thank you for visiting [facility]. °

## 2018-01-11 ENCOUNTER — Other Ambulatory Visit: Payer: Self-pay | Admitting: Family Medicine

## 2018-01-11 NOTE — Telephone Encounter (Signed)
Call in #[phone] with 5 rf 

## 2018-01-11 NOTE — Telephone Encounter (Signed)
Sent to PCP for approval medication is not listed on pt's current medication list   Last OV 11/22/2017

## 2018-01-17 ENCOUNTER — Other Ambulatory Visit: Payer: Medicare Other

## 2018-01-20 ENCOUNTER — Ambulatory Visit
Admission: RE | Admit: 2018-01-20 | Discharge: 2018-01-20 | Disposition: A | Discharge status: Home / Self Care | Payer: Medicare Other | Source: Ambulatory Visit | Attending: Radiology | Admitting: Radiology

## 2018-01-20 DIAGNOSIS — Z09 Encounter for follow-up examination after completed treatment for conditions other than malignant neoplasm: Secondary | ICD-10-CM

## 2018-01-20 DIAGNOSIS — S22080A Wedge compression fracture of T11-T12 vertebra, initial encounter for closed fracture: Secondary | ICD-10-CM | POA: Diagnosis not present

## 2018-01-20 NOTE — Progress Notes (Signed)
Referring Physician(s): Belford Pascucci  Chief Complaint: The patient is seen in follow up today s/p vertebroplasty 12/30/17 for an osteoporotic compression fracture at T11  History of present illness:  She reports that her pain is only slightly improved since the procedure.  She rates her pain 5/10 compared to 7/10 prior to the procedure.  She continues to require the same level of assistance with ADLs.  She has minor radicular symptoms, unchanged.  She states that her pain is reasonable if she doesn't exert herself.  She now reports that she may have had another fall 2 days prior to the procedure.  She is complaining of right lateral rib pain.  Past Medical History:  Diagnosis Date  . Arthritis    lower back  . Bipolar disorder (HCC)   . Depression   . GERD (gastroesophageal reflux disease)   . Hiatal hernia   . Parkinson disease (HCC)   . Poor short-term memory   . Sagittal band rupture at metacarpophalangeal joint 11/2016   right long finger    Past Surgical History:  Procedure Laterality Date  . ANKLE SURGERY    . BALLOON DILATION N/A 10/29/2017   Procedure: BALLOON DILATION;  Surgeon: Beverley Fiedler, MD;  Location: [facility];  Service: Gastroenterology;  Laterality: N/A;  . CERVICAL DISC SURGERY  1994  . CESAREAN SECTION    . CHOLECYSTECTOMY    . CYST REMOVAL NECK    . ESOPHAGOGASTRODUODENOSCOPY (EGD) WITH ESOPHAGEAL DILATION  07/27/2012   with Propofol  . ESOPHAGOGASTRODUODENOSCOPY (EGD) WITH PROPOFOL N/A 06/23/2016   Procedure: ESOPHAGOGASTRODUODENOSCOPY (EGD) WITH PROPOFOL;  Surgeon: Beverley Fiedler, MD;  Location: [facility];  Service: Gastroenterology;  Laterality: N/A;  . ESOPHAGOGASTRODUODENOSCOPY (EGD) WITH PROPOFOL N/A 10/29/2017   Procedure: ESOPHAGOGASTRODUODENOSCOPY (EGD) WITH PROPOFOL;  Surgeon: Beverley Fiedler, MD;  Location: [facility];  Service: Gastroenterology;  Laterality: N/A;  . FOOT NEUROMA SURGERY Left   . ORIF DISTAL RADIUS FRACTURE  Right 07/04/2010  . SAVORY DILATION N/A 06/23/2016   Procedure: SAVORY DILATION;  Surgeon: Beverley Fiedler, MD;  Location: [facility];  Service: Gastroenterology;  Laterality: N/A;  . TENDON REPAIR Right 12/01/2016   Procedure: right long finger ulnar sagittal band reconstruction;  Surgeon: Betha Loa, MD;  Location: [facility];  Service: Orthopedics;  Laterality: Right;  . TONSILLECTOMY      Allergies: Celebrex [celecoxib]; Morphine and related; Penicillins; Risperdal [risperidone]; and Statins  Medications: Prior to Admission medications   Medication Sig Start Date End Date Taking? Authorizing Provider  ALPRAZolam (XANAX) 0.25 MG tablet Take 1 tablet (0.25 mg total) by mouth 3 (three) times daily as needed for anxiety. 12/23/17   [name], MD  AMBULATORY NON FORMULARY MEDICATION Medication Name: Domperidone 10 mg Take 1 tablet by mouth four times daily (before meals and at bedtime) Patient taking differently: Take 10 mg by mouth 3 (three) times daily. Medication Name: Domperidone 10 mg 08/09/17   Pyrtle, Carie Caddy, MD  buPROPion (WELLBUTRIN XL) 300 MG 24 hr tablet Take 300 mg by mouth daily.  03/27/16   [provider]  busPIRone (BUSPAR) 10 MG tablet Take 10 mg by mouth 3 (three) times daily.    [provider]  Calcium 500 MG CHEW Chew 500 mg by mouth at bedtime.     [provider]  CARAFATE 1 GM/10ML suspension Take 1 g by mouth 3 (three) times daily before meals. 08/10/17   [provider]  cyclobenzaprine (FLEXERIL) 10 MG tablet Take 1  tablet (10 mg total) by mouth 3 (three) times daily as needed for muscle spasms. 11/22/17   [name], MD  esomeprazole (NEXIUM) 40 MG capsule TAKE 1 CAPSULE TWICE A DAY BEFORE MEALS Patient taking differently: Take 40 mg by mouth 2 (two) times daily before a meal.  08/09/17   Pyrtle, Carie Caddy, MD  Eszopiclone 3 MG TABS Take 1 tablet (3 mg total) by mouth at bedtime. Take immediately before bedtime  09/29/17   [name], MD  ibuprofen (ADVIL,MOTRIN) 800 MG tablet TAKE 1 TABLET EVERY 8 HOURS AS NEEDED FOR PAIN Patient taking differently: TAKE 800 MG BY MOUTH EVERY 8 HOURS AS NEEDED FOR PAIN 06/25/17   [name], MD  LamoTRIgine XR (LAMICTAL XR) 200 MG TB24 Take 1 tablet (200 mg total) by mouth daily. 12/15/13   [name], MD  Magnesium 500 MG CAPS Take 500 mg by mouth at bedtime.     [provider]  Melatonin 5 MG CAPS Take 5 mg by mouth at bedtime.    [provider]  OSCIMIN 0.125 MG SUBL PLACE 1 TABLET UNDER THE TONGUE EVERY 6 HOURS AS NEEDED FOR CRAMPING Patient taking differently: PLACE 0.125 MG UNDER THE TONGUE EVERY 6 HOURS AS NEEDED FOR CRAMPING 02/09/17   Pyrtle, Carie Caddy, MD  oxyCODONE-acetaminophen (PERCOCET) 5-325 MG tablet Take 1 tablet by mouth every 6 (six) hours as needed for severe pain. 11/22/17   [name], MD  promethazine (PHENERGAN) 25 MG tablet Take 1 tablet (25 mg total) by mouth every 4 (four) hours as needed for nausea. 04/21/17   [name], MD  traMADol (ULTRAM) 50 MG tablet TAKE 2 TABLETS BY MOUTH EVERY 8 HOURS AS NEEDED FOR MODERATE PAIN 01/12/18   [name], MD     Family History  Problem Relation Age of Onset  . Emphysema Mother   . Heart attack Father   . Hypertension Unknown   . Lung cancer Unknown     Social History   Socioeconomic History  . Marital status: Widowed    Spouse name: Not on file  . Number of children: 4  . Years of education: Not on file  . Highest education level: Not on file  Social Needs  . Financial resource strain: Not on file  . Food insecurity - worry: Not on file  . Food insecurity - inability: Not on file  . Transportation needs - medical: Not on file  . Transportation needs - non-medical: Not on file  Occupational History  . Occupation: Retired    Associate Professor: RETIRED  Tobacco Use  . Smoking status: Never Smoker  . Smokeless tobacco: Never Used  Substance and Sexual Activity  .  Alcohol use: No    Alcohol/week: 0.0 oz  . Drug use: No  . Sexual activity: Not on file  Other Topics Concern  . Not on file  Social History Narrative   Daughter is her POA and must come to all visits in [facility] and [facility]      Vital Signs: BP (!) 159/70   Pulse 77   Temp 98.1 F (36.7 C)   Resp 16   SpO2 97%   Physical Exam The wound site has healed well.  She his mild tenderness to palpation at the T11 level.  Her pain is now located in the lower lumbar spine and made worse with palpation.  Imaging: No results found.  Labs:  CBC: Recent Labs    04/20/17 0518  12/21/17 1110  WBC 8.4 8.0  HGB 12.4 13.1  HCT 37.4 39.3  PLT 291 308.0    COAGS: No results for input(s): INR, APTT in the last 8760 hours.  BMP: Recent Labs    04/20/17 0518  NA 144  K 3.7  CL 109  CO2 27  GLUCOSE 99  BUN 19  CALCIUM 9.1  CREATININE 0.74  GFRNONAA >60  GFRAA >60    LIVER FUNCTION TESTS: No results for input(s): BILITOT, AST, ALT, ALKPHOS, PROT, ALBUMIN in the last 8760 hours.  Assessment and Plan:  1.  Healing T11 compression fracture status post vertebroplasty.  She seems to be doing well with pain at this level.  2.  New pain in the lower lumbar spine and lumbosacral junction.  She is content to watch this pain and let it continue to heal.  I encouraged her to follow-up with Dr. Clent Ridges if the pain is not improving as she may have sustained another fracture in the lumbar spine.  I did not see significant stenosis on her MRI 1/23.  MRI lumbar spine without contrast would be the modality of choice for evaluation of a new fracture.  3.  Given #1, I have not scheduled her for any further follow-up.  I would be happy to see her if she is found to have any new fractures or if she needs any type of lumbar steroid injection.  Electronically Signed: Jennika Ringgold W 01/20/2018, 3:21 PM   I spent a total of 15 Minutes in face to face in clinical consultation, greater than 50% of which  was counseling/coordinating care for Audrey Brown.    Patient ID: Audrey Brown, female   DOB: Mar 23, 1946, 72 y.o.   MRN: [id number]

## 2018-01-25 ENCOUNTER — Ambulatory Visit (INDEPENDENT_AMBULATORY_CARE_PROVIDER_SITE_OTHER): Payer: Medicare Other | Admitting: Family Medicine

## 2018-01-25 ENCOUNTER — Encounter: Payer: Self-pay | Admitting: Family Medicine

## 2018-01-25 VITALS — BP 130/84 | HR 80 | Temp 98.4°F

## 2018-01-25 DIAGNOSIS — M810 Age-related osteoporosis without current pathological fracture: Secondary | ICD-10-CM

## 2018-01-25 DIAGNOSIS — M5440 Lumbago with sciatica, unspecified side: Secondary | ICD-10-CM | POA: Diagnosis not present

## 2018-01-25 DIAGNOSIS — M8000XA Age-related osteoporosis with current pathological fracture, unspecified site, initial encounter for fracture: Secondary | ICD-10-CM | POA: Diagnosis not present

## 2018-01-25 NOTE — Progress Notes (Signed)
   Subjective:    Patient ID: Audrey Brown, female    DOB: December 14, 1945, 72 y.o.   MRN: [id number]  HPI Here for several weeks of severe low back pain. We saw here for mid back pain in January after several falls at home and an MRI on 12-01-17 revealed an acute compression fracture of T11 and an old healed on at L1. She had a vertebroplasty procedure about 3 weeks ago and this went well. Her mid back has dramatically improved. However since then this lower back pain flared up. No recent falls or trauma. Her last DEXA was in 2014 and it showed osteopenia.    Review of Systems  Constitutional: Negative.   Respiratory: Negative.   Cardiovascular: Negative.   Musculoskeletal: Positive for back pain.       Objective:   Physical Exam  Constitutional: She is oriented to person, place, and time. She appears well-developed and well-nourished.  Cardiovascular: Normal rate, regular rhythm, normal heart sounds and intact distal pulses.  Pulmonary/Chest: Effort normal and breath sounds normal. No respiratory distress. She has no wheezes. She has no rales.  Musculoskeletal:  The lower back is quite tender, the thoracic area is not tender   Neurological: She is alert and oriented to person, place, and time.          Assessment & Plan:  Her recent vertebroplasty for the T11 fracture was successful. Now she has a new lumbar pain. We will set up another lumbar MRI to evaluate this. Also set up a DEXA to follow the bone density.  Alysia Penna, MD

## 2018-01-31 ENCOUNTER — Inpatient Hospital Stay: Admission: RE | Admit: 2018-01-31 | Payer: Medicare Other | Source: Ambulatory Visit

## 2018-02-01 ENCOUNTER — Ambulatory Visit (INDEPENDENT_AMBULATORY_CARE_PROVIDER_SITE_OTHER)
Admission: RE | Admit: 2018-02-01 | Discharge: 2018-02-01 | Disposition: A | Discharge status: Home / Self Care | Payer: Medicare Other | Source: Ambulatory Visit | Attending: Family Medicine | Admitting: Family Medicine

## 2018-02-01 ENCOUNTER — Ambulatory Visit
Admission: RE | Admit: 2018-02-01 | Discharge: 2018-02-01 | Disposition: A | Discharge status: Home / Self Care | Payer: Medicare Other | Source: Ambulatory Visit | Attending: Family Medicine | Admitting: Family Medicine

## 2018-02-01 DIAGNOSIS — M810 Age-related osteoporosis without current pathological fracture: Secondary | ICD-10-CM | POA: Diagnosis not present

## 2018-02-01 DIAGNOSIS — M8000XA Age-related osteoporosis with current pathological fracture, unspecified site, initial encounter for fracture: Secondary | ICD-10-CM

## 2018-02-01 DIAGNOSIS — M5440 Lumbago with sciatica, unspecified side: Secondary | ICD-10-CM

## 2018-02-01 DIAGNOSIS — S32000S Wedge compression fracture of unspecified lumbar vertebra, sequela: Secondary | ICD-10-CM | POA: Diagnosis not present

## 2018-02-04 NOTE — Addendum Note (Signed)
Addended by: Alysia Penna A on: 02/04/2018 05:00 PM   Modules accepted: Orders

## 2018-02-08 ENCOUNTER — Telehealth: Payer: Self-pay

## 2018-02-08 MED ORDER — TEMAZEPAM 30 MG PO CAPS: 30.0000 mg | ORAL_CAPSULE | Freq: Every day | ORAL | 5 refills | Status: DC

## 2018-02-08 NOTE — Telephone Encounter (Signed)
Called pt and left a detailed VM regarding the following information below

## 2018-02-08 NOTE — Telephone Encounter (Signed)
For sleep, call in Temazepam 30 mg qhs., #30 with 5 rf. She can take OTC calcium 1500 mg daily and vitamin D 2000 units daily

## 2018-02-08 NOTE — Telephone Encounter (Signed)
Pt is requesting to have some medication call in to help her with sleep. Pt is having difficulty sleeping at night and staying asleep. Pt stated that she has tried [place] but this did NOT work for her. If able to send in something send to [facility] on [address]   Plus for pt's BD results pt was advised she had osteopenia and to continue calcium and vit d did she need to continue OTC supplement if so what dose plus did she need an Rx for 50,000 unit vit d?

## 2018-02-11 IMAGING — MR MR [PERSON_NAME]*[PERSON_NAME]* W/O CM
6 series · 40 of 40 positions shown · non-contrast
Comparison: None.

CLINICAL DATA: Right middle MCP joint pain and swelling for 4
months.

EXAM:
MRI OF THE RIGHT HAND WITHOUT CONTRAST
TECHNIQUE: Multiplanar, multisequence MR imaging of the right hand was
performed. No intravenous contrast was administered.

[Series 7: T2 fat-sat · sagittal · right · 1.0mm · 0.52mm/px · 7 of 25 slices shown]
[im 1/25]
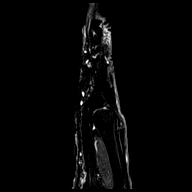
[im 5/25]
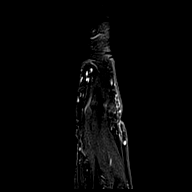
[im 9/25]
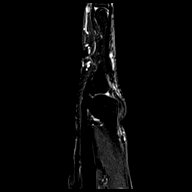
[im 13/25]
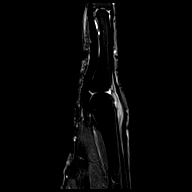
[im 17/25]
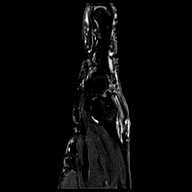
[im 21/25]
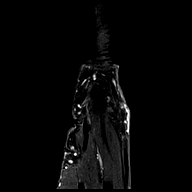
[im 25/25]
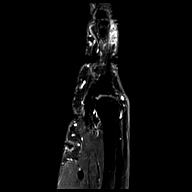

[Series 100: T2 · coronal · right · 2.0mm · 0.31mm/px · 5 of 19 slices shown (1 of 3)]
[im 1/19]
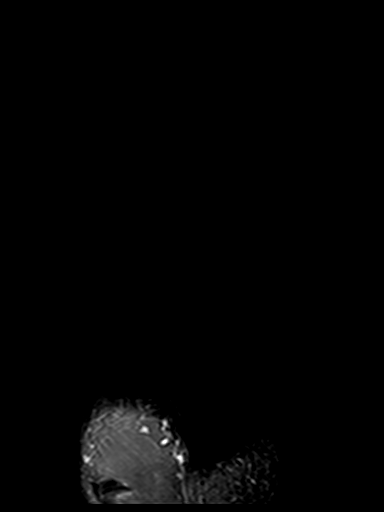
[im 5/19]
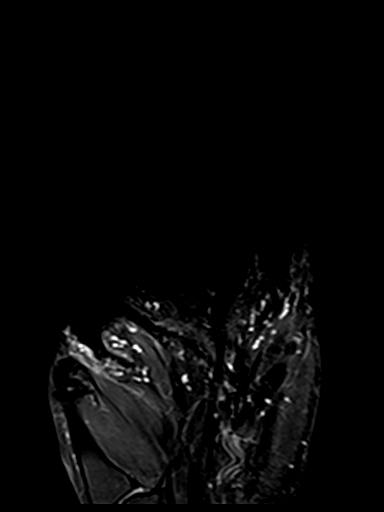
[im 10/19]
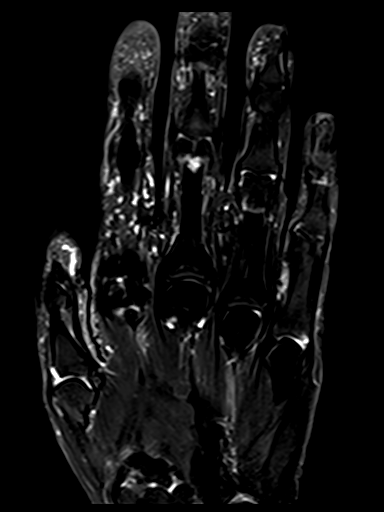
[im 14/19]
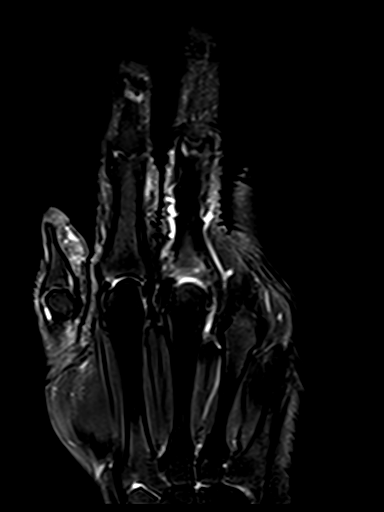
[im 19/19]
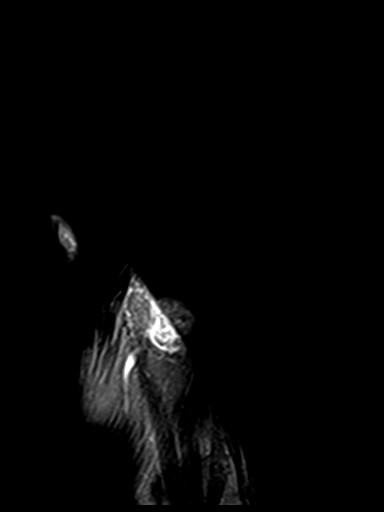

[Series 101: T1 · axial · right · 2.0mm · 0.31mm/px · z∈[-21,+59]mm · 10 of 35 slices shown]
[im 1/35]
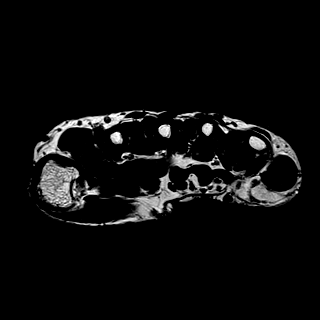
[im 4/35]
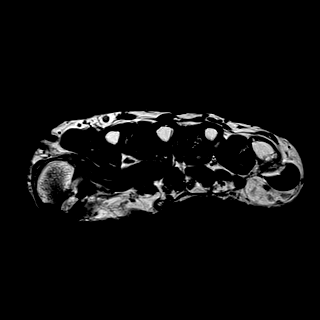
[im 8/35]
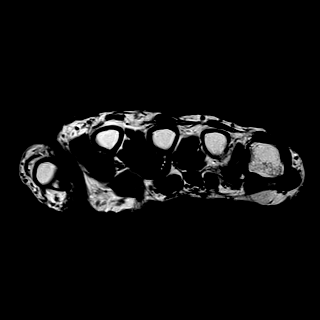
[im 12/35]
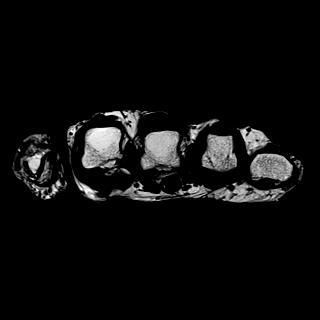
[im 16/35]
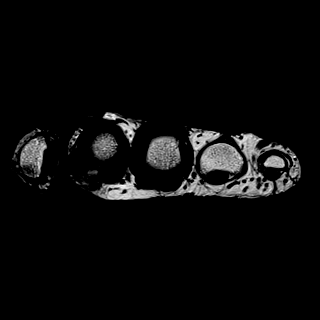
[im 19/35]
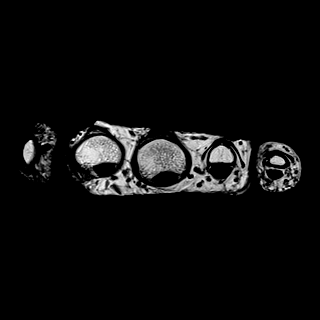
[im 23/35]
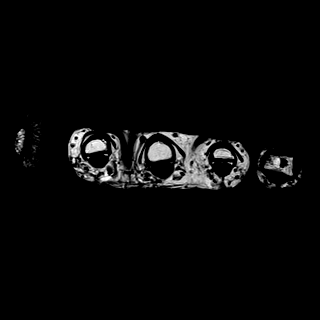
[im 27/35]
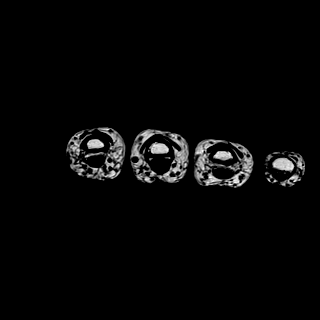
[im 31/35]
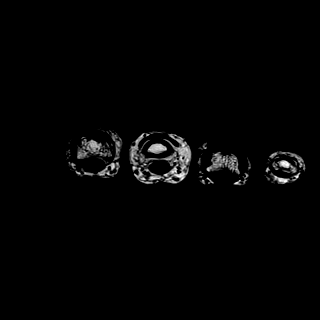
[im 35/35]
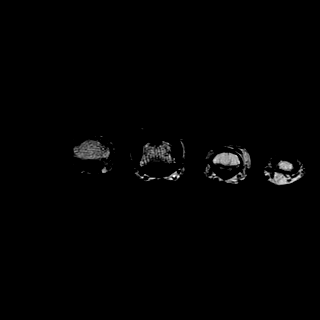

[Series 104: T2 · axial · right · 2.0mm · 0.31mm/px · z∈[-9,+36]mm · 5 of 20 slices shown (2 of 3)]
[im 1/20]
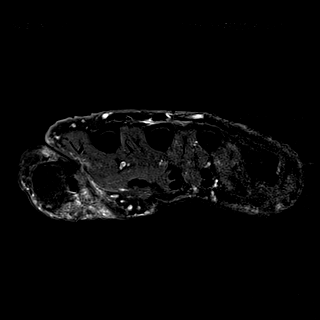
[im 5/20]
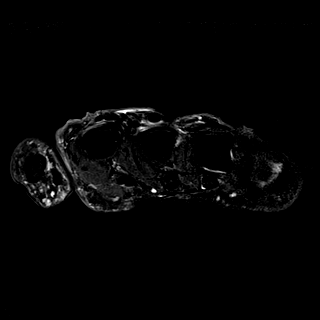
[im 10/20]
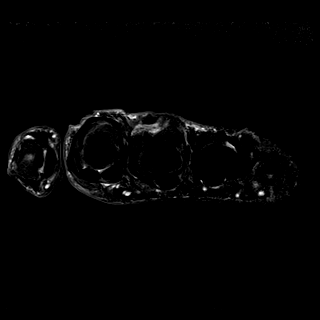
[im 15/20]
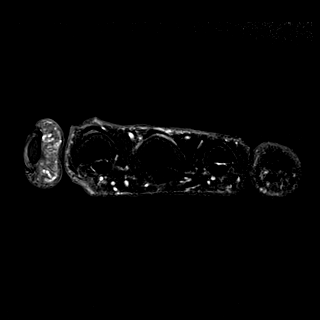
[im 20/20]
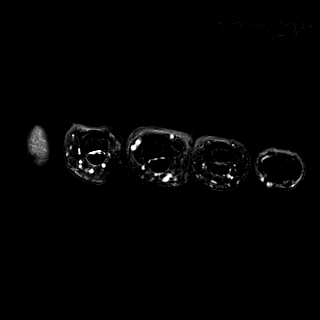

[Series 105: T2 · coronal · right · 1.0mm · 0.26mm/px · 7 of 25 slices shown (3 of 3)]
[im 1/25]
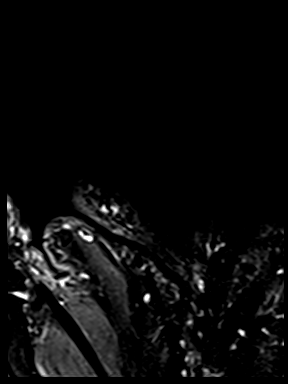
[im 5/25]
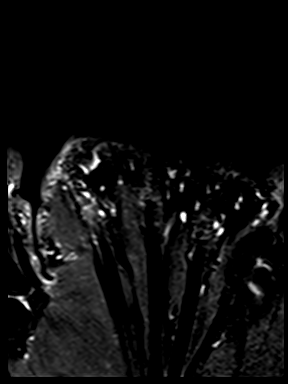
[im 9/25]
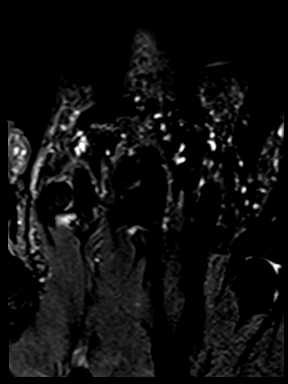
[im 13/25]
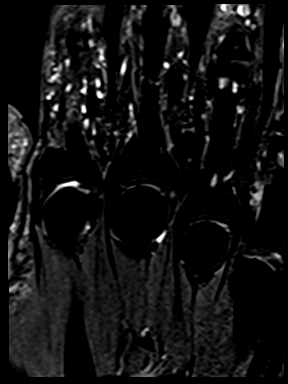
[im 17/25]
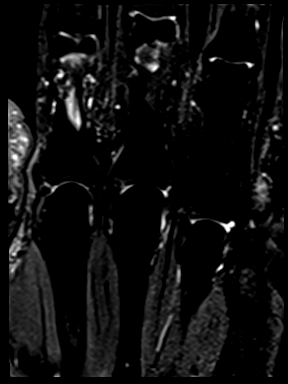
[im 21/25]
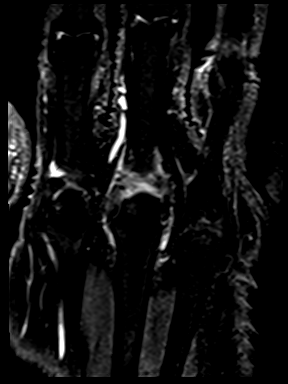
[im 25/25]
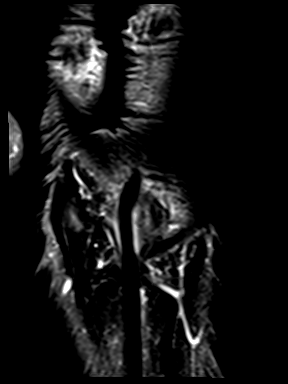

[Series 106: PD · coronal · right · 1.0mm · 0.52mm/px · 6 of 23 slices shown]
[im 1/23]
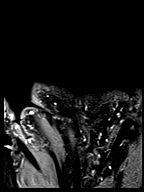
[im 5/23]
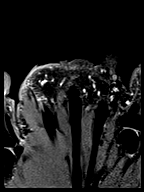
[im 9/23]
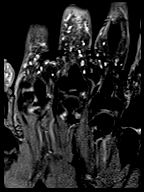
[im 14/23]
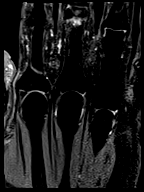
[im 18/23]
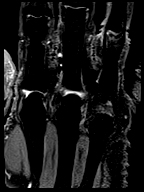
[im 23/23]
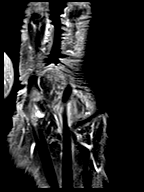

[40 of 40 positions shown; findings below may reference images not displayed]

FINDINGS: Bones/Joint/Cartilage

No marrow signal abnormality. No fracture or dislocation. Normal
alignment. No joint effusion.

Ligaments, Muscles and Tendons
Flexor and extensor compartment tendons are intact. Severe
thickening and indistinctness of the ulnar sagittal band concerning
for severe injury with a tear. Slight radial subluxation of the
third extensor communis tendon with surrounding synovitis.
Thickening of the radial sagittal band of the third MCP joint.

Sagittal bands of the remainder of the MCP joints are intact.

Soft tissue
No fluid collection or hematoma.  No soft tissue mass.
IMPRESSION: Severe thickening and indistinctness of the ulnar sagittal band
concerning for severe injury with a tear. Slight radial subluxation
of the third extensor communis tendon with surrounding synovitis.
Thickening of the radial sagittal band of the third MCP joint.

## 2018-02-11 NOTE — Telephone Encounter (Signed)
Called patient to ensure that she received vm regarding the medcation update. Call went to Vm.

## 2018-02-21 ENCOUNTER — Telehealth: Payer: Self-pay | Admitting: Internal Medicine

## 2018-02-21 MED ORDER — ESOMEPRAZOLE MAGNESIUM 40 MG PO CPDR: DELAYED_RELEASE_CAPSULE | ORAL | 1 refills | Status: DC

## 2018-02-21 MED ORDER — ESOMEPRAZOLE MAGNESIUM 40 MG PO CPDR: DELAYED_RELEASE_CAPSULE | ORAL | 0 refills | Status: DC

## 2018-02-21 NOTE — Telephone Encounter (Signed)
Patient states she needs mediation [facility]praz[facility] but she is out now and would also like a small amount sent into [facility]. Patient last seen 10.1.18

## 2018-02-21 NOTE — Telephone Encounter (Signed)
3 month rx for Nexium sent to [facility]. 1 month rx sent to [facility].

## 2018-03-11 ENCOUNTER — Telehealth: Payer: Self-pay

## 2018-03-11 NOTE — Telephone Encounter (Signed)
Fax from [facility] requesting a 90 day supply for Temazepam  Last OV 01/25/2018   Last refilled 02/08/2018 disp 30 with 5 refills   Sent to PCP for approval

## 2018-03-14 MED ORDER — TEMAZEPAM 30 MG PO CAPS: 30.0000 mg | ORAL_CAPSULE | Freq: Every day | ORAL | 1 refills | Status: DC

## 2018-03-14 NOTE — Telephone Encounter (Signed)
I sent this in to [facility]. Please call her local pharmacy to cancel those remaining refills

## 2018-03-14 NOTE — Telephone Encounter (Signed)
Called the pharmacy Rx has been cancelled

## 2018-04-01 ENCOUNTER — Encounter: Payer: Self-pay | Admitting: Family Medicine

## 2018-04-01 ENCOUNTER — Ambulatory Visit (INDEPENDENT_AMBULATORY_CARE_PROVIDER_SITE_OTHER): Payer: Medicare Other | Admitting: Family Medicine

## 2018-04-01 VITALS — BP 142/88 | HR 82 | Temp 98.3°F | Ht 63.0 in

## 2018-04-01 DIAGNOSIS — E538 Deficiency of other specified B group vitamins: Secondary | ICD-10-CM | POA: Diagnosis not present

## 2018-04-01 DIAGNOSIS — R5383 Other fatigue: Secondary | ICD-10-CM

## 2018-04-01 DIAGNOSIS — R7989 Other specified abnormal findings of blood chemistry: Secondary | ICD-10-CM

## 2018-04-01 DIAGNOSIS — G629 Polyneuropathy, unspecified: Secondary | ICD-10-CM | POA: Diagnosis not present

## 2018-04-01 DIAGNOSIS — G4700 Insomnia, unspecified: Secondary | ICD-10-CM

## 2018-04-01 LAB — CBC WITH DIFFERENTIAL/PLATELET
BASOS ABS: 0 10*3/uL (ref 0.0–0.1)
Basophils Relative: 0.4 % (ref 0.0–3.0)
Eosinophils Absolute: 0.2 10*3/uL (ref 0.0–0.7)
Eosinophils Relative: 2.7 % (ref 0.0–5.0)
HCT: 39.8 % (ref 36.0–46.0)
HEMOGLOBIN: 13.5 g/dL (ref 12.0–15.0)
LYMPHS ABS: 2.1 10*3/uL (ref 0.7–4.0)
Lymphocytes Relative: 30.3 % (ref 12.0–46.0)
MCHC: 33.9 g/dL (ref 30.0–36.0)
MCV: 88.8 fl (ref 78.0–100.0)
MONOS PCT: 8 % (ref 3.0–12.0)
Monocytes Absolute: 0.6 10*3/uL (ref 0.1–1.0)
NEUTROS PCT: 58.6 % (ref 43.0–77.0)
Neutro Abs: 4.1 10*3/uL (ref 1.4–7.7)
Platelets: 306 10*3/uL (ref 150.0–400.0)
RBC: 4.48 Mil/uL (ref 3.87–5.11)
RDW: 13.1 % (ref 11.5–15.5)
WBC: 6.9 10*3/uL (ref 4.0–10.5)

## 2018-04-01 LAB — BASIC METABOLIC PANEL
BUN: 15 mg/dL (ref 6–23)
CO2: 29 mEq/L (ref 19–32)
Calcium: 9.2 mg/dL (ref 8.4–10.5)
Chloride: 105 mEq/L (ref 96–112)
Creatinine, Ser: 0.9 mg/dL (ref 0.40–1.20)
GFR: 65.86 mL/min (ref >60.00)
GLUCOSE: 104 mg/dL — AB (ref 70–99)
POTASSIUM: 3.9 meq/L (ref 3.5–5.1)
Sodium: 142 mEq/L (ref 135–145)

## 2018-04-01 LAB — HEPATIC FUNCTION PANEL
ALBUMIN: 4.1 g/dL (ref 3.5–5.2)
ALT: 11 U/L (ref 0–35)
AST: 12 U/L (ref 0–37)
Alkaline Phosphatase: 62 U/L (ref 39–117)
BILIRUBIN TOTAL: 0.5 mg/dL (ref 0.2–1.2)
Bilirubin, Direct: 0.1 mg/dL (ref 0.0–0.3)
Total Protein: 6.3 g/dL (ref 6.0–8.3)

## 2018-04-01 LAB — VITAMIN B12: VITAMIN B 12: 415 pg/mL (ref 211–911)

## 2018-04-01 LAB — T3, FREE: T3, Free: 4.4 pg/mL — ABNORMAL HIGH (ref 2.3–4.2)

## 2018-04-01 LAB — TSH: TSH: 1.83 u[IU]/mL (ref 0.35–4.50)

## 2018-04-01 LAB — T4, FREE: FREE T4: 0.97 ng/dL (ref 0.60–1.60)

## 2018-04-01 MED ORDER — ESZOPICLONE 3 MG PO TABS: 3.0000 mg | ORAL_TABLET | Freq: Every day | ORAL | 1 refills | Status: DC

## 2018-04-01 MED ORDER — ESOMEPRAZOLE MAGNESIUM 40 MG PO CPDR: DELAYED_RELEASE_CAPSULE | ORAL | 3 refills | Status: DC

## 2018-04-01 NOTE — Progress Notes (Signed)
   Subjective:    Patient ID: Audrey Brown, female    DOB: September 23, 1946, 72 y.o.   MRN: [id number]  HPI Here for med refills and also for several issues. She feels tired all the time, she has gained some weight, and her hair is thinning. She asks to check her thyroid level. Also she has tingling and numbness in both feet.    Review of Systems  Constitutional: Positive for fatigue and unexpected weight change.  Respiratory: Negative.   Cardiovascular: Negative.   Neurological: Positive for numbness.       Objective:   Physical Exam  Constitutional: She is oriented to person, place, and time. She appears well-developed and well-nourished.  Neck: No thyromegaly present.  Cardiovascular: Normal rate, regular rhythm, normal heart sounds and intact distal pulses.  Pulmonary/Chest: Effort normal and breath sounds normal.  Lymphadenopathy:    She has no cervical adenopathy.  Neurological: She is alert and oriented to person, place, and time.          Assessment & Plan:  For insomnia, refill Lunesta. For the fatigue check a thyroid panel and other labs. For the neuropathy check a B12 level.  Alysia Penna, MD

## 2018-06-22 ENCOUNTER — Other Ambulatory Visit: Payer: Self-pay | Admitting: Family Medicine

## 2018-06-22 ENCOUNTER — Other Ambulatory Visit: Payer: Self-pay | Admitting: *Deleted

## 2018-06-22 MED ORDER — CYCLOBENZAPRINE HCL 10 MG PO TABS: 10.0000 mg | ORAL_TABLET | Freq: Three times a day (TID) | ORAL | 2 refills | Status: DC | PRN

## 2018-06-22 NOTE — Telephone Encounter (Signed)
Sent to the pharmacy by [facility]. 

## 2018-06-22 NOTE — Telephone Encounter (Signed)
Last OV: 04/01/18 Last filled: 11/22/17, #60, 2 RF   Sig: Take 1 tablet (10 mg total) by mouth 3 (three) times daily as needed for muscle spasms.   This was initially prescribed in January for treatment of LBP after a fall at home. Okay for refill?

## 2018-07-05 ENCOUNTER — Telehealth: Payer: Self-pay

## 2018-07-05 MED ORDER — CYCLOBENZAPRINE HCL 10 MG PO TABS: 10.0000 mg | ORAL_TABLET | Freq: Three times a day (TID) | ORAL | 3 refills | Status: DC | PRN

## 2018-07-05 NOTE — Telephone Encounter (Signed)
Patient has been notified

## 2018-07-05 NOTE — Telephone Encounter (Signed)
Called and spoke with pt. Pt stated that she would like to switch to mail order.   Called [facility] to have prescription canceled.   Sent to PCP to resend Rx to [facility]

## 2018-07-05 NOTE — Telephone Encounter (Signed)
Fax from [facility]   Refill request for 90 day supply   * CYCLOBENZAPRINE*  Last refilled 06/22/2018 disp 90 with 2 refills   Will need to call pt to see if they would like to switch Rx to [facility] or keep it at [facility]?

## 2018-07-05 NOTE — Telephone Encounter (Signed)
Done

## 2018-07-27 ENCOUNTER — Other Ambulatory Visit: Payer: Self-pay | Admitting: Family Medicine

## 2018-07-27 DIAGNOSIS — Z1231 Encounter for screening mammogram for malignant neoplasm of breast: Secondary | ICD-10-CM

## 2018-08-23 ENCOUNTER — Ambulatory Visit
Admission: RE | Admit: 2018-08-23 | Discharge: 2018-08-23 | Disposition: A | Discharge status: Home / Self Care | Payer: Medicare Other | Source: Ambulatory Visit | Attending: Family Medicine | Admitting: Family Medicine

## 2018-08-23 ENCOUNTER — Encounter: Payer: Self-pay | Admitting: Family Medicine

## 2018-08-23 ENCOUNTER — Ambulatory Visit (INDEPENDENT_AMBULATORY_CARE_PROVIDER_SITE_OTHER): Payer: Medicare Other | Admitting: Family Medicine

## 2018-08-23 VITALS — BP 126/74 | HR 85 | Temp 98.4°F

## 2018-08-23 DIAGNOSIS — Z23 Encounter for immunization: Secondary | ICD-10-CM

## 2018-08-23 DIAGNOSIS — G44019 Episodic cluster headache, not intractable: Secondary | ICD-10-CM | POA: Diagnosis not present

## 2018-08-23 DIAGNOSIS — Z1231 Encounter for screening mammogram for malignant neoplasm of breast: Secondary | ICD-10-CM | POA: Diagnosis not present

## 2018-08-23 LAB — BASIC METABOLIC PANEL
BUN: 13 mg/dL (ref 6–23)
CHLORIDE: 107 meq/L (ref 96–112)
CO2: 28 mEq/L (ref 19–32)
Calcium: 9.3 mg/dL (ref 8.4–10.5)
Creatinine, Ser: 0.85 mg/dL (ref 0.40–1.20)
GFR: 70.27 mL/min (ref >60.00)
GLUCOSE: 96 mg/dL (ref 70–99)
POTASSIUM: 4.2 meq/L (ref 3.5–5.1)
SODIUM: 145 meq/L (ref 135–145)

## 2018-08-23 MED ORDER — OXYCODONE-ACETAMINOPHEN 10-325 MG PO TABS: 1.0000 | ORAL_TABLET | ORAL | 0 refills | Status: DC | PRN

## 2018-08-23 NOTE — Addendum Note (Signed)
Addended by: Elie Confer on: 08/23/2018 10:05 AM   Modules accepted: Orders

## 2018-08-23 NOTE — Progress Notes (Signed)
   Subjective:    Patient ID: Audrey Brown, female    DOB: [birth year], 72 y.o.   MRN: [id number]  HPI Here to discuss headaches that started about one year ago. They have become more frequent and now she averages one a week. They usually last for several hours. If she takes some pain medication and goes to sleep, the headache is usually gone when she wakes up. These are very predictable, though she cannot identify any particular triggers. The pain begins quite suddenly and she says "it's like a switch goes off". She gets severe stabbing pains in the right side of the head and behind the right eye. Her vision in the right eye becomes blurred and she tears a lot in the right eye eye. The left side of her head is never involved. Lights and noises do not seem to affect these headaches. She gets nauseated with them but does not vomit.    Review of Systems  Constitutional: Negative.   HENT: Negative.   Eyes: Positive for visual disturbance.  Respiratory: Negative.   Cardiovascular: Negative.   Neurological: Positive for headaches.       Objective:   Physical Exam  Constitutional: She is oriented to person, place, and time. She appears well-developed and well-nourished. No distress.  HENT:  Right Ear: External ear normal.  Left Ear: External ear normal.  Nose: Nose normal.  Mouth/Throat: Oropharynx is clear and moist.  Eyes: Pupils are equal, round, and reactive to light. Conjunctivae and EOM are normal.  Neck: Neck supple. No thyromegaly present.  Cardiovascular: Normal rate, regular rhythm, normal heart sounds and intact distal pulses.  Pulmonary/Chest: Effort normal and breath sounds normal.  Lymphadenopathy:    She has no cervical adenopathy.  Neurological: She is alert and oriented to person, place, and time. No cranial nerve deficit.          Assessment & Plan:  These are classic cluster headaches. She can use Percocet as needed. We will set up a contrasted brain MRI soon  and will refer her to Neurology. Alysia Penna, MD

## 2018-08-24 ENCOUNTER — Encounter: Payer: Self-pay | Admitting: *Deleted

## 2018-08-30 ENCOUNTER — Encounter: Payer: Self-pay | Admitting: Neurology

## 2018-09-12 ENCOUNTER — Encounter: Payer: Self-pay | Admitting: Family Medicine

## 2018-09-12 ENCOUNTER — Ambulatory Visit (INDEPENDENT_AMBULATORY_CARE_PROVIDER_SITE_OTHER): Payer: Medicare Other

## 2018-09-12 ENCOUNTER — Ambulatory Visit (INDEPENDENT_AMBULATORY_CARE_PROVIDER_SITE_OTHER): Payer: Medicare Other | Admitting: Family Medicine

## 2018-09-12 ENCOUNTER — Ambulatory Visit: Payer: Medicare Other | Admitting: Family Medicine

## 2018-09-12 ENCOUNTER — Other Ambulatory Visit: Payer: Self-pay | Admitting: Family Medicine

## 2018-09-12 ENCOUNTER — Telehealth: Payer: Self-pay | Admitting: Internal Medicine

## 2018-09-12 VITALS — BP 124/80 | HR 97 | Temp 98.0°F

## 2018-09-12 DIAGNOSIS — R0781 Pleurodynia: Secondary | ICD-10-CM

## 2018-09-12 DIAGNOSIS — M545 Low back pain, unspecified: Secondary | ICD-10-CM

## 2018-09-12 DIAGNOSIS — R0789 Other chest pain: Secondary | ICD-10-CM

## 2018-09-12 DIAGNOSIS — M546 Pain in thoracic spine: Secondary | ICD-10-CM | POA: Diagnosis not present

## 2018-09-12 DIAGNOSIS — M25552 Pain in left hip: Secondary | ICD-10-CM

## 2018-09-12 DIAGNOSIS — S299XXA Unspecified injury of thorax, initial encounter: Secondary | ICD-10-CM | POA: Diagnosis not present

## 2018-09-12 DIAGNOSIS — S79912A Unspecified injury of left hip, initial encounter: Secondary | ICD-10-CM | POA: Diagnosis not present

## 2018-09-12 MED ORDER — AMBULATORY NON FORMULARY MEDICATION: 10.0000 mg | Freq: Three times a day (TID) | 0 refills | Status: DC

## 2018-09-12 NOTE — Telephone Encounter (Signed)
Pt needs rf for domperidone sent to [place] [facility] online. She stated that we have sent it there before.

## 2018-09-12 NOTE — Telephone Encounter (Signed)
Rx sent to [facility]. Patient needs office visit for further refills.

## 2018-09-12 NOTE — Patient Instructions (Signed)
I will call you with xray results once they arrive. Please continue to take pain medications as needed until further direction.

## 2018-09-12 NOTE — Progress Notes (Signed)
Audrey Brown DOB: 1946/01/31 Encounter date: 09/12/2018  This is a 71 y.o. female who presents with Chief Complaint  Patient presents with  . Pain    left side rib pain, lower back pain left side, fell one week ago, pain started 3 days after fall, pain started as a "sore" feeling and got worse each day, 8/10 pain scale,    History of present illness:  Fall 1 week ago; 3 days later started with complaints of back.   Was changing clothes and tangled in pants. Thinks she landed on left side and landed on floor. Nothing hurt right away. Just angry about fall. Got self back up.   Lower spine and then left lateral ribs. Feels a little swollen. Taking ibuprofen and has taken tramadol which has helped a little.   No trouble with breathing.   Pain along mid to lower spine; pain in left rib cage (most severe): not painful with breathing.   Tramadol did bring pain down to 5/10.       Allergies  Allergen Reactions  . Celebrex [Celecoxib] Hives  . Morphine And Related Hives  . Penicillins Hives, Nausea Only and Other (See Comments)    Has patient had a PCN reaction causing immediate rash, facial/tongue/throat swelling, SOB or lightheadedness with hypotension: Yes Has patient had a PCN reaction causing severe rash involving mucus membranes or skin necrosis: Yes Has patient had a PCN reaction that required hospitalization: Yes, was already in the hospital  Has patient had a PCN reaction occurring within the last 10 years: No If all of the above answers are "NO", then may proceed with Cephalosporin use.   Marland Kitchen Risperdal [Risperidone] Other (See Comments)    Tremors and seizures  . Statins Other (See Comments)    MUSCLE ACHES   Current Meds  Medication Sig  . ALPRAZolam (XANAX) 0.25 MG tablet Take 1 tablet (0.25 mg total) by mouth 3 (three) times daily as needed for anxiety.  . AMBULATORY NON FORMULARY MEDICATION Medication Name: Domperidone 10 mg Take 1 tablet by mouth four times  daily (before meals and at bedtime) (Patient taking differently: Take 10 mg by mouth 3 (three) times daily. Medication Name: Domperidone 10 mg)  . buPROPion (WELLBUTRIN XL) 300 MG 24 hr tablet Take 300 mg by mouth daily.   . busPIRone (BUSPAR) 10 MG tablet Take 20 mg by mouth 3 (three) times daily.   . Calcium 500 MG CHEW Chew 500 mg by mouth at bedtime.   Marland Kitchen CARAFATE 1 GM/10ML suspension Take 1 g by mouth 3 (three) times daily before meals.  . cholecalciferol (VITAMIN D) 1000 units tablet Take 1,000 Units by mouth daily.  . cyclobenzaprine (FLEXERIL) 10 MG tablet Take 1 tablet (10 mg total) by mouth 3 (three) times daily as needed for muscle spasms.  Marland Kitchen esomeprazole (NEXIUM) 40 MG capsule TAKE 1 CAPSULE TWICE A DAY BEFORE MEALS  . Eszopiclone 3 MG TABS Take 1 tablet (3 mg total) by mouth at bedtime. Take immediately before bedtime  . ibuprofen (ADVIL,MOTRIN) 800 MG tablet TAKE 1 TABLET EVERY 8 HOURS AS NEEDED FOR PAIN  . LamoTRIgine XR (LAMICTAL XR) 200 MG TB24 Take 1 tablet (200 mg total) by mouth daily.  . Magnesium 500 MG CAPS Take 500 mg by mouth at bedtime.   . Melatonin 5 MG CAPS Take 5 mg by mouth at bedtime.  . OSCIMIN 0.125 MG SUBL PLACE 1 TABLET UNDER THE TONGUE EVERY 6 HOURS AS NEEDED FOR CRAMPING (Patient taking  differently: PLACE 0.125 MG UNDER THE TONGUE EVERY 6 HOURS AS NEEDED FOR CRAMPING)  . oxyCODONE-acetaminophen (PERCOCET) 10-325 MG tablet Take 1 tablet by mouth every 4 (four) hours as needed for pain.  . promethazine (PHENERGAN) 25 MG tablet Take 1 tablet (25 mg total) by mouth every 4 (four) hours as needed for nausea.  . traMADol (ULTRAM) 50 MG tablet TAKE 2 TABLETS BY MOUTH EVERY 8 HOURS AS NEEDED FOR MODERATE PAIN    Review of Systems  Constitutional: Negative for chills and fever.  Respiratory: Negative for cough and shortness of breath.   Cardiovascular: Negative for chest pain.  Gastrointestinal: Negative for abdominal pain.  Musculoskeletal: Positive for  arthralgias (left hip), back pain and gait problem.  Skin: Negative for color change.  Neurological: Positive for headaches (has been getting cluster headaches).    Objective:  BP 124/80 (BP Location: Left Arm, Patient Position: Sitting, Cuff Size: Normal)   Pulse 97   Temp 98 F (36.7 C) (Oral)       BP Readings from Last 3 Encounters:  09/12/18 124/80  08/23/18 126/74  04/01/18 (!) 142/88   Wt Readings from Last 3 Encounters:  11/05/17 175 lb (79.4 kg)  11/03/17 170 lb (77.1 kg)  10/29/17 170 lb (77.1 kg)    Physical Exam  Constitutional: She is oriented to person, place, and time. She appears well-developed and well-nourished. No distress.  Cardiovascular: Normal rate, regular rhythm and normal heart sounds. Exam reveals no friction rub.  No murmur heard. Pulmonary/Chest: Effort normal and breath sounds normal. No respiratory distress. She has no decreased breath sounds. She has no wheezes. She has no rales.  Musculoskeletal:  Very tender to light touch entire left ribcage. No abdominal tenderness. No significant bruising noted.   Tender to touch along thoracic-lumbar spine. No noted bruising or edema. Hx of compression fx with fall; feels similar pain.  Pain in left SI area with hip flexion on left (not on right); slight discomfort with lateral movement. Baseline weakness with pain upon standing. Difficult to assess. In wheelchair. She is able to weight bear (mostly on right) and there is not obvious bruising left hip. There is some induration/edema palpable left SI/lateral left hip area.     Neurological: She is alert and oriented to person, place, and time.  Psychiatric: Her behavior is normal. Cognition and memory are normal.    Assessment/Plan  1. Acute midline low back pain, unspecified whether sciatica present Ok to continue with pain medication as needed (they use stepwise plan with ibuprofen, then tramadol, then oxycodone if very severe) while awaiting imaging  results.  - DG Lumbar Spine Complete; Future - DG Thoracic Spine 2 View; Future  2. Rib pain on left side - DG Ribs Unilateral Left; Future  3. Left hip pain  - XR HIP UNILAT W OR W/O PELVIS 2-3 VIEWS LEFT   Return if symptoms worsen or fail to improve.      Micheline Rough, MD

## 2018-09-14 ENCOUNTER — Encounter: Payer: Self-pay | Admitting: Family Medicine

## 2018-09-14 ENCOUNTER — Other Ambulatory Visit: Payer: Self-pay

## 2018-09-14 ENCOUNTER — Ambulatory Visit (INDEPENDENT_AMBULATORY_CARE_PROVIDER_SITE_OTHER): Payer: Medicare Other | Admitting: Family Medicine

## 2018-09-14 VITALS — BP 130/90 | HR 94 | Temp 99.0°F

## 2018-09-14 DIAGNOSIS — K5900 Constipation, unspecified: Secondary | ICD-10-CM | POA: Diagnosis not present

## 2018-09-14 DIAGNOSIS — K30 Functional dyspepsia: Secondary | ICD-10-CM | POA: Diagnosis not present

## 2018-09-14 DIAGNOSIS — R0781 Pleurodynia: Secondary | ICD-10-CM

## 2018-09-14 DIAGNOSIS — K219 Gastro-esophageal reflux disease without esophagitis: Secondary | ICD-10-CM

## 2018-09-14 DIAGNOSIS — R0789 Other chest pain: Secondary | ICD-10-CM

## 2018-09-14 NOTE — Patient Instructions (Signed)
Try to leave off the Flexeril and Percocet  Try OTC Miralax once or twice daily as needed.

## 2018-09-14 NOTE — Progress Notes (Signed)
Subjective:     Patient ID: Audrey Brown, female   DOB: 22-Mar-1946, 72 y.o.   MRN: [id number]  HPI Patient has multiple chronic problems including Parkinson's disease and chronic gastroparesis.  She fell about a week ago with contusion left rib cage and presents today with multiple symptoms including constipation, bloated feeling lower abdominal region (bilateral), dry heaves, progressive dysphagia.  She was seen here on the fourth and had multiple x-rays done including left ribs, thoracic films, lumbar films, and pelvis and hip with no fracture noted.  She still has some left rib cage pain.  She has been taking some Flexeril and Percocet.  She also takes antispasmodic medication (Oscimin) periodically.  She did have bowel movement yesterday but this was very small caliber.  She has tried some Senokot without relief.  Her gastroparesis is chronic.  She has had dysphagia in the past with reported esophageal dilatation.  Denies any fevers or chills.  No dysuria.  Chronic problems include history of GERD with stricture, Parkinson's syndrome, cognitive impairment, history of recurrent depression, reported history of bipolar 1 disorder, and chronic insomnia.  Past Medical History:  Diagnosis Date  . Arthritis    lower back  . Bipolar disorder ([place])   . Depression   . GERD (gastroesophageal reflux disease)   . Hiatal hernia   . Parkinson disease ([place])   . Poor short-term memory   . Sagittal band rupture at metacarpophalangeal joint 11/2016   right long finger   Past Surgical History:  Procedure Laterality Date  . ANKLE SURGERY    . BALLOON DILATION N/A 10/29/2017   Procedure: BALLOON DILATION;  Surgeon: Jerene Bears, MD;  Location: [facility];  Service: Gastroenterology;  Laterality: N/A;  . CERVICAL [place]  . CESAREAN SECTION    . CHOLECYSTECTOMY    . CYST REMOVAL NECK    . ESOPHAGOGASTRODUODENOSCOPY (EGD) WITH ESOPHAGEAL DILATION  07/27/2012   with Propofol  .  ESOPHAGOGASTRODUODENOSCOPY (EGD) WITH PROPOFOL N/A 06/23/2016   Procedure: ESOPHAGOGASTRODUODENOSCOPY (EGD) WITH PROPOFOL;  Surgeon: Jerene Bears, MD;  Location: [facility];  Service: Gastroenterology;  Laterality: N/A;  . ESOPHAGOGASTRODUODENOSCOPY (EGD) WITH PROPOFOL N/A 10/29/2017   Procedure: ESOPHAGOGASTRODUODENOSCOPY (EGD) WITH PROPOFOL;  Surgeon: Jerene Bears, MD;  Location: [facility];  Service: Gastroenterology;  Laterality: N/A;  . FOOT NEUROMA SURGERY Left   . ORIF DISTAL RADIUS FRACTURE Right 07/04/2010  . SAVORY DILATION N/A 06/23/2016   Procedure: SAVORY DILATION;  Surgeon: Jerene Bears, MD;  Location: [facility];  Service: Gastroenterology;  Laterality: N/A;  . TENDON REPAIR Right 12/01/2016   Procedure: right long finger ulnar sagittal band reconstruction;  Surgeon: Leanora Cover, MD;  Location: [place];  Service: Orthopedics;  Laterality: Right;  . TONSILLECTOMY      reports that she has never smoked. She has never used smokeless tobacco. She reports that she does not drink alcohol or use drugs. family history includes Emphysema in her mother; Heart attack in her father; Hypertension in her unknown relative; Lung cancer in her unknown relative. Allergies  Allergen Reactions  . Celebrex [Celecoxib] Hives  . Morphine And Related Hives  . Penicillins Hives, Nausea Only and Other (See Comments)    Has patient had a PCN reaction causing immediate rash, facial/tongue/throat swelling, SOB or lightheadedness with hypotension: Yes Has patient had a PCN reaction causing severe rash involving mucus membranes or skin necrosis: Yes Has patient had a PCN reaction that required hospitalization: Yes, was already in  the hospital  Has patient had a PCN reaction occurring within the last 10 years: No If all of the above answers are "NO", then may proceed with Cephalosporin use.   Marland Kitchen Risperdal [Risperidone] Other (See Comments)    Tremors and seizures  . Statins Other (See  Comments)    MUSCLE ACHES     Review of Systems  Constitutional: Positive for appetite change. Negative for chills and fever.  Respiratory: Negative for cough and shortness of breath.   Cardiovascular: Negative for chest pain.  Gastrointestinal: Positive for abdominal distention, constipation, nausea and vomiting. Negative for blood in stool and diarrhea.  Genitourinary: Negative for dysuria.  Skin: Negative for rash.       Objective:   Physical Exam  Constitutional: She appears well-developed and well-nourished.  Cardiovascular: Normal rate and regular rhythm.  Pulmonary/Chest: Effort normal and breath sounds normal.  Abdominal: Soft. There is no tenderness. There is no rebound and no guarding.  Neurological: She is alert.       Assessment:     #1 constipation.  Patient is on several medications that could be worsening constipation including Flexeril, Percocet (not taking regularly), and Oscimin.  She has been having small caliber bowel movements including most recently yesterday  #2 history of chronic nausea and vomiting associated with gastroparesis  #3 recent fall with left rib cage pain but no evidence for fracture  #4 history of GERD with esophageal stricture and recent increased dysphagia symptoms    Plan:     -Continue close follow-up with GI -Leave off Flexeril and Percocet as much as possible.  Avoid Oscimin-if possible -We recommend trial of MiraLAX once or twice daily as needed for constipation symptoms. -Stay well-hydrated -Touch base of bowel movements not improving over the next couple of days -She has Phenergan to take at home for nausea as needed  Eulas Post MD [facility]

## 2018-09-20 ENCOUNTER — Other Ambulatory Visit: Payer: Medicare Other

## 2018-09-20 ENCOUNTER — Other Ambulatory Visit: Payer: Self-pay | Admitting: Family Medicine

## 2018-09-20 ENCOUNTER — Encounter: Payer: Self-pay | Admitting: Gastroenterology

## 2018-09-20 ENCOUNTER — Ambulatory Visit (INDEPENDENT_AMBULATORY_CARE_PROVIDER_SITE_OTHER): Payer: Medicare Other | Admitting: Gastroenterology

## 2018-09-20 ENCOUNTER — Telehealth: Payer: Self-pay | Admitting: Internal Medicine

## 2018-09-20 VITALS — BP 120/74 | HR 76

## 2018-09-20 DIAGNOSIS — K5909 Other constipation: Secondary | ICD-10-CM | POA: Diagnosis not present

## 2018-09-20 DIAGNOSIS — R1310 Dysphagia, unspecified: Secondary | ICD-10-CM | POA: Diagnosis not present

## 2018-09-20 NOTE — Progress Notes (Addendum)
09/20/2018 Audrey Brown [id number] [birth year]   HISTORY OF PRESENT ILLNESS:  This is a 72 yo female with PMH of Parkinson's disease, GERD, hiatal hernia, IBS, gastroparesis who is here for follow-up. She is here today with her daughter.  She is a patient of Dr. Vena Rua and was last seen here for EGD in 10/2017 at which time EGD showed an essentially normal study except a 4 cm hiatal hernia.  He empirically dilated her esophagus with balloon dilator to 18 mm.  She is here again today with complaints of dysphagia.  Reports improvement in her dysphasia following that dilation in 2018.  Now describes food getting caught in the back of her throat.  Symptoms have been returning again for a while, but much worse over the past week.  She has had 2 modified barium swallow studies over the past 2 years, the first showing mild oral, mild pharyngeal, and mild cervical dysphasia and the one just last year showed improvement in those issues.  She is actually seeing neurology in the next month or so for possibly worsening of her Parkinson's symptoms.  Her last 2 EGDs were performed at [facility], but I am not sure of the reasoning behind doing in the hospital versus the [facility].  She is in a wheelchair but is able to at least stand/pivot and is able to walk short distances.  She also reports constipation.  Says that they had family visiting for a couple of weeks and her diet was terrible while they were here.  Says that she has not had a good BM in about a week, but passing marble size pieces.  Feels like food is just sitting and not moving through.  Tried Maalox, Alka-Seltzer, and takes her senna regularly.  Has taken a couple doses of MiraLAX over the past couple of days without good results.   Past Medical History:  Diagnosis Date  . Arthritis    lower back  . Bipolar disorder ([place])   . Depression   . GERD (gastroesophageal reflux disease)   . Hiatal hernia   . Parkinson disease ([place])   .  Poor short-term memory   . Sagittal band rupture at metacarpophalangeal joint 11/2016   right long finger   Past Surgical History:  Procedure Laterality Date  . ANKLE SURGERY    . BALLOON DILATION N/A 10/29/2017   Procedure: BALLOON DILATION;  Surgeon: Jerene Bears, MD;  Location: [facility];  Service: Gastroenterology;  Laterality: N/A;  . CERVICAL [place]  . CESAREAN SECTION    . CHOLECYSTECTOMY    . CYST REMOVAL NECK    . ESOPHAGOGASTRODUODENOSCOPY (EGD) WITH ESOPHAGEAL DILATION  07/27/2012   with Propofol  . ESOPHAGOGASTRODUODENOSCOPY (EGD) WITH PROPOFOL N/A 06/23/2016   Procedure: ESOPHAGOGASTRODUODENOSCOPY (EGD) WITH PROPOFOL;  Surgeon: Jerene Bears, MD;  Location: [facility];  Service: Gastroenterology;  Laterality: N/A;  . ESOPHAGOGASTRODUODENOSCOPY (EGD) WITH PROPOFOL N/A 10/29/2017   Procedure: ESOPHAGOGASTRODUODENOSCOPY (EGD) WITH PROPOFOL;  Surgeon: Jerene Bears, MD;  Location: [facility];  Service: Gastroenterology;  Laterality: N/A;  . FOOT NEUROMA SURGERY Left   . ORIF DISTAL RADIUS FRACTURE Right 07/04/2010  . SAVORY DILATION N/A 06/23/2016   Procedure: SAVORY DILATION;  Surgeon: Jerene Bears, MD;  Location: [facility];  Service: Gastroenterology;  Laterality: N/A;  . TENDON REPAIR Right 12/01/2016   Procedure: right long finger ulnar sagittal band reconstruction;  Surgeon: Leanora Cover, MD;  Location: [place];  Service: Orthopedics;  Laterality: Right;  . TONSILLECTOMY      reports that she has never smoked. She has never used smokeless tobacco. She reports that she does not drink alcohol or use drugs. family history includes Audrey Brown; Heart attack in her father; Hypertension in her unknown relative; Lung cancer in her unknown relative. Allergies  Allergen Reactions  . Celebrex [Celecoxib] Hives  . Morphine And Related Hives  . Penicillins Hives, Nausea Only and Other (See Comments)    Has patient had a PCN reaction  causing immediate rash, facial/tongue/throat swelling, SOB or lightheadedness with hypotension: Yes Has patient had a PCN reaction causing severe rash involving mucus membranes or skin necrosis: Yes Has patient had a PCN reaction that required hospitalization: Yes, was already in the hospital  Has patient had a PCN reaction occurring within the last 10 years: No If all of the above answers are "NO", then may proceed with Cephalosporin use.   Marland Kitchen Risperdal [Risperidone] Other (See Comments)    Tremors and seizures  . Statins Other (See Comments)    MUSCLE ACHES      Outpatient Encounter Medications as of 09/20/2018  Medication Sig  . ALPRAZolam (XANAX) 0.25 MG tablet Take 1 tablet (0.25 mg total) by mouth 3 (three) times daily as needed for anxiety.  . AMBULATORY NON FORMULARY MEDICATION Take 10 mg by mouth 3 (three) times daily. Medication Name: Domperidone 10 mg MUST HAVE OFFICE VISIT FOR FURTHER REFILLS  . buPROPion (WELLBUTRIN XL) 300 MG 24 hr tablet Take 300 mg by mouth daily.   . busPIRone (BUSPAR) 10 MG tablet Take 20 mg by mouth 3 (three) times daily.   . Calcium 500 MG CHEW Chew 500 mg by mouth at bedtime.   Marland Kitchen CARAFATE 1 GM/10ML suspension Take 1 g by mouth 3 (three) times daily before meals.  . cholecalciferol (VITAMIN D) 1000 units tablet Take 1,000 Units by mouth daily.  . cyclobenzaprine (FLEXERIL) 10 MG tablet Take 1 tablet (10 mg total) by mouth 3 (three) times daily as needed for muscle spasms.  Marland Kitchen esomeprazole (NEXIUM) 40 MG capsule TAKE 1 CAPSULE TWICE A DAY BEFORE MEALS  . Eszopiclone 3 MG TABS Take 1 tablet (3 mg total) by mouth at bedtime. Take immediately before bedtime  . ibuprofen (ADVIL,MOTRIN) 800 MG tablet TAKE 1 TABLET EVERY 8 HOURS AS NEEDED FOR PAIN  . LamoTRIgine XR (LAMICTAL XR) 200 MG TB24 Take 1 tablet (200 mg total) by mouth daily.  . Magnesium 500 MG CAPS Take 500 mg by mouth at bedtime.   . Melatonin 5 MG CAPS Take 5 mg by mouth at bedtime.  . OSCIMIN  0.125 MG SUBL PLACE 1 TABLET UNDER THE TONGUE EVERY 6 HOURS AS NEEDED FOR CRAMPING (Patient taking differently: PLACE 0.125 MG UNDER THE TONGUE EVERY 6 HOURS AS NEEDED FOR CRAMPING)  . oxyCODONE-acetaminophen (PERCOCET) 10-325 MG tablet Take 1 tablet by mouth every 4 (four) hours as needed for pain.  . promethazine (PHENERGAN) 25 MG tablet Take 1 tablet (25 mg total) by mouth every 4 (four) hours as needed for nausea.  . traMADol (ULTRAM) 50 MG tablet TAKE 2 TABLETS BY MOUTH EVERY 8 HOURS AS NEEDED FOR MODERATE PAIN   No facility-administered encounter medications on file as of 09/20/2018.      REVIEW OF SYSTEMS  : All other systems reviewed and negative except where noted in the History of Present Illness.   PHYSICAL EXAM: BP 120/74 (BP Location: Left Arm, Patient Position: Sitting, Cuff Size:  Normal)   Pulse 76  General: Well developed white female in no acute distress; in wheelchair Head: Normocephalic and atraumatic Eyes:  Sclerae anicteric, conjunctiva pink. Ears: Normal auditory acuity Lungs: Clear throughout to auscultation; no increased WOB. Heart: Regular rate and rhythm; no M/R/G. Abdomen: Soft, non-distended.  BS present.  Mild diffuse TTP. Musculoskeletal: Symmetrical with no gross deformities  Skin: No lesions on visible extremities Extremities: No edema  Neurological: Alert oriented x 4, grossly non-focal Psychological:  Alert and cooperative. Normal mood and affect  ASSESSMENT AND PLAN: *Dysphagia: EGD normal in December 2018 but was empirically dilated.  Reports improvement in her dysphasia following that dilation.  The dysphasia that she describes now sounds more oral pharyngeal.  She has had 2 modified barium swallow studies over the past 2 years, the first showing mild oral, mild pharyngeal, and mild cervical dysphasia and the one just last year showed improvement in those issues.  She is actually seeing neurology in the next month or so for possibly worsening of her  Parkinson's symptoms.  We will plan for EGD with Dr. Hilarie Fredrickson with empiric dilation again to see if she once again gets improvement in her symptoms.  If not, then she likely would benefit from repeat modified barium swallow study.  We will need to discuss with him as far scheduling.  Her last 2 EGDs were performed at [facility], but I am not sure of the reasoning behind doing in the hospital versus the [place].  If they truly need to be done at the hospital then need to determine a time that we can fit her in. *Constipation: Tried Maalox, Alka-Seltzer, and takes her senna regularly.  Has taken a couple doses of MiraLAX over the past couple of days without good results.  I have instructed her to get 2 bottles of magnesium citrate.  She should drink 1 bottle and if no results in our drink the second.  That should clean her out well and then she should begin taking MiraLAX twice daily the following day to try to keep her bowels moving well.   CC:  Laurey Morale, MD  Addendum: Reviewed and agree with assessment and management plan. Would agree with consideration of repeat speech and swallow evaluation and also wait until she sees her neurologist If empiric esophageal dilation has improved symptoms in the past, as she reports, we can consider repeating this once she sees neurology Can have Osvaldo Angst, CRNA review records as she may qualify for endoscopy in the [facility] Pyrtle, Lajuan Lines, MD

## 2018-09-20 NOTE — Telephone Encounter (Signed)
Pt reports she has been having issues with abdominal discomfort, states she feels like there is rock in her stomach. Reports she is constipated and has not had a BM since last week, she is taking Miralax 2 doses daily but has not passed anything. Pt requesting to be seen asap. Offered pt appt with Dr. Hilarie Fredrickson tomorrow but she states she cannot come tomorrow. Pt scheduled to see Alonza Bogus PA today at 2:30pm. Pt aware of appt.

## 2018-09-20 NOTE — Patient Instructions (Signed)
Take 1 dose of Miralax 17 grams in 8 oz of water twice daily.  Get 2 bottles of Magnesium Citrate. Drink one bottle, if no results, then drink the 2nd bottle.   We will be calling you with a date for the Endoscopy with Dr. Zenovia Jarred. [name] needs to consult with him on an available date for this procedure.  [name] will call you regarding the instructions.  Normal BMI (Body Mass Index- based on height and weight) is between 23 and 30. Your BMI today is There is no height or weight on file to calculate BMI. Marland Kitchen Please consider follow up  regarding your BMI with your Primary Care Provider.

## 2018-09-21 ENCOUNTER — Ambulatory Visit
Admission: RE | Admit: 2018-09-21 | Discharge: 2018-09-21 | Disposition: A | Discharge status: Home / Self Care | Payer: Medicare Other | Source: Ambulatory Visit | Attending: Family Medicine | Admitting: Family Medicine

## 2018-09-21 ENCOUNTER — Encounter: Payer: Self-pay | Admitting: Gastroenterology

## 2018-09-21 DIAGNOSIS — G44019 Episodic cluster headache, not intractable: Secondary | ICD-10-CM

## 2018-09-21 DIAGNOSIS — K5909 Other constipation: Secondary | ICD-10-CM | POA: Insufficient documentation

## 2018-09-21 MED ORDER — GADOBENATE DIMEGLUMINE 529 MG/ML IV SOLN
18.0000 mL | Freq: Once | INTRAVENOUS | Status: AC | PRN
  Administered 2018-09-21: 18 mL via INTRAVENOUS

## 2018-09-22 ENCOUNTER — Telehealth: Payer: Self-pay | Admitting: *Deleted

## 2018-09-22 ENCOUNTER — Other Ambulatory Visit: Payer: Self-pay | Admitting: *Deleted

## 2018-09-22 DIAGNOSIS — R1310 Dysphagia, unspecified: Secondary | ICD-10-CM

## 2018-09-22 DIAGNOSIS — K5900 Constipation, unspecified: Secondary | ICD-10-CM

## 2018-09-22 NOTE — Progress Notes (Signed)
amb ref n

## 2018-09-22 NOTE — Telephone Encounter (Signed)
Called the patient to advise, Dr. Hilarie Fredrickson wants to hold off for the EGD and at this time schedule a Modified Barium Swallow with Speech pathology.  I scheduled it for Thurs 09-29-2018 at 1:00 . She is to arrive at 12:45 PM.  The patient verbalized understanding the instructions.

## 2018-09-22 NOTE — Telephone Encounter (Signed)
Put in Ambulatory referral to [facility] on 09-22-2018/.

## 2018-09-23 ENCOUNTER — Other Ambulatory Visit (HOSPITAL_COMMUNITY): Payer: Self-pay | Admitting: *Deleted

## 2018-09-23 DIAGNOSIS — R1310 Dysphagia, unspecified: Secondary | ICD-10-CM

## 2018-09-28 ENCOUNTER — Encounter: Payer: Medicare Other | Admitting: Internal Medicine

## 2018-09-28 NOTE — Telephone Encounter (Signed)
Patient calling to check on the status of medication refill that was due to be filled by 09/23/2018. Patient would like a call back asap to figure out why this request is delayed.

## 2018-09-28 NOTE — Telephone Encounter (Signed)
Routing to PCP CMA for refills! 

## 2018-09-29 ENCOUNTER — Ambulatory Visit (HOSPITAL_COMMUNITY)
Admission: RE | Admit: 2018-09-29 | Discharge: 2018-09-29 | Disposition: A | Discharge status: Home / Self Care | Payer: Medicare Other | Source: Ambulatory Visit | Attending: Gastroenterology | Admitting: Gastroenterology

## 2018-09-29 DIAGNOSIS — G20 Parkinson's disease: Secondary | ICD-10-CM | POA: Diagnosis not present

## 2018-09-29 DIAGNOSIS — R1310 Dysphagia, unspecified: Secondary | ICD-10-CM

## 2018-09-29 NOTE — Telephone Encounter (Signed)
Dr. Fry please advise of refill. Thanks 

## 2018-09-30 ENCOUNTER — Telehealth: Payer: Self-pay | Admitting: *Deleted

## 2018-09-30 NOTE — Telephone Encounter (Signed)
Call in #[phone] with 5 rf 

## 2018-09-30 NOTE — Telephone Encounter (Signed)
The patient does have an appointment with [facility]. The appointment is 11-08-2018 at 2:50 PM.

## 2018-09-30 NOTE — Telephone Encounter (Signed)
Please call.

## 2018-10-12 ENCOUNTER — Telehealth: Payer: Self-pay

## 2018-10-12 NOTE — Telephone Encounter (Signed)
Dr Hilarie Fredrickson you have no available dates for January at the hospital.  11/14/18 you have 4 pt's already scheduled.  We do not have the schedule out any further than that.  Please advise.

## 2018-10-12 NOTE — Telephone Encounter (Signed)
Can go ahead and add to my hospital schedule after 11/09/18 (next available hospital block); at which point she will have already seen neuro.  She has neuro appt scheduled for 11/08/18  Needs EGD with possible dilation at [facility] as per his instructions listed.

## 2018-10-12 NOTE — Telephone Encounter (Signed)
See phone note

## 2018-10-12 NOTE — Telephone Encounter (Signed)
[facility] for Jan 29 or 30th at [facility] with MAC (am preferred) This is a hospital week for me

## 2018-10-13 ENCOUNTER — Other Ambulatory Visit: Payer: Self-pay

## 2018-10-13 ENCOUNTER — Ambulatory Visit (INDEPENDENT_AMBULATORY_CARE_PROVIDER_SITE_OTHER): Payer: Medicare Other | Admitting: Family Medicine

## 2018-10-13 ENCOUNTER — Encounter: Payer: Self-pay | Admitting: Family Medicine

## 2018-10-13 ENCOUNTER — Telehealth: Payer: Self-pay | Admitting: Internal Medicine

## 2018-10-13 VITALS — BP 126/84 | HR 83 | Temp 98.2°F

## 2018-10-13 DIAGNOSIS — K047 Periapical abscess without sinus: Secondary | ICD-10-CM | POA: Diagnosis not present

## 2018-10-13 DIAGNOSIS — R6884 Jaw pain: Secondary | ICD-10-CM

## 2018-10-13 DIAGNOSIS — K0889 Other specified disorders of teeth and supporting structures: Secondary | ICD-10-CM

## 2018-10-13 DIAGNOSIS — R1310 Dysphagia, unspecified: Secondary | ICD-10-CM

## 2018-10-13 MED ORDER — OXYCODONE-ACETAMINOPHEN 10-325 MG PO TABS: 1.0000 | ORAL_TABLET | ORAL | 0 refills | Status: DC | PRN

## 2018-10-13 MED ORDER — ESZOPICLONE 3 MG PO TABS: 3.0000 mg | ORAL_TABLET | Freq: Every day | ORAL | 1 refills | Status: DC

## 2018-10-13 MED ORDER — CLINDAMYCIN HCL 300 MG PO CAPS: 300.0000 mg | ORAL_CAPSULE | Freq: Three times a day (TID) | ORAL | 0 refills | Status: DC

## 2018-10-13 NOTE — Progress Notes (Signed)
   Subjective:    Patient ID: Audrey Brown, female    DOB: August 09, 1946, 72 y.o.   MRN: [id number]  HPI Here for pain and swelling in the left jaw. 2 days ago while eating a raw carrot she felt a crack in the left lower jaw, and she thinks part of a filling came out. She has been applying ice and taking Percocet since then. She is scheduled to see her dentist at the [facility] clinic tomorrow.   Review of Systems  Constitutional: Negative.   HENT: Positive for dental problem and facial swelling.   Eyes: Negative.   Respiratory: Negative.        Objective:   Physical Exam  Constitutional: She appears well-developed and well-nourished.  HENT:  Right Ear: External ear normal.  Left Ear: External ear normal.  Nose: Nose normal.  A lower left molar has a filling in place but a piece of tooth appears to be broken off   Eyes: Conjunctivae are normal.  Neck: No thyromegaly present.  Tender swollen nodes under the left mandible   Pulmonary/Chest: Effort normal and breath sounds normal.          Assessment & Plan:  Broken tooth, cover for infection with Clindamycin. Use Percocet for pain. See the dentist tomorrow.  Alysia Penna, MD

## 2018-10-13 NOTE — Telephone Encounter (Signed)
EGD scheduled, pt instructed and medications reviewed.  Patient instructions mailed to home.  Patient to call with any questions or concerns.  

## 2018-10-13 NOTE — Telephone Encounter (Signed)
Pt states she cannot make the appt on 12/08/18@ [facility], states she has another appt that day that she cannot change. Dr. Hilarie Fredrickson aware and let pt know we will have to call her back when next available hospital appt is available. Pt verbalized understanding.

## 2018-10-13 NOTE — Telephone Encounter (Signed)
Pt needs to r/s procedure on 12/08/17 at [facility] hospital.

## 2018-10-13 NOTE — Telephone Encounter (Signed)
12/07/18 EGD [facility]

## 2018-10-13 NOTE — Telephone Encounter (Signed)
Error appt change to 12/08/18 930 am [facility] EGD DIL

## 2018-11-07 NOTE — Progress Notes (Signed)
NEUROLOGY CONSULTATION NOTE  Audrey Brown MRN: [id number] DOB: 01-19-1946  Referring provider: Alysia Penna, MD, Zenovia Jarred, MD Primary care provider: Alysia Penna, MD  Reason for consult:  headaches  HISTORY OF PRESENT ILLNESS: Audrey Brown is a 72 year old  female who presents for evaluation of headache and dysphagia.  History supplemented by referring providers' notes.  HEADACHES: About 6 months ago, she began experiencing new headaches.  She describes a sudden onset of severe stabbing pain on right side of head and behind right eye.  It is associated with blurred vision and tearing of the right eye and nausea but denies photophobia, phonophobia, osmophobia, vomiting or unilateral numbness or weakness.  They typically occur early evening (5-6 PM) and occur every 2 weeks for 3 consecutive days.  She will typically take a pain reliever and go to sleep.  When she wakes up 2 hours later, it has resolved.   She first takes ibuprofen and then will take tramadol an hour later.  Oxycodone is last resort.  There are no particular triggers.    MRI of the brain with and without contrast from 09/21/2018 was personally reviewed and demonstrated chronic white matter changes but nothing acute or concerning.  Current NSAIDS:  ibuprofen Current analgesics:  Percocet, tramadol Current triptans:  none Current ergotamine:  none Current anti-emetic: Phenergan Current muscle relaxants: Flexeril Current anti-anxiolytic:  alprazolam, BuSpar Current sleep aide:  none Current Antihypertensive medications:  none Current Antidepressant medications: Wellbutrin XL 300 mg daily Current Anticonvulsant medications: Lamictal XR 200 mg daily Current anti-CGRP:  none Current Vitamins/Herbal/Supplements:  none Current Antihistamines/Decongestants:  none Other therapy:  none  Caffeine:  3 to 4 cups of coffee daily Alcohol:  rarely Diet:  Drinks 24 oz water daily Depression:  yes; Anxiety:  yes Sleep  hygiene:  Poor.  3 to 4 hours a night. Family history of headache:  No  She reports no significant history of migraines or other headaches.  DYSPHAGIA: She has had dysphagia since 2012.  She underwent esophageal dilation.  It helps but symptoms return.  Over the past 2 years, she has had 2 modified barium swallow studies.  The study in 2017 showed mild oral, mild pharyngeal and mild cervical dysphagia.  Repeat study from 2018 fell within normal limits but appeared to have mildly prominent cricopharyngeus muscle resulting in minimal and intermittent backflow from the cervical esophagus as also observed during MBS in 2017.    EGD in December 2018 was normal except for a 4 cm hiatal hernia.  She underwent empirical dilation of her esophagus with subsequent improvement.  However, she began experiencing worsening dysphagia.  She reports that food gets caught in the back of her throat.  She had a repeat modified barium swallow on 09/29/2018 which demonstrated functional oropharyngeal swallow and cervical esophageal impairment in which she did not fully chew her cracker but no actual pharyngeal muscle weakness or aspiration.  She has a repeat EGD scheduled next month.  She also has gastroparesis.    In 2012, she had also had fairly sudden onset of other neurologic symptoms, including parkinsonism, tremor, falls, hallucinations, cognitive complaints with confusion and difficulty performing ADLs and stuttering speech.  She was started on Sinemet.  She underwent workup by movement disorder specialist at [facility].  Neurocognitive testing in 2012 was reportedly unremarkable.  Spinal tap, including protein, OCB and paraneoplastic panel, was negative.  DAT scan was unremarkable.  Symptoms uncertain but may possibly be psychogenic and Sinemet was  discontinued.  [facility] may have been exposed to a toxin in the water while serving in [employer].  She continues to use a walker, otherwise a wheelchair.  PAST  MEDICAL HISTORY: Past Medical History:  Diagnosis Date  . Arthritis    lower back  . Bipolar disorder ([place])   . Depression   . GERD (gastroesophageal reflux disease)   . Hiatal hernia   . Parkinson disease ([place])   . Poor short-term memory   . Sagittal band rupture at metacarpophalangeal joint 11/2016   right long finger    PAST SURGICAL HISTORY: Past Surgical History:  Procedure Laterality Date  . ANKLE SURGERY    . BALLOON DILATION N/A 10/29/2017   Procedure: BALLOON DILATION;  Surgeon: Jerene Bears, MD;  Location: [facility];  Service: Gastroenterology;  Laterality: N/A;  . CERVICAL [place]  . CESAREAN SECTION    . CHOLECYSTECTOMY    . CYST REMOVAL NECK    . ESOPHAGOGASTRODUODENOSCOPY (EGD) WITH ESOPHAGEAL DILATION  07/27/2012   with Propofol  . ESOPHAGOGASTRODUODENOSCOPY (EGD) WITH PROPOFOL N/A 06/23/2016   Procedure: ESOPHAGOGASTRODUODENOSCOPY (EGD) WITH PROPOFOL;  Surgeon: Jerene Bears, MD;  [facility];  Service: Gastroenterology;  Laterality: N/A;  . ESOPHAGOGASTRODUODENOSCOPY (EGD) WITH PROPOFOL N/A 10/29/2017   Procedure: ESOPHAGOGASTRODUODENOSCOPY (EGD) WITH PROPOFOL;  Surgeon: Jerene Bears, MD;  [facility];  Service: Gastroenterology;  Laterality: N/A;  . FOOT NEUROMA SURGERY Left   . ORIF DISTAL RADIUS FRACTURE Right 07/04/2010  . SAVORY DILATION N/A 06/23/2016   Procedure: SAVORY DILATION;  Surgeon: Jerene Bears, MD;  [facility];  Service: Gastroenterology;  Laterality: N/A;  . TENDON REPAIR Right 12/01/2016   Procedure: right long finger ulnar sagittal band reconstruction;  Surgeon: Leanora Cover, MD;  Location: [place];  Service: Orthopedics;  Laterality: Right;  . TONSILLECTOMY      MEDICATIONS: Current Outpatient Medications on File Prior to Visit  Medication Sig Dispense Refill  . ALPRAZolam (XANAX) 0.25 MG tablet Take 1 tablet (0.25 mg total) by mouth 3 (three) times daily as needed for  anxiety. 270 tablet 1  . AMBULATORY NON FORMULARY MEDICATION Take 10 mg by mouth 3 (three) times daily. Medication Name: Domperidone 10 mg MUST HAVE OFFICE VISIT FOR FURTHER REFILLS 360 tablet 0  . buPROPion (WELLBUTRIN XL) 300 MG 24 hr tablet Take 300 mg by mouth daily.     . busPIRone (BUSPAR) 10 MG tablet Take 20 mg by mouth 3 (three) times daily.     . Calcium 500 MG CHEW Chew 500 mg by mouth at bedtime.     Marland Kitchen CARAFATE 1 GM/10ML suspension Take 1 g by mouth 3 (three) times daily before meals.  1  . cholecalciferol (VITAMIN D) 1000 units tablet Take 1,000 Units by mouth daily.    . clindamycin (CLEOCIN) 300 MG capsule Take 1 capsule (300 mg total) by mouth 3 (three) times daily. 21 capsule 0  . cyclobenzaprine (FLEXERIL) 10 MG tablet Take 1 tablet (10 mg total) by mouth 3 (three) times daily as needed for muscle spasms. 270 tablet 3  . esomeprazole (NEXIUM) 40 MG capsule TAKE 1 CAPSULE TWICE A DAY BEFORE MEALS 180 capsule 3  . Eszopiclone 3 MG TABS Take 1 tablet (3 mg total) by mouth at bedtime. Take immediately before bedtime 90 tablet 1  . ibuprofen (ADVIL,MOTRIN) 800 MG tablet TAKE 1 TABLET EVERY 8 HOURS AS NEEDED FOR PAIN 270 tablet 2  .  LamoTRIgine XR (LAMICTAL XR) 200 MG TB24 Take 1 tablet (200 mg total) by mouth daily. 90 tablet 1  . Magnesium 500 MG CAPS Take 500 mg by mouth at bedtime.     . Melatonin 5 MG CAPS Take 5 mg by mouth at bedtime.    . OSCIMIN 0.125 MG SUBL PLACE 1 TABLET UNDER THE TONGUE EVERY 6 HOURS AS NEEDED FOR CRAMPING (Patient taking differently: PLACE 0.125 MG UNDER THE TONGUE EVERY 6 HOURS AS NEEDED FOR CRAMPING) 360 each 0  . oxyCODONE-acetaminophen (PERCOCET) 10-325 MG tablet Take 1 tablet by mouth every 4 (four) hours as needed for pain. 30 tablet 0  . promethazine (PHENERGAN) 25 MG tablet Take 1 tablet (25 mg total) by mouth every 4 (four) hours as needed for nausea. 180 tablet 2  . traMADol (ULTRAM) 50 MG tablet TAKE 2 TABLETS BY MOUTH EVERY 8 HOURS AS NEEDED  FOR MODERATE PAIN 60 tablet 5   No current facility-administered medications on file prior to visit.     ALLERGIES: Allergies  Allergen Reactions  . Celebrex [Celecoxib] Hives  . Morphine And Related Hives  . Penicillins Hives, Nausea Only and Other (See Comments)    Has patient had a PCN reaction causing immediate rash, facial/tongue/throat swelling, SOB or lightheadedness with hypotension: Yes Has patient had a PCN reaction causing severe rash involving mucus membranes or skin necrosis: Yes Has patient had a PCN reaction that required hospitalization: Yes, was already in the hospital  Has patient had a PCN reaction occurring within the last 10 years: No If all of the above answers are "NO", then may proceed with Cephalosporin use.   Marland Kitchen Risperdal [Risperidone] Other (See Comments)    Tremors and seizures  . Statins Other (See Comments)    MUSCLE ACHES    FAMILY HISTORY: Family History  Problem Relation Age of Onset  . Emphysema Mother   . Heart attack Father   . Hypertension Unknown   . Lung cancer Unknown     SOCIAL HISTORY: Social History   Socioeconomic History  . Marital status: Widowed    Spouse name: Not on file  . Number of children: 4  . Years of education: Not on file  . Highest education level: Not on file  Occupational History  . Occupation: Retired    Fish farm manager: RETIRED  Social Needs  . Financial resource strain: Not on file  . Food insecurity:    Worry: Not on file    Inability: Not on file  . Transportation needs:    Medical: Not on file    Non-medical: Not on file  Tobacco Use  . Smoking status: Never Smoker  . Smokeless tobacco: Never Used  Substance and Sexual Activity  . Alcohol use: No    Alcohol/week: 0.0 standard drinks  . Drug use: No  . Sexual activity: Not on file  Lifestyle  . Physical activity:    Days per week: Not on file    Minutes per session: Not on file  . Stress: Not on file  Relationships  . Social connections:     Talks on phone: Not on file    Gets together: Not on file    Attends religious service: Not on file    Active member of club or organization: Not on file    Attends meetings of clubs or organizations: Not on file    Relationship status: Not on file  . Intimate partner violence:    Fear of current or ex partner: Not  on file    Emotionally abused: Not on file    Physically abused: Not on file    Forced sexual activity: Not on file  Other Topics Concern  . Not on file  Social History Narrative   Daughter is her POA and must come to all visits in GI and [facility]     REVIEW OF SYSTEMS: Constitutional: No fevers, chills, or sweats, no generalized fatigue, change in appetite Eyes: No visual changes, double vision, eye pain Ear, nose and throat: No hearing loss, ear pain, nasal congestion, sore throat Cardiovascular: No chest pain, palpitations Respiratory:  No shortness of breath at rest or with exertion, wheezes GastrointestinaI: No nausea, vomiting, diarrhea, abdominal pain, fecal incontinence Genitourinary:  No dysuria, urinary retention or frequency Musculoskeletal:  No neck pain, back pain Integumentary: No rash, pruritus, skin lesions Neurological: as above Psychiatric: No depression, insomnia, anxiety Endocrine: No palpitations, fatigue, diaphoresis, mood swings, change in appetite, change in weight, increased thirst Hematologic/Lymphatic:  No purpura, petechiae. Allergic/Immunologic: no itchy/runny eyes, nasal congestion, recent allergic reactions, rashes  PHYSICAL EXAM: Blood pressure (!) 138/92, pulse 78, height 5\' 2"  (1.575 m), weight 169 lb (76.7 kg), SpO2 99 %. General: No acute distress.  Patient appears well-groomed.  Head:  Normocephalic/atraumatic Eyes:  fundi examined but not visualized Neck: supple, no paraspinal tenderness, full range of motion Back: No paraspinal tenderness Heart: regular rate and rhythm Lungs: Clear to auscultation bilaterally. Vascular: No carotid  bruits. Neurological Exam: Mental status: alert and oriented to person, place, and time, recent and remote memory intact, fund of knowledge intact, attention and concentration intact, speech fluent and not dysarthric, language intact. Cranial nerves: CN I: not tested CN II: pupils equal, round and reactive to light, visual fields intact CN III, IV, VI:  full range of motion, no nystagmus, no ptosis CN V: facial sensation intact CN VII: upper and lower face symmetric CN VIII: hearing intact CN IX, X: gag intact, uvula midline CN XI: sternocleidomastoid and trapezius muscles intact CN XII: tongue midline Bulk & Tone: normal, no fasciculations or rigidity. Motor:  5/5 throughout.  No bradykinesia Sensation: temperature sensation reduced in feet and vibration sensation intact. . Deep Tendon Reflexes:  2+ throughout, toes downgoing.  Finger to nose testing:  Tremor but without dysmetria.  Gait:  In wheelchair  IMPRESSION: 1.  Cluster headache, not intractable. 2.  Dysphagia.  There does not appear to be any pharyngeal muscle weakness on MBS.  Likely functional.  She does not appear to have Parkinson's disease.  Rather, she exhibits a postural tremor in the extremities but without bradykinesia or rigidity.  She has been evaluated at the [facility] Disorder Clinic at [facility] where DAT scan was negative, highly suggestive that she does not have Parkinson's disease.  PLAN: 1.  For headache prevention, we will start verapamil 80mg  three times daily 2.  She may continue current headache abortive regimen:  First line ibuprofen, second line tramadol 3.  Limit use of pain relievers to no more than 2 days out of week to prevent risk of rebound or medication-overuse headache. 4.  We will refer for neuropsychological testing 5.  To better try to establish a diagnosis, we will refer to the Movement Disorder Clinic at [facility] for another opinion. 6.  Follow up in 3 to 4 months.  Thank you for allowing me  to take part in the care of this patient.  Metta Clines, DO  CC:  Alysia Penna, MD  Zenovia Jarred, MD

## 2018-11-08 ENCOUNTER — Ambulatory Visit (INDEPENDENT_AMBULATORY_CARE_PROVIDER_SITE_OTHER): Payer: Medicare Other | Admitting: Neurology

## 2018-11-08 ENCOUNTER — Encounter: Payer: Self-pay | Admitting: Neurology

## 2018-11-08 VITALS — BP 138/92 | HR 78 | Ht 62.0 in | Wt 169.0 lb

## 2018-11-08 DIAGNOSIS — G44019 Episodic cluster headache, not intractable: Secondary | ICD-10-CM | POA: Diagnosis not present

## 2018-11-08 DIAGNOSIS — R251 Tremor, unspecified: Secondary | ICD-10-CM

## 2018-11-08 DIAGNOSIS — R4189 Other symptoms and signs involving cognitive functions and awareness: Secondary | ICD-10-CM

## 2018-11-08 MED ORDER — VERAPAMIL HCL 80 MG PO TABS: 80.0000 mg | ORAL_TABLET | Freq: Three times a day (TID) | ORAL | 1 refills | Status: DC

## 2018-11-08 NOTE — Patient Instructions (Signed)
1.  For headache, start verapamil 80mg  three times daily 2.  When you get a headache, take ibuprofen first line and then tramadol 3.  We will refer you to [facility] at the Movement Disorder Clinic 4.  We will refer you to [facility] in [place] for neurocognitive testing. 5.  Follow up in 3 to 4 months

## 2018-11-11 ENCOUNTER — Ambulatory Visit (INDEPENDENT_AMBULATORY_CARE_PROVIDER_SITE_OTHER): Payer: Medicare Other | Admitting: Family Medicine

## 2018-11-11 ENCOUNTER — Encounter: Payer: Self-pay | Admitting: Family Medicine

## 2018-11-11 VITALS — BP 126/88 | HR 78 | Temp 98.4°F

## 2018-11-11 DIAGNOSIS — G8929 Other chronic pain: Secondary | ICD-10-CM

## 2018-11-11 DIAGNOSIS — M25561 Pain in right knee: Secondary | ICD-10-CM | POA: Diagnosis not present

## 2018-11-11 NOTE — Progress Notes (Signed)
   Subjective:    Patient ID: Audrey Brown, female    DOB: 1946/07/23, 73 y.o.   MRN: [id number]  HPI Here for frequent giving way and pain in the right knee. No hx of trauma, but this started about 18 months ago. She will be walking with her walker at home when she suddenly feels a severe pain in the lateral right knee and the leg buckles beneath her. She is usually able to catch herself before hitting the floor, but she has fallen a few times. The pain will last about a day or two and then go away.    Review of Systems  Constitutional: Negative.   Respiratory: Negative.   Cardiovascular: Negative.   Musculoskeletal: Positive for arthralgias and gait problem.       Objective:   Physical Exam Constitutional:      Appearance: Normal appearance.     Comments: In a wheelchair   Cardiovascular:     Rate and Rhythm: Normal rate and regular rhythm.     Pulses: Normal pulses.     Heart sounds: Normal heart sounds.  Pulmonary:     Effort: Pulmonary effort is normal.     Breath sounds: Normal breath sounds.  Musculoskeletal:     Comments: Right knee is not swollen or warm or red. ROM is full. There is slight crepitus. She is tender over the lateral knee but not over the joint space   Neurological:     Mental Status: She is alert.           Assessment & Plan:  Chronic pain and instability of the right knee, possibly due to a degenerative meniscus or to patellar subluxation. She will wear an elastic support sleeve. Refer to Orthopedics to evaluate.  Alysia Penna, MD

## 2018-11-14 ENCOUNTER — Other Ambulatory Visit: Payer: Self-pay

## 2018-11-14 DIAGNOSIS — R1310 Dysphagia, unspecified: Secondary | ICD-10-CM

## 2018-11-15 ENCOUNTER — Telehealth: Payer: Self-pay

## 2018-11-15 NOTE — Telephone Encounter (Signed)
Pt scheduled with Dr Hilarie Fredrickson see 12/5 note

## 2018-11-15 NOTE — Telephone Encounter (Signed)
-----   Message from Timothy Lasso, RN sent at 10/12/2018  4:30 PM EST ----- 11/14/18 full need to wait for Feb schedule  Can go ahead and add to my hospital schedule after 11/09/18 (next available hospital block); at which point she will have already seen neuro.  She has neuro appt scheduled for 11/08/18  Needs EGD with possible dilation at [facility] as per his instructions listed.

## 2018-11-22 DIAGNOSIS — M25561 Pain in right knee: Secondary | ICD-10-CM | POA: Insufficient documentation

## 2018-11-23 DIAGNOSIS — M25561 Pain in right knee: Secondary | ICD-10-CM | POA: Diagnosis not present

## 2018-11-23 DIAGNOSIS — M238X1 Other internal derangements of right knee: Secondary | ICD-10-CM | POA: Diagnosis not present

## 2018-11-24 DIAGNOSIS — R413 Other amnesia: Secondary | ICD-10-CM | POA: Diagnosis not present

## 2018-11-29 DIAGNOSIS — R413 Other amnesia: Secondary | ICD-10-CM | POA: Diagnosis not present

## 2018-12-13 DIAGNOSIS — F447 Conversion disorder with mixed symptom presentation: Secondary | ICD-10-CM | POA: Diagnosis not present

## 2018-12-14 ENCOUNTER — Telehealth: Payer: Self-pay | Admitting: Internal Medicine

## 2018-12-19 ENCOUNTER — Encounter (HOSPITAL_COMMUNITY)
Admission: RE | Disposition: A | Discharge status: Home / Self Care | Payer: Self-pay | Source: Home / Self Care | Attending: Internal Medicine

## 2018-12-19 ENCOUNTER — Ambulatory Visit (HOSPITAL_COMMUNITY): Payer: Medicare Other | Admitting: Anesthesiology

## 2018-12-19 ENCOUNTER — Ambulatory Visit (HOSPITAL_COMMUNITY)
Admission: RE | Admit: 2018-12-19 | Discharge: 2018-12-19 | Disposition: A | Discharge status: Home / Self Care | Payer: Medicare Other | Attending: Internal Medicine | Admitting: Internal Medicine

## 2018-12-19 ENCOUNTER — Other Ambulatory Visit: Payer: Self-pay

## 2018-12-19 ENCOUNTER — Encounter (HOSPITAL_COMMUNITY): Payer: Self-pay | Admitting: Anesthesiology

## 2018-12-19 DIAGNOSIS — M1990 Unspecified osteoarthritis, unspecified site: Secondary | ICD-10-CM | POA: Insufficient documentation

## 2018-12-19 DIAGNOSIS — K219 Gastro-esophageal reflux disease without esophagitis: Secondary | ICD-10-CM | POA: Diagnosis not present

## 2018-12-19 DIAGNOSIS — G20 Parkinson's disease: Secondary | ICD-10-CM | POA: Diagnosis not present

## 2018-12-19 DIAGNOSIS — F319 Bipolar disorder, unspecified: Secondary | ICD-10-CM | POA: Insufficient documentation

## 2018-12-19 DIAGNOSIS — R1310 Dysphagia, unspecified: Secondary | ICD-10-CM | POA: Insufficient documentation

## 2018-12-19 DIAGNOSIS — K449 Diaphragmatic hernia without obstruction or gangrene: Secondary | ICD-10-CM | POA: Diagnosis not present

## 2018-12-19 DIAGNOSIS — Z888 Allergy status to other drugs, medicaments and biological substances status: Secondary | ICD-10-CM | POA: Diagnosis not present

## 2018-12-19 DIAGNOSIS — Z79899 Other long term (current) drug therapy: Secondary | ICD-10-CM | POA: Insufficient documentation

## 2018-12-19 DIAGNOSIS — Z886 Allergy status to analgesic agent status: Secondary | ICD-10-CM | POA: Diagnosis not present

## 2018-12-19 DIAGNOSIS — Z885 Allergy status to narcotic agent status: Secondary | ICD-10-CM | POA: Insufficient documentation

## 2018-12-19 HISTORY — PX: ESOPHAGOGASTRODUODENOSCOPY (EGD) WITH PROPOFOL: SHX5813

## 2018-12-19 HISTORY — PX: ESOPHAGEAL DILATION: SHX303

## 2018-12-19 SURGERY — ESOPHAGOGASTRODUODENOSCOPY (EGD) WITH PROPOFOL
Anesthesia: Monitor Anesthesia Care

## 2018-12-19 MED ORDER — PROPOFOL 10 MG/ML IV BOLUS
INTRAVENOUS | Status: DC | PRN
  Administered 2018-12-19: 10 mg via INTRAVENOUS
  Administered 2018-12-19: 30 mg via INTRAVENOUS
  Administered 2018-12-19: 10 mg via INTRAVENOUS
  Administered 2018-12-19: 20 mg via INTRAVENOUS
  Administered 2018-12-19: 10 mg via INTRAVENOUS
  Administered 2018-12-19 (×2): 30 mg via INTRAVENOUS
  Administered 2018-12-19 (×2): 10 mg via INTRAVENOUS
  Administered 2018-12-19: 20 mg via INTRAVENOUS
  Administered 2018-12-19: 10 mg via INTRAVENOUS

## 2018-12-19 MED ORDER — ONDANSETRON HCL 4 MG/2ML IJ SOLN: 4.0000 mg | Freq: Once | INTRAMUSCULAR | Status: DC | PRN

## 2018-12-19 MED ORDER — LIDOCAINE HCL 1 % IJ SOLN
INTRAMUSCULAR | Status: DC | PRN
  Administered 2018-12-19: 10 mg via INTRADERMAL
  Administered 2018-12-19: 20 mg via INTRADERMAL
  Administered 2018-12-19: 40 mg via INTRADERMAL

## 2018-12-19 MED ORDER — SODIUM CHLORIDE 0.9 % IV SOLN: INTRAVENOUS | Status: DC

## 2018-12-19 MED ORDER — PROPOFOL 10 MG/ML IV BOLUS
INTRAVENOUS | Status: AC
  Filled 2018-12-19: qty 40

## 2018-12-19 MED ORDER — LACTATED RINGERS IV SOLN
INTRAVENOUS | Status: DC | PRN
  Administered 2018-12-19: 11:00:00 via INTRAVENOUS

## 2018-12-19 SURGICAL SUPPLY — 14 items

## 2018-12-19 NOTE — Op Note (Signed)
[facility] Patient Name: Audrey Brown Procedure Date: 12/19/2018 MRN: [id number] Attending MD: Jerene Bears , MD Date of Birth: 10/15/1946 CSN: [id number] Age: 73 Admit Type: Outpatient Procedure:                Upper GI endoscopy Indications:              Dysphagia, previous improvement with esophageal                            dilation, last dilation Nov 2018 Providers:                Lajuan Lines. Hilarie Fredrickson, MD, Zenon Mayo, RN, Cherylynn Ridges,                            Technician, Karis Juba, CRNA Referring MD:             Ishmael Holter. Sarajane Jews, MD Medicines:                Monitored Anesthesia Care Complications:            No immediate complications. Estimated Blood Loss:     Estimated blood loss: none. Procedure:                Pre-Anesthesia Assessment:                           - Prior to the procedure, a History and Physical                            was performed, and patient medications and                            allergies were reviewed. The patient's tolerance of                            previous anesthesia was also reviewed. The risks                            and benefits of the procedure and the sedation                            options and risks were discussed with the patient.                            All questions were answered, and informed consent                            was obtained. Prior Anticoagulants: The patient has                            taken no previous anticoagulant or antiplatelet                            agents. ASA Grade Assessment: III - A patient with  severe systemic disease. After reviewing the risks                            and benefits, the patient was deemed in                            satisfactory condition to undergo the procedure.                           After obtaining informed consent, the endoscope was                            passed under direct vision. Throughout the                         procedure, the patient's blood pressure, pulse, and                            oxygen saturations were monitored continuously. The                            GIF-H190 ([id number] Olympus gastroscope was                            introduced through the mouth, and advanced to the                            second part of duodenum. The upper GI endoscopy was                            accomplished without difficulty. The patient                            tolerated the procedure well. Scope In: Scope Out: Findings:      Normal mucosa was found in the entire esophagus.      A 2 cm hiatal hernia was present.      No endoscopic abnormality was evident in the esophagus to explain the       patient's complaint of dysphagia. It was decided, however, to proceed       with dilation of the entire esophagus. A TTS dilator was passed through       the scope. Dilation with a 15-16.5-18 mm balloon dilator was performed       to 18 mm. After dilation in lower esophagus and across the GE junction,       the inflated balloon was gently pulled into the proximal esophagus (as       before).      The entire examined stomach was normal.      The examined duodenum was normal. Impression:               - Normal mucosa was found in the entire esophagus.                           - 2 cm hiatal hernia.                           -  Esophageal dilation to 18 mm.                           - Normal stomach.                           - Normal examined duodenum.                           - No specimens collected. Moderate Sedation:      N/A Recommendation:           - Patient has a contact number available for                            emergencies. The signs and symptoms of potential                            delayed complications were discussed with the                            patient. Return to normal activities tomorrow.                            Written discharge instructions were  provided to the                            patient.                           - Advance diet as tolerated.                           - Continue present medications.                           - Repeat upper endoscopy as needed for retreatment. Procedure Code(s):        --- Professional ---                           [phone], Esophagogastroduodenoscopy, flexible,                            transoral; with transendoscopic balloon dilation of                            esophagus (less than 30 mm diameter) Diagnosis Code(s):        --- Professional ---                           K44.9, Diaphragmatic hernia without obstruction or                            gangrene                           R13.10, Dysphagia, unspecified CPT copyright 2018 American Medical Association. All rights reserved. The codes documented in  this report are preliminary and upon coder review may  be revised to meet current compliance requirements. Jerene Bears, MD 12/19/2018 11:31:18 AM This report has been signed electronically. Number of Addenda: 0

## 2018-12-19 NOTE — Anesthesia Postprocedure Evaluation (Signed)
Anesthesia Post Note  Patient: Audrey Brown  Procedure(s) Performed: ESOPHAGOGASTRODUODENOSCOPY (EGD) WITH PROPOFOL (N/A ) ESOPHAGEAL DILATION     Patient location during evaluation: PACU Anesthesia Type: MAC Level of consciousness: awake and alert Pain management: pain level controlled Vital Signs Assessment: post-procedure vital signs reviewed and stable Respiratory status: spontaneous breathing, nonlabored ventilation and respiratory function stable Cardiovascular status: stable and blood pressure returned to baseline Postop Assessment: no apparent nausea or vomiting Anesthetic complications: no    Last Vitals:  Vitals:   12/19/18 1131 12/19/18 1140  BP: (!) 155/61 (!) 161/77  Pulse: 79 75  Resp: 19 13  Temp: 36.5 C   SpO2: 97% 98%    Last Pain:  Vitals:   12/19/18 1140  TempSrc:   PainSc: 0-No pain                 Lynda Rainwater

## 2018-12-19 NOTE — Transfer of Care (Signed)
Immediate Anesthesia Transfer of Care Note  Patient: AYNSLEE MULHALL  Procedure(s) Performed: ESOPHAGOGASTRODUODENOSCOPY (EGD) WITH PROPOFOL (N/A ) ESOPHAGEAL DILATION  Patient Location: PACU and Endoscopy Unit  Anesthesia Type:MAC  Level of Consciousness: awake, alert  and oriented  Airway & Oxygen Therapy: Patient Spontanous Breathing and Patient connected to nasal cannula oxygen  Post-op Assessment: Report given to RN and Post -op Vital signs reviewed and stable  Post vital signs: Reviewed and stable  Last Vitals:  Vitals Value Taken Time  BP 155/61 12/19/2018 11:30 AM  Temp    Pulse 79 12/19/2018 11:30 AM  Resp 19 12/19/2018 11:30 AM  SpO2 97 % 12/19/2018 11:30 AM  Vitals shown include unvalidated device data.  Last Pain:  Vitals:   12/19/18 1055  TempSrc: Oral  PainSc: 0-No pain         Complications: No apparent anesthesia complications

## 2018-12-19 NOTE — H&P (Signed)
HPI: Audrey Brown is a 73 year old female with Parkinson's disease, GERD with 4 cm hiatal hernia, history of bipolar disorder who presents for outpatient endoscopy.  She was seen in the office on 09/20/2018 by Alonza Bogus, PA-C.  She has had recurrent issues with dysphagia which have been very responsive to prior balloon dilation of the esophagus both in 2017 and December 2018.  She has had recurrent issues with dysphagia both solid and liquid.  She is also having coughing at times with swallowing.  This is reminiscent of prior symptoms which have been previously improved significantly by dilation.  Her last dilation 14 months ago was with 18 mm balloon in the lower esophagus pulled proximally into the esophagus.  Denies issues with heartburn.  No abdominal pain.  Very rare nausea.  No vomiting.  No chest pain or shortness of breath.  Constipation discussed with Alonza Bogus at last office visit has improved.  No rectal bleeding or melena.  Past Medical History:  Diagnosis Date  . Arthritis    lower back  . Bipolar disorder ([place])   . Depression   . GERD (gastroesophageal reflux disease)   . Hiatal hernia   . Parkinson disease ([place])   . Poor short-term memory   . Sagittal band rupture at metacarpophalangeal joint 11/2016   right long finger    Past Surgical History:  Procedure Laterality Date  . ANKLE SURGERY    . BALLOON DILATION N/A 10/29/2017   Procedure: BALLOON DILATION;  Surgeon: Jerene Bears, MD;  Location: [facility];  Service: Gastroenterology;  Laterality: N/A;  . CERVICAL [place]  . CESAREAN SECTION    . CHOLECYSTECTOMY    . CYST REMOVAL NECK    . ESOPHAGOGASTRODUODENOSCOPY (EGD) WITH ESOPHAGEAL DILATION  07/27/2012   with Propofol  . ESOPHAGOGASTRODUODENOSCOPY (EGD) WITH PROPOFOL N/A 06/23/2016   Procedure: ESOPHAGOGASTRODUODENOSCOPY (EGD) WITH PROPOFOL;  Surgeon: Jerene Bears, MD;  Location: [facility];  Service: Gastroenterology;  Laterality: N/A;  .  ESOPHAGOGASTRODUODENOSCOPY (EGD) WITH PROPOFOL N/A 10/29/2017   Procedure: ESOPHAGOGASTRODUODENOSCOPY (EGD) WITH PROPOFOL;  Surgeon: Jerene Bears, MD;  Location: [facility];  Service: Gastroenterology;  Laterality: N/A;  . FOOT NEUROMA SURGERY Left   . ORIF DISTAL RADIUS FRACTURE Right 07/04/2010  . SAVORY DILATION N/A 06/23/2016   Procedure: SAVORY DILATION;  Surgeon: Jerene Bears, MD;  Location: [facility];  Service: Gastroenterology;  Laterality: N/A;  . TENDON REPAIR Right 12/01/2016   Procedure: right long finger ulnar sagittal band reconstruction;  Surgeon: Leanora Cover, MD;  Location: [place];  Service: Orthopedics;  Laterality: Right;  . TONSILLECTOMY      (Not in an outpatient encounter)   Allergies  Allergen Reactions  . Celebrex [Celecoxib] Hives  . Morphine And Related Hives  . Penicillins Hives, Nausea Only and Other (See Comments)    Has patient had a PCN reaction causing immediate rash, facial/tongue/throat swelling, SOB or lightheadedness with hypotension: Yes Has patient had a PCN reaction causing severe rash involving mucus membranes or skin necrosis: Yes Has patient had a PCN reaction that required hospitalization: Yes, was already in the hospital  Has patient had a PCN reaction occurring within the last 10 years: No If all of the above answers are "NO", then may proceed with Cephalosporin use.   Marland Kitchen Risperdal [Risperidone] Other (See Comments)    Tremors and seizures  . Statins Other (See Comments)    MUSCLE ACHES    Family History  Problem Relation Age of  Onset  . Emphysema Mother   . Heart attack Father   . Hypertension Other   . Lung cancer Other     Social History   Tobacco Use  . Smoking status: Never Smoker  . Smokeless tobacco: Never Used  Substance Use Topics  . Alcohol use: No    Alcohol/week: 0.0 standard drinks  . Drug use: No    ROS: As per history of present illness, otherwise negative  BP (!) 165/85   Pulse 77    Temp 97.8 F (36.6 C) (Oral)   Resp 16   Ht 5\' 4"  (1.626 m)   Wt 79.4 kg   SpO2 96%   BMI 30.04 kg/m   Gen: awake, alert, NAD HEENT: anicteric, op clear CV: RRR, no mrg Pulm: CTA b/l Abd: soft, NT/ND, +BS throughout Ext: no c/c/e Neuro: nonfocal   RELEVANT LABS AND IMAGING: CBC    Component Value Date/Time   WBC 6.9 04/01/2018 1021   RBC 4.48 04/01/2018 1021   HGB 13.5 04/01/2018 1021   HCT 39.8 04/01/2018 1021   PLT 306.0 04/01/2018 1021   MCV 88.8 04/01/2018 1021   MCH 29.6 04/20/2017 0518   MCHC 33.9 04/01/2018 1021   RDW 13.1 04/01/2018 1021   LYMPHSABS 2.1 04/01/2018 1021   MONOABS 0.6 04/01/2018 1021   EOSABS 0.2 04/01/2018 1021   BASOSABS 0.0 04/01/2018 1021    CMP     Component Value Date/Time   NA 145 08/23/2018 0926   K 4.2 08/23/2018 0926   CL 107 08/23/2018 0926   CO2 28 08/23/2018 0926   GLUCOSE 96 08/23/2018 0926   BUN 13 08/23/2018 0926   CREATININE 0.85 08/23/2018 0926   CALCIUM 9.3 08/23/2018 0926   PROT 6.3 04/01/2018 1021   ALBUMIN 4.1 04/01/2018 1021   AST 12 04/01/2018 1021   ALT 11 04/01/2018 1021   ALKPHOS 62 04/01/2018 1021   BILITOT 0.5 04/01/2018 1021   GFRNONAA >60 04/20/2017 0518   GFRAA >60 04/20/2017 0518    ASSESSMENT/PLAN: 72 year old female with Parkinson's disease, GERD with 4 cm hiatal hernia, history of bipolar disorder who presents for outpatient endoscopy.   1.  Dysphagia/GERD with hiatal hernia --balloon dilation has helped dysphagia in the past despite there being no definitive stricture.  Given significant improvement after dilation she returns today for upper endoscopy with dilation.  Monitored anesthesia care. The nature of the procedure, as well as the risks, benefits, and alternatives were carefully and thoroughly reviewed with the patient. Ample time for discussion and questions allowed. The patient understood, was satisfied, and agreed to proceed.       Cc:No referring provider defined for this  encounter.

## 2018-12-19 NOTE — Discharge Instructions (Signed)

## 2018-12-19 NOTE — Anesthesia Preprocedure Evaluation (Addendum)
Anesthesia Evaluation  Patient identified by MRN, date of birth, ID band Patient awake    Reviewed: Allergy & Precautions, NPO status , Patient's Chart, lab work & pertinent test results  Airway Mallampati: II  TM Distance: >3 FB Neck ROM: Full    Dental no notable dental hx. (+) Teeth Intact   Pulmonary neg pulmonary ROS,    Pulmonary exam normal breath sounds clear to auscultation       Cardiovascular hypertension, Pt. on medications negative cardio ROS Normal cardiovascular exam Rhythm:Regular Rate:Normal     Neuro/Psych  Headaches, PSYCHIATRIC DISORDERS Depression Bipolar Disorder Dementia Poor short term memory Parkinson's disease    GI/Hepatic Neg liver ROS, hiatal hernia, GERD  Medicated and Controlled,Dysphagia   Endo/Other  negative endocrine ROSHyperlipidemia  Renal/GU negative Renal ROS  negative genitourinary   Musculoskeletal  (+) Arthritis , Osteoarthritis,  Chronic LBP   Abdominal Normal abdominal exam  (+)   Peds  Hematology negative hematology ROS (+)   Anesthesia Other Findings   Reproductive/Obstetrics                            Anesthesia Physical  Anesthesia Plan  ASA: II  Anesthesia Plan: MAC   Post-op Pain Management:    Induction:   PONV Risk Score and Plan: 2 and Ondansetron, Propofol infusion and Treatment may vary due to age or medical condition  Airway Management Planned: Natural Airway and Nasal Cannula  Additional Equipment:   Intra-op Plan:   Post-operative Plan:   Informed Consent: I have reviewed the patients History and Physical, chart, labs and discussed the procedure including the risks, benefits and alternatives for the proposed anesthesia with the patient or authorized representative who has indicated his/her understanding and acceptance.       Plan Discussed with: CRNA and Surgeon  Anesthesia Plan Comments:          Anesthesia Quick Evaluation

## 2018-12-27 MED ORDER — AMBULATORY NON FORMULARY MEDICATION: 10.0000 mg | Freq: Three times a day (TID) | 1 refills | Status: DC

## 2018-12-27 NOTE — Telephone Encounter (Signed)
Pt called back in regards to her refill for Domperidone

## 2018-12-27 NOTE — Telephone Encounter (Signed)
Sent Rx to [facility]. I have spoken to patient to advise her of this and have apologized for the delay as I am unsure why I had not seen this in my box sooner (she called first on 12/14/18 but I am just now seeing).

## 2019-02-21 ENCOUNTER — Other Ambulatory Visit: Payer: Self-pay | Admitting: Family Medicine

## 2019-02-21 NOTE — Telephone Encounter (Signed)
Patient is requesting dose increase she said current dose is not working very well

## 2019-02-26 ENCOUNTER — Other Ambulatory Visit: Payer: Self-pay | Admitting: Family Medicine

## 2019-03-08 ENCOUNTER — Ambulatory Visit: Payer: Medicare Other | Admitting: Neurology

## 2019-03-17 ENCOUNTER — Other Ambulatory Visit: Payer: Self-pay | Admitting: Family Medicine

## 2019-03-20 NOTE — Telephone Encounter (Signed)
Dr. Fry please advise. Thanks  

## 2019-03-28 ENCOUNTER — Other Ambulatory Visit: Payer: Self-pay | Admitting: Family Medicine

## 2019-04-09 ENCOUNTER — Other Ambulatory Visit: Payer: Self-pay | Admitting: Family Medicine

## 2019-04-19 ENCOUNTER — Other Ambulatory Visit: Payer: Self-pay | Admitting: Neurology

## 2019-05-19 NOTE — Progress Notes (Signed)
Rcvd fax from [place], [place]. They have been unable after several attempts to connect with Pt to schedule appointment.  Any questions [phone]

## 2019-07-21 ENCOUNTER — Other Ambulatory Visit: Payer: Self-pay | Admitting: Family Medicine

## 2019-07-21 DIAGNOSIS — Z1231 Encounter for screening mammogram for malignant neoplasm of breast: Secondary | ICD-10-CM

## 2019-08-06 ENCOUNTER — Other Ambulatory Visit: Payer: Self-pay | Admitting: Family Medicine

## 2019-08-08 NOTE — Telephone Encounter (Signed)
Okay for refill?  

## 2019-08-14 ENCOUNTER — Telehealth: Payer: Self-pay | Admitting: Internal Medicine

## 2019-08-14 ENCOUNTER — Other Ambulatory Visit: Payer: Self-pay | Admitting: Family Medicine

## 2019-08-14 NOTE — Telephone Encounter (Signed)
Requested medication (s) are due for refill today: yes  Requested medication (s) are on the active medication list: yes  Last refill:  02/2019  Future visit scheduled: no  Notes to clinic:  Refill cannot be delegated    Requested Prescriptions  Pending Prescriptions Disp Refills   Eszopiclone 3 MG TABS 90 tablet 1    Sig: Take immediately before bedtime     Not Delegated - Psychiatry:  Anxiolytics/Hypnotics Failed - 08/14/2019 12:12 PM      Failed - This refill cannot be delegated      Failed - Urine Drug Screen completed in last 360 days.      Failed - Valid encounter within last 6 months    Recent Outpatient Visits          9 months ago Chronic pain of right knee   Therapist, music at [place], MD   10 months ago Pain, Engineer, manufacturing at [place], MD   11 months ago Delayed gastric emptying   Therapist, music at [facility], Alinda Sierras, MD   11 months ago Acute midline low back pain, unspecified whether sciatica present   Therapist, music at [facility], Steele Berg, MD   11 months ago Episodic cluster headache, not intractable   Therapist, music at [place], MD              ibuprofen (ADVIL) 800 MG tablet 270 tablet 3    Sig: Take 1 tablet (800 mg total) by mouth every 8 (eight) hours as needed. for pain     Analgesics:  NSAIDS Failed - 08/14/2019 12:12 PM      Failed - HGB in normal range and within 360 days    Hemoglobin  Date Value Ref Range Status  04/01/2018 13.5 12.0 - 15.0 g/dL Final         Passed - Cr in normal range and within 360 days    Creatinine, Ser  Date Value Ref Range Status  08/23/2018 0.85 0.40 - 1.20 mg/dL Final         Passed - Patient is not pregnant      Passed - Valid encounter within last 12 months    Recent Outpatient Visits          9 months ago Chronic pain of right knee   Therapist, music at [place], MD   10 months ago  Pain, Engineer, manufacturing at [facility], Ishmael Holter, MD   11 months ago Delayed gastric emptying   Therapist, music at [facility], Alinda Sierras, MD   11 months ago Acute midline low back pain, unspecified whether sciatica present   Therapist, music at [facility] [facility], Steele Berg, MD   11 months ago Episodic cluster headache, not intractable   Therapist, music at [facility], Ishmael Holter, MD

## 2019-08-14 NOTE — Telephone Encounter (Signed)
Medication Refill - Medication:  Eszopiclone 3 MG TABS ibuprofen (ADVIL) 800 MG tablet   Has the patient contacted their pharmacy?  Yes advised to call office.  Preferred Pharmacy (with phone number or street name):  [place], [place] [phone] (Phone) [phone] (Fax)   Agent: Please be advised that RX refills may take up to 3 business days. We ask that you follow-up with your pharmacy.

## 2019-08-14 NOTE — Telephone Encounter (Signed)
Ok for refill? 

## 2019-08-14 NOTE — Telephone Encounter (Signed)
Patient has been advised that Dr Sarajane Jews sent #180 capsules with 3 refills to [facility] on 04/10/2019. She should have enough until 04/09/20. She indicates that she will look at her bottle and will let us know if for some reason her bottle does not have refills on it.

## 2019-08-15 MED ORDER — IBUPROFEN 800 MG PO TABS: 800.0000 mg | ORAL_TABLET | Freq: Three times a day (TID) | ORAL | 3 refills | Status: DC | PRN

## 2019-08-15 MED ORDER — ESZOPICLONE 3 MG PO TABS: 3.0000 mg | ORAL_TABLET | Freq: Every day | ORAL | 1 refills | Status: DC

## 2019-08-18 ENCOUNTER — Ambulatory Visit: Payer: Medicare Other | Admitting: Family Medicine

## 2019-09-04 ENCOUNTER — Other Ambulatory Visit: Payer: Self-pay

## 2019-09-04 ENCOUNTER — Ambulatory Visit
Admission: RE | Admit: 2019-09-04 | Discharge: 2019-09-04 | Disposition: A | Discharge status: Home / Self Care | Payer: Medicare Other | Source: Ambulatory Visit | Attending: Family Medicine | Admitting: Family Medicine

## 2019-09-04 DIAGNOSIS — Z1231 Encounter for screening mammogram for malignant neoplasm of breast: Secondary | ICD-10-CM | POA: Diagnosis not present

## 2019-09-10 HISTORY — PX: EYE SURGERY: SHX253

## 2019-10-03 ENCOUNTER — Other Ambulatory Visit: Payer: Self-pay

## 2019-10-05 ENCOUNTER — Other Ambulatory Visit: Payer: Self-pay

## 2019-10-05 ENCOUNTER — Encounter (HOSPITAL_COMMUNITY): Payer: Self-pay

## 2019-10-05 ENCOUNTER — Emergency Department (HOSPITAL_COMMUNITY)
Admission: EM | Admit: 2019-10-05 | Discharge: 2019-10-06 | Disposition: A | Discharge status: Other Acute Inpatient Hospital | Payer: Medicare Other | Attending: Emergency Medicine | Admitting: Emergency Medicine

## 2019-10-05 DIAGNOSIS — H538 Other visual disturbances: Secondary | ICD-10-CM | POA: Insufficient documentation

## 2019-10-05 DIAGNOSIS — G20 Parkinson's disease: Secondary | ICD-10-CM | POA: Diagnosis not present

## 2019-10-05 DIAGNOSIS — Z79899 Other long term (current) drug therapy: Secondary | ICD-10-CM | POA: Diagnosis not present

## 2019-10-05 DIAGNOSIS — F0390 Unspecified dementia without behavioral disturbance: Secondary | ICD-10-CM | POA: Diagnosis not present

## 2019-10-05 DIAGNOSIS — H40211 Acute angle-closure glaucoma, right eye: Secondary | ICD-10-CM | POA: Diagnosis not present

## 2019-10-05 DIAGNOSIS — R519 Headache, unspecified: Secondary | ICD-10-CM | POA: Diagnosis present

## 2019-10-05 DIAGNOSIS — Z20828 Contact with and (suspected) exposure to other viral communicable diseases: Secondary | ICD-10-CM | POA: Diagnosis not present

## 2019-10-05 MED ORDER — ONDANSETRON HCL 4 MG/2ML IJ SOLN
4.0000 mg | Freq: Once | INTRAMUSCULAR | Status: AC
  Administered 2019-10-05: 4 mg via INTRAVENOUS
  Filled 2019-10-05: qty 2

## 2019-10-05 MED ORDER — METOCLOPRAMIDE HCL 5 MG/ML IJ SOLN
10.0000 mg | Freq: Once | INTRAMUSCULAR | Status: DC
  Filled 2019-10-05: qty 2

## 2019-10-05 MED ORDER — FENTANYL CITRATE (PF) 100 MCG/2ML IJ SOLN
50.0000 ug | Freq: Once | INTRAMUSCULAR | Status: AC
  Administered 2019-10-05: 50 ug via INTRAVENOUS
  Filled 2019-10-05: qty 2

## 2019-10-05 MED ORDER — TIMOLOL MALEATE 0.5 % OP SOLN
1.0000 [drp] | Freq: Once | OPHTHALMIC | Status: AC
  Administered 2019-10-05: 1 [drp] via OPHTHALMIC
  Filled 2019-10-05: qty 5

## 2019-10-05 MED ORDER — STERILE WATER FOR INJECTION IJ SOLN
INTRAMUSCULAR | Status: AC
  Administered 2019-10-05: 5 mL
  Filled 2019-10-05: qty 10

## 2019-10-05 MED ORDER — PILOCARPINE HCL 2 % OP SOLN
1.0000 [drp] | Freq: Once | OPHTHALMIC | Status: AC
  Administered 2019-10-05: 1 [drp] via OPHTHALMIC
  Filled 2019-10-05: qty 15

## 2019-10-05 MED ORDER — DIPHENHYDRAMINE HCL 50 MG/ML IJ SOLN
25.0000 mg | Freq: Once | INTRAMUSCULAR | Status: DC
  Filled 2019-10-05: qty 1

## 2019-10-05 MED ORDER — TETRACAINE HCL 0.5 % OP SOLN
2.0000 [drp] | Freq: Once | OPHTHALMIC | Status: AC
  Administered 2019-10-05: 2 [drp] via OPHTHALMIC
  Filled 2019-10-05: qty 4

## 2019-10-05 MED ORDER — ACETAZOLAMIDE SODIUM 500 MG IJ SOLR
500.0000 mg | Freq: Once | INTRAMUSCULAR | Status: AC
  Administered 2019-10-05: 22:00:00 500 mg via INTRAVENOUS
  Filled 2019-10-05: qty 500

## 2019-10-05 NOTE — ED Provider Notes (Signed)
73 year old female received at sign out from Audrey Brown. Per her HPI:   "Audrey Brown is Brown 73 y.o. female with past medical history of Parkinson disease, dementia, cluster headache, presenting to the emergency department with complaint of right-sided headache that began on Monday.  She states she developed right-sided headache that feels similar to prior cluster headaches, however she reports it is slightly worse.  With her cluster headache she typically gets right eye lacrimation, blurry vision in the right eye as well, however yesterday she states the vision in her right eye significantly worsened.  She states yesterday she noticed her right eye was red and very painful.  She is having trouble opening her eye and her vision is very poor.  She states she is unable to make out any shapes and can barely see.  She states her eye feels irritated as well.  She states this symptom is new and not typical of her prior headaches.  She treated with tramadol, however symptoms gradually worsened.  Patient endorses associated nausea with vomiting, photophobia, phonophobia.  No fevers. Per chart review, patient is followed by [facility]  neurology for her cluster headaches."  Physical Exam  BP (!) 157/87 (BP Location: Left Arm)   Pulse 74   Temp 98.1 F (36.7 C) (Oral)   Resp 18   SpO2 95%   Physical Exam Vitals signs and nursing note reviewed.  Constitutional:      Appearance: She is well-developed.  HENT:     Head: Normocephalic and atraumatic.  Eyes:     Comments: Hazy pupil that is fixed and dilated and not reactive to light.  Neck:     Musculoskeletal: Normal range of motion.  Musculoskeletal: Normal range of motion.  Neurological:     Mental Status: She is alert and oriented to person, place, and time.     ED Course/Procedures   Clinical Course as of Oct 05 314  Thu Oct 05, 2019  2049 Consulted with Dr. Baird Cancer with ophthalmology.  Recommends diamox 500mg .  Recommends 2% pilocarpine, 1  dose every 10 min q3  Administer pilocarpine 5 min before timolol, in the same manner. Recheck pressures 30 min after last dose and call back.    [JR]  2347 On re-evaluation, pt reports HA somewhat improved however pain and blurriness in right eye persists. IOP in right eye remains elevated at 49. Reconsulted ophthalmology, Dr. Baird Cancer. Will locate [name] for hopeful emergent management.    [JR]  2351 Unfortunately, both this hospital and [facility] do not have [name] available for ophthalmology use.  Dr. Baird Cancer recommends attempted transfer to [facility], as patient would most benefit from YAG laser procedure to reduce IOP.  If transfer is unavailable, recommends discharge with Rx for oral diamox and eye drops.  Pilocarpine and timolol and alphagam (bromonidine) 1 drop once per hour for Brown total of 3 times tonight. Drops spaced out by Brown few minutes. oral diamox 500mg  bid.  Dr. Baird Cancer to attempt to find local eye dr with laser available for tomorrow. He will contact pt with information.     [JR]    Clinical Course User Index [JR] Robinson, [place] N, PA-C    .Critical Care Performed by: Joanne Gavel, PA-C Authorized by: Joanne Gavel, PA-C   Critical care provider statement:    Critical care time (minutes):  40   Critical care time was exclusive of:  Separately billable procedures and treating other patients and teaching time  Critical care was necessary to treat or prevent imminent or life-threatening deterioration of the following conditions: Acute closed angle glaucoma of the right eye.   Critical care was time spent personally by me on the following activities:  Discussions with consultants, re-evaluation of patient's condition, review of old charts and development of treatment plan with patient or surrogate   I assumed direction of critical care for this patient from another provider in my specialty: yes      MDM   73 year old female received at signout from [place].  In brief, the patient has acute closed angle glaucoma.    Dr. Baird Cancer, on-call ophthalmologist for [facility], was consulted.  The patient has had failed medication management with pilocarpine, timolol, and bromonidine with no improvement in IOP.   Dr. Baird Cancer recommends [name] for emergent management.  Unfortunately, this equipment is not available within the [facility] health system.    Consulted ophthalmology and spoke with Dr. Archer Asa at [facility] to has accepted the patient for transfer.    EMTALA completed by Dr. Florina Ou. Rapid Covid test is negative.  Patient is stable and in no acute distress at the time of transfer.       Audrey Maxcy A, PA-C 10/06/19 0315    Audrey Rosser, MD 10/06/19 [phone]

## 2019-10-05 NOTE — ED Provider Notes (Signed)
[facility] DEPT Provider Note   CSN: [id number] Arrival date & time: 10/05/19  1944     History   Chief Complaint Chief Complaint  Patient presents with  . Blurred Vision  . Headache    HPI Audrey Brown is a 73 y.o. female with past medical history of Parkinson disease, dementia, cluster headache, presenting to the emergency department with complaint of right-sided headache that began on Monday.  She states she developed right-sided headache that feels similar to prior cluster headaches, however she reports it is slightly worse.  With her cluster headache she typically gets right eye lacrimation, blurry vision in the right eye as well, however yesterday she states the vision in her right eye significantly worsened.  She states yesterday she noticed her right eye was red and very painful.  She is having trouble opening her eye and her vision is very poor.  She states she is unable to make out any shapes and can barely see.  She states her eye feels irritated as well.  She states this symptom is new and not typical of her prior headaches.  She treated with tramadol, however symptoms gradually worsened.  Patient endorses associated nausea with vomiting, photophobia, phonophobia.  No fevers. Per chart review, patient is followed by [facility]  neurology for her cluster headaches.     The history is provided by the patient and medical records.    Past Medical History:  Diagnosis Date  . Arthritis    lower back  . Bipolar disorder ([place])   . Depression   . GERD (gastroesophageal reflux disease)   . Hiatal hernia   . Parkinson disease ([place])   . Poor short-term memory   . Sagittal band rupture at metacarpophalangeal joint 11/2016   right long finger    Patient Active Problem List   Diagnosis Date Noted  . Pain in right knee 11/22/2018  . Other constipation 09/21/2018  . Delayed gastric emptying 09/14/2018  . Insomnia 06/09/2017  . Chronic mid back  pain 08/14/2014  . Dementia in corticobasal degeneration ([place]) 04/10/2014  . Abnormality of gait 04/10/2014  . Lumbosacral spondylosis without myelopathy 04/10/2014  . Dysphagia 06/16/2011  . GERD with stricture 06/16/2011  . Bipolar 1 disorder, mixed ([place]) 06/16/2011  . Parkinsonian syndrome ([place]) 06/16/2011  . Closed fracture of lumbar vertebra ([place]) 11/26/2010  . PARKINSONISM 11/13/2010  . TINEA CORPORIS 07/31/2010  . CELLULITIS, LEGS 07/31/2010  . RESTING TREMOR 07/31/2010  . DEPENDENT EDEMA 06/25/2009  . GERD 11/19/2008  . WEAKNESS 11/19/2008  . Depression 01/02/2008  . HEADACHE 01/02/2008  . CHICKENPOX, HX OF 01/02/2008    Past Surgical History:  Procedure Laterality Date  . ANKLE SURGERY    . BALLOON DILATION N/A 10/29/2017   Procedure: BALLOON DILATION;  Surgeon: Jerene Bears, MD;  Location: [facility];  Service: Gastroenterology;  Laterality: N/A;  . CERVICAL [place]  . CESAREAN SECTION    . CHOLECYSTECTOMY    . CYST REMOVAL NECK    . ESOPHAGEAL DILATION  12/19/2018   Procedure: ESOPHAGEAL DILATION;  Surgeon: Jerene Bears, MD;  Location: [facility];  Service: Gastroenterology;;  . ESOPHAGOGASTRODUODENOSCOPY (EGD) WITH ESOPHAGEAL DILATION  07/27/2012   with Propofol  . ESOPHAGOGASTRODUODENOSCOPY (EGD) WITH PROPOFOL N/A 06/23/2016   Procedure: ESOPHAGOGASTRODUODENOSCOPY (EGD) WITH PROPOFOL;  Surgeon: Jerene Bears, MD;  Location: [facility];  Service: Gastroenterology;  Laterality: N/A;  . ESOPHAGOGASTRODUODENOSCOPY (EGD) WITH PROPOFOL N/A 10/29/2017   Procedure: ESOPHAGOGASTRODUODENOSCOPY (EGD) WITH  PROPOFOL;  Surgeon: Jerene Bears, MD;  Location: [facility];  Service: Gastroenterology;  Laterality: N/A;  . ESOPHAGOGASTRODUODENOSCOPY (EGD) WITH PROPOFOL N/A 12/19/2018   Procedure: ESOPHAGOGASTRODUODENOSCOPY (EGD) WITH PROPOFOL;  Surgeon: Jerene Bears, MD;  Location: [facility];  Service: Gastroenterology;  Laterality: N/A;  . FOOT NEUROMA SURGERY  Left   . ORIF DISTAL RADIUS FRACTURE Right 07/04/2010  . SAVORY DILATION N/A 06/23/2016   Procedure: SAVORY DILATION;  Surgeon: Jerene Bears, MD;  Location: [facility];  Service: Gastroenterology;  Laterality: N/A;  . TENDON REPAIR Right 12/01/2016   Procedure: right long finger ulnar sagittal band reconstruction;  Surgeon: Leanora Cover, MD;  Location: [facility];  Service: Orthopedics;  Laterality: Right;  . TONSILLECTOMY       OB History   No obstetric history on file.      Home Medications    Prior to Admission medications   Medication Sig Start Date End Date Taking? Authorizing Provider  ALPRAZolam (XANAX) 0.25 MG tablet Take 1 tablet (0.25 mg total) by mouth 3 (three) times daily as needed for anxiety. 12/23/17  Yes Laurey Morale, MD  AMBULATORY NON FORMULARY MEDICATION Take 10 mg by mouth 3 (three) times daily. Medication Name: Domperidone 10 mg 12/27/18  Yes Pyrtle, Lajuan Lines, MD  buPROPion (WELLBUTRIN XL) 300 MG 24 hr tablet Take 300 mg by mouth daily.  03/27/16  Yes [provider]  busPIRone (BUSPAR) 10 MG tablet Take 10 mg by mouth 2 (two) times daily.    Yes [provider]  calcium-vitamin D (OSCAL WITH D) 500-200 MG-UNIT tablet Take 1 tablet by mouth 2 (two) times daily.   Yes [provider]  esomeprazole (NEXIUM) 40 MG capsule TAKE 1 CAPSULE TWICE A DAY BEFORE MEALS Patient taking differently: Take 40 mg by mouth 2 (two) times daily before a meal.  04/10/19  Yes Laurey Morale, MD  ibuprofen (ADVIL) 800 MG tablet Take 1 tablet (800 mg total) by mouth every 8 (eight) hours as needed. for pain 08/15/19  Yes Laurey Morale, MD  LamoTRIgine XR (LAMICTAL XR) 200 MG TB24 Take 1 tablet (200 mg total) by mouth daily. 12/15/13  Yes Laurey Morale, MD  Magnesium 500 MG CAPS Take 400 mg by mouth at bedtime.    Yes [provider]  Melatonin 5 MG CAPS Take 5 mg by mouth at bedtime.   Yes [provider]  OSCIMIN 0.125 MG SUBL PLACE 1  TABLET UNDER THE TONGUE EVERY 6 HOURS AS NEEDED FOR CRAMPING Patient taking differently: Place 0.125 mg under the tongue every 6 (six) hours as needed (cramping).  02/09/17  Yes Pyrtle, Lajuan Lines, MD  promethazine (PHENERGAN) 25 MG tablet Take 1 tablet (25 mg total) by mouth every 4 (four) hours as needed for nausea. 04/21/17  Yes Laurey Morale, MD  propranolol (INDERAL) 10 MG tablet Take 10 mg by mouth 2 (two) times daily. 05/20/19  Yes [provider]  temazepam (RESTORIL) 30 MG capsule Take 30 mg by mouth at bedtime.   Yes [provider]  traMADol (ULTRAM) 50 MG tablet TAKE 2 TABLETS BY MOUTH EVERY 8 HOURS AS NEEDED FOR MODERATE PAIN Patient taking differently: Take 100 mg by mouth every 8 (eight) hours as needed for moderate pain.  08/08/19  Yes Laurey Morale, MD  verapamil (CALAN) 80 MG tablet TAKE 1 TABLET THREE TIMES A DAY Patient taking differently: Take 80 mg by mouth 3 (three) times daily.  04/19/19  Yes Pieter Partridge, DO    Family History Family History  Problem Relation Age of Onset  . Emphysema Mother   . Heart attack Father   . Hypertension Other   . Lung cancer Other     Social History Social History   Tobacco Use  . Smoking status: Never Smoker  . Smokeless tobacco: Never Used  Substance Use Topics  . Alcohol use: No    Alcohol/week: 0.0 standard drinks  . Drug use: No     Allergies   Celebrex [celecoxib], Morphine and related, Penicillins, Risperdal [risperidone], and Statins   Review of Systems Review of Systems  Constitutional: Negative for fever.  Eyes: Positive for photophobia, pain, redness and visual disturbance.  Gastrointestinal: Positive for nausea and vomiting.  Neurological: Positive for headaches.  All other systems reviewed and are negative.    Physical Exam Updated Vital Signs BP (!) 176/86   Pulse 80   Temp 98.3 F (36.8 C) (Oral)   Resp 18   SpO2 95%   Physical Exam Vitals signs and nursing note reviewed.   Constitutional:      Appearance: She is well-developed.  HENT:     Head: Normocephalic and atraumatic.  Eyes:     Comments: Right eye with significant conjunctival injection.  The pupil is mid dilated and fixed, nonreactive to light.  Pupil also appears slightly hazy.  Patient is unable to tell me how many fingers I am holding up when looking at the right eye. Left eye appears normal with reactive pupil. EOMs are normal. IOP avg in left: 18 IOP avg in right: 50  Cardiovascular:     Rate and Rhythm: Normal rate and regular rhythm.  Pulmonary:     Effort: Pulmonary effort is normal. No respiratory distress.     Breath sounds: Normal breath sounds.  Abdominal:     Palpations: Abdomen is soft.  Skin:    General: Skin is warm.  Neurological:     Mental Status: She is alert and oriented to person, place, and time.     Comments: Mental Status:  Alert, oriented, thought content appropriate, able to give a coherent history. Speech fluent without evidence of aphasia. Able to follow 2 step commands without difficulty.  Cranial Nerves:  II:  Peripheral visual fields grossly normal in the left eye, vision significantly impaired in the right unable to perform visual field exam in the right eye III,IV, VI: ptosis not present, extra-ocular motions intact bilaterally  V,VII: smile symmetric, facial light touch sensation equal VIII: hearing grossly normal to voice  X: uvula elevates symmetrically  XI: bilateral shoulder shrug symmetric and strong XII: midline tongue extension without fassiculations Motor:  Resting tremor is present. Normal tone.  Sensory: grossly normal in all extremities.  CV: distal pulses palpable throughout     Psychiatric:        Behavior: Behavior normal.      ED Treatments / Results  Labs (all labs ordered are listed, but only abnormal results are displayed) Labs Reviewed - No data to display  EKG None  Radiology No results found.  Procedures .Critical  Care Performed by: Kilo Eshelman, [place] N, PA-C Authorized by: Teondra Newburg, [place] N, PA-C   Critical care provider statement:    Critical care time (minutes):  45   Critical care time was exclusive of:  Separately billable procedures and treating other patients and teaching time   Critical care was necessary to treat or prevent imminent or life-threatening deterioration of the following conditions:  permanent vision loss.   Critical care was time spent personally by me on the following activities:  Discussions with consultants, evaluation of patient's response to treatment, examination of patient, ordering and performing treatments and interventions, ordering and review of laboratory studies, ordering and review of radiographic studies, pulse oximetry, re-evaluation of patient's condition, obtaining history from patient or surrogate and review of old charts   I assumed direction of critical care for this patient from another provider in my specialty: no     (including critical care time)  Medications Ordered in ED Medications  HYDROmorphone (DILAUDID) injection 0.5 mg (has no administration in time range)  metoCLOPramide (REGLAN) injection 10 mg (has no administration in time range)  tetracaine (PONTOCAINE) 0.5 % ophthalmic solution 2 drop (2 drops Both Eyes Given by Other 10/05/19 2127)  timolol (TIMOPTIC) 0.5 % ophthalmic solution 1 drop (1 drop Right Eye Given 10/05/19 2220)  pilocarpine (PILOCAR) 2 % ophthalmic solution 1 drop (1 drop Right Eye Given 10/05/19 2215)  fentaNYL (SUBLIMAZE) injection 50 mcg (50 mcg Intravenous Given 10/05/19 2112)  acetaZOLAMIDE (DIAMOX) injection 500 mg (500 mg Intravenous Given 10/05/19 2206)  ondansetron (ZOFRAN) injection 4 mg (4 mg Intravenous Given 10/05/19 2112)  sterile water (preservative free) injection (5 mLs  Given 10/05/19 2206)     Initial Impression / Assessment and Plan / ED Course  I have reviewed the triage vital signs and the nursing notes.   Pertinent labs & imaging results that were available during my care of the patient were reviewed by me and considered in my medical decision making (see chart for details).   Pt with presentation consistent with acute angle closure glaucoma with inc IOP, right eye pain with significant blurry vision, right sided HA, N/V. Hx of cluster HA however eye sx are new. Exam with conjunctival injection, fixed mid dilated pupil, inc IOP. No other focal neuro deficits. Emergent consult to ophthalmology placed. Pain medications ordered.  Clinical Course as of Oct 05 14  Thu Oct 05, 2019  2049 Consulted with Dr. Baird Cancer with ophthalmology.  Recommends diamox 500mg .  Recommends 2% pilocarpine, 1 dose every 10 min q3  Administer pilocarpine 5 min before timolol, in the same manner. Recheck pressures 30 min after last dose and call back.    [JR]  2347 On re-evaluation, pt reports HA somewhat improved however pain and blurriness in right eye persists. IOP in right eye remains elevated at 49. Reconsulted ophthalmology, Dr. Baird Cancer. Will locate a yag laser for hopeful emergent management.    [JR]  2351 Unfortunately, both this hospital and [facility] do not have a YAG laser available for ophthalmology use.  Dr. Baird Cancer recommends attempted transfer to [facility], as patient would most benefit from YAG laser procedure to reduce IOP.  If transfer is unavailable, recommends discharge with Rx for oral diamox and eye drops.  Pilocarpine and timolol and alphagam (bromonidine) 1 drop once per hour for a total of 3 times tonight. Drops spaced out by a few minutes. oral diamox 500mg  bid.  Dr. Baird Cancer to attempt to find local eye dr with laser available for tomorrow. He will contact pt with information.     [JR]    Clinical Course User Index [JR] Breonia Kirstein, [place] N, PA-C   Care assumed at shift change by PA McDonald, pending care coordination for attempted transfer to [facility] for procedural intervention with Yag  laser.       Final Clinical Impressions(s) / ED Diagnoses   Final diagnoses:  Acute angle-closure glaucoma of right eye    ED Discharge Orders    None       Kinzi Frediani, [place] N, PA-C 10/06/19 0020    Malvin Johns, MD 10/06/19 [phone]

## 2019-10-05 NOTE — ED Triage Notes (Addendum)
Pt coming in c/o cluster headache since Monday with new onset of blurry vision in the right eye that started Wednesday morning. Pt starting to note vision changes in left eye now. No facial droop, no numbness/ tingling, no new weakness in extremities.

## 2019-10-06 DIAGNOSIS — R110 Nausea: Secondary | ICD-10-CM | POA: Diagnosis not present

## 2019-10-06 DIAGNOSIS — H40032 Anatomical narrow angle, left eye: Secondary | ICD-10-CM | POA: Diagnosis not present

## 2019-10-06 DIAGNOSIS — H40211 Acute angle-closure glaucoma, right eye: Secondary | ICD-10-CM | POA: Diagnosis not present

## 2019-10-06 DIAGNOSIS — H40213 Acute angle-closure glaucoma, bilateral: Secondary | ICD-10-CM | POA: Diagnosis not present

## 2019-10-06 DIAGNOSIS — Z8659 Personal history of other mental and behavioral disorders: Secondary | ICD-10-CM | POA: Diagnosis not present

## 2019-10-06 DIAGNOSIS — R519 Headache, unspecified: Secondary | ICD-10-CM | POA: Diagnosis not present

## 2019-10-06 LAB — POC SARS CORONAVIRUS 2 AG -  ED: SARS Coronavirus 2 Ag: NEGATIVE

## 2019-10-06 MED ORDER — HYDROMORPHONE HCL 1 MG/ML IJ SOLN
0.5000 mg | Freq: Once | INTRAMUSCULAR | Status: AC
  Administered 2019-10-06: 0.5 mg via INTRAVENOUS
  Filled 2019-10-06: qty 1

## 2019-10-06 MED ORDER — TIMOLOL MALEATE 0.5 % OP SOLN
1.0000 [drp] | Freq: Once | OPHTHALMIC | Status: DC
  Filled 2019-10-06: qty 5

## 2019-10-06 MED ORDER — PILOCARPINE HCL 2 % OP SOLN: 1.0000 [drp] | Freq: Three times a day (TID) | OPHTHALMIC | Status: DC

## 2019-10-06 MED ORDER — BRIMONIDINE TARTRATE 0.15 % OP SOLN
1.0000 [drp] | Freq: Three times a day (TID) | OPHTHALMIC | Status: DC
  Filled 2019-10-06: qty 5

## 2019-10-06 MED ORDER — METOCLOPRAMIDE HCL 5 MG/ML IJ SOLN
10.0000 mg | Freq: Once | INTRAMUSCULAR | Status: AC
  Administered 2019-10-06: 10 mg via INTRAVENOUS
  Filled 2019-10-06: qty 2

## 2019-10-06 NOTE — ED Notes (Addendum)
Patient received a total of 3 rounds of the eye drop regimen (1 drop of Pilocar, 1 drop Timoptic x3) via verbal orders from [place] Robinson. First round received at 2215-2252. Second round received 0025-0105. Third round received [id number].

## 2019-10-06 NOTE — ED Notes (Signed)
[facility] contacted and given report.

## 2019-10-17 ENCOUNTER — Telehealth: Payer: Self-pay | Admitting: Internal Medicine

## 2019-10-18 ENCOUNTER — Ambulatory Visit: Payer: Medicare Other

## 2019-10-18 DIAGNOSIS — H40213 Acute angle-closure glaucoma, bilateral: Secondary | ICD-10-CM | POA: Diagnosis not present

## 2019-10-18 DIAGNOSIS — H2513 Age-related nuclear cataract, bilateral: Secondary | ICD-10-CM | POA: Diagnosis not present

## 2019-10-18 DIAGNOSIS — H4020X4 Unspecified primary angle-closure glaucoma, indeterminate stage: Secondary | ICD-10-CM | POA: Diagnosis not present

## 2019-10-20 MED ORDER — AMBULATORY NON FORMULARY MEDICATION: 10.0000 mg | Freq: Three times a day (TID) | 0 refills | Status: DC

## 2019-10-20 NOTE — Telephone Encounter (Signed)
Rx sent to [facility]. Needs appt for further refills.

## 2019-10-25 ENCOUNTER — Ambulatory Visit (INDEPENDENT_AMBULATORY_CARE_PROVIDER_SITE_OTHER): Payer: Medicare Other

## 2019-10-25 VITALS — Ht 63.0 in | Wt 180.0 lb

## 2019-10-25 DIAGNOSIS — Z780 Asymptomatic menopausal state: Secondary | ICD-10-CM | POA: Diagnosis not present

## 2019-10-25 DIAGNOSIS — Z0000 Encounter for general adult medical examination without abnormal findings: Secondary | ICD-10-CM | POA: Diagnosis not present

## 2019-10-25 NOTE — Patient Instructions (Addendum)
Audrey Brown , Thank you for taking time to participate in your Medicare Wellness Visit. I appreciate your ongoing commitment to your health goals. Please review the following plan we discussed and let me know if I can assist you in the future.   Screening recommendations/referrals: Colorectal Screening: patient reports [facility] has her complete FOBT annually and they have all been negative. Mammogram: Completed 09/04/2019; up to date; due again 09/04/2020. Bone Density: completed 02/01/2018; up to date; due again 02/03/2020.  Vision and Dental Exams: Recommended annual ophthalmology exams for early detection of glaucoma and other disorders of the eye Recommended annual dental exams for proper oral hygiene  Diabetic Exams: Diabetic Eye Exam: N/A Diabetic Foot Exam: N/A  Vaccinations: Influenza vaccine: completed 08/10/2019; due again Fall 2021 Pneumococcal vaccine: completed 08/23/2018 & 12/25/2015; up to date. Tdap vaccine: Not in record. Please check with local pharmacy for your out of pocket cost. This may be your cheapest option. Shingles vaccine: Please check with local pharmacy for your out of pocket cost. This may be your cheapest option. This is a series of 2 injections.  Advanced directives: Advance directives discussed with you today. Please bring a copy of your POA (Power of [place]) and/or Living Will to your next appointment.  Goals: Recommend to drink at least 6-8 8oz glasses of water per day.   Recommend to remove any items from the home that may cause slips or trips.  Recommend to decrease portion sizes by eating 3 small healthy meals and at least 2 healthy snacks per day.   Next appointment: Please schedule your Annual Wellness Visit with your Nurse Health Advisor in one year.  Preventive Care 63 Years and Older, Female Preventive care refers to lifestyle choices and visits with your health care provider that can promote health and wellness. What does preventive care  include?  A yearly physical exam. This is also called an annual well check.  Dental exams once or twice a year.  Routine eye exams. Ask your health care provider how often you should have your eyes checked.  Personal lifestyle choices, including:  Daily care of your teeth and gums.  Regular physical activity.  Eating a healthy diet.  Avoiding tobacco and drug use.  Limiting alcohol use.  Practicing safe sex.  Taking low-dose aspirin every day if recommended by your health care provider.  Taking vitamin and mineral supplements as recommended by your health care provider. What happens during an annual well check? The services and screenings done by your health care provider during your annual well check will depend on your age, overall health, lifestyle risk factors, and family history of disease. Counseling  Your health care provider may ask you questions about your:  Alcohol use.  Tobacco use.  Drug use.  Emotional well-being.  Home and relationship well-being.  Sexual activity.  Eating habits.  History of falls.  Memory and ability to understand (cognition).  Work and work Statistician.  Reproductive health. Screening  You may have the following tests or measurements:  Height, weight, and BMI.  Blood pressure.  Lipid and cholesterol levels. These may be checked every 5 years, or more frequently if you are over 30 years old.  Skin check.  Lung cancer screening. You may have this screening every year starting at age 80 if you have a 30-pack-year history of smoking and currently smoke or have quit within the past 15 years.  Fecal occult blood test (FOBT) of the stool. You may have this test every year starting  at age 28.  Flexible sigmoidoscopy or colonoscopy. You may have a sigmoidoscopy every 5 years or a colonoscopy every 10 years starting at age 6.  Hepatitis C blood test.  Hepatitis B blood test.  Sexually transmitted disease (STD)  testing.  Diabetes screening. This is done by checking your blood sugar (glucose) after you have not eaten for a while (fasting). You may have this done every 1-3 years.  Bone density scan. This is done to screen for osteoporosis. You may have this done starting at age 52.  Mammogram. This may be done every 1-2 years. Talk to your health care provider about how often you should have regular mammograms. Talk with your health care provider about your test results, treatment options, and if necessary, the need for more tests. Vaccines  Your health care provider may recommend certain vaccines, such as:  Influenza vaccine. This is recommended every year.  Tetanus, diphtheria, and acellular pertussis (Tdap, Td) vaccine. You may need a Td booster every 10 years.  Zoster vaccine. You may need this after age 66.  Pneumococcal 13-valent conjugate (PCV13) vaccine. One dose is recommended after age 67.  Pneumococcal polysaccharide (PPSV23) vaccine. One dose is recommended after age 43. Talk to your health care provider about which screenings and vaccines you need and how often you need them. This information is not intended to replace advice given to you by your health care provider. Make sure you discuss any questions you have with your health care provider. Document Released: 11/22/2015 Document Revised: 07/15/2016 Document Reviewed: 08/27/2015 Elsevier Interactive Patient Education  2017 [place] Prevention in the Home Falls can cause injuries. They can happen to people of all ages. There are many things you can do to make your home safe and to help prevent falls. What can I do on the outside of my home?  Regularly fix the edges of walkways and driveways and fix any cracks.  Remove anything that might make you trip as you walk through a door, such as a raised step or threshold.  Trim any bushes or trees on the path to your home.  Use bright outdoor lighting.  Clear any walking  paths of anything that might make someone trip, such as rocks or tools.  Regularly check to see if handrails are loose or broken. Make sure that both sides of any steps have handrails.  Any raised decks and porches should have guardrails on the edges.  Have any leaves, snow, or ice cleared regularly.  Use sand or salt on walking paths during winter.  Clean up any spills in your garage right away. This includes oil or grease spills. What can I do in the bathroom?  Use night lights.  Install grab bars by the toilet and in the tub and shower. Do not use towel bars as grab bars.  Use non-skid mats or decals in the tub or shower.  If you need to sit down in the shower, use a plastic, non-slip stool.  Keep the floor dry. Clean up any water that spills on the floor as soon as it happens.  Remove soap buildup in the tub or shower regularly.  Attach bath mats securely with double-sided non-slip rug tape.  Do not have throw rugs and other things on the floor that can make you trip. What can I do in the bedroom?  Use night lights.  Make sure that you have a light by your bed that is easy to reach.  Do not use  any sheets or blankets that are too big for your bed. They should not hang down onto the floor.  Have a firm chair that has side arms. You can use this for support while you get dressed.  Do not have throw rugs and other things on the floor that can make you trip. What can I do in the kitchen?  Clean up any spills right away.  Avoid walking on wet floors.  Keep items that you use a lot in easy-to-reach places.  If you need to reach something above you, use a strong step stool that has a grab bar.  Keep electrical cords out of the way.  Do not use floor polish or wax that makes floors slippery. If you must use wax, use non-skid floor wax.  Do not have throw rugs and other things on the floor that can make you trip. What can I do with my stairs?  Do not leave any items  on the stairs.  Make sure that there are handrails on both sides of the stairs and use them. Fix handrails that are broken or loose. Make sure that handrails are as long as the stairways.  Check any carpeting to make sure that it is firmly attached to the stairs. Fix any carpet that is loose or worn.  Avoid having throw rugs at the top or bottom of the stairs. If you do have throw rugs, attach them to the floor with carpet tape.  Make sure that you have a light switch at the top of the stairs and the bottom of the stairs. If you do not have them, ask someone to add them for you. What else can I do to help prevent falls?  Wear shoes that:  Do not have high heels.  Have rubber bottoms.  Are comfortable and fit you well.  Are closed at the toe. Do not wear sandals.  If you use a stepladder:  Make sure that it is fully opened. Do not climb a closed stepladder.  Make sure that both sides of the stepladder are locked into place.  Ask someone to hold it for you, if possible.  Clearly mark and make sure that you can see:  Any grab bars or handrails.  First and last steps.  Where the edge of each step is.  Use tools that help you move around (mobility aids) if they are needed. These include:  Canes.  Walkers.  Scooters.  Crutches.  Turn on the lights when you go into a dark area. Replace any light bulbs as soon as they burn out.  Set up your furniture so you have a clear path. Avoid moving your furniture around.  If any of your floors are uneven, fix them.  If there are any pets around you, be aware of where they are.  Review your medicines with your doctor. Some medicines can make you feel dizzy. This can increase your chance of falling. Ask your doctor what other things that you can do to help prevent falls. This information is not intended to replace advice given to you by your health care provider. Make sure you discuss any questions you have with your health care  provider. Document Released: 08/22/2009 Document Revised: 04/02/2016 Document Reviewed: 11/30/2014 Elsevier Interactive Patient Education  2017 [employer].

## 2019-10-25 NOTE — Progress Notes (Signed)
This visit is being conducted via phone call due to the COVID-19 pandemic. This patient has given me verbal consent via phone to conduct this visit, patient states they are participating from their home address. Some vital signs may be absent or patient reported.   Patient identification: identified by name, DOB, and current address.  Location provider: [facility], Office Persons participating in the virtual visit: Audrey Brown and Audrey Forts, LPN.    Subjective:   Audrey Brown is a 73 y.o. female who presents for an Initial Medicare Annual Wellness Visit.  Audrey Brown is living with her daughter and grand-daughter. She is able to ambulate in a power chair. She states she can walk only about 10 feet with assistance. She has fallen multiple times this year.She complains of tremors and chronic lower back and bilateral lower extremity pain. She sees [facility] at [place] annually. They perform FOBT and eye exams on her as well. She states that she can use an exercise bike for a few minutes while her daughter stands nearby.   Review of Systems     Cardiac Risk Factors include: advanced age (>69men, >35 women);sedentary lifestyle     Objective:    Today's Vitals   10/25/19 1004  Weight: 180 lb (81.6 kg)  Height: 5\' 3"  (1.6 m)   Body mass index is 31.89 kg/m.  Advanced Directives 10/25/2019 12/19/2018 11/03/2017 10/29/2017 07/01/2017 04/20/2017 12/01/2016  Does Patient Have a Medical Advance Directive? Yes Yes Yes Yes No Yes Yes  Type of Paramedic of [place];Living will [place];Living will Healthcare Power of [place];Living will [place];Living will  Does patient want to make changes to medical advance directive? No - Patient declined - No - Patient declined - - No - Patient declined No - Patient declined  Copy of [place] in Chart?  No - copy requested Yes - validated most recent copy scanned in chart (See row information) Yes Yes - Yes No - copy requested  Would patient like information on creating a medical advance directive? - - - - - - No - Patient declined    Current Medications (verified) Outpatient Encounter Medications as of 10/25/2019  Medication Sig  . ALPRAZolam (XANAX) 0.25 MG tablet Take 1 tablet (0.25 mg total) by mouth 3 (three) times daily as needed for anxiety.  . AMBULATORY NON FORMULARY MEDICATION Take 10 mg by mouth 3 (three) times daily. Medication Name: Domperidone 10 mg NEEDS APPT FOR FURTHER REFILLS  . buPROPion (WELLBUTRIN XL) 300 MG 24 hr tablet Take 300 mg by mouth daily.   . busPIRone (BUSPAR) 10 MG tablet Take 10 mg by mouth 2 (two) times daily.   . calcium-vitamin D (OSCAL WITH D) 500-200 MG-UNIT tablet Take 1 tablet by mouth 2 (two) times daily.  Marland Kitchen esomeprazole (NEXIUM) 40 MG capsule TAKE 1 CAPSULE TWICE A DAY BEFORE MEALS (Patient taking differently: Take 40 mg by mouth 2 (two) times daily before a meal. )  . ibuprofen (ADVIL) 800 MG tablet Take 1 tablet (800 mg total) by mouth every 8 (eight) hours as needed. for pain  . LamoTRIgine XR (LAMICTAL XR) 200 MG TB24 Take 1 tablet (200 mg total) by mouth daily.  . Magnesium 500 MG CAPS Take 400 mg by mouth at bedtime.   . Melatonin 5 MG CAPS Take 5 mg by mouth at bedtime.  . OSCIMIN 0.125 MG SUBL PLACE 1  TABLET UNDER THE TONGUE EVERY 6 HOURS AS NEEDED FOR CRAMPING (Patient taking differently: Place 0.125 mg under the tongue every 6 (six) hours as needed (cramping). )  . promethazine (PHENERGAN) 25 MG tablet Take 1 tablet (25 mg total) by mouth every 4 (four) hours as needed for nausea.  . propranolol (INDERAL) 10 MG tablet Take 10 mg by mouth 2 (two) times daily.  . temazepam (RESTORIL) 30 MG capsule Take 30 mg by mouth at bedtime.  . traMADol (ULTRAM) 50 MG tablet TAKE 2 TABLETS BY MOUTH EVERY 8 HOURS AS NEEDED FOR MODERATE PAIN (Patient taking  differently: Take 100 mg by mouth every 8 (eight) hours as needed for moderate pain. )  . verapamil (CALAN) 80 MG tablet TAKE 1 TABLET THREE TIMES A DAY (Patient taking differently: Take 80 mg by mouth 3 (three) times daily. )   No facility-administered encounter medications on file as of 10/25/2019.    Allergies (verified) Celebrex [celecoxib], Morphine and related, Penicillins, Risperdal [risperidone], and Statins   History: Past Medical History:  Diagnosis Date  . Arthritis    lower back  . Bipolar disorder ([place])   . Depression   . GERD (gastroesophageal reflux disease)   . Hiatal hernia   . Parkinson disease ([place])   . Poor short-term memory   . Sagittal band rupture at metacarpophalangeal joint 11/2016   right long finger   Past Surgical History:  Procedure Laterality Date  . ANKLE SURGERY    . BALLOON DILATION N/A 10/29/2017   Procedure: BALLOON DILATION;  Surgeon: Jerene Bears, MD;  Location: [facility];  Service: Gastroenterology;  Laterality: N/A;  . CERVICAL [place]  . CESAREAN SECTION    . CHOLECYSTECTOMY    . CYST REMOVAL NECK    . ESOPHAGEAL DILATION  12/19/2018   Procedure: ESOPHAGEAL DILATION;  Surgeon: Jerene Bears, MD;  Location: [facility];  Service: Gastroenterology;;  . ESOPHAGOGASTRODUODENOSCOPY (EGD) WITH ESOPHAGEAL DILATION  07/27/2012   with Propofol  . ESOPHAGOGASTRODUODENOSCOPY (EGD) WITH PROPOFOL N/A 06/23/2016   Procedure: ESOPHAGOGASTRODUODENOSCOPY (EGD) WITH PROPOFOL;  Surgeon: Jerene Bears, MD;  Location: [facility];  Service: Gastroenterology;  Laterality: N/A;  . ESOPHAGOGASTRODUODENOSCOPY (EGD) WITH PROPOFOL N/A 10/29/2017   Procedure: ESOPHAGOGASTRODUODENOSCOPY (EGD) WITH PROPOFOL;  Surgeon: Jerene Bears, MD;  Location: [facility];  Service: Gastroenterology;  Laterality: N/A;  . ESOPHAGOGASTRODUODENOSCOPY (EGD) WITH PROPOFOL N/A 12/19/2018   Procedure: ESOPHAGOGASTRODUODENOSCOPY (EGD) WITH PROPOFOL;  Surgeon: Jerene Bears, MD;  Location: [facility];  Service: Gastroenterology;  Laterality: N/A;  . EYE SURGERY    . EYE SURGERY  09/2019   R eye procedure to relieve pressure  . EYE SURGERY    . FOOT NEUROMA SURGERY Left   . ORIF DISTAL RADIUS FRACTURE Right 07/04/2010  . SAVORY DILATION N/A 06/23/2016   Procedure: SAVORY DILATION;  Surgeon: Jerene Bears, MD;  Location: [facility];  Service: Gastroenterology;  Laterality: N/A;  . TENDON REPAIR Right 12/01/2016   Procedure: right long finger ulnar sagittal band reconstruction;  Surgeon: Leanora Cover, MD;  Location: [facility];  Service: Orthopedics;  Laterality: Right;  . TONSILLECTOMY     Family History  Problem Relation Age of Onset  . Emphysema Mother   . Heart attack Father   . Hypertension Other   . Lung cancer Other    Social History   Socioeconomic History  . Marital status: Widowed    Spouse name: Not on file  . Number of children:  4  . Years of education: Not on file  . Highest education level: Master's degree (e.g., MA, MS, MEng, MEd, MSW, MBA)  Occupational History  . Occupation: Retired    Fish farm manager: RETIRED    Comment: [employer]  Tobacco Use  . Smoking status: Never Smoker  . Smokeless tobacco: Never Used  Substance and Sexual Activity  . Alcohol use: No    Alcohol/week: 0.0 standard drinks  . Drug use: No  . Sexual activity: Not on file  Other Topics Concern  . Not on file  Social History Narrative   Daughter is her POA and must come to all visits in GI and [place]       Patient is right-handed. She lives with her daughter and granddaughter in a split level home with a chair lift. She drinks 3-4 cups of coffee a day.   Social Determinants of Health   Financial Resource Strain:   . Difficulty of Paying Living Expenses: Not on file  Food Insecurity:   . Worried About Charity fundraiser in the Last Year: Not on file  . Ran Out of Food in the Last Year: Not on file  Transportation Needs:   . Lack of Transportation  (Medical): Not on file  . Lack of Transportation (Non-Medical): Not on file  Physical Activity:   . Days of Exercise per Week: Not on file  . Minutes of Exercise per Session: Not on file  Stress:   . Feeling of Stress : Not on file  Social Connections:   . Frequency of Communication with Friends and Family: Not on file  . Frequency of Social Gatherings with Friends and Family: Not on file  . Attends Religious Services: Not on file  . Active Member of Clubs or Organizations: Not on file  . Attends Archivist Meetings: Not on file  . Marital Status: Not on file    Tobacco Counseling Counseling given: Not Answered   Clinical Intake:  Pre-visit preparation completed: Yes        Nutritional Status: BMI > 30  Obese Nutritional Risks: Unintentional weight gain Diabetes: No  How often do you need to have someone help you when you read instructions, pamphlets, or other written materials from your doctor or pharmacy?: 1 - Never What is the last grade level you completed in school?: Master's Degree  Interpreter Needed?: No  Information entered by :: Audrey Forts, LPN.   Activities of Daily Living In your present state of health, do you have any difficulty performing the following activities: 10/25/2019  Hearing? N  Vision? Y  Difficulty concentrating or making decisions? Y  Walking or climbing stairs? Y  Dressing or bathing? Y  Doing errands, shopping? Y  Preparing Food and eating ? N  Using the Toilet? N  In the past six months, have you accidently leaked urine? Y  Do you have problems with loss of bowel control? Y  Managing your Medications? Y  Managing your Finances? Y  Housekeeping or managing your Housekeeping? Y  Some recent data might be hidden     Immunizations and Health Maintenance Immunization History  Administered Date(s) Administered  . Influenza Split 08/02/2013  . Influenza, High Dose Seasonal PF 07/30/2015, 07/20/2017, 08/23/2018,  08/10/2019  . Influenza-Unspecified 08/08/2014, 07/27/2016  . Pneumococcal Conjugate-13 12/25/2015  . Pneumococcal Polysaccharide-23 08/23/2018   Health Maintenance Due  Topic Date Due  . Hepatitis C Screening  1945-12-07  . TETANUS/TDAP  08/04/1967  . COLONOSCOPY  08/03/1998  Patient Care Team: Laurey Morale, MD as PCP - Loman [place], Stephan Minister, DO as Consulting Physician (Neurology)  Indicate any recent Medical Services you may have received from other than [facility] providers in the past year (date may be approximate).     Assessment:   This is a routine wellness examination for [place].  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 Sees eye provider on regular basis. Also has cataracts. Wears glasses.  Dietary issues and exercise activities discussed: Current Exercise Habits: The patient does not participate in regular exercise at present, Exercise limited by: orthopedic condition(s);neurologic condition(s)  Goals   None    Depression Screen PHQ 2/9 Scores 10/25/2019 06/19/2015 03/19/2015 02/28/2015  PHQ - 2 Score 1 0 0 0  PHQ- 9 Score - - 0 -    Fall Risk Fall Risk  10/25/2019 11/08/2018 10/16/2016 06/19/2015 02/28/2015  Falls in the past year? 1 1 Yes Yes No  Comment - - Emmi Telephone Survey: data to providers prior to load - -  Number falls in past yr: 1 1 2  or more 1 -  Comment - - Emmi Telephone Survey Actual Response = 20 - -  Injury with Fall? 1 1 Yes No -  Risk for fall due to : History of fall(s);Impaired vision;Medication side effect;Impaired balance/gait;Impaired mobility - - - -  Follow up Falls evaluation completed;Education provided;Falls prevention discussed  Falls evaluation completed - Falls prevention discussed -    Is the patient's home free of loose throw rugs in walkways, pet beds, electrical cords, etc?   yes      Grab bars in the bathroom? yes      Handrails on the stairs?   yes      Adequate lighting?   yes  Timed Get Up and Go Performed N/A due to telephone visit   Cognitive Function:     6CIT Screen 10/25/2019  What Year? 0 points  What month? 0 points  What time? 0 points  Count back from 20 0 points  Months in reverse 0 points  Repeat phrase 4 points  Total Score 4   Patient states her short term memory is "shot". 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   Screening Tests Health Maintenance  Topic Date Due  . Hepatitis C Screening  09/16/1946  . TETANUS/TDAP  08/04/1967  . COLONOSCOPY  08/03/1998  . MAMMOGRAM  09/03/2021  . INFLUENZA VACCINE  Completed  . DEXA SCAN  Completed  . PNA vac Low Risk Adult  Completed    Qualifies for Shingles Vaccine? Yes, patient instructed to check with pharmacy for cheapest option to obtain Shingrix vaccines.  Cancer Screenings: Lung: Low Dose CT Chest recommended if Age 58-80 years, 30 pack-year currently smoking OR have quit w/in 15years. Patient does not qualify. Breast: Up to date on Mammogram? Yes   Up to date of Bone Density/Dexa? Yes, order sent to [facility] for next one due after 02/02/2020. Colorectal: Patient states the [facility] at [facility] performs FOBT yearly and they have all been negative.  Additional Screenings:  Hepatitis C Screening: defer to next lab draw.     Plan:  Ms. Heninger was scheduled for a telephone visit with PCP today. She is past due for routine labs )including Hep C screening). We discussed obtaining Shingrix and updated Tdap at her pharmacy.   I have personally reviewed and noted the following in the patient's chart:   . Medical and social history . Use of alcohol, tobacco or illicit  drugs  . Current medications and supplements . Functional ability and status . Nutritional status . Physical activity . Advanced directives . List of other physicians . Hospitalizations, surgeries, and ER visits in previous 12 months . Vitals . Screenings to include cognitive, depression, and falls . Referrals and appointments  In addition, I have reviewed and discussed with patient certain preventive protocols, quality metrics, and best practice recommendations. A written personalized care plan for preventive services as well as general preventive health recommendations were provided to patient.     Audrey Forts, LPN   [id number]

## 2019-10-31 ENCOUNTER — Other Ambulatory Visit: Payer: Self-pay | Admitting: Family Medicine

## 2019-10-31 NOTE — Telephone Encounter (Signed)
Please advise Rx is not on current med list. 

## 2019-11-14 ENCOUNTER — Telehealth (INDEPENDENT_AMBULATORY_CARE_PROVIDER_SITE_OTHER): Payer: Medicare Other | Admitting: Family Medicine

## 2019-11-14 ENCOUNTER — Other Ambulatory Visit: Payer: Self-pay

## 2019-11-14 DIAGNOSIS — F3160 Bipolar disorder, current episode mixed, unspecified: Secondary | ICD-10-CM

## 2019-11-14 DIAGNOSIS — G3185 Corticobasal degeneration: Secondary | ICD-10-CM | POA: Diagnosis not present

## 2019-11-14 DIAGNOSIS — R609 Edema, unspecified: Secondary | ICD-10-CM | POA: Diagnosis not present

## 2019-11-14 DIAGNOSIS — M47817 Spondylosis without myelopathy or radiculopathy, lumbosacral region: Secondary | ICD-10-CM

## 2019-11-14 DIAGNOSIS — G20 Parkinson's disease: Secondary | ICD-10-CM

## 2019-11-14 DIAGNOSIS — F0280 Dementia in other diseases classified elsewhere without behavioral disturbance: Secondary | ICD-10-CM

## 2019-11-14 MED ORDER — FUROSEMIDE 20 MG PO TABS: 20.0000 mg | ORAL_TABLET | Freq: Every day | ORAL | 3 refills | Status: DC | PRN

## 2019-11-14 MED ORDER — ALPRAZOLAM 0.25 MG PO TABS: 0.2500 mg | ORAL_TABLET | Freq: Three times a day (TID) | ORAL | 1 refills | Status: DC | PRN

## 2019-11-14 NOTE — Progress Notes (Signed)
Virtual Visit via Telephone Note  I connected with the patient on 11/14/19 at 11:00 AM EST by telephone and verified that I am speaking with the correct person using two identifiers.   I discussed the limitations, risks, security and privacy concerns of performing an evaluation and management service by telephone and the availability of in person appointments. I also discussed with the patient that there may be a patient responsible charge related to this service. The patient expressed understanding and agreed to proceed.  Location patient: home Location provider: work or home office Participants present for the call: patient, provider Patient did not have a visit in the prior 7 days to address this/these issue(s).   History of Present Illness: Here for several issues. First she asks me to refill her Xanax. She deals with constant anxiety, and this helps a great deal. Second for the past month she has had some lower leg swelling that comes and goes. No SOB. Elevating her legs helps. Third she recently moved to a new single level house that is located back off the street. She has a Network engineer at the street but she is physically unable to go out to the box to get her mail. She asks for a letter to the Postmaster to have her mail delivered to her front door.    Observations/Objective: Patient sounds cheerful and well on the phone. I do not appreciate any SOB. Speech and thought processing are grossly intact. Patient reported vitals:  Assessment and Plan: Her anxiety is stable, and we refilled the Xanax. For the leg swelling, she can use Lasix prn. For the mail delivery, we will compose a letter about her disabilities to havwe her mail brought to the front door.  Alysia Penna, MD   Follow Up Instructions:     [phone] [phone] 21-30 I did not refer this patient for an OV in the next 24 hours for this/these issue(s).  I discussed the assessment and treatment plan with the  patient. The patient was provided an opportunity to ask questions and all were answered. The patient agreed with the plan and demonstrated an understanding of the instructions.   The patient was advised to call back or seek an in-person evaluation if the symptoms worsen or if the condition fails to improve as anticipated.  I provided 16 minutes of non-face-to-face time during this encounter.   Alysia Penna, MD

## 2019-12-07 ENCOUNTER — Encounter: Payer: Self-pay | Admitting: Family Medicine

## 2019-12-08 NOTE — Telephone Encounter (Signed)
Tell her to take 1000 mg of vitamin C a day and to see her dentist soon. If this does not help, then we can do lab work

## 2019-12-27 ENCOUNTER — Other Ambulatory Visit: Payer: Self-pay

## 2019-12-27 ENCOUNTER — Telehealth (INDEPENDENT_AMBULATORY_CARE_PROVIDER_SITE_OTHER): Payer: Medicare Other | Admitting: Family Medicine

## 2019-12-27 DIAGNOSIS — J40 Bronchitis, not specified as acute or chronic: Secondary | ICD-10-CM

## 2019-12-27 MED ORDER — HYDROCODONE-HOMATROPINE 5-1.5 MG/5ML PO SYRP: 5.0000 mL | ORAL_SOLUTION | ORAL | 0 refills | Status: DC | PRN

## 2019-12-27 MED ORDER — AZITHROMYCIN 250 MG PO TABS: ORAL_TABLET | ORAL | 0 refills | Status: DC

## 2019-12-27 NOTE — Progress Notes (Signed)
Virtual Visit via Video Note  I connected with the patient on 12/27/19 at 11:00 AM EST by a video enabled telemedicine application and verified that I am speaking with the correct person using two identifiers.  Location patient: home Location provider:work or home office Persons participating in the virtual visit: patient, provider  I discussed the limitations of evaluation and management by telemedicine and the availability of in person appointments. The patient expressed understanding and agreed to proceed.   HPI: Here for 5 days of a deep but non-productive cough with chest congestion, a runny nose, and a headache. No fever or body aches. No loss of taste or smell. No SOB or chest pain. No NVD. She is drinking fluids and taking Ibuprofen.    ROS: See pertinent positives and negatives per HPI.  Past Medical History:  Diagnosis Date  . Arthritis    lower back  . Bipolar disorder ([place])   . Depression   . GERD (gastroesophageal reflux disease)   . Hiatal hernia   . Parkinson disease ([place])   . Poor short-term memory   . Sagittal band rupture at metacarpophalangeal joint 11/2016   right long finger    Past Surgical History:  Procedure Laterality Date  . ANKLE SURGERY    . BALLOON DILATION N/A 10/29/2017   Procedure: BALLOON DILATION;  Surgeon: Jerene Bears, MD;  Location: [facility];  Service: Gastroenterology;  Laterality: N/A;  . CERVICAL [place]  . CESAREAN SECTION    . CHOLECYSTECTOMY    . CYST REMOVAL NECK    . ESOPHAGEAL DILATION  12/19/2018   Procedure: ESOPHAGEAL DILATION;  Surgeon: Jerene Bears, MD;  Location: [facility];  Service: Gastroenterology;;  . ESOPHAGOGASTRODUODENOSCOPY (EGD) WITH ESOPHAGEAL DILATION  07/27/2012   with Propofol  . ESOPHAGOGASTRODUODENOSCOPY (EGD) WITH PROPOFOL N/A 06/23/2016   Procedure: ESOPHAGOGASTRODUODENOSCOPY (EGD) WITH PROPOFOL;  Surgeon: Jerene Bears, MD;  Location: [facility];  Service: Gastroenterology;   Laterality: N/A;  . ESOPHAGOGASTRODUODENOSCOPY (EGD) WITH PROPOFOL N/A 10/29/2017   Procedure: ESOPHAGOGASTRODUODENOSCOPY (EGD) WITH PROPOFOL;  Surgeon: Jerene Bears, MD;  Location: [facility];  Service: Gastroenterology;  Laterality: N/A;  . ESOPHAGOGASTRODUODENOSCOPY (EGD) WITH PROPOFOL N/A 12/19/2018   Procedure: ESOPHAGOGASTRODUODENOSCOPY (EGD) WITH PROPOFOL;  Surgeon: Jerene Bears, MD;  Location: [facility];  Service: Gastroenterology;  Laterality: N/A;  . EYE SURGERY    . EYE SURGERY  09/2019   R eye procedure to relieve pressure  . EYE SURGERY    . FOOT NEUROMA SURGERY Left   . ORIF DISTAL RADIUS FRACTURE Right 07/04/2010  . SAVORY DILATION N/A 06/23/2016   Procedure: SAVORY DILATION;  Surgeon: Jerene Bears, MD;  Location: [facility];  Service: Gastroenterology;  Laterality: N/A;  . TENDON REPAIR Right 12/01/2016   Procedure: right long finger ulnar sagittal band reconstruction;  Surgeon: Leanora Cover, MD;  Location: [facility];  Service: Orthopedics;  Laterality: Right;  . TONSILLECTOMY      Family History  Problem Relation Age of Onset  . Emphysema Mother   . Heart attack Father   . Hypertension Other   . Lung cancer Other      Current Outpatient Medications:  .  ALPRAZolam (XANAX) 0.25 MG tablet, Take 1 tablet (0.25 mg total) by mouth 3 (three) times daily as needed for anxiety., Disp: 270 tablet, Rfl: 1 .  AMBULATORY NON FORMULARY MEDICATION, Take 10 mg by mouth 3 (three) times daily. Medication Name: Domperidone 10 mg NEEDS APPT FOR FURTHER REFILLS, Disp:  360 tablet, Rfl: 0 .  azithromycin (ZITHROMAX) 250 MG tablet, As directed, Disp: 6 tablet, Rfl: 0 .  buPROPion (WELLBUTRIN XL) 300 MG 24 hr tablet, Take 300 mg by mouth daily. , Disp: , Rfl:  .  busPIRone (BUSPAR) 10 MG tablet, Take 10 mg by mouth 2 (two) times daily. , Disp: , Rfl:  .  Calcium Carb-Cholecalciferol 500-100 MG-UNIT CHEW, Chew by mouth., Disp: , Rfl:  .  calcium-vitamin D (OSCAL WITH D)  500-200 MG-UNIT tablet, Take 1 tablet by mouth 2 (two) times daily., Disp: , Rfl:  .  cyclobenzaprine (FLEXERIL) 10 MG tablet, TAKE 1 TABLET THREE TIMES A DAY AS NEEDED FOR MUSCLE SPASMS, Disp: 270 tablet, Rfl: 3 .  esomeprazole (NEXIUM) 40 MG capsule, TAKE 1 CAPSULE TWICE A DAY BEFORE MEALS (Patient taking differently: Take 40 mg by mouth 2 (two) times daily before a meal. ), Disp: 180 capsule, Rfl: 3 .  furosemide (LASIX) 20 MG tablet, Take 1 tablet (20 mg total) by mouth daily as needed for fluid., Disp: 90 tablet, Rfl: 3 .  HYDROcodone-homatropine (HYDROMET) 5-1.5 MG/5ML syrup, Take 5 mLs by mouth every 4 (four) hours as needed., Disp: 240 mL, Rfl: 0 .  ibuprofen (ADVIL) 800 MG tablet, Take 1 tablet (800 mg total) by mouth every 8 (eight) hours as needed. for pain, Disp: 270 tablet, Rfl: 3 .  LamoTRIgine XR (LAMICTAL XR) 200 MG TB24, Take 1 tablet (200 mg total) by mouth daily., Disp: 90 tablet, Rfl: 1 .  Magnesium 500 MG CAPS, Take 400 mg by mouth at bedtime. , Disp: , Rfl:  .  Melatonin 5 MG CAPS, Take 5 mg by mouth at bedtime., Disp: , Rfl:  .  OSCIMIN 0.125 MG SUBL, PLACE 1 TABLET UNDER THE TONGUE EVERY 6 HOURS AS NEEDED FOR CRAMPING (Patient taking differently: Place 0.125 mg under the tongue every 6 (six) hours as needed (cramping). ), Disp: 360 each, Rfl: 0 .  promethazine (PHENERGAN) 25 MG tablet, Take 1 tablet (25 mg total) by mouth every 4 (four) hours as needed for nausea., Disp: 180 tablet, Rfl: 2 .  propranolol (INDERAL) 10 MG tablet, Take 10 mg by mouth 2 (two) times daily., Disp: , Rfl:  .  temazepam (RESTORIL) 30 MG capsule, Take 30 mg by mouth at bedtime., Disp: , Rfl:  .  traMADol (ULTRAM) 50 MG tablet, TAKE 2 TABLETS BY MOUTH EVERY 8 HOURS AS NEEDED FOR MODERATE PAIN (Patient taking differently: Take 100 mg by mouth every 8 (eight) hours as needed for moderate pain. ), Disp: 60 tablet, Rfl: 5 .  verapamil (CALAN) 80 MG tablet, TAKE 1 TABLET THREE TIMES A DAY (Patient taking  differently: Take 80 mg by mouth 3 (three) times daily. ), Disp: 270 tablet, Rfl: 3  EXAM:  VITALS per patient if applicable:  GENERAL: alert, oriented, appears well and in no acute distress  HEENT: atraumatic, conjunttiva clear, no obvious abnormalities on inspection of external nose and ears  NECK: normal movements of the head and neck  LUNGS: on inspection no signs of respiratory distress, breathing rate appears normal, no obvious gross SOB, gasping or wheezing  CV: no obvious cyanosis  MS: moves all visible extremities without noticeable abnormality  PSYCH/NEURO: pleasant and cooperative, no obvious depression or anxiety, speech and thought processing grossly intact  ASSESSMENT AND PLAN: Bronchitis, treat with a Zpack. Use Hydromet as needed.  Alysia Penna, MD  Discussed the following assessment and plan:  No diagnosis found.  I discussed the assessment and treatment plan with the patient. The patient was provided an opportunity to ask questions and all were answered. The patient agreed with the plan and demonstrated an understanding of the instructions.   The patient was advised to call back or seek an in-person evaluation if the symptoms worsen or if the condition fails to improve as anticipated.

## 2020-01-03 DIAGNOSIS — H2513 Age-related nuclear cataract, bilateral: Secondary | ICD-10-CM | POA: Diagnosis not present

## 2020-01-03 DIAGNOSIS — H40211 Acute angle-closure glaucoma, right eye: Secondary | ICD-10-CM | POA: Diagnosis not present

## 2020-01-03 DIAGNOSIS — H40031 Anatomical narrow angle, right eye: Secondary | ICD-10-CM | POA: Diagnosis not present

## 2020-01-03 DIAGNOSIS — H4020X4 Unspecified primary angle-closure glaucoma, indeterminate stage: Secondary | ICD-10-CM | POA: Diagnosis not present

## 2020-01-24 ENCOUNTER — Telehealth: Payer: Self-pay | Admitting: Family Medicine

## 2020-01-24 MED ORDER — DOXYCYCLINE HYCLATE 100 MG PO CAPS: 100.0000 mg | ORAL_CAPSULE | Freq: Two times a day (BID) | ORAL | 0 refills | Status: AC

## 2020-01-24 MED ORDER — HYDROCODONE-HOMATROPINE 5-1.5 MG/5ML PO SYRP: 5.0000 mL | ORAL_SOLUTION | ORAL | 0 refills | Status: DC | PRN

## 2020-01-24 NOTE — Telephone Encounter (Signed)
I sent in Doxycycline and more Hydromet

## 2020-01-24 NOTE — Telephone Encounter (Signed)
Please advise. You seen patient on 12/2019 and rx'd hydromet.

## 2020-01-24 NOTE — Telephone Encounter (Signed)
Called patient and let her know Dr.Fry sent in Doxycycline and more Hydromet. Patient verbalized understanding.

## 2020-01-24 NOTE — Telephone Encounter (Signed)
Pt stated that her bronchitis is returning with a terrible cough a lot of flem coming up. She would like to know if Sarajane Jews can call her in an antibiotic   Pharmacy: [facility] [place]: [phone]   Pt can be reached at [phone]   Pt also wanted to let her PCP know that she does no answer any calls she does not know and if calling for the doctor it must be from a business line not a private number. Pt stated that she received a call about an appt-no appt on her chart for today- but she did not answer so that is why she wants to make her PCP aware.

## 2020-02-12 DIAGNOSIS — H2513 Age-related nuclear cataract, bilateral: Secondary | ICD-10-CM | POA: Diagnosis not present

## 2020-02-12 DIAGNOSIS — H04123 Dry eye syndrome of bilateral lacrimal glands: Secondary | ICD-10-CM | POA: Diagnosis not present

## 2020-02-12 DIAGNOSIS — H40032 Anatomical narrow angle, left eye: Secondary | ICD-10-CM | POA: Diagnosis not present

## 2020-02-12 DIAGNOSIS — H02886 Meibomian gland dysfunction of left eye, unspecified eyelid: Secondary | ICD-10-CM | POA: Diagnosis not present

## 2020-02-12 DIAGNOSIS — H0288A Meibomian gland dysfunction right eye, upper and lower eyelids: Secondary | ICD-10-CM | POA: Diagnosis not present

## 2020-02-12 DIAGNOSIS — H0288B Meibomian gland dysfunction left eye, upper and lower eyelids: Secondary | ICD-10-CM | POA: Diagnosis not present

## 2020-02-12 DIAGNOSIS — H02883 Meibomian gland dysfunction of right eye, unspecified eyelid: Secondary | ICD-10-CM | POA: Diagnosis not present

## 2020-02-12 DIAGNOSIS — H4020X0 Unspecified primary angle-closure glaucoma, stage unspecified: Secondary | ICD-10-CM | POA: Diagnosis not present

## 2020-02-12 DIAGNOSIS — H40211 Acute angle-closure glaucoma, right eye: Secondary | ICD-10-CM | POA: Diagnosis not present

## 2020-03-04 NOTE — Telephone Encounter (Signed)
Pt's daughter, Langley Gauss (ok per DPR) stated her mother still has a cough and seems wetter. She also is cold all the time and fatigue. They would like to know what their next steps are because this has been going on for 2 months now.    Langley Gauss can be reached at [phone]

## 2020-03-05 NOTE — Telephone Encounter (Signed)
Set up an in person OV so I can listen to her and maybe get a CXR

## 2020-03-05 NOTE — Telephone Encounter (Signed)
Left message for patient to call back  

## 2020-03-11 ENCOUNTER — Other Ambulatory Visit: Payer: Self-pay | Admitting: Family Medicine

## 2020-03-15 ENCOUNTER — Telehealth: Payer: Self-pay | Admitting: Family Medicine

## 2020-03-15 MED ORDER — HYDROCODONE-HOMATROPINE 5-1.5 MG/5ML PO SYRP: 5.0000 mL | ORAL_SOLUTION | ORAL | 0 refills | Status: DC | PRN

## 2020-03-15 NOTE — Telephone Encounter (Signed)
Left a detailed message on verified voice mail.   

## 2020-03-15 NOTE — Telephone Encounter (Signed)
Pt is requesting a cough syrup that Dr. Sarajane Jews, prescribed about 6 weeks ago. Pt has a cough again and would like a refill on the medication because it did help with the cough. Pt threw away the bottle and does not know the name of the cough syrup. Pt uses [facility] representative on [address]. Thanks

## 2020-03-15 NOTE — Telephone Encounter (Signed)
Done

## 2020-04-02 ENCOUNTER — Other Ambulatory Visit: Payer: Self-pay | Admitting: Family Medicine

## 2020-04-05 DIAGNOSIS — H40211 Acute angle-closure glaucoma, right eye: Secondary | ICD-10-CM | POA: Diagnosis not present

## 2020-04-05 DIAGNOSIS — H04123 Dry eye syndrome of bilateral lacrimal glands: Secondary | ICD-10-CM | POA: Diagnosis not present

## 2020-04-05 DIAGNOSIS — H0288A Meibomian gland dysfunction right eye, upper and lower eyelids: Secondary | ICD-10-CM | POA: Diagnosis not present

## 2020-04-05 DIAGNOSIS — H25813 Combined forms of age-related cataract, bilateral: Secondary | ICD-10-CM | POA: Diagnosis not present

## 2020-04-05 DIAGNOSIS — H4020X3 Unspecified primary angle-closure glaucoma, severe stage: Secondary | ICD-10-CM | POA: Diagnosis not present

## 2020-04-05 DIAGNOSIS — H0288B Meibomian gland dysfunction left eye, upper and lower eyelids: Secondary | ICD-10-CM | POA: Diagnosis not present

## 2020-04-13 ENCOUNTER — Other Ambulatory Visit: Payer: Self-pay | Admitting: Neurology

## 2020-05-03 ENCOUNTER — Encounter: Payer: Self-pay | Admitting: Internal Medicine

## 2020-05-03 ENCOUNTER — Ambulatory Visit (INDEPENDENT_AMBULATORY_CARE_PROVIDER_SITE_OTHER): Payer: Medicare Other | Admitting: Internal Medicine

## 2020-05-03 VITALS — BP 132/88 | HR 80 | Ht 62.0 in | Wt 203.4 lb

## 2020-05-03 DIAGNOSIS — K219 Gastro-esophageal reflux disease without esophagitis: Secondary | ICD-10-CM

## 2020-05-03 DIAGNOSIS — K3184 Gastroparesis: Secondary | ICD-10-CM | POA: Diagnosis not present

## 2020-05-03 DIAGNOSIS — R1310 Dysphagia, unspecified: Secondary | ICD-10-CM

## 2020-05-03 DIAGNOSIS — R195 Other fecal abnormalities: Secondary | ICD-10-CM

## 2020-05-03 DIAGNOSIS — R1319 Other dysphagia: Secondary | ICD-10-CM

## 2020-05-03 MED ORDER — ESOMEPRAZOLE MAGNESIUM 40 MG PO CPDR: 40.0000 mg | DELAYED_RELEASE_CAPSULE | Freq: Every day | ORAL | 0 refills | Status: DC

## 2020-05-03 MED ORDER — ESOMEPRAZOLE MAGNESIUM 40 MG PO CPDR: 40.0000 mg | DELAYED_RELEASE_CAPSULE | Freq: Every day | ORAL | 1 refills | Status: DC

## 2020-05-03 MED ORDER — AMBULATORY NON FORMULARY MEDICATION: 10.0000 mg | Freq: Three times a day (TID) | 1 refills | Status: DC

## 2020-05-03 NOTE — Progress Notes (Signed)
° °  Subjective:    Patient ID: Audrey Brown, female    DOB: 09/08/1946, 74 y.o.   MRN: [id number]  HPI Audrey Brown is a 74 year old female with a history of GERD, hiatal hernia, esophageal dysphagia responsive to esophageal dilation, gastroparesis, IBS, history of Parkinson's disease who is here for follow-up.  She is here today with her daughter.  She was last seen in the time of her last upper endoscopy performed on 12/19/2018.  This revealed normal esophagus with a 2 cm hiatal hernia.  Dilation was performed with 18 mm balloon.  The stomach and small bowel were normal.  She reports that she has been having recurrent dysphagia symptoms.  She is having very rare heartburn symptom.  She uses domperidone 3 times a day which helps significantly with nausea and abdominal bloating and upper abdominal pain.  Her appetite has been decreased somewhat.  She is noticed that she will have a normal bowel movement in the morning but then after lunch seem to have a loose and urgent bowel movement which can be difficult to control.  No blood in stool or melena.  She is using Nexium 40 mg twice daily.  Domperidone 10 mg 3 times daily AC as above.  She reports she had annual physical with the [place] and had negative FOBT testing earlier this year.  She had a negative Cologuard in 2018   Review of Systems As per HPI, otherwise negative  Current Medications, Allergies, Past Medical History, Past Surgical History, Family History and Social History were reviewed in [employer] record.     Objective:   Physical Exam BP 132/88 (BP Location: Left Arm, Patient Position: Sitting, Cuff Size: Normal)    Pulse 80    Ht 5\' 2"  (1.575 m)    Wt 203 lb 6 oz (92.3 kg)    BMI 37.20 kg/m   Gen: awake, alert, NAD HEENT: anicteric CV: RRR, no mrg Pulm: CTA b/l Abd: soft, nontender, +BS throughout Ext: no c/c/e       Assessment & Plan:  74 year old female with a history of GERD, hiatal hernia,  esophageal dysphagia responsive to esophageal dilation, gastroparesis, IBS, history of Parkinson's disease who is here for follow-up.  1. GERD/esophageal dysphagia --she is not having much heartburn symptom but has had recurrent dysphagia which seems to always improve with esophageal dilation.  She would like to proceed with repeat esophageal dilation.  I think we can likely decrease her PPI.  We did discuss chronic PPI therapy including the risk, benefits and alternatives. --Upper endoscopy with balloon dilation of the LEC --Reduce Nexium to 40 mg once daily before breakfast --She is asked to notify me if heartburn or reflux symptoms worsen when reducing PPI  2.  Gastroparesis --improved with domperidone which we will continue 10 mg 3 times daily before meals.  3.  Intermittent loose stools --possibly worsened by domperidone but this has helped tremendously with gastroparesis symptoms.  Begin Benefiber 1 working to 2 tablespoons daily  4.  CRC screening --negative Cologuard in 2018, she reports negative FOBT testing earlier this year by primary care but she is up-to-date with screening.  I would recommend consideration of Cologuard next year when she is seen by primary care rather than FOBT or FIT.

## 2020-05-03 NOTE — Patient Instructions (Signed)
You have been scheduled for an endoscopy. Please follow written instructions given to you at your visit today. If you use inhalers (even only as needed), please bring them with you on the day of your procedure. Your physician has requested that you go to www.startemmi.com and enter the access code given to you at your visit today. This web site gives a general overview about your procedure. However, you should still follow specific instructions given to you by our office regarding your preparation for the procedure.  We have sent the following medications to your pharmacy for you to pick up at your convenience: Nexium 40 mg once daily (decrease from previous dosing)  Please purchase the following medications over the counter and take as directed: Benefiber 1-2 heaping teaspoons daily

## 2020-05-08 ENCOUNTER — Ambulatory Visit (AMBULATORY_SURGERY_CENTER): Payer: Medicare Other | Admitting: Internal Medicine

## 2020-05-08 ENCOUNTER — Encounter: Payer: Self-pay | Admitting: Internal Medicine

## 2020-05-08 ENCOUNTER — Other Ambulatory Visit: Payer: Self-pay

## 2020-05-08 VITALS — BP 172/91 | HR 74 | Temp 97.1°F | Resp 14 | Ht 62.0 in | Wt 203.0 lb

## 2020-05-08 DIAGNOSIS — K449 Diaphragmatic hernia without obstruction or gangrene: Secondary | ICD-10-CM | POA: Diagnosis not present

## 2020-05-08 DIAGNOSIS — R1310 Dysphagia, unspecified: Secondary | ICD-10-CM

## 2020-05-08 DIAGNOSIS — R1319 Other dysphagia: Secondary | ICD-10-CM

## 2020-05-08 MED ORDER — SODIUM CHLORIDE 0.9 % IV SOLN: 500.0000 mL | Freq: Once | INTRAVENOUS | Status: DC

## 2020-05-08 NOTE — Op Note (Signed)
[facility] Patient Name: Audrey Brown Procedure Date: 05/08/2020 9:45 AM MRN: [id number] Endoscopist: Jerene Bears , MD Age: 74 Referring MD:  Date of Birth: 1945/12/13 Gender: Female Account #: [id number] Procedure:                Upper GI endoscopy Indications:              Dysphagia Medicines:                Monitored Anesthesia Care Procedure:                Pre-Anesthesia Assessment:                           - Prior to the procedure, a History and Physical                            was performed, and patient medications and                            allergies were reviewed. The patient's tolerance of                            previous anesthesia was also reviewed. The risks                            and benefits of the procedure and the sedation                            options and risks were discussed with the patient.                            All questions were answered, and informed consent                            was obtained. Prior Anticoagulants: The patient has                            taken no previous anticoagulant or antiplatelet                            agents. ASA Grade Assessment: III - A patient with                            severe systemic disease. After reviewing the risks                            and benefits, the patient was deemed in                            satisfactory condition to undergo the procedure.                           After obtaining informed consent, the endoscope was  passed under direct vision. Throughout the                            procedure, the patient's blood pressure, pulse, and                            oxygen saturations were monitored continuously. The                            Endoscope was introduced through the mouth, and                            advanced to the second part of duodenum. The upper                            GI endoscopy was accomplished without  difficulty.                            The patient tolerated the procedure well. Scope In: Scope Out: Findings:                 No endoscopic abnormality was evident in the                            esophagus to explain the patient's complaint of                            dysphagia. It was decided, however, to proceed with                            dilation of the entire esophagus. A TTS dilator was                            passed through the scope. Dilation with a 16-17-18                            mm balloon dilator was performed to 18 mm. The                            inflated 18 mm balloon was held in the distal                            esophageal across the GE junction for 60 seconds                            and then gently pulled into the proximal esophagus.                           A 2 cm hiatal hernia was present.                           The entire examined stomach was normal.  The examined duodenum was normal. Complications:            No immediate complications. Estimated Blood Loss:     Estimated blood loss: none. Impression:               - No endoscopic esophageal abnormality to explain                            patient's dysphagia. Esophagus dilated to 18 mm                            with balloon.                           - Small hiatal hernia.                           - Normal stomach.                           - Normal examined duodenum.                           - No specimens collected. Recommendation:           - Patient has a contact number available for                            emergencies. The signs and symptoms of potential                            delayed complications were discussed with the                            patient. Return to normal activities tomorrow.                            Written discharge instructions were provided to the                            patient.                           - Resume  previous diet.                           - Continue present medications. Jerene Bears, MD 05/08/2020 10:06:32 AM This report has been signed electronically.

## 2020-05-08 NOTE — Progress Notes (Signed)
To PACU, VSS. Report to Rn.tb Physician aware of additional meds.tb

## 2020-05-08 NOTE — Patient Instructions (Signed)
Discharge instructions given. Handouts on hiatal hernia and a dilatation diet. Resume previous medications. YOU HAD AN ENDOSCOPIC PROCEDURE TODAY AT [facility]:   Refer to the procedure report that was given to you for any specific questions about what was found during the examination.  If the procedure report does not answer your questions, please call your gastroenterologist to clarify.  If you requested that your care partner not be given the details of your procedure findings, then the procedure report has been included in a sealed envelope for you to review at your convenience later.  YOU SHOULD EXPECT: Some feelings of bloating in the abdomen. Passage of more gas than usual.  Walking can help get rid of the air that was put into your GI tract during the procedure and reduce the bloating. If you had a lower endoscopy (such as a colonoscopy or flexible sigmoidoscopy) you may notice spotting of blood in your stool or on the toilet paper. If you underwent a bowel prep for your procedure, you may not have a normal bowel movement for a few days.  Please Note:  You might notice some irritation and congestion in your nose or some drainage.  This is from the oxygen used during your procedure.  There is no need for concern and it should clear up in a day or so.  SYMPTOMS TO REPORT IMMEDIATELY:    Following upper endoscopy (EGD)  Vomiting of blood or coffee ground material  New chest pain or pain under the shoulder blades  Painful or persistently difficult swallowing  New shortness of breath  Fever of 100F or higher  Black, tarry-looking stools  For urgent or emergent issues, a gastroenterologist can be reached at any hour by calling [phone]. Do not use MyChart messaging for urgent concerns.    DIET:  We do recommend a small meal at first, but then you may proceed to your regular diet.  Drink plenty of fluids but you should avoid alcoholic beverages for 24  hours.  ACTIVITY:  You should plan to take it easy for the rest of today and you should NOT DRIVE or use heavy machinery until tomorrow (because of the sedation medicines used during the test).    FOLLOW UP: Our staff will call the number listed on your records 48-72 hours following your procedure to check on you and address any questions or concerns that you may have regarding the information given to you following your procedure. If we do not reach you, we will leave a message.  We will attempt to reach you two times.  During this call, we will ask if you have developed any symptoms of COVID 19. If you develop any symptoms (ie: fever, flu-like symptoms, shortness of breath, cough etc.) before then, please call [phone].  If you test positive for Covid 19 in the 2 weeks post procedure, please call and report this information to [place].    If any biopsies were taken you will be contacted by phone or by letter within the next 1-3 weeks.  Please call us at [phone] if you have not heard about the biopsies in 3 weeks.    SIGNATURES/CONFIDENTIALITY: You and/or your care partner have signed paperwork which will be entered into your electronic medical record.  These signatures attest to the fact that that the information above on your After Visit Summary has been reviewed and is understood.  Full responsibility of the confidentiality of this discharge information lies with you and/or your  care-partner.

## 2020-05-08 NOTE — Progress Notes (Signed)
Called to room to assist during endoscopic procedure.  Patient ID and intended procedure confirmed with present staff. Received instructions for my participation in the procedure from the performing physician.  

## 2020-05-08 NOTE — Progress Notes (Signed)
Pt's states no medical or surgical changes since previsit or office visit.  CW - vitals 

## 2020-05-10 ENCOUNTER — Telehealth: Payer: Self-pay

## 2020-05-10 NOTE — Telephone Encounter (Signed)
Covid-19 screening questions   Do you now or have you had a fever in the last 14 days? No.  Do you have any respiratory symptoms of shortness of breath or cough now or in the last 14 days? No.  Do you have any family members or close contacts with diagnosed or suspected Covid-19 in the past 14 days? No.  Have you been tested for Covid-19 and found to be positive? No.       Follow up Call-  Call back number 05/08/2020 12/30/2017  Post procedure Call Back phone  # [phone]  Permission to leave phone message Yes -  Some recent data might be hidden     Patient questions:  Do you have a fever, pain , or abdominal swelling? No. Pain Score  0 *  Have you tolerated food without any problems? Yes.    Have you been able to return to your normal activities? Yes.    Do you have any questions about your discharge instructions: Diet   No. Medications  No. Follow up visit  No.  Do you have questions or concerns about your Care? No.  Actions: * If pain score is 4 or above: Physician/ provider Notified :

## 2020-06-12 ENCOUNTER — Telehealth: Payer: Self-pay | Admitting: Family Medicine

## 2020-06-12 MED ORDER — ALPRAZOLAM 0.25 MG PO TABS: 0.2500 mg | ORAL_TABLET | Freq: Three times a day (TID) | ORAL | 1 refills | Status: DC | PRN

## 2020-06-12 MED ORDER — ESZOPICLONE 1 MG PO TABS: 1.0000 mg | ORAL_TABLET | Freq: Every evening | ORAL | 1 refills | Status: DC | PRN

## 2020-06-12 NOTE — Telephone Encounter (Signed)
Last filled 11/14/2019, Audrey Brown is not on the patients med list.  Last OV 12/27/2019 Ok to fill?

## 2020-06-12 NOTE — Telephone Encounter (Signed)
Spoke with the patient. She is aware her medications have been sent in.

## 2020-06-12 NOTE — Telephone Encounter (Signed)
Done

## 2020-06-12 NOTE — Telephone Encounter (Signed)
Pt is calling in needing a refill on Rx's alprazolam (XANAX) 0.25 MG and eszopiclone (LUNESTA) 1 MG   Pharm:  [facility]

## 2020-06-14 DIAGNOSIS — H35371 Puckering of macula, right eye: Secondary | ICD-10-CM | POA: Diagnosis not present

## 2020-06-14 DIAGNOSIS — H0288B Meibomian gland dysfunction left eye, upper and lower eyelids: Secondary | ICD-10-CM | POA: Diagnosis not present

## 2020-06-14 DIAGNOSIS — H0288A Meibomian gland dysfunction right eye, upper and lower eyelids: Secondary | ICD-10-CM | POA: Diagnosis not present

## 2020-06-14 DIAGNOSIS — H02886 Meibomian gland dysfunction of left eye, unspecified eyelid: Secondary | ICD-10-CM | POA: Diagnosis not present

## 2020-06-14 DIAGNOSIS — H02883 Meibomian gland dysfunction of right eye, unspecified eyelid: Secondary | ICD-10-CM | POA: Diagnosis not present

## 2020-06-14 DIAGNOSIS — H25811 Combined forms of age-related cataract, right eye: Secondary | ICD-10-CM | POA: Diagnosis not present

## 2020-06-14 DIAGNOSIS — H40211 Acute angle-closure glaucoma, right eye: Secondary | ICD-10-CM | POA: Diagnosis not present

## 2020-06-14 DIAGNOSIS — H04123 Dry eye syndrome of bilateral lacrimal glands: Secondary | ICD-10-CM | POA: Diagnosis not present

## 2020-06-14 DIAGNOSIS — H2512 Age-related nuclear cataract, left eye: Secondary | ICD-10-CM | POA: Diagnosis not present

## 2020-06-14 DIAGNOSIS — H4020X3 Unspecified primary angle-closure glaucoma, severe stage: Secondary | ICD-10-CM | POA: Diagnosis not present

## 2020-06-14 DIAGNOSIS — H40032 Anatomical narrow angle, left eye: Secondary | ICD-10-CM | POA: Diagnosis not present

## 2020-07-07 ENCOUNTER — Other Ambulatory Visit: Payer: Self-pay | Admitting: Family Medicine

## 2020-07-08 NOTE — Telephone Encounter (Signed)
Last filled 08/08/2019 Last OV 12/27/2019  Ok to fill?

## 2020-08-05 ENCOUNTER — Other Ambulatory Visit: Payer: Self-pay | Admitting: Family Medicine

## 2020-08-05 DIAGNOSIS — Z1231 Encounter for screening mammogram for malignant neoplasm of breast: Secondary | ICD-10-CM

## 2020-08-07 DIAGNOSIS — E039 Hypothyroidism, unspecified: Secondary | ICD-10-CM | POA: Diagnosis not present

## 2020-08-07 DIAGNOSIS — H25811 Combined forms of age-related cataract, right eye: Secondary | ICD-10-CM | POA: Diagnosis not present

## 2020-08-07 DIAGNOSIS — H2511 Age-related nuclear cataract, right eye: Secondary | ICD-10-CM | POA: Diagnosis not present

## 2020-08-07 HISTORY — PX: CATARACT EXTRACTION: SUR2

## 2020-08-19 ENCOUNTER — Ambulatory Visit (INDEPENDENT_AMBULATORY_CARE_PROVIDER_SITE_OTHER): Payer: Medicare Other | Admitting: Family Medicine

## 2020-08-19 ENCOUNTER — Other Ambulatory Visit: Payer: Self-pay

## 2020-08-19 ENCOUNTER — Encounter: Payer: Self-pay | Admitting: Family Medicine

## 2020-08-19 VITALS — BP 110/68 | HR 76 | Temp 97.9°F | Ht 62.0 in | Wt 180.0 lb

## 2020-08-19 DIAGNOSIS — M545 Low back pain, unspecified: Secondary | ICD-10-CM

## 2020-08-19 MED ORDER — METHYLPREDNISOLONE ACETATE 80 MG/ML IJ SUSP
80.0000 mg | Freq: Once | INTRAMUSCULAR | Status: AC
  Administered 2020-08-19: 80 mg via INTRAMUSCULAR

## 2020-08-19 MED ORDER — METHYLPREDNISOLONE ACETATE 40 MG/ML IJ SUSP
40.0000 mg | Freq: Once | INTRAMUSCULAR | Status: AC
  Administered 2020-08-19: 40 mg via INTRAMUSCULAR

## 2020-08-19 NOTE — Addendum Note (Signed)
Addended by: Matilde Sprang on: 08/19/2020 04:00 PM   Modules accepted: Orders

## 2020-08-19 NOTE — Progress Notes (Signed)
   Subjective:    Patient ID: Audrey Brown, female    DOB: 02-27-1946, 74 y.o.   MRN: [id number]  HPI Here for the sudden onset yesterday of severe pain in the lower back that radiates down the right leg. No recent trauma, but she is not used to standing up. Yesterday she was standing and holding onto her kitchen counter to make coffee, and she was only up for about 2-3 minutes. The pain started then before she could sit down. She has used heat and she has been taking Tramadol and Flexeril with no relief from the pain. It feels okay if she is perfectly still, but she has pain any time she moves around. Her last lumbar MRI in 2019 showed a bulging disc at L1-2.    Review of Systems  Constitutional: Negative.   Respiratory: Negative.   Cardiovascular: Negative.   Gastrointestinal: Negative.   Genitourinary: Negative.   Musculoskeletal: Positive for back pain.       Objective:   Physical Exam Constitutional:      Comments: In her wheelchair   Cardiovascular:     Rate and Rhythm: Normal rate and regular rhythm.     Pulses: Normal pulses.     Heart sounds: Normal heart sounds.  Pulmonary:     Effort: Pulmonary effort is normal.     Breath sounds: Normal breath sounds.  Musculoskeletal:     Comments: She is very tender over the lower spine and over the right sciatic notch. ROM is very limited due to pain   Neurological:     Mental Status: She is alert.           Assessment & Plan:  Lumbar pain, we will give her a shot of DepoMedrol to reduce inflammation. She will follow this with a Medrol dose pack. Add Tramadol and Flexeril. Recheck prn. Alysia Penna, MD

## 2020-08-20 ENCOUNTER — Telehealth: Payer: Self-pay | Admitting: Family Medicine

## 2020-08-20 DIAGNOSIS — H40211 Acute angle-closure glaucoma, right eye: Secondary | ICD-10-CM | POA: Diagnosis not present

## 2020-08-20 DIAGNOSIS — Z48810 Encounter for surgical aftercare following surgery on the sense organs: Secondary | ICD-10-CM | POA: Diagnosis not present

## 2020-08-20 DIAGNOSIS — H40032 Anatomical narrow angle, left eye: Secondary | ICD-10-CM | POA: Diagnosis not present

## 2020-08-20 DIAGNOSIS — Z961 Presence of intraocular lens: Secondary | ICD-10-CM | POA: Diagnosis not present

## 2020-08-20 DIAGNOSIS — H35371 Puckering of macula, right eye: Secondary | ICD-10-CM | POA: Diagnosis not present

## 2020-08-20 DIAGNOSIS — H0288A Meibomian gland dysfunction right eye, upper and lower eyelids: Secondary | ICD-10-CM | POA: Diagnosis not present

## 2020-08-20 DIAGNOSIS — H2512 Age-related nuclear cataract, left eye: Secondary | ICD-10-CM | POA: Diagnosis not present

## 2020-08-20 DIAGNOSIS — H04123 Dry eye syndrome of bilateral lacrimal glands: Secondary | ICD-10-CM | POA: Diagnosis not present

## 2020-08-20 DIAGNOSIS — H0288B Meibomian gland dysfunction left eye, upper and lower eyelids: Secondary | ICD-10-CM | POA: Diagnosis not present

## 2020-08-20 MED ORDER — OXYCODONE-ACETAMINOPHEN 10-325 MG PO TABS: 1.0000 | ORAL_TABLET | ORAL | 0 refills | Status: AC | PRN

## 2020-08-20 NOTE — Telephone Encounter (Signed)
I sent in some Percocet

## 2020-08-20 NOTE — Telephone Encounter (Signed)
Please resend medications to [facility].  [id number] [address], [place] [address] [place] Phone:  [phone]  Fax:  [phone]

## 2020-08-20 NOTE — Telephone Encounter (Signed)
Pt is calling in stating that the Rx that was called in to the wrong pharmacy she will not be able to get it for 2 weeks and would like to see if it can be called in to [facility] on [address] and [address] because the pain is unbearable.  Pt stated that the tramadol that she has does not take care of the pain that she is having.

## 2020-08-21 NOTE — Telephone Encounter (Signed)
Patient called and advised Percocet sent to [facility] on yesterday per Dr. Sarajane Jews, she verbalized understanding.

## 2020-09-04 ENCOUNTER — Other Ambulatory Visit: Payer: Self-pay

## 2020-09-04 ENCOUNTER — Ambulatory Visit
Admission: RE | Admit: 2020-09-04 | Discharge: 2020-09-04 | Disposition: A | Discharge status: Home / Self Care | Payer: Medicare Other | Source: Ambulatory Visit | Attending: Family Medicine | Admitting: Family Medicine

## 2020-09-04 DIAGNOSIS — Z1231 Encounter for screening mammogram for malignant neoplasm of breast: Secondary | ICD-10-CM | POA: Diagnosis not present

## 2020-09-23 DIAGNOSIS — Z48810 Encounter for surgical aftercare following surgery on the sense organs: Secondary | ICD-10-CM | POA: Diagnosis not present

## 2020-09-23 DIAGNOSIS — H04123 Dry eye syndrome of bilateral lacrimal glands: Secondary | ICD-10-CM | POA: Diagnosis not present

## 2020-09-23 DIAGNOSIS — H0288B Meibomian gland dysfunction left eye, upper and lower eyelids: Secondary | ICD-10-CM | POA: Diagnosis not present

## 2020-09-23 DIAGNOSIS — H35371 Puckering of macula, right eye: Secondary | ICD-10-CM | POA: Diagnosis not present

## 2020-09-23 DIAGNOSIS — Z961 Presence of intraocular lens: Secondary | ICD-10-CM | POA: Diagnosis not present

## 2020-09-23 DIAGNOSIS — H0288A Meibomian gland dysfunction right eye, upper and lower eyelids: Secondary | ICD-10-CM | POA: Diagnosis not present

## 2020-09-23 DIAGNOSIS — H25812 Combined forms of age-related cataract, left eye: Secondary | ICD-10-CM | POA: Diagnosis not present

## 2020-09-23 DIAGNOSIS — H40211 Acute angle-closure glaucoma, right eye: Secondary | ICD-10-CM | POA: Diagnosis not present

## 2020-09-23 DIAGNOSIS — H4020X0 Unspecified primary angle-closure glaucoma, stage unspecified: Secondary | ICD-10-CM | POA: Diagnosis not present

## 2020-09-30 ENCOUNTER — Other Ambulatory Visit: Payer: Self-pay | Admitting: Family Medicine

## 2020-10-21 ENCOUNTER — Telehealth: Payer: Self-pay | Admitting: Internal Medicine

## 2020-10-21 MED ORDER — ESOMEPRAZOLE MAGNESIUM 40 MG PO CPDR
40.0000 mg | DELAYED_RELEASE_CAPSULE | Freq: Every day | ORAL | 0 refills | Status: DC
Start: 2020-10-21 — End: 2020-12-11

## 2020-10-21 NOTE — Telephone Encounter (Signed)
Rx sent 

## 2020-10-21 NOTE — Telephone Encounter (Signed)
Inbound call from patient requesting additional refills for Nexium medication be sent to [facility] please.

## 2020-10-31 ENCOUNTER — Other Ambulatory Visit: Payer: Self-pay | Admitting: Family Medicine

## 2020-11-13 ENCOUNTER — Other Ambulatory Visit: Payer: Self-pay

## 2020-11-13 ENCOUNTER — Ambulatory Visit (INDEPENDENT_AMBULATORY_CARE_PROVIDER_SITE_OTHER): Payer: Medicare Other

## 2020-11-13 DIAGNOSIS — Z0000 Encounter for general adult medical examination without abnormal findings: Secondary | ICD-10-CM

## 2020-11-13 NOTE — Patient Instructions (Addendum)
Audrey Brown , Thank you for taking time to come for your Medicare Wellness Visit. I appreciate your ongoing commitment to your health goals. Please review the following plan we discussed and let me know if I can assist you in the future.   Screening recommendations/referrals: Colonoscopy: Patient declined  Mammogram: Up to date, next due 09/04/2021 Bone Density: Up to date, next due 02/02/2023 Recommended yearly ophthalmology/optometry visit for glaucoma screening and checkup Recommended yearly dental visit for hygiene and checkup  Vaccinations: Influenza vaccine: Currently due, you may receive in our office or at the local pharmacy. Pneumococcal vaccine: Completed series  Tdap vaccine: Currently due, if you wish to receive you may contact your insurance first to discuss any out of pocket cost or you may await and injury to receive  Shingles vaccine: Currently due for Shingrix, if you wish to receive we recommend that you do so at your local pharmacy     Advanced directives: Please bring in a copy of your advanced medical directives so that we may scan them into your chart.   Conditions/risks identified: None   Next appointment: 11/14/2021 @ 3:30 PM with Nurse Health Advisor    Preventive Care 65 Years and Older, Female Preventive care refers to lifestyle choices and visits with your health care provider that can promote health and wellness. What does preventive care include?  A yearly physical exam. This is also called an annual well check.  Dental exams once or twice a year.  Routine eye exams. Ask your health care provider how often you should have your eyes checked.  Personal lifestyle choices, including:  Daily care of your teeth and gums.  Regular physical activity.  Eating a healthy diet.  Avoiding tobacco and drug use.  Limiting alcohol use.  Practicing safe sex.  Taking low-dose aspirin every day.  Taking vitamin and mineral supplements as recommended by your  health care provider. What happens during an annual well check? The services and screenings done by your health care provider during your annual well check will depend on your age, overall health, lifestyle risk factors, and family history of disease. Counseling  Your health care provider may ask you questions about your:  Alcohol use.  Tobacco use.  Drug use.  Emotional well-being.  Home and relationship well-being.  Sexual activity.  Eating habits.  History of falls.  Memory and ability to understand (cognition).  Work and work Astronomer.  Reproductive health. Screening  You may have the following tests or measurements:  Height, weight, and BMI.  Blood pressure.  Lipid and cholesterol levels. These may be checked every 5 years, or more frequently if you are over 76 years old.  Skin check.  Lung cancer screening. You may have this screening every year starting at age 29 if you have a 30-pack-year history of smoking and currently smoke or have quit within the past 15 years.  Fecal occult blood test (FOBT) of the stool. You may have this test every year starting at age 47.  Flexible sigmoidoscopy or colonoscopy. You may have a sigmoidoscopy every 5 years or a colonoscopy every 10 years starting at age 57.  Hepatitis C blood test.  Hepatitis B blood test.  Sexually transmitted disease (STD) testing.  Diabetes screening. This is done by checking your blood sugar (glucose) after you have not eaten for a while (fasting). You may have this done every 1-3 years.  Bone density scan. This is done to screen for osteoporosis. You may have this done starting  at age 61.  Mammogram. This may be done every 1-2 years. Talk to your health care provider about how often you should have regular mammograms. Talk with your health care provider about your test results, treatment options, and if necessary, the need for more tests. Vaccines  Your health care provider may recommend  certain vaccines, such as:  Influenza vaccine. This is recommended every year.  Tetanus, diphtheria, and acellular pertussis (Tdap, Td) vaccine. You may need a Td booster every 10 years.  Zoster vaccine. You may need this after age 42.  Pneumococcal 13-valent conjugate (PCV13) vaccine. One dose is recommended after age 86.  Pneumococcal polysaccharide (PPSV23) vaccine. One dose is recommended after age 88. Talk to your health care provider about which screenings and vaccines you need and how often you need them. This information is not intended to replace advice given to you by your health care provider. Make sure you discuss any questions you have with your health care provider. Document Released: 11/22/2015 Document Revised: 07/15/2016 Document Reviewed: 08/27/2015 Elsevier Interactive Patient Education  2017 [employer] Prevention in the Home Falls can cause injuries. They can happen to people of all ages. There are many things you can do to make your home safe and to help prevent falls. What can I do on the outside of my home?  Regularly fix the edges of walkways and driveways and fix any cracks.  Remove anything that might make you trip as you walk through a door, such as a raised step or threshold.  Trim any bushes or trees on the path to your home.  Use bright outdoor lighting.  Clear any walking paths of anything that might make someone trip, such as rocks or tools.  Regularly check to see if handrails are loose or broken. Make sure that both sides of any steps have handrails.  Any raised decks and porches should have guardrails on the edges.  Have any leaves, snow, or ice cleared regularly.  Use sand or salt on walking paths during winter.  Clean up any spills in your garage right away. This includes oil or grease spills. What can I do in the bathroom?  Use night lights.  Install grab bars by the toilet and in the tub and shower. Do not use towel bars as  grab bars.  Use non-skid mats or decals in the tub or shower.  If you need to sit down in the shower, use a plastic, non-slip stool.  Keep the floor dry. Clean up any water that spills on the floor as soon as it happens.  Remove soap buildup in the tub or shower regularly.  Attach bath mats securely with double-sided non-slip rug tape.  Do not have throw rugs and other things on the floor that can make you trip. What can I do in the bedroom?  Use night lights.  Make sure that you have a light by your bed that is easy to reach.  Do not use any sheets or blankets that are too big for your bed. They should not hang down onto the floor.  Have a firm chair that has side arms. You can use this for support while you get dressed.  Do not have throw rugs and other things on the floor that can make you trip. What can I do in the kitchen?  Clean up any spills right away.  Avoid walking on wet floors.  Keep items that you use a lot in easy-to-reach places.  If  you need to reach something above you, use a strong step stool that has a grab bar.  Keep electrical cords out of the way.  Do not use floor polish or wax that makes floors slippery. If you must use wax, use non-skid floor wax.  Do not have throw rugs and other things on the floor that can make you trip. What can I do with my stairs?  Do not leave any items on the stairs.  Make sure that there are handrails on both sides of the stairs and use them. Fix handrails that are broken or loose. Make sure that handrails are as long as the stairways.  Check any carpeting to make sure that it is firmly attached to the stairs. Fix any carpet that is loose or worn.  Avoid having throw rugs at the top or bottom of the stairs. If you do have throw rugs, attach them to the floor with carpet tape.  Make sure that you have a light switch at the top of the stairs and the bottom of the stairs. If you do not have them, ask someone to add them  for you. What else can I do to help prevent falls?  Wear shoes that:  Do not have high heels.  Have rubber bottoms.  Are comfortable and fit you well.  Are closed at the toe. Do not wear sandals.  If you use a stepladder:  Make sure that it is fully opened. Do not climb a closed stepladder.  Make sure that both sides of the stepladder are locked into place.  Ask someone to hold it for you, if possible.  Clearly mark and make sure that you can see:  Any grab bars or handrails.  First and last steps.  Where the edge of each step is.  Use tools that help you move around (mobility aids) if they are needed. These include:  Canes.  Walkers.  Scooters.  Crutches.  Turn on the lights when you go into a dark area. Replace any light bulbs as soon as they burn out.  Set up your furniture so you have a clear path. Avoid moving your furniture around.  If any of your floors are uneven, fix them.  If there are any pets around you, be aware of where they are.  Review your medicines with your doctor. Some medicines can make you feel dizzy. This can increase your chance of falling. Ask your doctor what other things that you can do to help prevent falls. This information is not intended to replace advice given to you by your health care provider. Make sure you discuss any questions you have with your health care provider. Document Released: 08/22/2009 Document Revised: 04/02/2016 Document Reviewed: 11/30/2014 Elsevier Interactive Patient Education  2017 [employer].

## 2020-11-13 NOTE — Progress Notes (Signed)
Subjective:   Audrey Brown is a 75 y.o. female who presents for Medicare Annual (Subsequent) preventive examination.  I connected with Audrey Brown today by telephone and verified that I am speaking with the correct person using two identifiers. Location patient: home Location provider: work Persons participating in the virtual visit: patient, provider.   I discussed the limitations, risks, security and privacy concerns of performing an evaluation and management service by telephone and the availability of in person appointments. I also discussed with the patient that there may be a patient responsible charge related to this service. The patient expressed understanding and verbally consented to this telephonic visit.    Interactive audio and video telecommunications were attempted between this provider and patient, however failed, due to patient having technical difficulties OR patient did not have access to video capability.  We continued and completed visit with audio only.      Review of Systems    N/A  Cardiac Risk Factors include: advanced age (>35men, >46 women)     Objective:    Today's Vitals   There is no height or weight on file to calculate BMI.  Advanced Directives 11/13/2020 10/25/2019 12/19/2018 11/03/2017 10/29/2017 07/01/2017 04/20/2017  Does Patient Have a Medical Advance Directive? Yes Yes Yes Yes Yes No Yes  Type of Paramedic of [place];Living will [place];Living will [place];Living will Healthcare Power of [place];Living will  Does patient want to make changes to medical advance directive? No - Patient declined No - Patient declined - No - Patient declined - - No - Patient declined  Copy of [place] in Chart? No - copy requested No - copy requested Yes - validated most recent copy scanned in chart (See row  information) Yes Yes - Yes  Would patient like information on creating a medical advance directive? - - - - - - -    Current Medications (verified) Outpatient Encounter Medications as of 11/13/2020  Medication Sig  . ALPRAZolam (XANAX) 0.25 MG tablet TAKE 1 TABLET THREE TIMES A DAY AS NEEDED FOR ANXIETY  . AMBULATORY NON FORMULARY MEDICATION Take 10 mg by mouth 3 (three) times daily. Medication Name: Domperidone 10 mg  . buPROPion (WELLBUTRIN XL) 300 MG 24 hr tablet Take 300 mg by mouth daily.   . busPIRone (BUSPAR) 10 MG tablet Take 10 mg by mouth 2 (two) times daily.   . Calcium Carb-Cholecalciferol 500-100 MG-UNIT CHEW Chew by mouth.  . calcium-vitamin D (OSCAL WITH D) 500-200 MG-UNIT tablet Take 1 tablet by mouth 2 (two) times daily.  . cyclobenzaprine (FLEXERIL) 10 MG tablet TAKE 1 TABLET THREE TIMES A DAY AS NEEDED FOR MUSCLE SPASMS  . esomeprazole (NEXIUM) 40 MG capsule Take 1 capsule (40 mg total) by mouth daily.  . eszopiclone (LUNESTA) 1 MG TABS tablet Take 1 tablet (1 mg total) by mouth at bedtime as needed for sleep. Take immediately before bedtime  . furosemide (LASIX) 20 MG tablet TAKE 1 TABLET DAILY AS NEEDED FOR FLUID  . ibuprofen (ADVIL) 800 MG tablet TAKE 1 TABLET EVERY 8 HOURS AS NEEDED FOR PAIN  . LamoTRIgine XR (LAMICTAL XR) 200 MG TB24 Take 1 tablet (200 mg total) by mouth daily.  . Magnesium 500 MG CAPS Take 400 mg by mouth at bedtime.   . Melatonin 5 MG CAPS Take 5 mg by mouth at bedtime.  . OSCIMIN 0.125 MG SUBL PLACE  1 TABLET UNDER THE TONGUE EVERY 6 HOURS AS NEEDED FOR CRAMPING (Patient taking differently: Place 0.125 mg under the tongue every 6 (six) hours as needed (cramping).)  . promethazine (PHENERGAN) 25 MG tablet Take 1 tablet (25 mg total) by mouth every 4 (four) hours as needed for nausea.  . propranolol (INDERAL) 10 MG tablet Take 10 mg by mouth 2 (two) times daily.  . temazepam (RESTORIL) 30 MG capsule Take 30 mg by mouth at bedtime.  . traMADol (ULTRAM)  50 MG tablet TAKE 2 TABLETS BY MOUTH EVERY 8 HOURS AS NEEDED FOR MODERATE PAIN  . verapamil (CALAN) 80 MG tablet TAKE 1 TABLET THREE TIMES A DAY (Patient taking differently: Take 80 mg by mouth 3 (three) times daily.)   No facility-administered encounter medications on file as of 11/13/2020.    Allergies (verified) Celebrex [celecoxib], Morphine and related, Penicillins, Risperdal [risperidone], and Statins   History: Past Medical History:  Diagnosis Date  . Arthritis    lower back  . Bipolar disorder ([place])   . Depression   . GERD (gastroesophageal reflux disease)   . Glaucoma    right eye  . Hiatal hernia   . Loss of vision    right eye  . Parkinson disease ([place])   . Poor short-term memory   . Sagittal band rupture at metacarpophalangeal joint 11/2016   right long finger   Past Surgical History:  Procedure Laterality Date  . ANKLE SURGERY    . BALLOON DILATION N/A 10/29/2017   Procedure: BALLOON DILATION;  Surgeon: Jerene Bears, MD;  Location: [facility];  Service: Gastroenterology;  Laterality: N/A;  . CATARACT EXTRACTION Right 08/07/2020  . [place]  . CESAREAN SECTION    . CHOLECYSTECTOMY    . CYST REMOVAL NECK    . ESOPHAGEAL DILATION  12/19/2018   Procedure: ESOPHAGEAL DILATION;  Surgeon: Jerene Bears, MD;  Location: [facility];  Service: Gastroenterology;;  . ESOPHAGOGASTRODUODENOSCOPY (EGD) WITH ESOPHAGEAL DILATION  07/27/2012   with Propofol  . ESOPHAGOGASTRODUODENOSCOPY (EGD) WITH PROPOFOL N/A 06/23/2016   Procedure: ESOPHAGOGASTRODUODENOSCOPY (EGD) WITH PROPOFOL;  Surgeon: Jerene Bears, MD;  Location: [facility];  Service: Gastroenterology;  Laterality: N/A;  . ESOPHAGOGASTRODUODENOSCOPY (EGD) WITH PROPOFOL N/A 10/29/2017   Procedure: ESOPHAGOGASTRODUODENOSCOPY (EGD) WITH PROPOFOL;  Surgeon: Jerene Bears, MD;  Location: [facility];  Service: Gastroenterology;  Laterality: N/A;  . ESOPHAGOGASTRODUODENOSCOPY (EGD) WITH PROPOFOL N/A  12/19/2018   Procedure: ESOPHAGOGASTRODUODENOSCOPY (EGD) WITH PROPOFOL;  Surgeon: Jerene Bears, MD;  Location: [facility];  Service: Gastroenterology;  Laterality: N/A;  . EYE SURGERY    . EYE SURGERY  09/2019   R eye procedure to relieve pressure  . EYE SURGERY    . FOOT NEUROMA SURGERY Left   . ORIF DISTAL RADIUS FRACTURE Right 07/04/2010  . SAVORY DILATION N/A 06/23/2016   Procedure: SAVORY DILATION;  Surgeon: Jerene Bears, MD;  Location: [facility];  Service: Gastroenterology;  Laterality: N/A;  . TENDON REPAIR Right 12/01/2016   Procedure: right long finger ulnar sagittal band reconstruction;  Surgeon: Leanora Cover, MD;  Location: [facility];  Service: Orthopedics;  Laterality: Right;  . TONSILLECTOMY     Family History  Problem Relation Age of Onset  . Emphysema Mother   . Heart attack Father   . Hypertension Other   . Lung cancer Other   . Colon cancer Neg Hx   . Esophageal cancer Neg Hx   . Rectal cancer Neg Hx   .  Stomach cancer Neg Hx    Social History   Socioeconomic History  . Marital status: Widowed    Spouse name: Not on file  . Number of children: 4  . Years of education: 6 years college  . Highest education level: Master's degree (e.g., MA, MS, MEng, MEd, MSW, MBA)  Occupational History  . Occupation: Retired    Fish farm manager: RETIRED    Comment: [employer]  Tobacco Use  . Smoking status: Never Smoker  . Smokeless tobacco: Never Used  Vaping Use  . Vaping Use: Never used  Substance and Sexual Activity  . Alcohol use: No    Alcohol/week: 0.0 standard drinks  . Drug use: No  . Sexual activity: Not Currently  Other Topics Concern  . Not on file  Social History Narrative   Daughter is her POA and must come to all visits in GI and [place]       Patient is right-handed. She lives with her daughter and granddaughter in a split level home with a chair lift. Uses a power chair. She drinks 3-4 cups of coffee a day.      Has 4 daughters, 3 grand children,  and 5 great-grand children.   Social Determinants of Health   Financial Resource Strain: Low Risk   . Difficulty of Paying Living Expenses: Not hard at all  Food Insecurity: No Food Insecurity  . Worried About Charity fundraiser in the Last Year: Never true  . Ran Out of Food in the Last Year: Never true  Transportation Needs: No Transportation Needs  . Lack of Transportation (Medical): No  . Lack of Transportation (Non-Medical): No  Physical Activity: Inactive  . Days of Exercise per Week: 0 days  . Minutes of Exercise per Session: 0 min  Stress: No Stress Concern Present  . Feeling of Stress : Not at all  Social Connections: Moderately Integrated  . Frequency of Communication with Friends and Family: Three times a week  . Frequency of Social Gatherings with Friends and Family: More than three times a week  . Attends Religious Services: 1 to 4 times per year  . Active Member of Clubs or Organizations: Yes  . Attends Archivist Meetings: More than 4 times per year  . Marital Status: Widowed    Tobacco Counseling Counseling given: Not Answered   Clinical Intake:  Pre-visit preparation completed: Yes        Nutritional Risks: Nausea/ vomitting/ diarrhea Diabetes: No  How often do you need to have someone help you when you read instructions, pamphlets, or other written materials from your doctor or pharmacy?: 3 - Sometimes  Diabetic?No   Interpreter Needed?: No  Information entered by :: [place] of Daily Living In your present state of health, do you have any difficulty performing the following activities: 11/13/2020  Hearing? N  Vision? Y  Difficulty concentrating or making decisions? Y  Comment Patient states has short term memory loss  Walking or climbing stairs? Y  Dressing or bathing? Y  Doing errands, shopping? Y  Preparing Food and eating ? Y  Using the Toilet? N  In the past six months, have you accidently leaked urine? Y  Do  you have problems with loss of bowel control? N  Managing your Medications? Y  Managing your Finances? N  Housekeeping or managing your Housekeeping? Y  Some recent data might be hidden    Patient Care Team: Laurey Morale, MD as PCP - Loman [place], Adam  R, DO as Consulting Physician (Neurology)  Indicate any recent Medical Services you may have received from other than [facility] providers in the past year (date may be approximate).     Assessment:   This is a routine wellness examination for Audrey Brown.  Hearing/Vision screen  Hearing Screening   125Hz  250Hz  500Hz  1000Hz  2000Hz  3000Hz  4000Hz  6000Hz  8000Hz   Right ear:           Left ear:           Vision Screening Comments: Patient gets eyes examined yearly    Dietary issues and exercise activities discussed: Current Exercise Habits: The patient does not participate in regular exercise at present, Exercise limited by: orthopedic condition(s);psychological condition(s)  Goals    . DIET - INCREASE WATER INTAKE    . Prevent falls      Depression Screen PHQ 2/9 Scores 11/13/2020 10/25/2019 06/19/2015 03/19/2015 02/28/2015  PHQ - 2 Score 0 1 0 0 0  PHQ- 9 Score 0 - - 0 -    Fall Risk Fall Risk  11/13/2020 10/25/2019 10/03/2019 11/08/2018 10/16/2016  Falls in the past year? 1 1 1 1  Yes  Comment - - [facility]i Telephone Survey: data to providers prior to load - Emmi Telephone Survey: data to providers prior to load  Number falls in past yr: 0 1 1 1 2  or more  Comment - - Emmi Telephone Survey Actual Response = 3 - Emmi Telephone Survey Actual Response = 20  Injury with Fall? 0 1 0 1 Yes  Risk for fall due to : Impaired mobility;Impaired balance/gait;Impaired vision History of fall(s);Impaired vision;Medication side effect;Impaired balance/gait;Impaired mobility - - -  Follow up Falls evaluation completed;Falls prevention discussed Falls evaluation completed;Education provided;Falls prevention discussed - Falls evaluation completed -    FALL RISK  PREVENTION PERTAINING TO THE HOME:  Any stairs in or around the home? No  If so, are there any without handrails? No  Home free of loose throw rugs in walkways, pet beds, electrical cords, etc? Yes  Adequate lighting in your home to reduce risk of falls? Yes   ASSISTIVE DEVICES UTILIZED TO PREVENT FALLS:  Life alert? No  Use of a cane, walker or w/c? Yes  Grab bars in the bathroom? Yes  Shower chair or bench in shower? Yes  Elevated toilet seat or a handicapped toilet? Yes   Cognitive Function: Cognitive status assessed by direct observation. Patient has current diagnosis of cognitive impairment.     6CIT Screen 10/25/2019  What Year? 0 points  What month? 0 points  What time? 0 points  Count back from 20 0 points  Months in reverse 0 points  Repeat phrase 4 points  Total Score 4    Immunizations Immunization History  Administered Date(s) Administered  . Influenza Split 08/02/2013  . Influenza, High Dose Seasonal PF 07/30/2015, 07/20/2017, 08/23/2018, 08/10/2019  . Influenza-Unspecified 08/08/2014, 07/27/2016  . Moderna Sars-Covid-2 Vaccination 02/12/2020, 03/11/2020  . Pneumococcal Conjugate-13 12/25/2015  . Pneumococcal Polysaccharide-23 08/23/2018    TDAP status: Due, Education has been provided regarding the importance of this vaccine. Advised may receive this vaccine at local pharmacy or Health Dept. Aware to provide a copy of the vaccination record if obtained from local pharmacy or Health Dept. Verbalized acceptance and understanding.  Flu Vaccine status: Due, Education has been provided regarding the importance of this vaccine. Advised may receive this vaccine at local pharmacy or Health Dept. Aware to provide a copy of the vaccination record if obtained from local pharmacy  or Health Dept. Verbalized acceptance and understanding.  Pneumococcal vaccine status: Up to date  Covid-19 vaccine status: Completed vaccines  Qualifies for Shingles Vaccine? Yes    Zostavax completed No   Shingrix Completed?: No.    Education has been provided regarding the importance of this vaccine. Patient has been advised to call insurance company to determine out of pocket expense if they have not yet received this vaccine. Advised may also receive vaccine at local pharmacy or Health Dept. Verbalized acceptance and understanding.  Screening Tests Health Maintenance  Topic Date Due  . Hepatitis C Screening  Never done  . TETANUS/TDAP  Never done  . COLONOSCOPY (Pts 45-24yrs Insurance coverage will need to be confirmed)  Never done  . INFLUENZA VACCINE  06/09/2020  . COVID-19 Vaccine (3 - Booster for Moderna series) 09/11/2020  . MAMMOGRAM  09/04/2022  . DEXA SCAN  Completed  . PNA vac Low Risk Adult  Completed    Health Maintenance  Health Maintenance Due  Topic Date Due  . Hepatitis C Screening  Never done  . TETANUS/TDAP  Never done  . COLONOSCOPY (Pts 45-54yrs Insurance coverage will need to be confirmed)  Never done  . INFLUENZA VACCINE  06/09/2020  . COVID-19 Vaccine (3 - Booster for Moderna series) 09/11/2020    Colorectal cancer screening: Patient declined colon screening  Mammogram status: Completed 09/04/2020. Repeat every year  Bone Density status: Completed 02/01/2018. Results reflect: Bone density results: OSTEOPENIA. Repeat every 5 years.  Lung Cancer Screening: (Low Dose CT Chest recommended if Age 58-80 years, 30 pack-year currently smoking OR have quit w/in 15years.) does not qualify.   Lung Cancer Screening Referral: N/A   Additional Screening:  Hepatitis C Screening: does qualify;  Vision Screening: Recommended annual ophthalmology exams for early detection of glaucoma and other disorders of the eye. Is the patient up to date with their annual eye exam?  Yes  Who is the provider or what is the name of the office in which the patient attends annual eye exams? Dr. Melene Muller If pt is not established with a provider, would they  like to be referred to a provider to establish care? No .   Dental Screening: Recommended annual dental exams for proper oral hygiene  Community Resource Referral / Chronic Care Management: CRR required this visit?  No   CCM required this visit?  No      Plan:     I have personally reviewed and noted the following in the patient's chart:   . Medical and social history . Use of alcohol, tobacco or illicit drugs  . Current medications and supplements . Functional ability and status . Nutritional status . Physical activity . Advanced directives . List of other physicians . Hospitalizations, surgeries, and ER visits in previous 12 months . Vitals . Screenings to include cognitive, depression, and falls . Referrals and appointments  In addition, I have reviewed and discussed with patient certain preventive protocols, quality metrics, and best practice recommendations. A written personalized care plan for preventive services as well as general preventive health recommendations were provided to patient.     Theodora Blow, LPN   06/16/4165   Nurse Notes: None

## 2020-11-26 ENCOUNTER — Telehealth: Payer: Self-pay | Admitting: Family Medicine

## 2020-11-26 NOTE — Telephone Encounter (Signed)
Pt is calling in stating that she has a very bad cough and would like to have a cough syrup called in.  Pharm:  [facility] on [address] and [address].  Pt declined an virtual appointment.

## 2020-11-27 MED ORDER — HYDROCODONE-HOMATROPINE 5-1.5 MG/5ML PO SYRP
5.0000 mL | ORAL_SOLUTION | ORAL | 0 refills | Status: DC | PRN
Start: 2020-11-27 — End: 2020-12-10

## 2020-11-27 NOTE — Telephone Encounter (Signed)
Done

## 2020-12-10 ENCOUNTER — Telehealth: Payer: Self-pay | Admitting: Family Medicine

## 2020-12-10 MED ORDER — HYDROCODONE-HOMATROPINE 5-1.5 MG/5ML PO SYRP
5.0000 mL | ORAL_SOLUTION | ORAL | 0 refills | Status: DC | PRN
Start: 2020-12-10 — End: 2021-03-13

## 2020-12-10 NOTE — Telephone Encounter (Signed)
Last office visit: 11/13/2020 Last refill: 11/27/2020

## 2020-12-10 NOTE — Telephone Encounter (Signed)
pt  requesting a refill on medication  HYDROcodone-homatropine (HYCODAN) 5-1.5 MG/5ML syrup   [id number] [address], [place] AT [address]  Phone: [phone] Fax:  [phone]

## 2020-12-10 NOTE — Telephone Encounter (Signed)
I sent this in

## 2020-12-11 ENCOUNTER — Telehealth: Payer: Self-pay | Admitting: Internal Medicine

## 2020-12-11 MED ORDER — AMBULATORY NON FORMULARY MEDICATION: 10.0000 mg | Freq: Three times a day (TID) | 1 refills | Status: DC

## 2020-12-11 MED ORDER — ESOMEPRAZOLE MAGNESIUM 40 MG PO CPDR
40.0000 mg | DELAYED_RELEASE_CAPSULE | Freq: Every day | ORAL | 1 refills | Status: DC
Start: 2020-12-11 — End: 2021-02-10

## 2020-12-11 NOTE — Telephone Encounter (Signed)
Requesting refill on Nexium and Domperidone

## 2020-12-11 NOTE — Telephone Encounter (Signed)
Rx for Nexium sent to mail in pharmacy. Rx for domperidone sent to [facility] fax.

## 2020-12-18 DIAGNOSIS — E039 Hypothyroidism, unspecified: Secondary | ICD-10-CM | POA: Diagnosis not present

## 2020-12-18 DIAGNOSIS — H2512 Age-related nuclear cataract, left eye: Secondary | ICD-10-CM | POA: Diagnosis not present

## 2020-12-18 DIAGNOSIS — I10 Essential (primary) hypertension: Secondary | ICD-10-CM | POA: Diagnosis not present

## 2020-12-19 ENCOUNTER — Other Ambulatory Visit: Payer: Self-pay

## 2020-12-19 ENCOUNTER — Ambulatory Visit (INDEPENDENT_AMBULATORY_CARE_PROVIDER_SITE_OTHER): Payer: Medicare Other | Admitting: Adult Health

## 2020-12-19 ENCOUNTER — Encounter: Payer: Self-pay | Admitting: Adult Health

## 2020-12-19 VITALS — BP 170/110 | Temp 98.5°F

## 2020-12-19 DIAGNOSIS — I10 Essential (primary) hypertension: Secondary | ICD-10-CM

## 2020-12-19 MED ORDER — LISINOPRIL 10 MG PO TABS: 10.0000 mg | ORAL_TABLET | Freq: Every day | ORAL | 0 refills | Status: DC

## 2020-12-19 NOTE — Addendum Note (Signed)
Addended by: Marrion Coy on: 12/19/2020 03:00 PM   Modules accepted: Orders

## 2020-12-19 NOTE — Progress Notes (Signed)
Subjective:    Patient ID: Audrey Brown, female    DOB: [birth year], 75 y.o.   MRN: [id number]  HPI 75 year old female who  has a past medical history of Arthritis, Bipolar disorder ([place]), Depression, GERD (gastroesophageal reflux disease), Glaucoma, Hiatal hernia, Loss of vision, Parkinson disease ([place]), Poor short-term memory, and Sagittal band rupture at metacarpophalangeal joint (11/2016).   She is a patient of Dr. Sarajane Jews who I am seeing today for an acute issue of elevated blood pressure readings.  She was at [facility] patient surgery center yesterday for cataract removal at this time her blood pressure was 211/136.  Today in the office her blood pressure is 170/100 on 2 separate readings.  She has not been treated in the past for hypertension, was on verapamil for headache prevention.  She is unsure of how long her blood pressure has been elevated as she does not check at home.  Has developed a mild headache but this could also be from her cataract surgery.  Does endorse intermittent episodes of "chest tightness" no apparent chest pain or shortness of breath.  Takes Lasix as needed for lower extremity edema.  BP Readings from Last 10 Encounters:  12/19/20 (!) 170/110  08/19/20 110/68  05/08/20 (!) 172/91  05/03/20 132/88  10/06/19 (!) 157/87  12/19/18 (!) 161/77  11/11/18 126/88  11/08/18 (!) 138/92  10/13/18 126/84  09/20/18 120/74    Review of Systems See HPI   Past Medical History:  Diagnosis Date  . Arthritis    lower back  . Bipolar disorder ([place])   . Depression   . GERD (gastroesophageal reflux disease)   . Glaucoma    right eye  . Hiatal hernia   . Loss of vision    right eye  . Parkinson disease ([place])   . Poor short-term memory   . Sagittal band rupture at metacarpophalangeal joint 11/2016   right long finger    Social History   Socioeconomic History  . Marital status: Widowed    Spouse name: Not on file  . Number of children: 4  . Years of  education: 6 years college  . Highest education level: Master's degree (e.g., MA, MS, MEng, MEd, MSW, MBA)  Occupational History  . Occupation: Retired    Fish farm manager: RETIRED    Comment: [employer]  Tobacco Use  . Smoking status: Never Smoker  . Smokeless tobacco: Never Used  Vaping Use  . Vaping Use: Never used  Substance and Sexual Activity  . Alcohol use: No    Alcohol/week: 0.0 standard drinks  . Drug use: No  . Sexual activity: Not Currently  Other Topics Concern  . Not on file  Social History Narrative   Daughter is her POA and must come to all visits in [place] and [place]       Patient is right-handed. She lives with her daughter and granddaughter in a split level home with a chair lift. Uses a power chair. She drinks 3-4 cups of coffee a day.      Has 4 daughters, 3 grand children, and 5 great-grand children.   Social Determinants of Health   Financial Resource Strain: Low Risk   . Difficulty of Paying Living Expenses: Not hard at all  Food Insecurity: No Food Insecurity  . Worried About Charity fundraiser in the Last Year: Never true  . Ran Out of Food in the Last Year: Never true  Transportation Needs: No Transportation Needs  . Lack of  Transportation (Medical): No  . Lack of Transportation (Non-Medical): No  Physical Activity: Inactive  . Days of Exercise per Week: 0 days  . Minutes of Exercise per Session: 0 min  Stress: No Stress Concern Present  . Feeling of Stress : Not at all  Social Connections: Moderately Integrated  . Frequency of Communication with Friends and Family: Three times a week  . Frequency of Social Gatherings with Friends and Family: More than three times a week  . Attends Religious Services: 1 to 4 times per year  . Active Member of Clubs or Organizations: Yes  . Attends Archivist Meetings: More than 4 times per year  . Marital Status: Widowed  Intimate Partner Violence: Not At Risk  . Fear of Current or Ex-Partner: No  . Emotionally  Abused: No  . Physically Abused: No  . Sexually Abused: No    Past Surgical History:  Procedure Laterality Date  . ANKLE SURGERY    . BALLOON DILATION N/A 10/29/2017   Procedure: BALLOON DILATION;  Surgeon: Jerene Bears, MD;  Location: [facility];  Service: Gastroenterology;  Laterality: N/A;  . CATARACT EXTRACTION Right 08/07/2020  . [place]  . CESAREAN SECTION    . CHOLECYSTECTOMY    . CYST REMOVAL NECK    . ESOPHAGEAL DILATION  12/19/2018   Procedure: ESOPHAGEAL DILATION;  Surgeon: Jerene Bears, MD;  Location: [facility];  Service: Gastroenterology;;  . ESOPHAGOGASTRODUODENOSCOPY (EGD) WITH ESOPHAGEAL DILATION  07/27/2012   with Propofol  . ESOPHAGOGASTRODUODENOSCOPY (EGD) WITH PROPOFOL N/A 06/23/2016   Procedure: ESOPHAGOGASTRODUODENOSCOPY (EGD) WITH PROPOFOL;  Surgeon: Jerene Bears, MD;  Location: [facility];  Service: Gastroenterology;  Laterality: N/A;  . ESOPHAGOGASTRODUODENOSCOPY (EGD) WITH PROPOFOL N/A 10/29/2017   Procedure: ESOPHAGOGASTRODUODENOSCOPY (EGD) WITH PROPOFOL;  Surgeon: Jerene Bears, MD;  Location: [facility];  Service: Gastroenterology;  Laterality: N/A;  . ESOPHAGOGASTRODUODENOSCOPY (EGD) WITH PROPOFOL N/A 12/19/2018   Procedure: ESOPHAGOGASTRODUODENOSCOPY (EGD) WITH PROPOFOL;  Surgeon: Jerene Bears, MD;  Location: [facility];  Service: Gastroenterology;  Laterality: N/A;  . EYE SURGERY    . EYE SURGERY  09/2019   R eye procedure to relieve pressure  . EYE SURGERY    . FOOT NEUROMA SURGERY Left   . ORIF DISTAL RADIUS FRACTURE Right 07/04/2010  . SAVORY DILATION N/A 06/23/2016   Procedure: SAVORY DILATION;  Surgeon: Jerene Bears, MD;  Location: [facility];  Service: Gastroenterology;  Laterality: N/A;  . TENDON REPAIR Right 12/01/2016   Procedure: right long finger ulnar sagittal band reconstruction;  Surgeon: Leanora Cover, MD;  Location: [facility];  Service: Orthopedics;  Laterality: Right;  . TONSILLECTOMY       Family History  Problem Relation Age of Onset  . Emphysema Mother   . Heart attack Father   . Hypertension Other   . Lung cancer Other   . Colon cancer Neg Hx   . Esophageal cancer Neg Hx   . Rectal cancer Neg Hx   . Stomach cancer Neg Hx     Allergies  Allergen Reactions  . Celebrex [Celecoxib] Hives  . Morphine And Related Hives  . Penicillins Hives, Nausea Only and Other (See Comments)    Has patient had a PCN reaction causing immediate rash, facial/tongue/throat swelling, SOB or lightheadedness with hypotension: Yes Has patient had a PCN reaction causing severe rash involving mucus membranes or skin necrosis: Yes Has patient had a PCN reaction that required hospitalization: Yes, was already in the  hospital  Has patient had a PCN reaction occurring within the last 10 years: No If all of the above answers are "NO", then may proceed with Cephalosporin use.   Marland Kitchen Risperdal [Risperidone] Other (See Comments)    Tremors and seizures  . Statins Other (See Comments)    MUSCLE ACHES    Current Outpatient Medications on File Prior to Visit  Medication Sig Dispense Refill  . ALPRAZolam (XANAX) 0.25 MG tablet TAKE 1 TABLET THREE TIMES A DAY AS NEEDED FOR ANXIETY 270 tablet 1  . AMBULATORY NON FORMULARY MEDICATION Take 10 mg by mouth 3 (three) times daily. Medication Name: Domperidone 10 mg 360 tablet 1  . buPROPion (WELLBUTRIN XL) 300 MG 24 hr tablet Take 300 mg by mouth daily.     . busPIRone (BUSPAR) 10 MG tablet Take 10 mg by mouth 2 (two) times daily.     . Calcium Carb-Cholecalciferol 500-100 MG-UNIT CHEW Chew by mouth.    . calcium-vitamin D (OSCAL WITH D) 500-200 MG-UNIT tablet Take 1 tablet by mouth 2 (two) times daily.    . cyclobenzaprine (FLEXERIL) 10 MG tablet TAKE 1 TABLET THREE TIMES A DAY AS NEEDED FOR MUSCLE SPASMS 270 tablet 3  . esomeprazole (NEXIUM) 40 MG capsule Take 1 capsule (40 mg total) by mouth daily. 90 capsule 1  . eszopiclone (LUNESTA) 1 MG TABS tablet  Take 1 tablet (1 mg total) by mouth at bedtime as needed for sleep. Take immediately before bedtime 90 tablet 1  . furosemide (LASIX) 20 MG tablet TAKE 1 TABLET DAILY AS NEEDED FOR FLUID 90 tablet 3  . HYDROcodone-homatropine (HYCODAN) 5-1.5 MG/5ML syrup Take 5 mLs by mouth every 4 (four) hours as needed for cough. 240 mL 0  . ibuprofen (ADVIL) 800 MG tablet TAKE 1 TABLET EVERY 8 HOURS AS NEEDED FOR PAIN 270 tablet 3  . LamoTRIgine XR (LAMICTAL XR) 200 MG TB24 Take 1 tablet (200 mg total) by mouth daily. 90 tablet 1  . Magnesium 500 MG CAPS Take 400 mg by mouth at bedtime.     . Melatonin 5 MG CAPS Take 5 mg by mouth at bedtime.    . OSCIMIN 0.125 MG SUBL PLACE 1 TABLET UNDER THE TONGUE EVERY 6 HOURS AS NEEDED FOR CRAMPING (Patient taking differently: Place 0.125 mg under the tongue every 6 (six) hours as needed (cramping).) 360 each 0  . promethazine (PHENERGAN) 25 MG tablet Take 1 tablet (25 mg total) by mouth every 4 (four) hours as needed for nausea. 180 tablet 2  . propranolol (INDERAL) 10 MG tablet Take 10 mg by mouth 2 (two) times daily.    . temazepam (RESTORIL) 30 MG capsule Take 30 mg by mouth at bedtime.    . traMADol (ULTRAM) 50 MG tablet TAKE 2 TABLETS BY MOUTH EVERY 8 HOURS AS NEEDED FOR MODERATE PAIN 60 tablet 5  . verapamil (CALAN) 80 MG tablet TAKE 1 TABLET THREE TIMES A DAY (Patient not taking: Reported on 12/19/2020) 270 tablet 3   No current facility-administered medications on file prior to visit.    BP (!) 170/110   Temp 98.5 F (36.9 C)       Objective:   Physical Exam Vitals and nursing note reviewed.  Constitutional:      General: She is not in acute distress.    Appearance: Normal appearance. She is well-developed. She is not ill-appearing.  HENT:     Head: Normocephalic and atraumatic.     Right Ear: Tympanic membrane, ear canal  and external ear normal. There is no impacted cerumen.     Left Ear: Tympanic membrane, ear canal and external ear normal. There is  no impacted cerumen.     Nose: Nose normal. No congestion or rhinorrhea.     Mouth/Throat:     Mouth: Mucous membranes are moist.     Pharynx: Oropharynx is clear. No oropharyngeal exudate or posterior oropharyngeal erythema.  Eyes:     General:        Right eye: No discharge.        Left eye: No discharge.     Extraocular Movements: Extraocular movements intact.     Conjunctiva/sclera: Conjunctivae normal.     Pupils: Pupils are equal, round, and reactive to light.  Neck:     Thyroid: No thyromegaly.     Vascular: No carotid bruit.     Trachea: No tracheal deviation.  Cardiovascular:     Rate and Rhythm: Normal rate and regular rhythm.     Pulses: Normal pulses.     Heart sounds: Normal heart sounds. No murmur heard. No friction rub. No gallop.   Pulmonary:     Effort: Pulmonary effort is normal. No respiratory distress.     Breath sounds: Normal breath sounds. No stridor. No wheezing, rhonchi or rales.  Chest:     Chest wall: No tenderness.  Abdominal:     General: Abdomen is flat. Bowel sounds are normal. There is no distension.     Palpations: Abdomen is soft. There is no mass.     Tenderness: There is no abdominal tenderness. There is no right CVA tenderness, left CVA tenderness, guarding or rebound.     Hernia: No hernia is present.  Musculoskeletal:        General: No swelling, tenderness, deformity or signs of injury. Normal range of motion.     Cervical back: Normal range of motion and neck supple.     Right lower leg: No edema.     Left lower leg: No edema.  Lymphadenopathy:     Cervical: No cervical adenopathy.  Skin:    General: Skin is warm and dry.     Coloration: Skin is not jaundiced or pale.     Findings: No bruising, erythema, lesion or rash.  Neurological:     General: No focal deficit present.     Mental Status: She is alert and oriented to person, place, and time.     Cranial Nerves: No cranial nerve deficit.     Sensory: No sensory deficit.      Motor: No weakness.     Coordination: Coordination normal.     Gait: Gait normal.     Deep Tendon Reflexes: Reflexes normal.  Psychiatric:        Mood and Affect: Mood normal.        Behavior: Behavior normal.        Thought Content: Thought content normal.        Judgment: Judgment normal.       Assessment & Plan:  1. Essential hypertension  - Basic Metabolic Panel; Future - Urinalysis; Future - EKG 12-Lead- SR, Rate 72. -Poor R-wave progression -nonspecific -consider old anterior infarct. No change from previous EKG in 2018 - lisinopril (ZESTRIL) 10 MG tablet; Take 1 tablet (10 mg total) by mouth daily.  Dispense: 30 tablet; Refill: 0 - Follow up with PCP next week   Dorothyann Peng

## 2020-12-19 NOTE — Patient Instructions (Signed)
It was great meeting you today   I am going to do some lab work to check your kidneys   I am also going to prescribe a blood pressure medication called Lisinopril, take this daily.   Please follow up with Dr. Sarajane Jews early next week

## 2020-12-20 LAB — BASIC METABOLIC PANEL
BUN: 16 mg/dL (ref 6–23)
CO2: 30 mEq/L (ref 19–32)
Calcium: 9.6 mg/dL (ref 8.4–10.5)
Chloride: 103 mEq/L (ref 96–112)
Creatinine, Ser: 1.03 mg/dL (ref 0.40–1.20)
GFR: 54.37 mL/min — ABNORMAL LOW (ref >60.00)
Glucose, Bld: 81 mg/dL (ref 70–99)
Potassium: 4.3 mEq/L (ref 3.5–5.1)
Sodium: 143 mEq/L (ref 135–145)

## 2020-12-20 LAB — URINALYSIS
Bilirubin Urine: NEGATIVE
Hgb urine dipstick: NEGATIVE
Ketones, ur: NEGATIVE
Leukocytes,Ua: NEGATIVE
Nitrite: NEGATIVE
Specific Gravity, Urine: 1.02 (ref 1.000–1.030)
Total Protein, Urine: NEGATIVE
Urine Glucose: NEGATIVE
Urobilinogen, UA: 0.2 (ref 0.0–1.0)
pH: 6 (ref 5.0–8.0)

## 2020-12-23 ENCOUNTER — Ambulatory Visit (INDEPENDENT_AMBULATORY_CARE_PROVIDER_SITE_OTHER): Payer: Medicare Other | Admitting: Family Medicine

## 2020-12-23 ENCOUNTER — Other Ambulatory Visit: Payer: Self-pay

## 2020-12-23 ENCOUNTER — Encounter: Payer: Self-pay | Admitting: Family Medicine

## 2020-12-23 VITALS — BP 130/84 | HR 74 | Temp 97.9°F | Ht 62.0 in | Wt 185.0 lb

## 2020-12-23 DIAGNOSIS — I10 Essential (primary) hypertension: Secondary | ICD-10-CM | POA: Diagnosis not present

## 2020-12-23 DIAGNOSIS — Z23 Encounter for immunization: Secondary | ICD-10-CM | POA: Diagnosis not present

## 2020-12-23 MED ORDER — ESZOPICLONE 1 MG PO TABS: 1.0000 mg | ORAL_TABLET | Freq: Every evening | ORAL | 1 refills | Status: DC | PRN

## 2020-12-23 MED ORDER — LISINOPRIL 10 MG PO TABS: 10.0000 mg | ORAL_TABLET | Freq: Every day | ORAL | 3 refills | Status: DC

## 2020-12-23 NOTE — Addendum Note (Signed)
Addended by: Konrad Saha on: 12/23/2020 02:46 PM   Modules accepted: Orders

## 2020-12-23 NOTE — Progress Notes (Signed)
   Subjective:    Patient ID: Audrey Brown, female    DOB: 1946-05-26, 75 y.o.   MRN: [id number]  HPI Here with her daughter to follow up on recent onset of HTN. She was seen here last week and was started on Lisinopril 10 mg daily. She has felt well. Her BP at home has ranged from 120-150 over 70-90. The recent BMET was unremarkable.    Review of Systems  Constitutional: Negative.   Respiratory: Negative.   Cardiovascular: Negative.        Objective:   Physical Exam Constitutional:      Appearance: Normal appearance.     Comments: In her wheelchair   Cardiovascular:     Rate and Rhythm: Normal rate and regular rhythm.     Pulses: Normal pulses.     Heart sounds: Normal heart sounds.  Pulmonary:     Effort: Pulmonary effort is normal.     Breath sounds: Normal breath sounds.  Neurological:     Mental Status: She is alert.           Assessment & Plan:  HTN, now stable. She will stay on the current regimen.  Alysia Penna, MD

## 2021-01-14 DIAGNOSIS — H0288B Meibomian gland dysfunction left eye, upper and lower eyelids: Secondary | ICD-10-CM | POA: Diagnosis not present

## 2021-01-14 DIAGNOSIS — H40032 Anatomical narrow angle, left eye: Secondary | ICD-10-CM | POA: Diagnosis not present

## 2021-01-14 DIAGNOSIS — Z48810 Encounter for surgical aftercare following surgery on the sense organs: Secondary | ICD-10-CM | POA: Diagnosis not present

## 2021-01-14 DIAGNOSIS — H40211 Acute angle-closure glaucoma, right eye: Secondary | ICD-10-CM | POA: Diagnosis not present

## 2021-01-14 DIAGNOSIS — H0288A Meibomian gland dysfunction right eye, upper and lower eyelids: Secondary | ICD-10-CM | POA: Diagnosis not present

## 2021-01-14 DIAGNOSIS — H04123 Dry eye syndrome of bilateral lacrimal glands: Secondary | ICD-10-CM | POA: Diagnosis not present

## 2021-01-14 DIAGNOSIS — H35371 Puckering of macula, right eye: Secondary | ICD-10-CM | POA: Diagnosis not present

## 2021-01-14 DIAGNOSIS — Z961 Presence of intraocular lens: Secondary | ICD-10-CM | POA: Diagnosis not present

## 2021-01-15 ENCOUNTER — Other Ambulatory Visit: Payer: Self-pay | Admitting: Adult Health

## 2021-01-15 DIAGNOSIS — I10 Essential (primary) hypertension: Secondary | ICD-10-CM

## 2021-01-29 ENCOUNTER — Other Ambulatory Visit: Payer: Self-pay | Admitting: Family Medicine

## 2021-01-30 NOTE — Telephone Encounter (Signed)
Last refill--07/08/2020--60 tabs with 5 refills Last office visit- 12/19/2020  Next appointment-03/23/2021

## 2021-02-10 ENCOUNTER — Telehealth: Payer: Self-pay | Admitting: Internal Medicine

## 2021-02-10 MED ORDER — ESOMEPRAZOLE MAGNESIUM 40 MG PO CPDR
40.0000 mg | DELAYED_RELEASE_CAPSULE | Freq: Every day | ORAL | 0 refills | Status: DC
Start: 2021-02-10 — End: 2021-10-16

## 2021-02-10 NOTE — Telephone Encounter (Signed)
Patient requesting refill on Nexium 

## 2021-02-10 NOTE — Telephone Encounter (Signed)
Rx sent. Needs office visit for further refills. 

## 2021-02-17 ENCOUNTER — Other Ambulatory Visit: Payer: Self-pay | Admitting: Family Medicine

## 2021-02-28 ENCOUNTER — Encounter (HOSPITAL_COMMUNITY): Payer: Self-pay | Admitting: Emergency Medicine

## 2021-02-28 ENCOUNTER — Emergency Department (HOSPITAL_COMMUNITY)
Admission: EM | Admit: 2021-02-28 | Discharge: 2021-02-28 | Disposition: A | Discharge status: Home / Self Care | Payer: Medicare Other | Attending: Emergency Medicine | Admitting: Emergency Medicine

## 2021-02-28 ENCOUNTER — Other Ambulatory Visit: Payer: Self-pay

## 2021-02-28 DIAGNOSIS — Z79899 Other long term (current) drug therapy: Secondary | ICD-10-CM | POA: Insufficient documentation

## 2021-02-28 DIAGNOSIS — H538 Other visual disturbances: Secondary | ICD-10-CM | POA: Diagnosis not present

## 2021-02-28 DIAGNOSIS — H2000 Unspecified acute and subacute iridocyclitis: Secondary | ICD-10-CM | POA: Diagnosis not present

## 2021-02-28 DIAGNOSIS — G20 Parkinson's disease: Secondary | ICD-10-CM | POA: Diagnosis not present

## 2021-02-28 DIAGNOSIS — F0281 Dementia in other diseases classified elsewhere with behavioral disturbance: Secondary | ICD-10-CM | POA: Diagnosis not present

## 2021-02-28 DIAGNOSIS — H5711 Ocular pain, right eye: Secondary | ICD-10-CM | POA: Insufficient documentation

## 2021-02-28 DIAGNOSIS — I10 Essential (primary) hypertension: Secondary | ICD-10-CM | POA: Insufficient documentation

## 2021-02-28 DIAGNOSIS — H6691 Otitis media, unspecified, right ear: Secondary | ICD-10-CM | POA: Diagnosis not present

## 2021-02-28 DIAGNOSIS — H15001 Unspecified scleritis, right eye: Secondary | ICD-10-CM | POA: Diagnosis not present

## 2021-02-28 MED ORDER — FLUORESCEIN SODIUM 1 MG OP STRP
1.0000 | ORAL_STRIP | Freq: Once | OPHTHALMIC | Status: AC
  Administered 2021-02-28: 1 via OPHTHALMIC
  Filled 2021-02-28: qty 1

## 2021-02-28 MED ORDER — TETRACAINE HCL 0.5 % OP SOLN
2.0000 [drp] | Freq: Once | OPHTHALMIC | Status: AC
  Administered 2021-02-28: 2 [drp] via OPHTHALMIC
  Filled 2021-02-28: qty 4

## 2021-02-28 NOTE — ED Triage Notes (Addendum)
Patient reports swelling and pain to right eye x2 days spreading to left eye. Right eye swollen closed. Cataract and glaucoma surgery x6 months ago without complications. Denies injury and allergies.

## 2021-02-28 NOTE — ED Provider Notes (Signed)
[facility] DEPT Provider Note   CSN: [id number] Arrival date & time: 02/28/21  1043     History Chief Complaint  Patient presents with  . Facial Swelling    Audrey Brown is a 75 y.o. female presents to the ED for evaluation of sudden onset right eye pain associated with redness, tearing, blurred vision 2 days ago.  Patient keeps her eyes closed because she also has photosensitivity.  Denies any trauma.  Does not wear any contacts.  Wears readers.  Reports pain around her cheekbone as well.  No interventions.  Reports November 2021 she had glaucoma surgery in this eye.  February 2022 she had cataract surgery in this eye as well.  Denies any severe headache, nausea, vomiting.  HPI     Past Medical History:  Diagnosis Date  . Arthritis    lower back  . Bipolar disorder ([place])   . Depression   . GERD (gastroesophageal reflux disease)   . Glaucoma    right eye  . Hiatal hernia   . Loss of vision    right eye  . Parkinson disease ([place])   . Poor short-term memory   . Sagittal band rupture at metacarpophalangeal joint 11/2016   right long finger    Patient Active Problem List   Diagnosis Date Noted  . HTN (hypertension) 12/23/2020  . Pain in right knee 11/22/2018  . Other constipation 09/21/2018  . Delayed gastric emptying 09/14/2018  . Insomnia 06/09/2017  . Chronic mid back pain 08/14/2014  . Dementia in corticobasal degeneration ([place]) 04/10/2014  . Abnormality of gait 04/10/2014  . Lumbosacral spondylosis without myelopathy 04/10/2014  . Dysphagia 06/16/2011  . GERD with stricture 06/16/2011  . Bipolar 1 disorder, mixed ([place]) 06/16/2011  . Parkinsonian syndrome ([place]) 06/16/2011  . Closed fracture of lumbar vertebra ([place]) 11/26/2010  . PARKINSONISM 11/13/2010  . TINEA CORPORIS 07/31/2010  . CELLULITIS, LEGS 07/31/2010  . RESTING TREMOR 07/31/2010  . DEPENDENT EDEMA 06/25/2009  . GERD 11/19/2008  . WEAKNESS 11/19/2008  .  Depression 01/02/2008  . HEADACHE 01/02/2008  . CHICKENPOX, HX OF 01/02/2008    Past Surgical History:  Procedure Laterality Date  . ANKLE SURGERY    . BALLOON DILATION N/A 10/29/2017   Procedure: BALLOON DILATION;  Surgeon: Jerene Bears, MD;  Location: [facility];  Service: Gastroenterology;  Laterality: N/A;  . CATARACT EXTRACTION Right 08/07/2020  . [place]  . CESAREAN SECTION    . CHOLECYSTECTOMY    . CYST REMOVAL NECK    . ESOPHAGEAL DILATION  12/19/2018   Procedure: ESOPHAGEAL DILATION;  Surgeon: Jerene Bears, MD;  Location: [facility];  Service: Gastroenterology;;  . ESOPHAGOGASTRODUODENOSCOPY (EGD) WITH ESOPHAGEAL DILATION  07/27/2012   with Propofol  . ESOPHAGOGASTRODUODENOSCOPY (EGD) WITH PROPOFOL N/A 06/23/2016   Procedure: ESOPHAGOGASTRODUODENOSCOPY (EGD) WITH PROPOFOL;  Surgeon: Jerene Bears, MD;  Location: [facility];  Service: Gastroenterology;  Laterality: N/A;  . ESOPHAGOGASTRODUODENOSCOPY (EGD) WITH PROPOFOL N/A 10/29/2017   Procedure: ESOPHAGOGASTRODUODENOSCOPY (EGD) WITH PROPOFOL;  Surgeon: Jerene Bears, MD;  Location: [facility];  Service: Gastroenterology;  Laterality: N/A;  . ESOPHAGOGASTRODUODENOSCOPY (EGD) WITH PROPOFOL N/A 12/19/2018   Procedure: ESOPHAGOGASTRODUODENOSCOPY (EGD) WITH PROPOFOL;  Surgeon: Jerene Bears, MD;  Location: [facility];  Service: Gastroenterology;  Laterality: N/A;  . EYE SURGERY    . EYE SURGERY  09/2019   R eye procedure to relieve pressure  . EYE SURGERY    . FOOT NEUROMA SURGERY Left   .  ORIF DISTAL RADIUS FRACTURE Right 07/04/2010  . SAVORY DILATION N/A 06/23/2016   Procedure: SAVORY DILATION;  Surgeon: Beverley Fiedler, MD;  Location: [facility];  Service: Gastroenterology;  Laterality: N/A;  . TENDON REPAIR Right 12/01/2016   Procedure: right long finger ulnar sagittal band reconstruction;  Surgeon: Betha Loa, MD;  Location: [facility];  Service: Orthopedics;  Laterality: Right;  .  TONSILLECTOMY       OB History   No obstetric history on file.     Family History  Problem Relation Age of Onset  . Emphysema Mother   . Heart attack Father   . Hypertension Other   . Lung cancer Other   . Colon cancer Neg Hx   . Esophageal cancer Neg Hx   . Rectal cancer Neg Hx   . Stomach cancer Neg Hx     Social History   Tobacco Use  . Smoking status: Never Smoker  . Smokeless tobacco: Never Used  Vaping Use  . Vaping Use: Never used  Substance Use Topics  . Alcohol use: No    Alcohol/week: 0.0 standard drinks  . Drug use: No    Home Medications Prior to Admission medications   Medication Sig Start Date End Date Taking? Authorizing Provider  traMADol (ULTRAM) 50 MG tablet TAKE 2 TABLETS BY MOUTH EVERY 8 HOURS AS NEEDED FOR MODERATE PAIN 01/30/21   [name], MD  ALPRAZolam Prudy Feeler) 0.25 MG tablet TAKE 1 TABLET THREE TIMES A DAY AS NEEDED FOR ANXIETY 11/04/20   [name], MD  AMBULATORY NON FORMULARY MEDICATION Take 10 mg by mouth 3 (three) times daily. Medication Name: Domperidone 10 mg 12/11/20   Beverley Fiedler, MD  atropine 1 % ophthalmic solution  10/20/19   [provider]  azithromycin (ZITHROMAX) 250 MG tablet  12/27/19   [provider]  buPROPion (WELLBUTRIN XL) 300 MG 24 hr tablet Take 300 mg by mouth daily.  03/27/16   [provider]  busPIRone (BUSPAR) 10 MG tablet Take 10 mg by mouth 2 (two) times daily.     [provider]  Calcium Carb-Cholecalciferol 500-100 MG-UNIT CHEW Chew by mouth.    [provider]  calcium-vitamin D (OSCAL WITH D) 500-200 MG-UNIT tablet Take 1 tablet by mouth 2 (two) times daily.    [provider]  cyclobenzaprine (FLEXERIL) 10 MG tablet TAKE 1 TABLET THREE TIMES A DAY AS NEEDED FOR MUSCLE SPASMS 02/17/21   [name], MD  esomeprazole (NEXIUM) 40 MG capsule Take 1 capsule (40 mg total) by mouth daily. MUST HAVE OFFICE VISIT FOR FURTHER REFILLS 02/10/21   Pyrtle, Carie Caddy,  MD  eszopiclone (LUNESTA) 1 MG TABS tablet Take 1 tablet (1 mg total) by mouth at bedtime as needed for sleep. Take immediately before bedtime 12/23/20   [name], MD  furosemide (LASIX) 20 MG tablet TAKE 1 TABLET DAILY AS NEEDED FOR FLUID Patient not taking: Reported on 12/23/2020 09/30/20   [name], MD  HYDROcodone-homatropine [facility]) 5-1.5 MG/5ML syrup Take 5 mLs by mouth every 4 (four) hours as needed for cough. 12/10/20   [name], MD  ibuprofen (ADVIL) 800 MG tablet TAKE 1 TABLET EVERY 8 HOURS AS NEEDED FOR PAIN 03/12/20   [name], MD  LamoTRIgine XR (LAMICTAL XR) 200 MG TB24 Take 1 tablet (200 mg total) by mouth daily. 12/15/13   [name], MD  lisinopril (ZESTRIL) 10 MG tablet TAKE 1 TABLET(10 MG)  BY MOUTH DAILY 01/20/21   Laurey Morale, MD  Magnesium 500 MG CAPS Take 400 mg by mouth at bedtime.     [provider]  Melatonin 5 MG CAPS Take 5 mg by mouth at bedtime.    [provider]  OSCIMIN 0.125 MG SUBL PLACE 1 TABLET UNDER THE TONGUE EVERY 6 HOURS AS NEEDED FOR CRAMPING Patient taking differently: Place 0.125 mg under the tongue every 6 (six) hours as needed (cramping). 02/09/17   Pyrtle, Lajuan Lines, MD  prednisoLONE acetate (PRED FORTE) 1 % ophthalmic suspension  10/06/19   [provider]  promethazine (PHENERGAN) 25 MG tablet Take 1 tablet (25 mg total) by mouth every 4 (four) hours as needed for nausea. 04/21/17   Laurey Morale, MD  propranolol (INDERAL) 10 MG tablet Take 10 mg by mouth 2 (two) times daily. 05/20/19   [provider]  temazepam (RESTORIL) 30 MG capsule Take 30 mg by mouth at bedtime. Patient not taking: Reported on 12/23/2020    [provider]  verapamil (CALAN) 80 MG tablet TAKE 1 TABLET THREE TIMES A DAY Patient not taking: No sig reported 04/19/19   Pieter Partridge, DO    Allergies    Celebrex [celecoxib], Morphine and related, Penicillins, Risperdal [risperidone], and Statins  Review of Systems    Review of Systems  Eyes: Positive for photophobia, pain, discharge, redness and visual disturbance.  All other systems reviewed and are negative.   Physical Exam Updated Vital Signs BP (!) 152/105   Pulse 87   Temp 98.1 F (36.7 C) (Oral)   Resp 20   SpO2 92%   Physical Exam Constitutional:      Appearance: She is well-developed.  HENT:     Head: Normocephalic.     Nose: Nose normal.  Eyes:     General: Lids are normal.     Conjunctiva/sclera:     Right eye: Right conjunctiva is injected.     Pupils:     Right eye: Pupil is sluggish.     Comments:  RIGHT LEFT: PERRL. No consensual or direct photosensitivity. EOMs intact, painless. Upper/lower lids without erythema, edema, tenderness, warmth, palpable mass, lesions.  Conjunctiva and sclera white without prominent vessels.  Visual acuity 20/50.   RIGHT EYE: PERRL.  Pupil larger than left, sluggish. +Direct and consensual photosensitivity. EOMs intact, painless. Upper/lower lids without erythema, edema, tenderness, warmth, palpable mass, lesions.  Conjunctiva and sclera injected with prominent vessels.  ?limbic flush.  IOP 9, 11, 9.  Fluorescein uptake: none. Visual acuity: 20/200.  No cell or flare on slit lamp exam.   Cardiovascular:     Rate and Rhythm: Normal rate.  Pulmonary:     Effort: Pulmonary effort is normal. No respiratory distress.  Musculoskeletal:        General: Normal range of motion.     Cervical back: Normal range of motion.  Neurological:     Mental Status: She is alert.  Psychiatric:        Behavior: Behavior normal.     ED Results / Procedures / Treatments   Labs (all labs ordered are listed, but only abnormal results are displayed) Labs Reviewed - No data to display  EKG None  Radiology No results found.  Procedures Procedures   Medications Ordered in ED Medications  fluorescein ophthalmic strip 1 strip (1 strip Both Eyes Given by Other 02/28/21 1246)  tetracaine (PONTOCAINE) 0.5 %  ophthalmic solution 2 drop (2 drops Both Eyes Given  by Other 02/28/21 1246)    ED Course  I have reviewed the triage vital signs and the nursing notes.  Pertinent labs & imaging results that were available during my care of the patient were reviewed by me and considered in my medical decision making (see chart for details).    MDM Rules/Calculators/A&P                          75 yo F here with sudden onset right eye pain, redness, tearing, blurred vision. Some radiating discomfort on temporal area but seems to be deferred pain because this actually improved after tetracaine administration in the eye.  IOP ~10, visual fields intact, 20/200 visual acuity and EOMS intact without pain. High on ddx is uveitis/iritis. No corneal abrasion on exam. Other considerations include temporal arteritis although less likely. Clinically this is less consistent with CRAO, CRVO, retinal detachment, cellulitis, uveitis/iritis, conjunctivitis.  Spoke to Dr Manuella Ghazi (ophto) asks patient to be sent to his office for further evaluation.    Final Clinical Impression(s) / ED Diagnoses Final diagnoses:  Acute right eye pain    Rx / DC Orders ED Discharge Orders    None       Kinnie Feil, PA-C 02/28/21 1401    Pattricia Boss, MD 03/03/21 1346

## 2021-02-28 NOTE — Discharge Instructions (Addendum)
Go directly to Dr Trena Platt office for further evaluation   Let them know you were seen in the ER and he asked for you to come to his office

## 2021-03-05 DIAGNOSIS — H4020X0 Unspecified primary angle-closure glaucoma, stage unspecified: Secondary | ICD-10-CM | POA: Diagnosis not present

## 2021-03-05 DIAGNOSIS — H35371 Puckering of macula, right eye: Secondary | ICD-10-CM | POA: Diagnosis not present

## 2021-03-05 DIAGNOSIS — H40211 Acute angle-closure glaucoma, right eye: Secondary | ICD-10-CM | POA: Diagnosis not present

## 2021-03-05 DIAGNOSIS — Z9841 Cataract extraction status, right eye: Secondary | ICD-10-CM | POA: Diagnosis not present

## 2021-03-05 DIAGNOSIS — H0288A Meibomian gland dysfunction right eye, upper and lower eyelids: Secondary | ICD-10-CM | POA: Diagnosis not present

## 2021-03-05 DIAGNOSIS — H0288B Meibomian gland dysfunction left eye, upper and lower eyelids: Secondary | ICD-10-CM | POA: Diagnosis not present

## 2021-03-05 DIAGNOSIS — Z961 Presence of intraocular lens: Secondary | ICD-10-CM | POA: Diagnosis not present

## 2021-03-05 DIAGNOSIS — H04123 Dry eye syndrome of bilateral lacrimal glands: Secondary | ICD-10-CM | POA: Diagnosis not present

## 2021-03-05 DIAGNOSIS — H40032 Anatomical narrow angle, left eye: Secondary | ICD-10-CM | POA: Diagnosis not present

## 2021-03-05 DIAGNOSIS — H209 Unspecified iridocyclitis: Secondary | ICD-10-CM | POA: Diagnosis not present

## 2021-03-07 ENCOUNTER — Other Ambulatory Visit: Payer: Self-pay | Admitting: Family Medicine

## 2021-03-13 ENCOUNTER — Encounter: Payer: Self-pay | Admitting: Physician Assistant

## 2021-03-13 ENCOUNTER — Ambulatory Visit (INDEPENDENT_AMBULATORY_CARE_PROVIDER_SITE_OTHER): Payer: Medicare Other | Admitting: Physician Assistant

## 2021-03-13 ENCOUNTER — Other Ambulatory Visit: Payer: Self-pay

## 2021-03-13 VITALS — BP 128/94 | HR 84

## 2021-03-13 DIAGNOSIS — K219 Gastro-esophageal reflux disease without esophagitis: Secondary | ICD-10-CM

## 2021-03-13 DIAGNOSIS — R059 Cough, unspecified: Secondary | ICD-10-CM | POA: Diagnosis not present

## 2021-03-13 DIAGNOSIS — R1319 Other dysphagia: Secondary | ICD-10-CM

## 2021-03-13 MED ORDER — AMBULATORY NON FORMULARY MEDICATION: 10.0000 mg | Freq: Three times a day (TID) | 1 refills | Status: DC

## 2021-03-13 MED ORDER — ESOMEPRAZOLE MAGNESIUM 40 MG PO CPDR: 40.0000 mg | DELAYED_RELEASE_CAPSULE | Freq: Two times a day (BID) | ORAL | 4 refills | Status: DC

## 2021-03-13 NOTE — Patient Instructions (Addendum)
If you are age 73 or older, your body mass index should be between 23-30. Your There is no height or weight on file to calculate BMI. If this is out of the aforementioned range listed, please consider follow up with your Primary Care Provider.  If you are age 73 or younger, your body mass index should be between 19-25. Your There is no height or weight on file to calculate BMI. If this is out of the aformentioned range listed, please consider follow up with your Primary Care Provider.   You have been scheduled for a Barium Esophogram at [facility] (1st floor of the hospital) on 03-25-2021 at 10:30am. Please arrive 15 minutes prior to your appointment for registration. Make certain not to have anything to eat or drink 3 hours prior to your test. If you need to reschedule for any reason, please contact radiology at [phone] to do so.   It was a pleasure to see you today!  Thank you for trusting me with your gastrointestinal care!      __________________________________________________________________ A barium swallow is an examination that concentrates on views of the esophagus. This tends to be a double contrast exam (barium and two liquids which, when combined, create a gas to distend the wall of the oesophagus) or single contrast (non-ionic iodine based). The study is usually tailored to your symptoms so a good history is essential. Attention is paid during the study to the form, structure and configuration of the esophagus, looking for functional disorders (such as aspiration, dysphagia, achalasia, motility and reflux) EXAMINATION You may be asked to change into a gown, depending on the type of swallow being performed. A radiologist and radiographer will perform the procedure. The radiologist will advise you of the type of contrast selected for your procedure and direct you during the exam. You will be asked to stand, sit or lie in several different positions and to hold a small  amount of fluid in your mouth before being asked to swallow while the imaging is performed .In some instances you may be asked to swallow barium coated marshmallows to assess the motility of a solid food bolus. The exam can be recorded as a digital or video fluoroscopy procedure. POST PROCEDURE It will take 1-2 days for the barium to pass through your system. To facilitate this, it is important, unless otherwise directed, to increase your fluids for the next 24-48hrs and to resume your normal diet.  This test typically takes about 30 minutes to perform. __________________________________________________________________________________  It was a pleasure to see you today!  Thank you for trusting me with your gastrointestinal care!

## 2021-03-16 ENCOUNTER — Encounter: Payer: Self-pay | Admitting: Physician Assistant

## 2021-03-16 NOTE — Progress Notes (Signed)
Subjective:    Patient ID: Audrey Brown, female    DOB: Jun 28, 1946, 75 y.o.   MRN: [id number]  HPI [name] is a pleasant 75 year old white female, established with Dr. Rhea Belton who comes in today for routine follow-up, medication refills and complaints of nighttime reflux. Patient has history of hypertension, chronic GERD, delayed gastric emptying, Parkinson's disease, dementia, bipolar disorder and IBS. She has been maintained on Nexium 40 mg p.o. daily and domperidone 10 mg p.o. 3 times daily AC and takes Benefiber daily for mild constipation. She had colonoscopy in 2018 which was negative. She had EGD in June 2021 with finding of a 2 cm hiatal hernia no stricture but due to complaints of dysphagia she was dilated TTS balloon 16 to 18 mm and then 18 mm at the GE junction. Patient says that the endoscopy with dilation definitely helped her symptoms significantly for a period of months, perhaps 3-4 and then symptoms have gradually recurred.  She says she has dysphagia symptoms at least once per day primarily with solid food.  No difficulty with liquids generally.  No heartburn or indigestion during the daytime.  She has been having frequent nighttime symptoms waking with hard coughing and choking and a gagging sensation.  Per her caregiver this happens frequently and can be severe at times.  They have been trying to prop her head up at nighttime but did not have the bed elevated..  Review of Systems Pertinent positive and negative review of systems were noted in the above HPI section.  All other review of systems was otherwise negative.  Outpatient Encounter Medications as of 03/13/2021  Medication Sig  . ALPRAZolam (XANAX) 0.25 MG tablet TAKE 1 TABLET THREE TIMES A DAY AS NEEDED FOR ANXIETY  . buPROPion (WELLBUTRIN XL) 300 MG 24 hr tablet Take 300 mg by mouth daily.   . busPIRone (BUSPAR) 10 MG tablet Take 10 mg by mouth 2 (two) times daily.   . Calcium Carb-Cholecalciferol 500-100 MG-UNIT CHEW  Chew by mouth.  . calcium-vitamin D (OSCAL WITH D) 500-200 MG-UNIT tablet Take 1 tablet by mouth 2 (two) times daily.  . cyclobenzaprine (FLEXERIL) 10 MG tablet TAKE 1 TABLET THREE TIMES A DAY AS NEEDED FOR MUSCLE SPASMS  . esomeprazole (NEXIUM) 40 MG capsule Take 1 capsule (40 mg total) by mouth daily. MUST HAVE OFFICE VISIT FOR FURTHER REFILLS  . esomeprazole (NEXIUM) 40 MG capsule Take 1 capsule (40 mg total) by mouth 2 (two) times daily before a meal.  . eszopiclone (LUNESTA) 1 MG TABS tablet Take 1 tablet (1 mg total) by mouth at bedtime as needed for sleep. Take immediately before bedtime  . furosemide (LASIX) 20 MG tablet TAKE 1 TABLET DAILY AS NEEDED FOR FLUID  . ibuprofen (ADVIL) 800 MG tablet TAKE 1 TABLET EVERY 8 HOURS AS NEEDED FOR PAIN  . LamoTRIgine XR (LAMICTAL XR) 200 MG TB24 Take 1 tablet (200 mg total) by mouth daily.  Marland Kitchen lisinopril (ZESTRIL) 10 MG tablet TAKE 1 TABLET(10 MG) BY MOUTH DAILY  . Magnesium 500 MG CAPS Take 400 mg by mouth at bedtime.   . Melatonin 5 MG CAPS Take 5 mg by mouth at bedtime.  . OSCIMIN 0.125 MG SUBL PLACE 1 TABLET UNDER THE TONGUE EVERY 6 HOURS AS NEEDED FOR CRAMPING (Patient taking differently: Place 0.125 mg under the tongue every 6 (six) hours as needed (cramping).)  . prednisoLONE acetate (PRED FORTE) 1 % ophthalmic suspension   . promethazine (PHENERGAN) 25 MG tablet Take 1 tablet (  25 mg total) by mouth every 4 (four) hours as needed for nausea.  . propranolol (INDERAL) 10 MG tablet Take 10 mg by mouth 2 (two) times daily.  . temazepam (RESTORIL) 30 MG capsule Take 30 mg by mouth at bedtime.  . traMADol (ULTRAM) 50 MG tablet TAKE 2 TABLETS BY MOUTH EVERY 8 HOURS AS NEEDED FOR MODERATE PAIN  . [DISCONTINUED] AMBULATORY NON FORMULARY MEDICATION Take 10 mg by mouth 3 (three) times daily. Medication Name: Domperidone 10 mg  . AMBULATORY NON FORMULARY MEDICATION Take 10 mg by mouth 3 (three) times daily. Medication Name: Domperidone 10 mg  .  [DISCONTINUED] atropine 1 % ophthalmic solution   . [DISCONTINUED] azithromycin (ZITHROMAX) 250 MG tablet   . [DISCONTINUED] HYDROcodone-homatropine (HYCODAN) 5-1.5 MG/5ML syrup Take 5 mLs by mouth every 4 (four) hours as needed for cough.  . [DISCONTINUED] verapamil (CALAN) 80 MG tablet TAKE 1 TABLET THREE TIMES A DAY (Patient not taking: No sig reported)   No facility-administered encounter medications on file as of 03/13/2021.   Allergies  Allergen Reactions  . Celebrex [Celecoxib] Hives  . Morphine And Related Hives  . Penicillins Hives, Nausea Only and Other (See Comments)    Has patient had a PCN reaction causing immediate rash, facial/tongue/throat swelling, SOB or lightheadedness with hypotension: Yes Has patient had a PCN reaction causing severe rash involving mucus membranes or skin necrosis: Yes Has patient had a PCN reaction that required hospitalization: Yes, was already in the hospital  Has patient had a PCN reaction occurring within the last 10 years: No If all of the above answers are "NO", then may proceed with Cephalosporin use.   Marland Kitchen Risperdal [Risperidone] Other (See Comments)    Tremors and seizures  . Statins Other (See Comments)    MUSCLE ACHES   Patient Active Problem List   Diagnosis Date Noted  . HTN (hypertension) 12/23/2020  . Pain in right knee 11/22/2018  . Other constipation 09/21/2018  . Delayed gastric emptying 09/14/2018  . Insomnia 06/09/2017  . Chronic mid back pain 08/14/2014  . 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 Abnormality of gait 04/10/2014  . Lumbosacral spondylosis without myelopathy 04/10/2014  . Dysphagia 06/16/2011  . GERD with stricture 06/16/2011  . Bipolar 1 disorder, mixed ([facility]) 06/16/2011  . Parkinsonian syndrome ([facility]) 06/16/2011  . Closed fracture of lumbar vertebra ([facility]) 11/26/2010  . PARKINSONISM 11/13/2010  . TINEA CORPORIS 07/31/2010  . CELLULITIS, LEGS 07/31/2010  . RESTING TREMOR 07/31/2010  .  DEPENDENT EDEMA 06/25/2009  . GERD 11/19/2008  . WEAKNESS 11/19/2008  . Depression 01/02/2008  . HEADACHE 01/02/2008  . CHICKENPOX, HX OF 01/02/2008   Social History   Socioeconomic History  . Marital status: Widowed    Spouse name: Not on file  . Number of children: 4  . Years of education: 6 years college  . Highest education level: Master's degree (e.g., MA, MS, MEng, MEd, MSW, MBA)  Occupational History  . Occupation: Retired    Associate Professor: RETIRED    Comment: [employer]  Tobacco Use  . Smoking status: Never Smoker  . Smokeless tobacco: Never Used  Vaping Use  . Vaping Use: Never used  Substance and Sexual Activity  . Alcohol use: No    Alcohol/week: 0.0 standard drinks  . Drug use: No  . Sexual activity: Not Currently  Other Topics Concern  . Not on file  Social History Narrative   Daughter is her POA and must come to all visits in GI and [facility]  Patient is right-handed. She lives with her daughter and granddaughter in a split level home with a chair lift. Uses a power chair. She drinks 3-4 cups of coffee a day.      Has 4 daughters, 3 grand children, and 5 great-grand children.   Social Determinants of Health   Financial Resource Strain: Low Risk   . Difficulty of Paying Living Expenses: Not hard at all  Food Insecurity: No Food Insecurity  . Worried About Programme researcher, broadcasting/film/video in the Last Year: Never true  . Ran Out of Food in the Last Year: Never true  Transportation Needs: No Transportation Needs  . Lack of Transportation (Medical): No  . Lack of Transportation (Non-Medical): No  Physical Activity: Inactive  . Days of Exercise per Week: 0 days  . Minutes of Exercise per Session: 0 min  Stress: No Stress Concern Present  . Feeling of Stress : Not at all  Social Connections: Moderately Integrated  . Frequency of Communication with Friends and Family: Three times a week  . Frequency of Social Gatherings with Friends and Family: More than three times a week  .  Attends Religious Services: 1 to 4 times per year  . Active Member of Clubs or Organizations: Yes  . Attends Banker Meetings: More than 4 times per year  . Marital Status: Widowed  Intimate Partner Violence: Not At Risk  . Fear of Current or Ex-Partner: No  . Emotionally Abused: No  . Physically Abused: No  . Sexually Abused: No    Ms. Swatek's family history includes Emphysema in her mother; Heart attack in her father; Hypertension in an other family member; Lung cancer in an other family member.      Objective:    Vitals:   03/13/21 1323  BP: (!) 128/94  Pulse: 84    Physical Exam Well-developed well-nourished older white female in a wheelchair, in no acute distress accompanied by caregiver.  Height, Weight, 185   HEENT; nontraumatic normocephalic, EOMI, PE R LA, sclera anicteric. Oropharynx; not examined today Neck; supple, no JVD Cardiovascular; regular rate and rhythm with S1-S2, no murmur rub or gallop Pulmonary; Clear bilaterally Abdomen; soft, nontender, nondistended, no palpable mass or hepatosplenomegaly, bowel sounds are active Rectal; not done today Skin; benign exam, no jaundice rash or appreciable lesions Extremities; no clubbing cyanosis or edema skin warm and dry Neuro/Psych; alert and oriented x4, mild difficulty with word finding, slight tremor       Assessment & Plan:   #15 75 year old white female with Parkinson's disease, mild dementia, chronic GERD and gastroparesis. Patient has history of dysphagia and had EGD with dilation in June 2021 as outlined above.  She did have good response for a few months and then symptoms gradually recurred.  EGD was negative but empiric dilation was done. Suspect symptoms are secondary to dysmotility and/or component of neurogenic with underlying Parkinson's  #2  Nocturnal reflux with frequent episodes of coughing and choking #3 underlying gastroparesis-stable on domperidone 10 mg 3 times daily #4  hypertension 5.  Bipolar disorder #6 colon cancer surveillance-up-to-date with negative colonoscopy 2018  Plan;  will schedule for barium swallow with a tablet. Increase Nexium to 40 mg p.o. twice daily AC breakfast and AC dinner.  She had previously been on a twice daily dosing and her caregivers feels that she had less nocturnal symptoms at that time. Advised n.p.o. for 3 hours prior to bedtime and elevation of her back 45 degrees while sleeping Continue domperidone  10 mg p.o. 3 times daily AC. Further recommendations pending findings of barium swallow and response to increasing Nexium    Sumayya Muha S Laterrian Hevener PA-C 03/16/2021   Cc: [name], MD

## 2021-03-21 DIAGNOSIS — Z79899 Other long term (current) drug therapy: Secondary | ICD-10-CM | POA: Diagnosis not present

## 2021-03-21 DIAGNOSIS — Z7952 Long term (current) use of systemic steroids: Secondary | ICD-10-CM | POA: Diagnosis not present

## 2021-03-21 DIAGNOSIS — H40032 Anatomical narrow angle, left eye: Secondary | ICD-10-CM | POA: Diagnosis not present

## 2021-03-21 DIAGNOSIS — H35371 Puckering of macula, right eye: Secondary | ICD-10-CM | POA: Diagnosis not present

## 2021-03-21 DIAGNOSIS — H40211 Acute angle-closure glaucoma, right eye: Secondary | ICD-10-CM | POA: Diagnosis not present

## 2021-03-21 DIAGNOSIS — H04123 Dry eye syndrome of bilateral lacrimal glands: Secondary | ICD-10-CM | POA: Diagnosis not present

## 2021-03-21 DIAGNOSIS — H209 Unspecified iridocyclitis: Secondary | ICD-10-CM | POA: Diagnosis not present

## 2021-03-21 DIAGNOSIS — H0288B Meibomian gland dysfunction left eye, upper and lower eyelids: Secondary | ICD-10-CM | POA: Diagnosis not present

## 2021-03-21 DIAGNOSIS — H0288A Meibomian gland dysfunction right eye, upper and lower eyelids: Secondary | ICD-10-CM | POA: Diagnosis not present

## 2021-03-24 ENCOUNTER — Ambulatory Visit (INDEPENDENT_AMBULATORY_CARE_PROVIDER_SITE_OTHER): Payer: Medicare Other | Admitting: Family Medicine

## 2021-03-24 ENCOUNTER — Encounter: Payer: Self-pay | Admitting: Family Medicine

## 2021-03-24 VITALS — BP 142/96 | HR 66 | Temp 98.5°F | Wt 185.0 lb

## 2021-03-24 DIAGNOSIS — I10 Essential (primary) hypertension: Secondary | ICD-10-CM

## 2021-03-24 MED ORDER — LISINOPRIL 20 MG PO TABS: 20.0000 mg | ORAL_TABLET | Freq: Every day | ORAL | 3 refills | Status: DC

## 2021-03-24 NOTE — Progress Notes (Signed)
   Subjective:    Patient ID: Audrey Brown, female    DOB: 02-24-1946, 75 y.o.   MRN: [id number]  HPI Here for elevated BP readings for the past month. She has been getting readings in the 150s over 90 s at home. She feels well except for a case of iritis in the right eye. She is using steroid drops to treat this.    Review of Systems  Constitutional: Negative.   Respiratory: Negative.   Cardiovascular: Negative.        Objective:   Physical Exam Constitutional:      Appearance: Normal appearance.  Cardiovascular:     Rate and Rhythm: Normal rate and regular rhythm.     Pulses: Normal pulses.     Heart sounds: Normal heart sounds.  Pulmonary:     Effort: Pulmonary effort is normal.     Breath sounds: Normal breath sounds.  Musculoskeletal:     Right lower leg: No edema.     Left lower leg: No edema.  Neurological:     Mental Status: She is alert.           Assessment & Plan:  HTN. We will increase the Lisinopril to 20 mg daily. Report back in a few weeks.  Alysia Penna, MD

## 2021-03-25 ENCOUNTER — Other Ambulatory Visit: Payer: Self-pay

## 2021-03-25 ENCOUNTER — Ambulatory Visit (HOSPITAL_COMMUNITY)
Admission: RE | Admit: 2021-03-25 | Discharge: 2021-03-25 | Disposition: A | Discharge status: Home / Self Care | Payer: Medicare Other | Source: Ambulatory Visit | Attending: Physician Assistant | Admitting: Physician Assistant

## 2021-03-25 ENCOUNTER — Other Ambulatory Visit: Payer: Self-pay | Admitting: Physician Assistant

## 2021-03-25 DIAGNOSIS — K219 Gastro-esophageal reflux disease without esophagitis: Secondary | ICD-10-CM | POA: Diagnosis not present

## 2021-03-25 DIAGNOSIS — R1319 Other dysphagia: Secondary | ICD-10-CM | POA: Diagnosis not present

## 2021-03-30 ENCOUNTER — Other Ambulatory Visit: Payer: Self-pay

## 2021-03-30 ENCOUNTER — Emergency Department (HOSPITAL_COMMUNITY): Payer: Medicare Other

## 2021-03-30 ENCOUNTER — Encounter (HOSPITAL_COMMUNITY): Payer: Self-pay | Admitting: Emergency Medicine

## 2021-03-30 ENCOUNTER — Emergency Department (HOSPITAL_COMMUNITY)
Admission: EM | Admit: 2021-03-30 | Discharge: 2021-03-30 | Disposition: A | Discharge status: Home / Self Care | Payer: Medicare Other | Attending: Emergency Medicine | Admitting: Emergency Medicine

## 2021-03-30 DIAGNOSIS — Z79899 Other long term (current) drug therapy: Secondary | ICD-10-CM | POA: Insufficient documentation

## 2021-03-30 DIAGNOSIS — R1031 Right lower quadrant pain: Secondary | ICD-10-CM

## 2021-03-30 DIAGNOSIS — I10 Essential (primary) hypertension: Secondary | ICD-10-CM | POA: Insufficient documentation

## 2021-03-30 DIAGNOSIS — G3185 Corticobasal degeneration: Secondary | ICD-10-CM | POA: Insufficient documentation

## 2021-03-30 DIAGNOSIS — D259 Leiomyoma of uterus, unspecified: Secondary | ICD-10-CM | POA: Diagnosis not present

## 2021-03-30 DIAGNOSIS — K5900 Constipation, unspecified: Secondary | ICD-10-CM | POA: Insufficient documentation

## 2021-03-30 DIAGNOSIS — G20 Parkinson's disease: Secondary | ICD-10-CM | POA: Insufficient documentation

## 2021-03-30 DIAGNOSIS — Z8719 Personal history of other diseases of the digestive system: Secondary | ICD-10-CM | POA: Diagnosis not present

## 2021-03-30 DIAGNOSIS — Z9049 Acquired absence of other specified parts of digestive tract: Secondary | ICD-10-CM | POA: Diagnosis not present

## 2021-03-30 DIAGNOSIS — K5750 Diverticulosis of both small and large intestine without perforation or abscess without bleeding: Secondary | ICD-10-CM | POA: Diagnosis not present

## 2021-03-30 DIAGNOSIS — I700 Atherosclerosis of aorta: Secondary | ICD-10-CM | POA: Diagnosis not present

## 2021-03-30 LAB — URINALYSIS, ROUTINE W REFLEX MICROSCOPIC
Bacteria, UA: NONE SEEN
Bilirubin Urine: NEGATIVE
Glucose, UA: NEGATIVE mg/dL
Hgb urine dipstick: NEGATIVE
Ketones, ur: NEGATIVE mg/dL
Nitrite: NEGATIVE
Protein, ur: 30 mg/dL — AB
Specific Gravity, Urine: 1.009 (ref 1.005–1.030)
WBC, UA: >50 WBC/hpf — ABNORMAL HIGH (ref 0–5)
pH: 5 (ref 5.0–8.0)

## 2021-03-30 LAB — COMPREHENSIVE METABOLIC PANEL
ALT: 13 U/L (ref 0–44)
AST: 13 U/L — ABNORMAL LOW (ref 15–41)
Albumin: 3.8 g/dL (ref 3.5–5.0)
Alkaline Phosphatase: 69 U/L (ref 38–126)
Anion gap: 9 (ref 5–15)
BUN: 11 mg/dL (ref 8–23)
CO2: 24 mmol/L (ref 22–32)
Calcium: 9.3 mg/dL (ref 8.9–10.3)
Chloride: 106 mmol/L (ref 98–111)
Creatinine, Ser: 0.85 mg/dL (ref 0.44–1.00)
GFR, Estimated: >60 mL/min (ref >60)
Glucose, Bld: 103 mg/dL — ABNORMAL HIGH (ref 70–99)
Potassium: 4 mmol/L (ref 3.5–5.1)
Sodium: 139 mmol/L (ref 135–145)
Total Bilirubin: 0.4 mg/dL (ref 0.3–1.2)
Total Protein: 7.1 g/dL (ref 6.5–8.1)

## 2021-03-30 LAB — CBC WITH DIFFERENTIAL/PLATELET
Abs Immature Granulocytes: 0.03 10*3/uL (ref 0.00–0.07)
Basophils Absolute: 0.1 10*3/uL (ref 0.0–0.1)
Basophils Relative: 1 %
Eosinophils Absolute: 0.2 10*3/uL (ref 0.0–0.5)
Eosinophils Relative: 2 %
HCT: 43.5 % (ref 36.0–46.0)
Hemoglobin: 14.2 g/dL (ref 12.0–15.0)
Immature Granulocytes: 0 %
Lymphocytes Relative: 21 %
Lymphs Abs: 2 10*3/uL (ref 0.7–4.0)
MCH: 29.3 pg (ref 26.0–34.0)
MCHC: 32.6 g/dL (ref 30.0–36.0)
MCV: 89.7 fL (ref 80.0–100.0)
Monocytes Absolute: 0.6 10*3/uL (ref 0.1–1.0)
Monocytes Relative: 6 %
Neutro Abs: 6.5 10*3/uL (ref 1.7–7.7)
Neutrophils Relative %: 70 %
Platelets: 382 10*3/uL (ref 150–400)
RBC: 4.85 MIL/uL (ref 3.87–5.11)
RDW: 12.8 % (ref 11.5–15.5)
WBC: 9.3 10*3/uL (ref 4.0–10.5)
nRBC: 0 % (ref 0.0–0.2)

## 2021-03-30 LAB — LIPASE, BLOOD: Lipase: 21 U/L (ref 11–51)

## 2021-03-30 MED ORDER — PANTOPRAZOLE SODIUM 40 MG IV SOLR
40.0000 mg | Freq: Once | INTRAVENOUS | Status: AC
  Administered 2021-03-30: 40 mg via INTRAVENOUS
  Filled 2021-03-30: qty 40

## 2021-03-30 MED ORDER — LORAZEPAM 2 MG/ML IJ SOLN
0.5000 mg | Freq: Once | INTRAMUSCULAR | Status: AC
  Administered 2021-03-30: 0.5 mg via INTRAVENOUS
  Filled 2021-03-30: qty 1

## 2021-03-30 MED ORDER — LACTATED RINGERS IV SOLN: INTRAVENOUS | Status: DC

## 2021-03-30 MED ORDER — ONDANSETRON HCL 4 MG/2ML IJ SOLN
4.0000 mg | Freq: Once | INTRAMUSCULAR | Status: AC
  Administered 2021-03-30: 4 mg via INTRAVENOUS
  Filled 2021-03-30: qty 2

## 2021-03-30 NOTE — ED Provider Notes (Signed)
Emergency Medicine Provider Triage Evaluation Note  Audrey Brown , a 75 y.o. female  was evaluated in triage.  Pt complains of nausea (dry heaves) and RLQ abdominal pain x 3 days, non bloody. Decreased PO intake. No history of similar previously. Abdominal surgeries include c-section.  Review of Systems  Positive: Abdominal pain  Negative: Vomiting, fever  Physical Exam  BP (!) 172/99   Pulse 89   Temp 98.3 F (36.8 C) (Oral)   Resp 16   Ht 5\' 3"  (1.6 m)   Wt 81.6 kg   SpO2 97%   BMI 31.89 kg/m  Gen:   Awake, no distress   Resp:  Normal effort  MSK:   Moves extremities without difficulty  Other:    Medical Decision Making  Medically screening exam initiated at 12:07 PM.  Appropriate orders placed.  JESSIE COWHER was informed that the remainder of the evaluation will be completed by another provider, this initial triage assessment does not replace that evaluation, and the importance of remaining in the ED until their evaluation is complete.     Tacy Learn, PA-C 03/30/21 1209    Daleen Bo, MD 03/30/21 1800

## 2021-03-30 NOTE — ED Triage Notes (Signed)
Patient reports nausea and lower abdominal pain, dry heaves, hx of gastroparesis. Last BM was 2 days ago, normally goes multiple times a day.

## 2021-03-30 NOTE — ED Provider Notes (Signed)
[facility] DEPT Provider Note   CSN: [id number] Arrival date & time: 03/30/21  1109     History Chief Complaint  Patient presents with  . Abdominal Pain  . Constipation    Audrey Brown is a 75 y.o. female.  HPI Patient reports he does have history of gastroparesis but has never had pain similar to what she is experiencing at this time.  Reports initially she had diffuse discomfort in the upper abdomen and indicates epigastric region.  He was having significant nausea and early satiety.  Pain is migrated down to the right lower quadrant.  She reports is very uncomfortable.  She reports she has not actually vomited but can only tolerate liquids.  She is not eating any solids.  She does not typically have constipation.  She reports her last normal bowel movement was 2 days ago.  No pain burning urgency with urination.  No fevers no chills.    Past Medical History:  Diagnosis Date  . Arthritis    lower back  . Bipolar disorder ([place])   . Depression   . GERD (gastroesophageal reflux disease)   . Glaucoma    bilateral  . Hiatal hernia   . Loss of vision    right eye  . Parkinson disease ([place])   . Poor short-term memory   . Sagittal band rupture at metacarpophalangeal joint 11/2016   right long finger    Patient Active Problem List   Diagnosis Date Noted  . HTN (hypertension) 12/23/2020  . Pain in right knee 11/22/2018  . Other constipation 09/21/2018  . Delayed gastric emptying 09/14/2018  . Insomnia 06/09/2017  . Chronic mid back pain 08/14/2014  . 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 Abnormality of gait 04/10/2014  . Lumbosacral spondylosis without myelopathy 04/10/2014  . Dysphagia 06/16/2011  . GERD with stricture 06/16/2011  . Bipolar 1 disorder, mixed ([place]) 06/16/2011  . Parkinsonian syndrome ([place]) 06/16/2011  . Closed fracture of lumbar vertebra ([place]) 11/26/2010  . PARKINSONISM 11/13/2010  . TINEA CORPORIS  07/31/2010  . CELLULITIS, LEGS 07/31/2010  . RESTING TREMOR 07/31/2010  . DEPENDENT EDEMA 06/25/2009  . GERD 11/19/2008  . WEAKNESS 11/19/2008  . Depression 01/02/2008  . HEADACHE 01/02/2008  . CHICKENPOX, HX OF 01/02/2008    Past Surgical History:  Procedure Laterality Date  . ANKLE SURGERY    . BALLOON DILATION N/A 10/29/2017   Procedure: BALLOON DILATION;  Surgeon: Jerene Bears, MD;  Location: [facility];  Service: Gastroenterology;  Laterality: N/A;  . CATARACT EXTRACTION Right 08/07/2020  . [place]  . CESAREAN SECTION    . CHOLECYSTECTOMY    . CYST REMOVAL NECK    . ESOPHAGEAL DILATION  12/19/2018   Procedure: ESOPHAGEAL DILATION;  Surgeon: Jerene Bears, MD;  Location: [facility];  Service: Gastroenterology;;  . ESOPHAGOGASTRODUODENOSCOPY (EGD) WITH ESOPHAGEAL DILATION  07/27/2012   with Propofol  . ESOPHAGOGASTRODUODENOSCOPY (EGD) WITH PROPOFOL N/A 06/23/2016   Procedure: ESOPHAGOGASTRODUODENOSCOPY (EGD) WITH PROPOFOL;  Surgeon: Jerene Bears, MD;  Location: [facility];  Service: Gastroenterology;  Laterality: N/A;  . ESOPHAGOGASTRODUODENOSCOPY (EGD) WITH PROPOFOL N/A 10/29/2017   Procedure: ESOPHAGOGASTRODUODENOSCOPY (EGD) WITH PROPOFOL;  Surgeon: Jerene Bears, MD;  Location: [facility];  Service: Gastroenterology;  Laterality: N/A;  . ESOPHAGOGASTRODUODENOSCOPY (EGD) WITH PROPOFOL N/A 12/19/2018   Procedure: ESOPHAGOGASTRODUODENOSCOPY (EGD) WITH PROPOFOL;  Surgeon: Jerene Bears, MD;  Location: [facility];  Service: Gastroenterology;  Laterality: N/A;  . EYE SURGERY    .  EYE SURGERY  09/2019   R eye procedure to relieve pressure  . EYE SURGERY    . FOOT NEUROMA SURGERY Left   . ORIF DISTAL RADIUS FRACTURE Right 07/04/2010  . SAVORY DILATION N/A 06/23/2016   Procedure: SAVORY DILATION;  Surgeon: Jerene Bears, MD;  Location: [facility];  Service: Gastroenterology;  Laterality: N/A;  . TENDON REPAIR Right 12/01/2016   Procedure: right long  finger ulnar sagittal band reconstruction;  Surgeon: Leanora Cover, MD;  Location: [facility];  Service: Orthopedics;  Laterality: Right;  . TONSILLECTOMY       OB History   No obstetric history on file.     Family History  Problem Relation Age of Onset  . Emphysema Mother   . Heart attack Father   . Hypertension Other   . Lung cancer Other   . Colon cancer Neg Hx   . Esophageal cancer Neg Hx   . Rectal cancer Neg Hx   . Stomach cancer Neg Hx     Social History   Tobacco Use  . Smoking status: Never Smoker  . Smokeless tobacco: Never Used  Vaping Use  . Vaping Use: Never used  Substance Use Topics  . Alcohol use: No    Alcohol/week: 0.0 standard drinks  . Drug use: No    Home Medications Prior to Admission medications   Medication Sig Start Date End Date Taking? Authorizing Provider  ALPRAZolam Duanne Moron) 0.25 MG tablet TAKE 1 TABLET THREE TIMES A DAY AS NEEDED FOR ANXIETY 11/04/20   Laurey Morale, MD  AMBULATORY NON FORMULARY MEDICATION Take 10 mg by mouth 3 (three) times daily. Medication Name: Domperidone 10 mg 03/13/21   Esterwood, Amy S, PA-C  buPROPion (WELLBUTRIN XL) 300 MG 24 hr tablet Take 300 mg by mouth daily.  03/27/16   [provider]  busPIRone (BUSPAR) 10 MG tablet Take 10 mg by mouth 2 (two) times daily.     [provider]  Calcium Carb-Cholecalciferol 500-100 MG-UNIT CHEW Chew by mouth.    [provider]  calcium-vitamin D (OSCAL WITH D) 500-200 MG-UNIT tablet Take 1 tablet by mouth 2 (two) times daily.    [provider]  cyclobenzaprine (FLEXERIL) 10 MG tablet TAKE 1 TABLET THREE TIMES A DAY AS NEEDED FOR MUSCLE SPASMS 02/17/21   Laurey Morale, MD  esomeprazole (NEXIUM) 40 MG capsule Take 1 capsule (40 mg total) by mouth daily. MUST HAVE OFFICE VISIT FOR FURTHER REFILLS 02/10/21   Pyrtle, Lajuan Lines, MD  esomeprazole (NEXIUM) 40 MG capsule Take 1 capsule (40 mg total) by mouth 2 (two) times daily before a meal.  03/13/21   Esterwood, Amy S, PA-C  eszopiclone (LUNESTA) 1 MG TABS tablet Take 1 tablet (1 mg total) by mouth at bedtime as needed for sleep. Take immediately before bedtime 12/23/20   Laurey Morale, MD  furosemide (LASIX) 20 MG tablet TAKE 1 TABLET DAILY AS NEEDED FOR FLUID 09/30/20   Laurey Morale, MD  ibuprofen (ADVIL) 800 MG tablet TAKE 1 TABLET EVERY 8 HOURS AS NEEDED FOR PAIN 03/07/21   Laurey Morale, MD  LamoTRIgine XR (LAMICTAL XR) 200 MG TB24 Take 1 tablet (200 mg total) by mouth daily. 12/15/13   Laurey Morale, MD  lisinopril (ZESTRIL) 20 MG tablet Take 1 tablet (20 mg total) by mouth daily. 03/24/21   Laurey Morale, MD  Magnesium 500 MG CAPS Take 400 mg by mouth at bedtime.     [provider]  Melatonin 5 MG CAPS Take 5 mg by mouth at bedtime.    [provider]  OSCIMIN 0.125 MG SUBL PLACE 1 TABLET UNDER THE TONGUE EVERY 6 HOURS AS NEEDED FOR CRAMPING Patient taking differently: Place 0.125 mg under the tongue every 6 (six) hours as needed (cramping). 02/09/17   Pyrtle, Lajuan Lines, MD  prednisoLONE acetate (PRED FORTE) 1 % ophthalmic suspension  10/06/19   [provider]  promethazine (PHENERGAN) 25 MG tablet Take 1 tablet (25 mg total) by mouth every 4 (four) hours as needed for nausea. 04/21/17   Laurey Morale, MD  temazepam (RESTORIL) 30 MG capsule Take 30 mg by mouth at bedtime.    [provider]  traMADol (ULTRAM) 50 MG tablet TAKE 2 TABLETS BY MOUTH EVERY 8 HOURS AS NEEDED FOR MODERATE PAIN 01/30/21   Laurey Morale, MD    Allergies    Celebrex [celecoxib], Morphine and related, Penicillins, Risperdal [risperidone], and Statins  Review of Systems   Review of Systems 10 systems reviewed and negative except as per HPI Physical Exam Updated Vital Signs BP (!) 152/88   Pulse 89   Temp 98.3 F (36.8 C) (Oral)   Resp 18   Ht 5\' 3"  (1.6 m)   Wt 81.6 kg   SpO2 95%   BMI 31.89 kg/m   Physical Exam Constitutional:      Comments: Alert and  nontoxic.  No respiratory distress.  Moderate pain.  HENT:     Head: Normocephalic and atraumatic.     Mouth/Throat:     Pharynx: Oropharynx is clear.  Eyes:     Extraocular Movements: Extraocular movements intact.  Cardiovascular:     Rate and Rhythm: Normal rate and regular rhythm.  Pulmonary:     Effort: Pulmonary effort is normal.     Breath sounds: Normal breath sounds.  Abdominal:     Comments: Abdomen soft.  Moderate central pain, moderate to severe right lower quadrant pain.  No mass or significant rash in the groins  Musculoskeletal:        General: No swelling or tenderness. Normal range of motion.     Right lower leg: No edema.     Left lower leg: No edema.  Skin:    General: Skin is warm and dry.  Neurological:     General: No focal deficit present.     Mental Status: She is oriented to person, place, and time.     Motor: No weakness.     Coordination: Coordination normal.  Psychiatric:     Comments: Patient is mildly anxious.  Status clear     ED Results / Procedures / Treatments   Labs (all labs ordered are listed, but only abnormal results are displayed) Labs Reviewed  COMPREHENSIVE METABOLIC PANEL - Abnormal; Notable for the following components:      Result Value   Glucose, Bld 103 (*)    AST 13 (*)    All other components within normal limits  CBC WITH DIFFERENTIAL/PLATELET  LIPASE, BLOOD  URINALYSIS, ROUTINE W REFLEX MICROSCOPIC    EKG None  Radiology CT Abdomen Pelvis Wo Contrast  Result Date: 03/30/2021 CLINICAL DATA:  Right lower quadrant pain for 3 days. Abdominal distention. EXAM: CT ABDOMEN AND PELVIS WITHOUT CONTRAST TECHNIQUE: Multidetector CT imaging of the abdomen and pelvis was performed following the standard protocol without IV contrast. COMPARISON:  04/10/2013 FINDINGS: Lower chest: No acute findings. Hepatobiliary: No mass visualized on this unenhanced exam. Prior cholecystectomy.  No evidence of biliary obstruction. Pancreas: No mass  or inflammatory process visualized on this unenhanced exam. Spleen:  Within normal limits in size. Adrenals/Urinary tract: No evidence of urolithiasis or hydronephrosis. Right-sided extrarenal pelvis remains stable. Tiny amount of gas is noted in the urinary bladder, likely due to recent catheterization/instrumentation. Stomach/Bowel: No evidence of obstruction, inflammatory process, or abnormal fluid collections. Normal appendix visualized. Mild diffuse colonic diverticulosis is noted, however there is no evidence of diverticulitis. Vascular/Lymphatic: No pathologically enlarged lymph nodes identified. No evidence of abdominal aortic aneurysm. Aortic atherosclerotic calcification noted. Reproductive: 2 cm fibroid noted in the anterior uterine corpus. Adnexal regions are unremarkable. Other:  None. Musculoskeletal: No suspicious bone lesions identified. Stable old L1 vertebral body compression fracture and prior vertebroplasty noted at T11. IMPRESSION: No evidence of appendicitis or other acute findings. Colonic diverticulosis, without radiographic evidence of diverticulitis. Small uterine fibroid. Electronically Signed   By: Marlaine Hind M.D.   On: 03/30/2021 14:18    Procedures Procedures   Medications Ordered in ED Medications  lactated ringers infusion ( Intravenous New Bag/Given 03/30/21 1405)  pantoprazole (PROTONIX) injection 40 mg (40 mg Intravenous Given 03/30/21 1406)  ondansetron (ZOFRAN) injection 4 mg (4 mg Intravenous Given 03/30/21 1407)  LORazepam (ATIVAN) injection 0.5 mg (0.5 mg Intravenous Given 03/30/21 1410)    ED Course  I have reviewed the triage vital signs and the nursing notes.  Pertinent labs & imaging results that were available during my care of the patient were reviewed by me and considered in my medical decision making (see chart for details).    MDM Rules/Calculators/A&P                          Is improved at this time.  Diagnostic work-up is unremarkable.  CT does  not show any acute findings.  No appendicitis.  She does have history of gastroparesis and intermittent abdominal pain.  She will proceed with home management.  She has tramadol and Phenergan to take.  She also takes Nexium regularly.  Patient sees GI and her PCP.  Recommend close follow-up and return precautions reviewed. Final Clinical Impression(s) / ED Diagnoses Final diagnoses:  Right lower quadrant abdominal pain    Rx / DC Orders ED Discharge Orders    None       Charlesetta Shanks, MD 03/30/21 1524

## 2021-04-04 NOTE — Progress Notes (Signed)
Addendum: Reviewed and agree with assessment and management plan. Hektor Huston M, MD  

## 2021-04-14 DIAGNOSIS — H209 Unspecified iridocyclitis: Secondary | ICD-10-CM | POA: Diagnosis not present

## 2021-04-14 DIAGNOSIS — H0288B Meibomian gland dysfunction left eye, upper and lower eyelids: Secondary | ICD-10-CM | POA: Diagnosis not present

## 2021-04-14 DIAGNOSIS — H40211 Acute angle-closure glaucoma, right eye: Secondary | ICD-10-CM | POA: Diagnosis not present

## 2021-04-14 DIAGNOSIS — H04123 Dry eye syndrome of bilateral lacrimal glands: Secondary | ICD-10-CM | POA: Diagnosis not present

## 2021-04-14 DIAGNOSIS — H0288A Meibomian gland dysfunction right eye, upper and lower eyelids: Secondary | ICD-10-CM | POA: Diagnosis not present

## 2021-04-25 ENCOUNTER — Telehealth: Payer: Self-pay | Admitting: Family Medicine

## 2021-04-25 MED ORDER — PROMETHAZINE HCL 25 MG PO TABS: 25.0000 mg | ORAL_TABLET | ORAL | 3 refills | Status: DC | PRN

## 2021-04-25 NOTE — Telephone Encounter (Signed)
Last office visit- 03/24/21 Last refill- 04/11/17  No future office visit scheduled Can this patient receive a refill?

## 2021-04-25 NOTE — Telephone Encounter (Signed)
Done

## 2021-04-25 NOTE — Telephone Encounter (Signed)
Pt called requesting a refill for :promethazine (PHENERGAN) 25 MG tablet. Pt states she takes this as needed. Last refill for this was 2 years ago.  Send to: [place], [place]

## 2021-04-25 NOTE — Telephone Encounter (Signed)
Spoke with pt informed prescription had been sent to pharmacy

## 2021-05-19 DIAGNOSIS — H0288B Meibomian gland dysfunction left eye, upper and lower eyelids: Secondary | ICD-10-CM | POA: Diagnosis not present

## 2021-05-19 DIAGNOSIS — H0288A Meibomian gland dysfunction right eye, upper and lower eyelids: Secondary | ICD-10-CM | POA: Diagnosis not present

## 2021-05-19 DIAGNOSIS — H40032 Anatomical narrow angle, left eye: Secondary | ICD-10-CM | POA: Diagnosis not present

## 2021-05-19 DIAGNOSIS — H04123 Dry eye syndrome of bilateral lacrimal glands: Secondary | ICD-10-CM | POA: Diagnosis not present

## 2021-05-19 DIAGNOSIS — H40211 Acute angle-closure glaucoma, right eye: Secondary | ICD-10-CM | POA: Diagnosis not present

## 2021-05-19 DIAGNOSIS — H53141 Visual discomfort, right eye: Secondary | ICD-10-CM | POA: Diagnosis not present

## 2021-06-16 ENCOUNTER — Other Ambulatory Visit: Payer: Self-pay

## 2021-06-16 ENCOUNTER — Encounter: Payer: Self-pay | Admitting: Family Medicine

## 2021-06-16 ENCOUNTER — Ambulatory Visit (INDEPENDENT_AMBULATORY_CARE_PROVIDER_SITE_OTHER): Payer: Medicare Other | Admitting: Family Medicine

## 2021-06-16 VITALS — BP 128/84 | HR 86 | Temp 98.7°F

## 2021-06-16 DIAGNOSIS — T464X5A Adverse effect of angiotensin-converting-enzyme inhibitors, initial encounter: Secondary | ICD-10-CM

## 2021-06-16 DIAGNOSIS — R058 Other specified cough: Secondary | ICD-10-CM | POA: Diagnosis not present

## 2021-06-16 MED ORDER — LOSARTAN POTASSIUM 50 MG PO TABS: 50.0000 mg | ORAL_TABLET | Freq: Every day | ORAL | 2 refills | Status: DC

## 2021-06-16 NOTE — Progress Notes (Signed)
   Subjective:    Patient ID: TERIANNE SCANTLING, female    DOB: 12/02/1945, 75 y.o.   MRN: [id number]  HPI Here for 3 months of a frequent tickling dry cough. No SOB or wheezing. Of note, in May we increased the dose of her Lisinopril to 20 mg daily. The cough started shortly after that.    Review of Systems  Constitutional: Negative.   Respiratory:  Positive for cough. Negative for shortness of breath and wheezing.   Cardiovascular: Negative.       Objective:   Physical Exam Constitutional:      General: She is not in acute distress.    Appearance: Normal appearance.  Cardiovascular:     Rate and Rhythm: Normal rate and regular rhythm.     Pulses: Normal pulses.     Heart sounds: Normal heart sounds.  Pulmonary:     Effort: Pulmonary effort is normal. No respiratory distress.     Breath sounds: No stridor. No wheezing, rhonchi or rales.  Musculoskeletal:     Comments: Trace bilateral ankle edema   Neurological:     Mental Status: She is alert.          Assessment & Plan:  Chronic cough, likely a side effect of the Lisinopril. We will stop this and start on Losartan 50 mg daily. They will monitor the BP and report back to [place] in 3-4 weeks. Alysia Penna, MD

## 2021-07-01 ENCOUNTER — Telehealth: Payer: Self-pay | Admitting: Family Medicine

## 2021-07-01 NOTE — Telephone Encounter (Signed)
The patient needs an application form to get a disability license plate. She did not need a handicapped Placard. She would like to take the forms from our office to the [employer]. Please call patient  [phone]

## 2021-07-02 NOTE — Telephone Encounter (Signed)
Pt came in checking on status of this form. She would like it faxed on [phone]. Please advise when complete

## 2021-07-02 NOTE — Telephone Encounter (Signed)
Patient requesting disability license plated.   Last office visit- 06/16/21

## 2021-07-03 NOTE — Telephone Encounter (Signed)
Spoke with patient, advised to pick up form from [facility], then bring to office to fill out.   Voiced understanding.

## 2021-07-03 NOTE — Telephone Encounter (Signed)
We do not have such forms here. She will need to get one from the [facility] and bring it to [place]. I will be glad to fill it out then

## 2021-07-04 ENCOUNTER — Telehealth: Payer: Self-pay

## 2021-07-04 NOTE — Telephone Encounter (Signed)
Pt called the office requesting for an application form for disability license plate, pt advised that our office does not the forms and she will need to drop one off for compelling, pt state that she has downloaded one and will bring it to her appointment with Dr Sarajane Jews on 07/07/2021

## 2021-07-07 ENCOUNTER — Other Ambulatory Visit: Payer: Self-pay

## 2021-07-07 ENCOUNTER — Ambulatory Visit (INDEPENDENT_AMBULATORY_CARE_PROVIDER_SITE_OTHER): Payer: Medicare Other | Admitting: Family Medicine

## 2021-07-07 ENCOUNTER — Encounter: Payer: Self-pay | Admitting: Family Medicine

## 2021-07-07 VITALS — BP 138/82 | HR 76 | Temp 98.6°F

## 2021-07-07 DIAGNOSIS — R058 Other specified cough: Secondary | ICD-10-CM

## 2021-07-07 DIAGNOSIS — N3942 Incontinence without sensory awareness: Secondary | ICD-10-CM

## 2021-07-07 DIAGNOSIS — T464X5A Adverse effect of angiotensin-converting-enzyme inhibitors, initial encounter: Secondary | ICD-10-CM

## 2021-07-07 DIAGNOSIS — I10 Essential (primary) hypertension: Secondary | ICD-10-CM

## 2021-07-07 MED ORDER — LOSARTAN POTASSIUM 50 MG PO TABS: 50.0000 mg | ORAL_TABLET | Freq: Every day | ORAL | 3 refills | Status: DC

## 2021-07-07 MED ORDER — OXYBUTYNIN CHLORIDE ER 10 MG PO TB24: 10.0000 mg | ORAL_TABLET | Freq: Every day | ORAL | 3 refills | Status: DC

## 2021-07-07 NOTE — Progress Notes (Signed)
   Subjective:    Patient ID: Audrey Brown, female    DOB: 21-May-1946, 75 y.o.   MRN: [id number]  HPI Here for several issues. First we stopped her Lisinopril a few weeks ago because it gave her a cough. Now this has greatly improved. In its place we started on Losartan, and her BP remains stable. Also she complains of a long hx of urinary incontinence. Her bladder will empty itself with no warning, so she has been wearing protective undergarments. There is no discomfort.    Review of Systems  Constitutional: Negative.   Respiratory: Negative.    Cardiovascular: Negative.       Objective:   Physical Exam Constitutional:      Appearance: Normal appearance.  Cardiovascular:     Rate and Rhythm: Normal rate and regular rhythm.     Pulses: Normal pulses.     Heart sounds: Normal heart sounds.  Pulmonary:     Effort: Pulmonary effort is normal.     Breath sounds: Normal breath sounds.  Neurological:     Mental Status: She is alert.          Assessment & Plan:  Her HTN is well controlled. Her ACE inhibitor cough has almost disappeared. She has over flow urinary incontinence, and she will try Oxybutynin ER daily for this. Report back in 2 weeks. We spent 35 minutes reviewing records and discussing these issues.  Alysia Penna, MD

## 2021-08-01 ENCOUNTER — Other Ambulatory Visit: Payer: Self-pay | Admitting: Family Medicine

## 2021-08-01 DIAGNOSIS — Z1231 Encounter for screening mammogram for malignant neoplasm of breast: Secondary | ICD-10-CM

## 2021-09-05 ENCOUNTER — Ambulatory Visit
Admission: RE | Admit: 2021-09-05 | Discharge: 2021-09-05 | Disposition: A | Discharge status: Home / Self Care | Payer: Medicare Other | Source: Ambulatory Visit | Attending: Family Medicine | Admitting: Family Medicine

## 2021-09-05 ENCOUNTER — Other Ambulatory Visit: Payer: Self-pay

## 2021-09-05 DIAGNOSIS — Z1231 Encounter for screening mammogram for malignant neoplasm of breast: Secondary | ICD-10-CM | POA: Diagnosis not present

## 2021-09-11 ENCOUNTER — Other Ambulatory Visit: Payer: Self-pay | Admitting: Family Medicine

## 2021-09-13 ENCOUNTER — Other Ambulatory Visit: Payer: Self-pay | Admitting: Family Medicine

## 2021-09-19 ENCOUNTER — Other Ambulatory Visit: Payer: Self-pay | Admitting: Family Medicine

## 2021-10-04 ENCOUNTER — Other Ambulatory Visit: Payer: Self-pay

## 2021-10-04 ENCOUNTER — Emergency Department (HOSPITAL_COMMUNITY): Payer: Medicare Other

## 2021-10-04 ENCOUNTER — Emergency Department (HOSPITAL_COMMUNITY)
Admission: EM | Admit: 2021-10-04 | Discharge: 2021-10-04 | Disposition: A | Discharge status: Home / Self Care | Payer: Medicare Other | Attending: Emergency Medicine | Admitting: Emergency Medicine

## 2021-10-04 DIAGNOSIS — M545 Low back pain, unspecified: Secondary | ICD-10-CM | POA: Diagnosis not present

## 2021-10-04 DIAGNOSIS — M47816 Spondylosis without myelopathy or radiculopathy, lumbar region: Secondary | ICD-10-CM | POA: Insufficient documentation

## 2021-10-04 DIAGNOSIS — M549 Dorsalgia, unspecified: Secondary | ICD-10-CM | POA: Diagnosis not present

## 2021-10-04 DIAGNOSIS — M47896 Other spondylosis, lumbar region: Secondary | ICD-10-CM | POA: Diagnosis not present

## 2021-10-04 DIAGNOSIS — I10 Essential (primary) hypertension: Secondary | ICD-10-CM | POA: Insufficient documentation

## 2021-10-04 DIAGNOSIS — G20 Parkinson's disease: Secondary | ICD-10-CM | POA: Insufficient documentation

## 2021-10-04 DIAGNOSIS — G8929 Other chronic pain: Secondary | ICD-10-CM | POA: Insufficient documentation

## 2021-10-04 DIAGNOSIS — F0390 Unspecified dementia without behavioral disturbance: Secondary | ICD-10-CM | POA: Insufficient documentation

## 2021-10-04 DIAGNOSIS — W19XXXA Unspecified fall, initial encounter: Secondary | ICD-10-CM | POA: Diagnosis not present

## 2021-10-04 MED ORDER — HYDROCODONE-ACETAMINOPHEN 5-325 MG PO TABS: 1.0000 | ORAL_TABLET | ORAL | 0 refills | Status: DC | PRN

## 2021-10-04 MED ORDER — HYDROCODONE-ACETAMINOPHEN 5-325 MG PO TABS
2.0000 | ORAL_TABLET | Freq: Once | ORAL | Status: AC
  Administered 2021-10-04: 2 via ORAL
  Filled 2021-10-04: qty 2

## 2021-10-04 NOTE — Discharge Instructions (Addendum)
X-ray does not show any serious problems.  Continue to use ice or heat as needed for comfort.  We are prescribing hydrocodone to use instead of tramadol.  Do not take them together.

## 2021-10-04 NOTE — ED Provider Notes (Signed)
[facility] DEPT Provider Note   CSN: [id number] Arrival date & time: 10/04/21  1158     History No chief complaint on file.   Audrey Brown is a 75 y.o. female.  HPI She resents for evaluation of increasing pain since a fall 4 days ago.  She "gradually slipped to the floor," causing pain in the low back.  Since then she has been able to walk and using tramadol, without relief of her pain.  She feels like the pain is worsening.  She has history of ongoing chronic back pain with prior falls and compression fracture with arthritis.  She denies change in bowel or bladder function, nausea, vomiting, fever or chills.  There are no other known active modifying factors.    Past Medical History:  Diagnosis Date   Arthritis    lower back   Bipolar disorder ([place])    Depression    GERD (gastroesophageal reflux disease)    Glaucoma    bilateral   Hiatal hernia    Loss of vision    right eye   Parkinson disease ([place])    Poor short-term memory    Sagittal band rupture at metacarpophalangeal joint 11/2016   right long finger    Patient Active Problem List   Diagnosis Date Noted   HTN (hypertension) 12/23/2020   Pain in right knee 11/22/2018   Other constipation 09/21/2018   Delayed gastric emptying 09/14/2018   Insomnia 06/09/2017   Chronic mid back pain 08/14/2014   Dementia in corticobasal degeneration ([place]) 04/10/2014   Abnormality of gait 04/10/2014   Lumbosacral spondylosis without myelopathy 04/10/2014   Dysphagia 06/16/2011   GERD with stricture 06/16/2011   Bipolar 1 disorder, mixed ([place]) 06/16/2011   Parkinsonian syndrome ([place]) 06/16/2011   Closed fracture of lumbar vertebra ([place]) 11/26/2010   PARKINSONISM 11/13/2010   TINEA CORPORIS 07/31/2010   CELLULITIS, LEGS 07/31/2010   RESTING TREMOR 07/31/2010   DEPENDENT EDEMA 06/25/2009   GERD 11/19/2008   WEAKNESS 11/19/2008   Depression 01/02/2008   HEADACHE 01/02/2008   CHICKENPOX,  HX OF 01/02/2008    Past Surgical History:  Procedure Laterality Date   ANKLE SURGERY     BALLOON DILATION N/A 10/29/2017   Procedure: BALLOON DILATION;  Surgeon: Jerene Bears, MD;  Location: [facility];  Service: Gastroenterology;  Laterality: N/A;   CATARACT EXTRACTION Right 08/07/2020   CERVICAL DISC SURGERY  1994   CESAREAN SECTION     CHOLECYSTECTOMY     CYST REMOVAL NECK     ESOPHAGEAL DILATION  12/19/2018   Procedure: ESOPHAGEAL DILATION;  Surgeon: Jerene Bears, MD;  Location: [facility];  Service: Gastroenterology;;   ESOPHAGOGASTRODUODENOSCOPY (EGD) WITH ESOPHAGEAL DILATION  07/27/2012   with Propofol   ESOPHAGOGASTRODUODENOSCOPY (EGD) WITH PROPOFOL N/A 06/23/2016   Procedure: ESOPHAGOGASTRODUODENOSCOPY (EGD) WITH PROPOFOL;  Surgeon: Jerene Bears, MD;  Location: [facility];  Service: Gastroenterology;  Laterality: N/A;   ESOPHAGOGASTRODUODENOSCOPY (EGD) WITH PROPOFOL N/A 10/29/2017   Procedure: ESOPHAGOGASTRODUODENOSCOPY (EGD) WITH PROPOFOL;  Surgeon: Jerene Bears, MD;  Location: [facility];  Service: Gastroenterology;  Laterality: N/A;   ESOPHAGOGASTRODUODENOSCOPY (EGD) WITH PROPOFOL N/A 12/19/2018   Procedure: ESOPHAGOGASTRODUODENOSCOPY (EGD) WITH PROPOFOL;  Surgeon: Jerene Bears, MD;  Location: [facility];  Service: Gastroenterology;  Laterality: N/A;   EYE SURGERY     EYE SURGERY  09/2019   R eye procedure to relieve pressure   EYE SURGERY     FOOT NEUROMA SURGERY Left    ORIF  DISTAL RADIUS FRACTURE Right 07/04/2010   SAVORY DILATION N/A 06/23/2016   Procedure: SAVORY DILATION;  Surgeon: Jerene Bears, MD;  Location: [facility];  Service: Gastroenterology;  Laterality: N/A;   TENDON REPAIR Right 12/01/2016   Procedure: right long finger ulnar sagittal band reconstruction;  Surgeon: Leanora Cover, MD;  Location: [facility];  Service: Orthopedics;  Laterality: Right;   TONSILLECTOMY       OB History   No obstetric history on file.     Family  History  Problem Relation Age of Onset   Emphysema Mother    Heart attack Father    Hypertension Other    Lung cancer Other    Colon cancer Neg Hx    Esophageal cancer Neg Hx    Rectal cancer Neg Hx    Stomach cancer Neg Hx     Social History   Tobacco Use   Smoking status: Never   Smokeless tobacco: Never  Vaping Use   Vaping Use: Never used  Substance Use Topics   Alcohol use: No    Alcohol/week: 0.0 standard drinks   Drug use: No    Home Medications Prior to Admission medications   Medication Sig Start Date End Date Taking? Authorizing Provider  HYDROcodone-acetaminophen (NORCO/VICODIN) 5-325 MG tablet Take 1-2 tablets by mouth every 4 (four) hours as needed. 10/04/21  Yes Daleen Bo, MD  ALPRAZolam Duanne Moron) 0.25 MG tablet TAKE 1 TABLET THREE TIMES A DAY AS NEEDED FOR ANXIETY 11/04/20   Laurey Morale, MD  AMBULATORY NON FORMULARY MEDICATION Take 10 mg by mouth 3 (three) times daily. Medication Name: Domperidone 10 mg 03/13/21   Esterwood, Amy S, PA-C  buPROPion (WELLBUTRIN XL) 300 MG 24 hr tablet Take 300 mg by mouth daily.  03/27/16   [provider]  busPIRone (BUSPAR) 10 MG tablet Take 10 mg by mouth 2 (two) times daily.     [provider]  Calcium Carb-Cholecalciferol 500-100 MG-UNIT CHEW Chew by mouth.    [provider]  calcium-vitamin D (OSCAL WITH D) 500-200 MG-UNIT tablet Take 1 tablet by mouth 2 (two) times daily.    [provider]  cyclobenzaprine (FLEXERIL) 10 MG tablet TAKE 1 TABLET THREE TIMES A DAY AS NEEDED FOR MUSCLE SPASMS 02/17/21   Laurey Morale, MD  esomeprazole (NEXIUM) 40 MG capsule Take 1 capsule (40 mg total) by mouth daily. MUST HAVE OFFICE VISIT FOR FURTHER REFILLS 02/10/21   Pyrtle, Lajuan Lines, MD  esomeprazole (NEXIUM) 40 MG capsule Take 1 capsule (40 mg total) by mouth 2 (two) times daily before a meal. 03/13/21   Esterwood, Amy S, PA-C  eszopiclone (LUNESTA) 1 MG TABS tablet Take 1 tablet (1 mg total) by mouth at  bedtime as needed for sleep. Take immediately before bedtime 12/23/20   Laurey Morale, MD  furosemide (LASIX) 20 MG tablet TAKE 1 TABLET DAILY AS NEEDED FOR FLUID 09/19/21   Laurey Morale, MD  ibuprofen (ADVIL) 800 MG tablet TAKE 1 TABLET EVERY 8 HOURS AS NEEDED FOR PAIN 03/07/21   Laurey Morale, MD  LamoTRIgine XR (LAMICTAL XR) 200 MG TB24 Take 1 tablet (200 mg total) by mouth daily. 12/15/13   Laurey Morale, MD  losartan (COZAAR) 50 MG tablet TAKE 1 TABLET(50 MG) BY MOUTH DAILY 09/11/21   Laurey Morale, MD  Magnesium 500 MG CAPS Take 400 mg by mouth at bedtime.     [provider]  Melatonin 5 MG CAPS Take 5  mg by mouth at bedtime.    [provider]  OSCIMIN 0.125 MG SUBL PLACE 1 TABLET UNDER THE TONGUE EVERY 6 HOURS AS NEEDED FOR CRAMPING Patient taking differently: Place 0.125 mg under the tongue every 6 (six) hours as needed (cramping). 02/09/17   Pyrtle, Lajuan Lines, MD  oxybutynin (DITROPAN-XL) 10 MG 24 hr tablet Take 1 tablet (10 mg total) by mouth at bedtime. 07/07/21   Laurey Morale, MD  promethazine (PHENERGAN) 25 MG tablet Take 1 tablet (25 mg total) by mouth every 4 (four) hours as needed for nausea. 04/25/21   Laurey Morale, MD  temazepam (RESTORIL) 30 MG capsule Take 30 mg by mouth at bedtime.    [provider]  traMADol (ULTRAM) 50 MG tablet TAKE 2 TABLETS BY MOUTH EVERY 8 HOURS AS NEEDED FOR MODERATE PAIN 01/30/21   Laurey Morale, MD    Allergies    Celebrex [celecoxib], Morphine and related, Penicillins, Risperdal [risperidone], Statins, and Lisinopril  Review of Systems   Review of Systems  All other systems reviewed and are negative.  Physical Exam Updated Vital Signs BP (!) 156/80 (BP Location: Right Arm)   Pulse 70   Temp 99.2 F (37.3 C) (Oral)   Resp 16   Ht 5\' 3"  (1.6 m)   Wt 81.6 kg   SpO2 91%   BMI 31.89 kg/m   Physical Exam Vitals and nursing note (Sitting in wheelchair during examination) reviewed.  Constitutional:      General:  She is not in acute distress.    Appearance: She is well-developed. She is not ill-appearing, toxic-appearing or diaphoretic.  HENT:     Head: Normocephalic and atraumatic.     Right Ear: External ear normal.     Left Ear: External ear normal.  Eyes:     Conjunctiva/sclera: Conjunctivae normal.     Pupils: Pupils are equal, round, and reactive to light.  Neck:     Trachea: Phonation normal.  Cardiovascular:     Rate and Rhythm: Normal rate and regular rhythm.     Heart sounds: Normal heart sounds.  Pulmonary:     Effort: Pulmonary effort is normal.     Breath sounds: Normal breath sounds.  Abdominal:     General: There is no distension.     Palpations: Abdomen is soft.     Tenderness: There is no abdominal tenderness.  Musculoskeletal:        General: Normal range of motion.     Cervical back: Normal range of motion and neck supple.     Comments: She guards against moving the lower back secondary to pain.  The lower back is diffusely tender to palpation.  There is no distinct localized spinal tenderness of the lumbar or thoracic spines.  Normal range of motion of the neck.  Negative flexed leg raising bilaterally.  Skin:    General: Skin is warm and dry.  Neurological:     Mental Status: She is alert and oriented to person, place, and time.     Cranial Nerves: No cranial nerve deficit.     Sensory: No sensory deficit.     Motor: No abnormal muscle tone.     Coordination: Coordination normal.  Psychiatric:        Mood and Affect: Mood normal.        Behavior: Behavior normal.        Thought Content: Thought content normal.        Judgment: Judgment normal.  ED Results / Procedures / Treatments   Labs (all labs ordered are listed, but only abnormal results are displayed) Labs Reviewed - No data to display  EKG None  Radiology DG Lumbar Spine Complete  Result Date: 10/04/2021 CLINICAL DATA:  Pain post fall EXAM: LUMBAR SPINE - COMPLETE 4+ VIEW COMPARISON:  CT  03/30/2021 and previous FINDINGS: Stable changes of T11 cement augmentation. Stable mild L1 superior endplate compression deformity. No acute fracture. Normal alignment. Mild facet DJD L4-S1. Cholecystectomy clips. IMPRESSION: 1. No fracture or other acute finding. 2. Stable L1 compression deformity, and T11 compression deformity post cement augmentation. Electronically Signed   By: Lucrezia Europe M.D.   On: 10/04/2021 14:06    Procedures Procedures   Medications Ordered in ED Medications  HYDROcodone-acetaminophen (NORCO/VICODIN) 5-325 MG per tablet 2 tablet (2 tablets Oral Given 10/04/21 1333)    ED Course  I have reviewed the triage vital signs and the nursing notes.  Pertinent labs & imaging results that were available during my care of the patient were reviewed by me and considered in my medical decision making (see chart for details).    MDM Rules/Calculators/A&P                            Patient Vitals for the past 24 hrs:  BP Temp Temp src Pulse Resp SpO2 Height Weight  10/04/21 1207 -- -- -- -- -- -- 5\' 3"  (1.6 m) 81.6 kg  10/04/21 1205 (!) 156/80 99.2 F (37.3 C) Oral 70 16 91 % -- --  10/04/21 1205 (!) 156/80 -- -- -- -- -- -- --    2:36 PM Reevaluation with update and discussion. After initial assessment and treatment, an updated evaluation reveals she states her pain has improved after treatment.  Findings discussed with the patient and her family member, all questions were answered. Daleen Bo   Medical Decision Making:  This patient is presenting for evaluation of low back pain worsening after a fall 4 days ago, which does require a range of treatment options, and is a complaint that involves a moderate risk of morbidity and mortality. The differential diagnoses include fracture, aggravation of chronic degenerative changes, muscle strain, muscle spasm. I decided to review old records, and in summary elderly female with history of prior compression fractures and  degenerative joint disease of lower back presenting with worsening pain after fall.  I obtained additional historical information from family member at bedside.  Radiologic Tests Ordered, included LS spine series.  I independently Visualized: Radiograph images, which show stable chronic changes.   Critical Interventions-clinical evaluation, medication treatment, radiography, observation and reassessment  After These Interventions, the Patient was reevaluated and was found stable for discharge.  Patient with aggravation of chronic back pain, requiring further intervention or hospitalization at this time.  CRITICAL CARE-no Performed by: Daleen Bo  Nursing Notes Reviewed/ Care Coordinated Applicable Imaging Reviewed Interpretation of Laboratory Data incorporated into ED treatment  The patient appears reasonably screened and/or stabilized for discharge and I doubt any other medical condition or other [facility] requiring further screening, evaluation, or treatment in the ED at this time prior to discharge.  Plan: Home Medications-use hydrocodone instead of tramadol continue other medicine; Home Treatments-correct advance activity, cryotherapy and heat therapy as needed; return here if the recommended treatment, does not improve the symptoms; Recommended follow up-PCP, PRN     Final Clinical Impression(s) / ED Diagnoses Final diagnoses:  Bilateral low back pain  without sciatica, unspecified chronicity  Spondylosis of lumbar region without myelopathy or radiculopathy    Rx / DC Orders ED Discharge Orders          Ordered    HYDROcodone-acetaminophen (NORCO/VICODIN) 5-325 MG tablet  Every 4 hours PRN        10/04/21 1443             Daleen Bo, MD 10/04/21 1443

## 2021-10-04 NOTE — ED Triage Notes (Signed)
Patient bib [facility], left lower back pain, left hip pain, patient fell on Wednesday . No bruising/deformity noted to affected area. Patient has history of chronic back pain.

## 2021-10-16 ENCOUNTER — Ambulatory Visit (INDEPENDENT_AMBULATORY_CARE_PROVIDER_SITE_OTHER): Payer: Medicare Other | Admitting: Family Medicine

## 2021-10-16 ENCOUNTER — Encounter: Payer: Self-pay | Admitting: Family Medicine

## 2021-10-16 VITALS — BP 124/80 | HR 70 | Temp 98.5°F

## 2021-10-16 DIAGNOSIS — N9089 Other specified noninflammatory disorders of vulva and perineum: Secondary | ICD-10-CM | POA: Diagnosis not present

## 2021-10-16 DIAGNOSIS — M5441 Lumbago with sciatica, right side: Secondary | ICD-10-CM

## 2021-10-16 DIAGNOSIS — Z23 Encounter for immunization: Secondary | ICD-10-CM

## 2021-10-16 MED ORDER — TRAMADOL HCL 50 MG PO TABS: 100.0000 mg | ORAL_TABLET | Freq: Four times a day (QID) | ORAL | 1 refills | Status: DC | PRN

## 2021-10-16 NOTE — Progress Notes (Signed)
   Subjective:    Patient ID: Audrey Brown, female    DOB: September 06, 1946, 75 y.o.   MRN: [id number]  HPI Here to follow up an ER visit on 10-04-21 for low back pain that started when she fell at home on 09-30-21. Xrays that day showed old compression fractures at L1 and T11, but there were no acute fractures. She was given a few Norco and sent home. Since then the pain has gradually been improving. She takes Tramadol as needed. Also she mentions recurrent boils in the groin areas, and she wants to see a Dermatologist for this.    Review of Systems  Constitutional: Negative.   Respiratory: Negative.    Cardiovascular: Negative.   Musculoskeletal:  Positive for back pain.      Objective:   Physical Exam Constitutional:      General: She is not in acute distress.    Comments: In her wheelchair   Cardiovascular:     Rate and Rhythm: Normal rate and regular rhythm.     Pulses: Normal pulses.     Heart sounds: Normal heart sounds.  Pulmonary:     Effort: Pulmonary effort is normal.     Breath sounds: Normal breath sounds.  Musculoskeletal:     Comments: Mildly tender in the right lower back, full ROM, negative SLR   Neurological:     Mental Status: She is alert.          Assessment & Plan:  Low back pain after a fall, she can use Tramadol as needed. This was refilled . We will refer her to Dermatology for the perineal cysts. We spent a total of (33   ) minutes reviewing records and discussing these issues.  Alysia Penna, MD  Alysia Penna, MD

## 2021-10-16 NOTE — Addendum Note (Signed)
Addended by: Wyvonne Lenz on: 10/16/2021 10:56 AM   Modules accepted: Orders

## 2021-11-05 ENCOUNTER — Other Ambulatory Visit: Payer: Self-pay

## 2021-11-05 ENCOUNTER — Ambulatory Visit (INDEPENDENT_AMBULATORY_CARE_PROVIDER_SITE_OTHER): Payer: Medicare Other | Admitting: Family Medicine

## 2021-11-05 ENCOUNTER — Ambulatory Visit (INDEPENDENT_AMBULATORY_CARE_PROVIDER_SITE_OTHER): Payer: Medicare Other

## 2021-11-05 ENCOUNTER — Encounter: Payer: Self-pay | Admitting: Family Medicine

## 2021-11-05 VITALS — BP 110/82 | HR 73 | Temp 98.9°F

## 2021-11-05 DIAGNOSIS — M79641 Pain in right hand: Secondary | ICD-10-CM

## 2021-11-05 DIAGNOSIS — M7989 Other specified soft tissue disorders: Secondary | ICD-10-CM | POA: Diagnosis not present

## 2021-11-05 NOTE — Progress Notes (Signed)
° °  Subjective:    Patient ID: RAFEEF Brown, female    DOB: 02-21-1946, 75 y.o.   MRN: [id number]  HPI Here with her daughter for 5 days of pain and swelling in the right hand. No known trauma. The 3rd finger and the 3rd knuckle are the most affected. There was black and blue the first few days, but this is clearing now. She has been icing it.    Review of Systems  Constitutional: Negative.   Respiratory: Negative.    Cardiovascular: Negative.   Musculoskeletal:  Positive for arthralgias.      Objective:   Physical Exam Constitutional:      General: She is not in acute distress.    Appearance: Normal appearance.  Cardiovascular:     Rate and Rhythm: Normal rate and regular rhythm.     Pulses: Normal pulses.     Heart sounds: Normal heart sounds.  Pulmonary:     Effort: Pulmonary effort is normal.     Breath sounds: Normal breath sounds.  Musculoskeletal:     Comments: The right hand shows some swelling in the 3rd MCP joint and the 3rd PIP joint. The entire proximal phalanx is tender. There is slight ecchymosis. ROM is limited   Neurological:     Mental Status: She is alert.          Assessment & Plan:  Apparent injury to the right hand and right 3rd finger. She will continue to ice it. We will get Xrays of it today. Alysia Penna, MD

## 2021-11-14 ENCOUNTER — Ambulatory Visit (INDEPENDENT_AMBULATORY_CARE_PROVIDER_SITE_OTHER): Payer: Medicare Other

## 2021-11-14 VITALS — BP 118/78 | Temp 98.1°F | Ht 63.0 in | Wt 185.0 lb

## 2021-11-14 DIAGNOSIS — Z0000 Encounter for general adult medical examination without abnormal findings: Secondary | ICD-10-CM | POA: Diagnosis not present

## 2021-11-14 NOTE — Progress Notes (Signed)
Subjective:   Audrey Brown is a 76 y.o. female who presents for Medicare Annual (Subsequent) preventive examination.  Review of Systems    No ROS      Objective:    There were no vitals filed for this visit. There is no height or weight on file to calculate BMI.  Advanced Directives 10/04/2021 03/30/2021 11/13/2020 10/25/2019 12/19/2018 11/03/2017 10/29/2017  Does Patient Have a Medical Advance Directive? No No Yes Yes Yes Yes Yes  Type of Advance Directive - Public librarian;Living will [place];Living will [place];Living will Healthcare Power of [place]  Does patient want to make changes to medical advance directive? - - No - Patient declined No - Patient declined - No - Patient declined -  Copy of [place] in Chart? - - No - copy requested No - copy requested Yes - validated most recent copy scanned in chart (See row information) Yes Yes  Would patient like information on creating a medical advance directive? - - - - - - -    Current Medications (verified) Outpatient Encounter Medications as of 11/14/2021  Medication Sig   ALPRAZolam (XANAX) 0.25 MG tablet TAKE 1 TABLET THREE TIMES A DAY AS NEEDED FOR ANXIETY   AMBULATORY NON FORMULARY MEDICATION Take 10 mg by mouth 3 (three) times daily. Medication Name: Domperidone 10 mg   buPROPion (WELLBUTRIN XL) 300 MG 24 hr tablet Take 300 mg by mouth daily.    busPIRone (BUSPAR) 10 MG tablet Take 10 mg by mouth 2 (two) times daily.    Calcium Carb-Cholecalciferol 500-100 MG-UNIT CHEW Chew by mouth.   cyclobenzaprine (FLEXERIL) 10 MG tablet TAKE 1 TABLET THREE TIMES A DAY AS NEEDED FOR MUSCLE SPASMS   esomeprazole (NEXIUM) 40 MG capsule Take 1 capsule (40 mg total) by mouth 2 (two) times daily before a meal.   eszopiclone (LUNESTA) 1 MG TABS tablet Take 1 tablet (1 mg total) by mouth at bedtime as needed for sleep. Take immediately before  bedtime   furosemide (LASIX) 20 MG tablet TAKE 1 TABLET DAILY AS NEEDED FOR FLUID   ibuprofen (ADVIL) 800 MG tablet TAKE 1 TABLET EVERY 8 HOURS AS NEEDED FOR PAIN   LamoTRIgine XR (LAMICTAL XR) 200 MG TB24 Take 1 tablet (200 mg total) by mouth daily.   losartan (COZAAR) 50 MG tablet TAKE 1 TABLET(50 MG) BY MOUTH DAILY   Magnesium 500 MG CAPS Take 400 mg by mouth at bedtime.    Melatonin 5 MG CAPS Take 5 mg by mouth at bedtime.   OSCIMIN 0.125 MG SUBL PLACE 1 TABLET UNDER THE TONGUE EVERY 6 HOURS AS NEEDED FOR CRAMPING (Patient taking differently: Place 0.125 mg under the tongue every 6 (six) hours as needed (cramping).)   oxybutynin (DITROPAN-XL) 10 MG 24 hr tablet Take 1 tablet (10 mg total) by mouth at bedtime.   promethazine (PHENERGAN) 25 MG tablet Take 1 tablet (25 mg total) by mouth every 4 (four) hours as needed for nausea.   temazepam (RESTORIL) 30 MG capsule Take 30 mg by mouth at bedtime.   traMADol (ULTRAM) 50 MG tablet Take 2 tablets (100 mg total) by mouth every 6 (six) hours as needed for moderate pain.   No facility-administered encounter medications on file as of 11/14/2021.    Allergies (verified) Celebrex [celecoxib], Morphine and related, Penicillins, Risperdal [risperidone], Statins, Lisinopril, and Hydrocodone   History: Past Medical History:  Diagnosis Date  Arthritis    lower back   Bipolar disorder (HCC)    Depression    GERD (gastroesophageal reflux disease)    Glaucoma    bilateral   Hiatal hernia    Loss of vision    right eye   Parkinson disease (HCC)    Poor short-term memory    Sagittal band rupture at metacarpophalangeal joint 11/2016   right long finger   Past Surgical History:  Procedure Laterality Date   ANKLE SURGERY     BALLOON DILATION N/A 10/29/2017   Procedure: BALLOON DILATION;  Surgeon: Jerene Bears, MD;  Location: [facility];  Service: Gastroenterology;  Laterality: N/A;   CATARACT EXTRACTION Right 08/07/2020   CERVICAL DISC  SURGERY  1994   CESAREAN SECTION     CHOLECYSTECTOMY     CYST REMOVAL NECK     ESOPHAGEAL DILATION  12/19/2018   Procedure: ESOPHAGEAL DILATION;  Surgeon: Jerene Bears, MD;  Location: [facility];  Service: Gastroenterology;;   ESOPHAGOGASTRODUODENOSCOPY (EGD) WITH ESOPHAGEAL DILATION  07/27/2012   with Propofol   ESOPHAGOGASTRODUODENOSCOPY (EGD) WITH PROPOFOL N/A 06/23/2016   Procedure: ESOPHAGOGASTRODUODENOSCOPY (EGD) WITH PROPOFOL;  Surgeon: Jerene Bears, MD;  Location: [facility];  Service: Gastroenterology;  Laterality: N/A;   ESOPHAGOGASTRODUODENOSCOPY (EGD) WITH PROPOFOL N/A 10/29/2017   Procedure: ESOPHAGOGASTRODUODENOSCOPY (EGD) WITH PROPOFOL;  Surgeon: Jerene Bears, MD;  Location: [facility];  Service: Gastroenterology;  Laterality: N/A;   ESOPHAGOGASTRODUODENOSCOPY (EGD) WITH PROPOFOL N/A 12/19/2018   Procedure: ESOPHAGOGASTRODUODENOSCOPY (EGD) WITH PROPOFOL;  Surgeon: Jerene Bears, MD;  Location: [facility];  Service: Gastroenterology;  Laterality: N/A;   EYE SURGERY     EYE SURGERY  09/2019   R eye procedure to relieve pressure   EYE SURGERY     FOOT NEUROMA SURGERY Left    ORIF DISTAL RADIUS FRACTURE Right 07/04/2010   SAVORY DILATION N/A 06/23/2016   Procedure: SAVORY DILATION;  Surgeon: Jerene Bears, MD;  Location: [facility];  Service: Gastroenterology;  Laterality: N/A;   TENDON REPAIR Right 12/01/2016   Procedure: right long finger ulnar sagittal band reconstruction;  Surgeon: Leanora Cover, MD;  Location: [place];  Service: Orthopedics;  Laterality: Right;   TONSILLECTOMY     Family History  Problem Relation Age of Onset   Emphysema Mother    Heart attack Father    Hypertension Other    Lung cancer Other    Colon cancer Neg Hx    Esophageal cancer Neg Hx    Rectal cancer Neg Hx    Stomach cancer Neg Hx    Social History   Socioeconomic History   Marital status: Widowed    Spouse name: Not on file   Number of children: 4   Years of  education: 6 years college   Highest education level: Master's degree (e.g., MA, MS, MEng, MEd, MSW, MBA)  Occupational History   Occupation: Retired    Fish farm manager: RETIRED    Comment: [employer]  Tobacco Use   Smoking status: Never   Smokeless tobacco: Never  Vaping Use   Vaping Use: Never used  Substance and Sexual Activity   Alcohol use: No    Alcohol/week: 0.0 standard drinks   Drug use: No   Sexual activity: Not Currently  Other Topics Concern   Not on file  Social History Narrative   Daughter is her POA and must come to all visits in [place] and [place]       Patient is right-handed. She lives with her  daughter and granddaughter in a split level home with a chair lift. Uses a power chair. She drinks 3-4 cups of coffee a day.      Has 4 daughters, 3 grand children, and 5 great-grand children.   Social Determinants of Health   Financial Resource Strain: Not on file  Food Insecurity: Not on file  Transportation Needs: Not on file  Physical Activity: Not on file  Stress: Not on file  Social Connections: Not on file    Clinical Intake:    Diabetic? No   Activities of Daily Living No flowsheet data found.  Patient Care Team: Laurey Morale, MD as PCP - Loman [place], Stephan Minister, DO as Consulting Physician (Neurology)  Indicate any recent Medical Services you may have received from other than [facility] providers in the past year (date may be approximate).     Assessment:   This is a routine wellness examination for Audrey Brown.  Hearing/Vision screen No results found.  Dietary issues and exercise activities discussed:     Goals Addressed   None    Depression Screen PHQ 2/9 Scores 11/05/2021 10/16/2021 11/13/2020 10/25/2019 06/19/2015 03/19/2015 02/28/2015  PHQ - 2 Score 0 2 0 1 0 0 0  PHQ- 9 Score 15 12 0 - - 0 -    Fall Risk Fall Risk  11/05/2021 10/16/2021 11/13/2020 10/25/2019 10/03/2019  Falls in the past year? 1 1 1 1 1   Comment - - - - Emmi Telephone Survey: data to providers  prior to load  Number falls in past yr: 1 1 0 1 1  Comment - - - - Emmi Telephone Survey Actual Response = 3  Injury with Fall? 1 1 0 1 0  Comment - went to the ER - - -  Risk for fall due to : History of fall(s) History of fall(s);Impaired balance/gait Impaired mobility;Impaired balance/gait;Impaired vision History of fall(s);Impaired vision;Medication side effect;Impaired balance/gait;Impaired mobility -  Follow up Falls evaluation completed;Follow up appointment Falls evaluation completed;Follow up appointment Falls evaluation completed;Falls prevention discussed Falls evaluation completed;Education provided;Falls prevention discussed -    FALL RISK PREVENTION PERTAINING TO THE HOME:  Any stairs in or around the home? No  If so, are there any without handrails? No  Home free of loose throw rugs in walkways, pet beds, electrical cords, etc? Yes  Adequate lighting in your home to reduce risk of falls? Yes   ASSISTIVE DEVICES UTILIZED TO PREVENT FALLS:  Life alert? No  Use of a cane, walker or w/c? Yes  Grab bars in the bathroom? Yes  Shower chair or bench in shower? Yes  Elevated toilet seat or a handicapped toilet? Yes   TIMED UP AND GO:  Was the test performed? No Patient uses a wheelchair    Cognitive Function:     6CIT Screen 10/25/2019  What Year? 0 points  What month? 0 points  What time? 0 points  Count back from 20 0 points  Months in reverse 0 points  Repeat phrase 4 points  Total Score 4    Immunizations Immunization History  Administered Date(s) Administered   Fluad Quad(high Dose 65+) 12/23/2020, 10/16/2021   Influenza Split 08/02/2013   Influenza, High Dose Seasonal PF 07/30/2015, 07/20/2017, 08/23/2018, 08/10/2019   Influenza-Unspecified 08/08/2014, 07/27/2016   Moderna Sars-Covid-2 Vaccination 02/12/2020, 03/11/2020   Pneumococcal Conjugate-13 12/25/2015   Pneumococcal Polysaccharide-23 08/23/2018    TDAP status: Due, Education has been provided  regarding the importance of this vaccine. Advised may receive this vaccine at local  pharmacy or Health Dept. Aware to provide a copy of the vaccination record if obtained from local pharmacy or Health Dept. Verbalized acceptance and understanding.   Covid-19 vaccine status: Completed vaccines  Qualifies for Shingles Vaccine? Yes   Zostavax completed No   Shingrix Completed?: No.    Education has been provided regarding the importance of this vaccine. Patient has been advised to call insurance company to determine out of pocket expense if they have not yet received this vaccine. Advised may also receive vaccine at local pharmacy or Health Dept. Verbalized acceptance and understanding.  Screening Tests Health Maintenance  Topic Date Due   Hepatitis C Screening  Never done   Zoster Vaccines- Shingrix (1 of 2) Never done   COLONOSCOPY (Pts 45-73yrs Insurance coverage will need to be confirmed)  Never done   TETANUS/TDAP  01/08/2020   COVID-19 Vaccine (4 - Booster for Moderna series) 03/18/2021   MAMMOGRAM  09/06/2023   Pneumonia Vaccine 18+ Years old  Completed   INFLUENZA VACCINE  Completed   DEXA SCAN  Completed   HPV VACCINES  Aged Out    Health Maintenance  Health Maintenance Due  Topic Date Due   Hepatitis C Screening  Never done   Zoster Vaccines- Shingrix (1 of 2) Never done   COLONOSCOPY (Pts 45-72yrs Insurance coverage will need to be confirmed)  Never done   TETANUS/TDAP  01/08/2020   COVID-19 Vaccine (4 - Booster for Moderna series) 03/18/2021    Additional Screening:  Hepatitis C Screening: does qualify; Completed No Patient deferred  Vision Screening: Recommended annual ophthalmology exams for early detection of glaucoma and other disorders of the eye. Is the patient up to date with their annual eye exam?  Yes  Who is the provider or what is the name of the office in which the patient attends annual eye exams? Followed by Dr Arlyn Leak.   Dental Screening:  Recommended annual dental exams for proper oral hygiene  Community Resource Referral / Chronic Care Management:  CRR required this visit?  No   CCM required this visit?  No      Plan:     I have personally reviewed and noted the following in the patients chart:   Medical and social history Use of alcohol, tobacco or illicit drugs  Current medications and supplements including opioid prescriptions.  Functional ability and status Nutritional status Physical activity Advanced directives List of other physicians Hospitalizations, surgeries, and ER visits in previous 12 months Vitals Screenings to include cognitive, depression, and falls Referrals and appointments  In addition, I have reviewed and discussed with patient certain preventive protocols, quality metrics, and best practice recommendations. A written personalized care plan for preventive services as well as general preventive health recommendations were provided to patient.     Criselda Peaches, LPN   12/13/8248

## 2021-11-14 NOTE — Patient Instructions (Addendum)
Audrey Brown , Thank you for taking time to come for your Medicare Wellness Visit. I appreciate your ongoing commitment to your health goals. Please review the following plan we discussed and let me know if I can assist you in the future.   These are the goals we discussed:  Goals      DIET - INCREASE WATER INTAKE     Prevent falls        This is a list of the screening recommended for you and due dates:  Health Maintenance  Topic Date Due   COVID-19 Vaccine (3 - Moderna risk series) 01/21/2021   Zoster (Shingles) Vaccine (1 of 2) 02/12/2022*   Colon Cancer Screening  11/14/2022*   Tetanus Vaccine  11/14/2022*   Hepatitis C Screening: USPSTF Recommendation to screen - Ages 18-79 yo.  11/14/2022*   Mammogram  09/06/2023   Pneumonia Vaccine  Completed   Flu Shot  Completed   DEXA scan (bone density measurement)  Completed   HPV Vaccine  Aged Out  *Topic was postponed. The date shown is not the original due date.   Opioid Pain Medicine Management Opioids are powerful medicines that are used to treat moderate to severe pain. When used for short periods of time, they can help you to: Sleep better. Do better in physical or occupational therapy. Feel better in the first few days after an injury. Recover from surgery. Opioids should be taken with the supervision of a trained health care provider. They should be taken for the shortest period of time possible. This is because opioids can be addictive, and the longer you take opioids, the greater your risk of addiction. This addiction can also be called opioid use disorder. What are the risks? Using opioid pain medicines for longer than 3 days increases your risk of side effects. Side effects include: Constipation. Nausea and vomiting. Breathing difficulties (respiratory depression). Drowsiness. Confusion. Opioid use disorder. Itching. Taking opioid pain medicine for a long period of time can affect your ability to do daily tasks. It  also puts you at risk for: Motor vehicle crashes. Depression. Suicide. Heart attack. Overdose, which can be life-threatening. What is a pain treatment plan? A pain treatment plan is an agreement between you and your health care provider. Pain is unique to each person, and treatments vary depending on your condition. To manage your pain, you and your health care provider need to work together. To help you do this: Discuss the goals of your treatment, including how much pain you might expect to have and how you will manage the pain. Review the risks and benefits of taking opioid medicines. Remember that a good treatment plan uses more than one approach and minimizes the chance of side effects. Be honest about the amount of medicines you take and about any drug or alcohol use. Get pain medicine prescriptions from only one health care provider. Pain can be managed with many types of alternative treatments. Ask your health care provider to refer you to one or more specialists who can help you manage pain through: Physical or occupational therapy. Counseling (cognitive behavioral therapy). Good nutrition. Biofeedback. Massage. Meditation. Non-opioid medicine. Following a gentle exercise program. How to use opioid pain medicine Taking medicine Take your pain medicine exactly as told by your health care provider. Take it only when you need it. If your pain gets less severe, you may take less than your prescribed dose if your health care provider approves. If you are not having pain, do nottake  pain medicine unless your health care provider tells you to take it. If your pain is severe, do nottry to treat it yourself by taking more pills than instructed on your prescription. Contact your health care provider for help. Write down the times when you take your pain medicine. It is easy to become confused while on pain medicine. Writing the time can help you avoid overdose. Take other over-the-counter  or prescription medicines only as told by your health care provider. Keeping yourself and others safe  While you are taking opioid pain medicine: Do not drive, use machinery, or power tools. Do not sign legal documents. Do not drink alcohol. Do not take sleeping pills. Do not supervise children by yourself. Do not do activities that require climbing or being in high places. Do not go to a lake, river, ocean, spa, or swimming pool. Do not share your pain medicine with anyone. Keep pain medicine in a locked cabinet or in a secure area where pets and children cannot reach it. Stopping your use of opioids If you have been taking opioid medicine for more than a few weeks, you may need to slowly decrease (taper) how much you take until you stop completely. Tapering your use of opioids can decrease your risk of symptoms of withdrawal, such as: Pain and cramping in the abdomen. Nausea. Sweating. Sleepiness. Restlessness. Uncontrollable shaking (tremors). Cravings for the medicine. Do not attempt to taper your use of opioids on your own. Talk with your health care provider about how to do this. Your health care provider may prescribe a step-down schedule based on how much medicine you are taking and how long you have been taking it. Getting rid of leftover pills Do not save any leftover pills. Get rid of leftover pills safely by: Taking the medicine to a prescription take-back program. This is usually offered by the county or law enforcement. Bringing them to a pharmacy that has a drug disposal container. Flushing them down the toilet. Check the label or package insert of your medicine to see whether this is safe to do. Throwing them out in the trash. Check the label or package insert of your medicine to see whether this is safe to do. If it is safe to throw it out, remove the medicine from the original container, put it into a sealable bag or container, and mix it with used coffee grounds, food  scraps, dirt, or cat litter before putting it in the trash. Follow these instructions at home: Activity Do exercises as told by your health care provider. Avoid activities that make your pain worse. Return to your normal activities as told by your health care provider. Ask your health care provider what activities are safe for you. General instructions You may need to take these actions to prevent or treat constipation: Drink enough fluid to keep your urine pale yellow. Take over-the-counter or prescription medicines. Eat foods that are high in fiber, such as beans, whole grains, and fresh fruits and vegetables. Limit foods that are high in fat and processed sugars, such as fried or sweet foods. Keep all follow-up visits. This is important. Where to find support If you have been taking opioids for a long time, you may benefit from receiving support for quitting from a local support group or counselor. Ask your health care provider for a referral to these resources in your area. Where to find more information Centers for Disease Control and Prevention (CDC): http://www.wolf.info/ U.[place]. Food and Drug Administration (FDA): [place] Get help  right away if: You may have taken too much of an opioid (overdosed). Common symptoms of an overdose: Your breathing is slower or more shallow than normal. You have a very slow heartbeat (pulse). You have slurred speech. You have nausea and vomiting. Your pupils become very small. You have other potential symptoms: You are very confused. You faint or feel like you will faint. You have cold, clammy skin. You have blue lips or fingernails. You have thoughts of harming yourself or harming others. These symptoms may represent a serious problem that is an emergency. Do not wait to see if the symptoms will go away. Get medical help right away. Call your local emergency services (911 in the [place] Do not drive yourself to the hospital.  If you ever feel like you may  hurt yourself or others, or have thoughts about taking your own life, get help right away. Go to your nearest emergency department or: Call your local emergency services (911 in the [place] Call the [facility] (336) 427-2479 in the [place] Call a suicide crisis helpline, such as the [facility] at 443-855-1130 or 988 in the [place]. This is open 24 hours a day in the [place] Text the Crisis Text Line at 435-212-4398 (in the [place].). Summary Opioid medicines can help you manage moderate to severe pain for a short period of time. A pain treatment plan is an agreement between you and your health care provider. Discuss the goals of your treatment, including how much pain you might expect to have and how you will manage the pain. If you think that you or someone else may have taken too much of an opioid, get medical help right away. This information is not intended to replace advice given to you by your health care provider. Make sure you discuss any questions you have with your health care provider. Document Revised: 05/21/2021 Document Reviewed: 02/05/2021 Elsevier Patient Education  [place].  Advanced directives: Yes  Conditions/risks identified: None  Next appointment: Follow up in one year for your annual wellness visit    Preventive Care 65 Years and Older, Female Preventive care refers to lifestyle choices and visits with your health care provider that can promote health and wellness. What does preventive care include? A yearly physical exam. This is also called an annual well check. Dental exams once or twice a year. Routine eye exams. Ask your health care provider how often you should have your eyes checked. Personal lifestyle choices, including: Daily care of your teeth and gums. Regular physical activity. Eating a healthy diet. Avoiding tobacco and drug use. Limiting alcohol use. Practicing safe sex. Taking low-dose aspirin every  day. Taking vitamin and mineral supplements as recommended by your health care provider. What happens during an annual well check? The services and screenings done by your health care provider during your annual well check will depend on your age, overall health, lifestyle risk factors, and family history of disease. Counseling  Your health care provider may ask you questions about your: Alcohol use. Tobacco use. Drug use. Emotional well-being. Home and relationship well-being. Sexual activity. Eating habits. History of falls. Memory and ability to understand (cognition). Work and work Statistician. Reproductive health. Screening  You may have the following tests or measurements: Height, weight, and BMI. Blood pressure. Lipid and cholesterol levels. These may be checked every 5 years, or more frequently if you are over 52 years old. Skin check. Lung cancer screening. You may have this screening every year  starting at age 24 if you have a 30-pack-year history of smoking and currently smoke or have quit within the past 15 years. Fecal occult blood test (FOBT) of the stool. You may have this test every year starting at age 57. Flexible sigmoidoscopy or colonoscopy. You may have a sigmoidoscopy every 5 years or a colonoscopy every 10 years starting at age 24. Hepatitis C blood test. Hepatitis B blood test. Sexually transmitted disease (STD) testing. Diabetes screening. This is done by checking your blood sugar (glucose) after you have not eaten for a while (fasting). You may have this done every 1-3 years. Bone density scan. This is done to screen for osteoporosis. You may have this done starting at age 44. Mammogram. This may be done every 1-2 years. Talk to your health care provider about how often you should have regular mammograms. Talk with your health care provider about your test results, treatment options, and if necessary, the need for more tests. Vaccines  Your health care  provider may recommend certain vaccines, such as: Influenza vaccine. This is recommended every year. Tetanus, diphtheria, and acellular pertussis (Tdap, Td) vaccine. You may need a Td booster every 10 years. Zoster vaccine. You may need this after age 66. Pneumococcal 13-valent conjugate (PCV13) vaccine. One dose is recommended after age 69. Pneumococcal polysaccharide (PPSV23) vaccine. One dose is recommended after age 31. Talk to your health care provider about which screenings and vaccines you need and how often you need them. This information is not intended to replace advice given to you by your health care provider. Make sure you discuss any questions you have with your health care provider. Document Released: 11/22/2015 Document Revised: 07/15/2016 Document Reviewed: 08/27/2015 Elsevier Interactive Patient Education  2017 [place] Prevention in the Home Falls can cause injuries. They can happen to people of all ages. There are many things you can do to make your home safe and to help prevent falls. What can I do on the outside of my home? Regularly fix the edges of walkways and driveways and fix any cracks. Remove anything that might make you trip as you walk through a door, such as a raised step or threshold. Trim any bushes or trees on the path to your home. Use bright outdoor lighting. Clear any walking paths of anything that might make someone trip, such as rocks or tools. Regularly check to see if handrails are loose or broken. Make sure that both sides of any steps have handrails. Any raised decks and porches should have guardrails on the edges. Have any leaves, snow, or ice cleared regularly. Use sand or salt on walking paths during winter. Clean up any spills in your garage right away. This includes oil or grease spills. What can I do in the bathroom? Use night lights. Install grab bars by the toilet and in the tub and shower. Do not use towel bars as grab  bars. Use non-skid mats or decals in the tub or shower. If you need to sit down in the shower, use a plastic, non-slip stool. Keep the floor dry. Clean up any water that spills on the floor as soon as it happens. Remove soap buildup in the tub or shower regularly. Attach bath mats securely with double-sided non-slip rug tape. Do not have throw rugs and other things on the floor that can make you trip. What can I do in the bedroom? Use night lights. Make sure that you have a light by your bed that is  easy to reach. Do not use any sheets or blankets that are too big for your bed. They should not hang down onto the floor. Have a firm chair that has side arms. You can use this for support while you get dressed. Do not have throw rugs and other things on the floor that can make you trip. What can I do in the kitchen? Clean up any spills right away. Avoid walking on wet floors. Keep items that you use a lot in easy-to-reach places. If you need to reach something above you, use a strong step stool that has a grab bar. Keep electrical cords out of the way. Do not use floor polish or wax that makes floors slippery. If you must use wax, use non-skid floor wax. Do not have throw rugs and other things on the floor that can make you trip. What can I do with my stairs? Do not leave any items on the stairs. Make sure that there are handrails on both sides of the stairs and use them. Fix handrails that are broken or loose. Make sure that handrails are as long as the stairways. Check any carpeting to make sure that it is firmly attached to the stairs. Fix any carpet that is loose or worn. Avoid having throw rugs at the top or bottom of the stairs. If you do have throw rugs, attach them to the floor with carpet tape. Make sure that you have a light switch at the top of the stairs and the bottom of the stairs. If you do not have them, ask someone to add them for you. What else can I do to help prevent  falls? Wear shoes that: Do not have high heels. Have rubber bottoms. Are comfortable and fit you well. Are closed at the toe. Do not wear sandals. If you use a stepladder: Make sure that it is fully opened. Do not climb a closed stepladder. Make sure that both sides of the stepladder are locked into place. Ask someone to hold it for you, if possible. Clearly mark and make sure that you can see: Any grab bars or handrails. First and last steps. Where the edge of each step is. Use tools that help you move around (mobility aids) if they are needed. These include: Canes. Walkers. Scooters. Crutches. Turn on the lights when you go into a dark area. Replace any light bulbs as soon as they burn out. Set up your furniture so you have a clear path. Avoid moving your furniture around. If any of your floors are uneven, fix them. If there are any pets around you, be aware of where they are. Review your medicines with your doctor. Some medicines can make you feel dizzy. This can increase your chance of falling. Ask your doctor what other things that you can do to help prevent falls. This information is not intended to replace advice given to you by your health care provider. Make sure you discuss any questions you have with your health care provider. Document Released: 08/22/2009 Document Revised: 04/02/2016 Document Reviewed: 11/30/2014 Elsevier Interactive Patient Education  2017 [employer].

## 2021-11-18 ENCOUNTER — Ambulatory Visit: Payer: Medicare Other | Admitting: Family Medicine

## 2021-12-18 ENCOUNTER — Telehealth: Payer: Self-pay | Admitting: Family Medicine

## 2021-12-18 NOTE — Telephone Encounter (Signed)
Accessible Collection Service Application to be filled out--placed in dr's folder.  Call [phone] upon completion.

## 2021-12-19 NOTE — Telephone Encounter (Signed)
Pt form placed on Dr Sarajane Jews red folder for completion

## 2021-12-25 DIAGNOSIS — H53141 Visual discomfort, right eye: Secondary | ICD-10-CM | POA: Diagnosis not present

## 2021-12-25 DIAGNOSIS — H4020X0 Unspecified primary angle-closure glaucoma, stage unspecified: Secondary | ICD-10-CM | POA: Diagnosis not present

## 2021-12-25 DIAGNOSIS — H40211 Acute angle-closure glaucoma, right eye: Secondary | ICD-10-CM | POA: Diagnosis not present

## 2021-12-25 DIAGNOSIS — H0288B Meibomian gland dysfunction left eye, upper and lower eyelids: Secondary | ICD-10-CM | POA: Diagnosis not present

## 2021-12-25 DIAGNOSIS — H04123 Dry eye syndrome of bilateral lacrimal glands: Secondary | ICD-10-CM | POA: Diagnosis not present

## 2021-12-25 DIAGNOSIS — H35371 Puckering of macula, right eye: Secondary | ICD-10-CM | POA: Diagnosis not present

## 2021-12-25 DIAGNOSIS — H40032 Anatomical narrow angle, left eye: Secondary | ICD-10-CM | POA: Diagnosis not present

## 2021-12-25 DIAGNOSIS — H35373 Puckering of macula, bilateral: Secondary | ICD-10-CM | POA: Diagnosis not present

## 2021-12-25 DIAGNOSIS — H0288A Meibomian gland dysfunction right eye, upper and lower eyelids: Secondary | ICD-10-CM | POA: Diagnosis not present

## 2021-12-25 DIAGNOSIS — Z961 Presence of intraocular lens: Secondary | ICD-10-CM | POA: Diagnosis not present

## 2021-12-25 NOTE — Telephone Encounter (Signed)
Pt form is completed and pt notified to pick up form from the front office, pt state that she will have someone pick it up today or tomorrow, a copy was  sent to scanning. Pt copy was placed in the cabinet at the front office

## 2022-01-07 DIAGNOSIS — L304 Erythema intertrigo: Secondary | ICD-10-CM | POA: Diagnosis not present

## 2022-01-07 DIAGNOSIS — L820 Inflamed seborrheic keratosis: Secondary | ICD-10-CM | POA: Diagnosis not present

## 2022-01-07 DIAGNOSIS — L538 Other specified erythematous conditions: Secondary | ICD-10-CM | POA: Diagnosis not present

## 2022-01-07 DIAGNOSIS — L821 Other seborrheic keratosis: Secondary | ICD-10-CM | POA: Diagnosis not present

## 2022-01-07 DIAGNOSIS — R208 Other disturbances of skin sensation: Secondary | ICD-10-CM | POA: Diagnosis not present

## 2022-01-07 DIAGNOSIS — D485 Neoplasm of uncertain behavior of skin: Secondary | ICD-10-CM | POA: Diagnosis not present

## 2022-01-29 ENCOUNTER — Other Ambulatory Visit: Payer: Self-pay | Admitting: Family Medicine

## 2022-01-29 ENCOUNTER — Encounter: Payer: Self-pay | Admitting: Family Medicine

## 2022-01-29 ENCOUNTER — Ambulatory Visit (INDEPENDENT_AMBULATORY_CARE_PROVIDER_SITE_OTHER): Payer: Medicare Other | Admitting: Family Medicine

## 2022-01-29 VITALS — BP 142/90 | HR 81 | Temp 98.6°F

## 2022-01-29 DIAGNOSIS — J40 Bronchitis, not specified as acute or chronic: Secondary | ICD-10-CM

## 2022-01-29 MED ORDER — ALBUTEROL SULFATE HFA 108 (90 BASE) MCG/ACT IN AERS: 2.0000 | INHALATION_SPRAY | RESPIRATORY_TRACT | 3 refills | Status: AC | PRN

## 2022-01-29 MED ORDER — AZITHROMYCIN 250 MG PO TABS: ORAL_TABLET | ORAL | 0 refills | Status: DC

## 2022-01-29 MED ORDER — ALPRAZOLAM 0.25 MG PO TABS: ORAL_TABLET | ORAL | 2 refills | Status: DC

## 2022-01-29 NOTE — Progress Notes (Signed)
? ?  Subjective:  ? ? Patient ID: Audrey Brown, female    DOB: [birth year], 76 y.o.   MRN: [id number] ? ?HPI ?Here for one week of chest congestion, wheezing, and coughing up green sputum. No fever or chest pain or SOB. She has used her inhaler a few times.  ? ? ?Review of Systems  ?Constitutional: Negative.   ?HENT: Negative.    ?Eyes: Negative.   ?Respiratory:  Positive for cough, chest tightness and wheezing. Negative for shortness of breath.   ?Cardiovascular: Negative.   ?Gastrointestinal: Negative.   ? ?   ?Objective:  ? Physical Exam ?Constitutional:   ?   Appearance: She is not ill-appearing.  ?   Comments: In her wheelchair   ?HENT:  ?   Right Ear: Tympanic membrane, ear canal and external ear normal.  ?   Left Ear: Tympanic membrane, ear canal and external ear normal.  ?   Nose: Nose normal.  ?   Mouth/Throat:  ?   Pharynx: Oropharynx is clear.  ?Eyes:  ?   Conjunctiva/sclera: Conjunctivae normal.  ?Pulmonary:  ?   Effort: Pulmonary effort is normal.  ?   Breath sounds: No rhonchi or rales.  ?   Comments: Scattered wheezes  ?Lymphadenopathy:  ?   Cervical: No cervical adenopathy.  ?Neurological:  ?   Mental Status: She is alert.  ? ? ? ? ? ?   ?Assessment & Plan:  ?Bronchitis. Treat with a Zpack. Recheck as needed.  ?Alysia Penna, MD ? ? ?

## 2022-01-30 ENCOUNTER — Telehealth: Payer: Self-pay | Admitting: Family Medicine

## 2022-01-30 ENCOUNTER — Telehealth: Payer: Self-pay

## 2022-01-30 MED ORDER — TRAMADOL HCL 50 MG PO TABS: 100.0000 mg | ORAL_TABLET | Freq: Four times a day (QID) | ORAL | 0 refills | Status: DC | PRN

## 2022-01-30 NOTE — Telephone Encounter (Signed)
Done

## 2022-01-30 NOTE — Telephone Encounter (Signed)
Patient called in stating that she received a message this morning from [facility] informing her that they don't have any traMADol (ULTRAM) 50 MG tablet [[id number]]  in stock right now. Patient is asking if Dr.Fry could prescribe her something similar and send it to her pharmacy. ? ?Pharmacy:  ? ?[name], [place]  ?[address], [place] [address]  ? ? ?Please advise. ?

## 2022-01-30 NOTE — Telephone Encounter (Signed)
Left a detailed message for pt advising that her paperwork is complete and ready for pick up at the office ?

## 2022-01-30 NOTE — Telephone Encounter (Signed)
Not able to send this since its a control ?

## 2022-01-30 NOTE — Telephone Encounter (Signed)
There really is no substitute for Tramadol. I suggest we send in a 30 day refill to her local pharmacy to give [facility] time to get more in  ?

## 2022-01-30 NOTE — Telephone Encounter (Signed)
Pt LOV was on 01/29/2022 ?Last refill was done on 10/16/2021 ?Please advise ?

## 2022-02-04 ENCOUNTER — Telehealth: Payer: Self-pay | Admitting: Family Medicine

## 2022-02-04 DIAGNOSIS — H35373 Puckering of macula, bilateral: Secondary | ICD-10-CM | POA: Diagnosis not present

## 2022-02-04 DIAGNOSIS — H35371 Puckering of macula, right eye: Secondary | ICD-10-CM | POA: Diagnosis not present

## 2022-02-04 DIAGNOSIS — H04123 Dry eye syndrome of bilateral lacrimal glands: Secondary | ICD-10-CM | POA: Diagnosis not present

## 2022-02-04 DIAGNOSIS — Z961 Presence of intraocular lens: Secondary | ICD-10-CM | POA: Diagnosis not present

## 2022-02-04 DIAGNOSIS — H0288A Meibomian gland dysfunction right eye, upper and lower eyelids: Secondary | ICD-10-CM | POA: Diagnosis not present

## 2022-02-04 DIAGNOSIS — Z9841 Cataract extraction status, right eye: Secondary | ICD-10-CM | POA: Diagnosis not present

## 2022-02-04 DIAGNOSIS — H40211 Acute angle-closure glaucoma, right eye: Secondary | ICD-10-CM | POA: Diagnosis not present

## 2022-02-04 DIAGNOSIS — Z9842 Cataract extraction status, left eye: Secondary | ICD-10-CM | POA: Diagnosis not present

## 2022-02-04 DIAGNOSIS — H53141 Visual discomfort, right eye: Secondary | ICD-10-CM | POA: Diagnosis not present

## 2022-02-04 DIAGNOSIS — H0288B Meibomian gland dysfunction left eye, upper and lower eyelids: Secondary | ICD-10-CM | POA: Diagnosis not present

## 2022-02-04 DIAGNOSIS — H40032 Anatomical narrow angle, left eye: Secondary | ICD-10-CM | POA: Diagnosis not present

## 2022-02-04 NOTE — Telephone Encounter (Signed)
Patient called in stating that the prescription that was prescribed to her for bronchitis did not work. Patient is requesting a phone call back on what her next steps should be. ? ?Please advise. ?

## 2022-02-04 NOTE — Telephone Encounter (Signed)
Pt states she continues to have non productive cough, nasal congestion with green mucous noted, h/a, & "slight fever" with highest 99 in last few days. Pt states she is having SOB that has gotten worse since she saw Dr Sarajane Jews on 01/29/22. She states she has completed the zpack & uses inhaler daily.  ?Given symptoms, pt advised to seek in person treatment. PCP has no available appts tomorrow; appt scheduled with Dr Volanda Napoleon for 8:30 Mar 30. Pt verb understanding. ?

## 2022-02-05 ENCOUNTER — Telehealth: Payer: Self-pay

## 2022-02-05 ENCOUNTER — Encounter: Payer: Self-pay | Admitting: Family Medicine

## 2022-02-05 ENCOUNTER — Ambulatory Visit (INDEPENDENT_AMBULATORY_CARE_PROVIDER_SITE_OTHER): Payer: Medicare Other | Admitting: Family Medicine

## 2022-02-05 VITALS — BP 191/88 | HR 70 | Temp 98.3°F

## 2022-02-05 DIAGNOSIS — R413 Other amnesia: Secondary | ICD-10-CM

## 2022-02-05 DIAGNOSIS — J342 Deviated nasal septum: Secondary | ICD-10-CM | POA: Diagnosis not present

## 2022-02-05 DIAGNOSIS — J0140 Acute pansinusitis, unspecified: Secondary | ICD-10-CM

## 2022-02-05 DIAGNOSIS — J40 Bronchitis, not specified as acute or chronic: Secondary | ICD-10-CM | POA: Diagnosis not present

## 2022-02-05 DIAGNOSIS — I10 Essential (primary) hypertension: Secondary | ICD-10-CM | POA: Diagnosis not present

## 2022-02-05 DIAGNOSIS — H6121 Impacted cerumen, right ear: Secondary | ICD-10-CM | POA: Diagnosis not present

## 2022-02-05 MED ORDER — DOXYCYCLINE HYCLATE 100 MG PO TABS: 100.0000 mg | ORAL_TABLET | Freq: Two times a day (BID) | ORAL | 0 refills | Status: AC

## 2022-02-05 NOTE — Progress Notes (Signed)
Subjective:  ? ? Patient ID: Audrey Brown, female    DOB: 07/13/1946, 76 y.o.   MRN: [id number] ? ?Chief Complaint  ?Patient presents with  ? Cough  ?  Persistent cough with SOB, cough started a little over 2 weeks ago. Saw PCP was given zpak, took it all, still has cough. Had facial swelling around the eye, went to eye doctor and was informed it may be a sinus infection  ?Patient accompanied by her daughter. ? ?HPI ?Patient is a 76 year old female with pmh sig for arthritis, bipolar depression, glaucoma, Parkinson's disease, short-term memory loss followed by Dr. Sarajane Jews and seen today for ongoing concern.  Patient seen 3/23 by PCP, dx'd with bronchitis, given Z-Pak.  Seen by Ophthalmology yesterday for right eye pain and headache, advised to f/u with pcp as maybe a sinus infection.  Pt endorses increased cough, SOB, HAs, nasal drainage, pain/pressure behind R eye and of R cheek, sore throat, and elevated temperature for her (98.46F).  Patient denies ear pain/pressure, dental pain/pressure.  Taking Robitussin and ibuprofen as needed.  Using albuterol inhaler. ? ?Past Medical History:  ?Diagnosis Date  ? Arthritis   ? lower back  ? Bipolar disorder ([place])   ? Depression   ? GERD (gastroesophageal reflux disease)   ? Glaucoma   ? bilateral  ? Hiatal hernia   ? Loss of vision   ? right eye  ? Parkinson disease ([place])   ? Poor short-term memory   ? Sagittal band rupture at metacarpophalangeal joint 11/2016  ? right long finger  ? ? ?Allergies  ?Allergen Reactions  ? Celebrex [Celecoxib] Hives  ? Morphine And Related Hives  ? Penicillins Hives, Nausea Only and Other (See Comments)  ?  Has patient had a PCN reaction causing immediate rash, facial/tongue/throat swelling, SOB or lightheadedness with hypotension: Yes ?Has patient had a PCN reaction causing severe rash involving mucus membranes or skin necrosis: Yes ?Has patient had a PCN reaction that required hospitalization: Yes, was already in the hospital  ?Has patient had a  PCN reaction occurring within the last 10 years: No ?If all of the above answers are "NO", then may proceed with Cephalosporin use. ?  ? Risperdal [Risperidone] Other (See Comments)  ?  Tremors and seizures  ? Statins Other (See Comments)  ?  MUSCLE ACHES  ? Lisinopril Cough  ? Hydrocodone Hives  ?  Pt state that she takes it with Benadryl  ? ? ?ROS ?General: Denies fever, chills, night sweats, changes in weight, changes in appetite + short-term memory loss ?HEENT: Denies headaches, ear pain, changes in vision  + headache, facial pain/pressure, nasal congestion, sore throat ?CV: Denies CP, palpitations, SOB, orthopnea + SOB ?Pulm: Denies wheezing + cough, shortness of breath ?GI: Denies abdominal pain, nausea, vomiting, diarrhea, constipation ?GU: Denies dysuria, hematuria, frequency, vaginal discharge ?Msk: Denies muscle cramps, joint pains ?Neuro: Denies weakness, numbness, tingling ?Skin: Denies rashes, bruising ?Psych: Denies depression, anxiety, hallucinations ? ?   ?Objective:  ?  ?Blood pressure (!) 191/88, pulse 70, temperature 98.3 ?F (36.8 ?C), temperature source Oral, SpO2 97 %. ? ? ?Gen. Pleasant, well-nourished, in no distress, normal affect   ?HEENT: [place]/AT, face symmetric, wearing sun visor underneath glasses, conjunctiva clear, no scleral icterus, PERRLA, EOMI, nares patent without drainage, pharynx without erythema or exudate.  Left TM normal.  Right canal occluded with soft to moderate consistency cerumen, unable to visualize right TM. ?Lungs: Intermittent dry cough no accessory muscle use, CTAB, no wheezes or  rales ?Cardiovascular: RRR, no m/r/g, no peripheral edema ?Musculoskeletal: No deformities, no cyanosis or clubbing, normal tone ?Neuro:  A&Ox3, CN II-XII intact, sitting in motorized scooter ?Skin:  Warm, no lesions/ rash ? ? ?Wt Readings from Last 3 Encounters:  ?11/14/21 185 lb (83.9 kg)  ?10/04/21 180 lb (81.6 kg)  ?03/30/21 180 lb (81.6 kg)  ? ? ?Lab Results  ?Component Value Date  ? WBC  9.3 03/30/2021  ? HGB 14.2 03/30/2021  ? HCT 43.5 03/30/2021  ? PLT 382 03/30/2021  ? GLUCOSE 103 (H) 03/30/2021  ? CHOL 225 (H) 12/25/2015  ? TRIG 91.0 12/25/2015  ? HDL 59.50 12/25/2015  ? LDLCALC 147 (H) 12/25/2015  ? ALT 13 03/30/2021  ? AST 13 (L) 03/30/2021  ? NA 139 03/30/2021  ? K 4.0 03/30/2021  ? CL 106 03/30/2021  ? CREATININE 0.85 03/30/2021  ? BUN 11 03/30/2021  ? CO2 24 03/30/2021  ? TSH 1.83 04/01/2018  ? INR 0.93 04/12/2011  ? ? ?Assessment/Plan: ? ?Acute pansinusitis, recurrence not specified  ?-s/p azithromycin for bronchitis ?-Saline nasal rinse and/or Flonase nasal spray ?- Plan: doxycycline (VIBRA-TABS) 100 MG tablet ? ?Impacted cerumen of right ear ?-Right canal occluded with cerumen on exam, unable to visualize right TM ?-OTC Debrox eardrops ? ?Deviated septum ?-Saline nasal rinse or Flonase nasal spray ? ?Bronchitis ?-Reassuring lungs clear on exam ?-Continue albuterol inhaler as needed and OTC cough/cold medication ?-Advised cough may linger for several weeks ? ?Poor short term memory ? ?Essential hypertension ?-Uncontrolled likely 2/2 coughing ?-Recheck ?-Continue current BP meds including losartan, Lasix ?-Continue to monitor ? ?F/u as needed ? ?Grier Mitts, MD ?

## 2022-02-05 NOTE — Patient Instructions (Signed)
Consider obtaining over-the-counter Debrox eardrops to help remove the wax from your right ear. ? ?A prescription for doxycycline was sent to your pharmacy for your sinus infection.  You can also use saline nasal rinse or Flonase nasal spray to help with your symptoms. ? ?You can also gargle with warm salt water or Chloraseptic spray. ?

## 2022-02-05 NOTE — Telephone Encounter (Signed)
Pt picked up the [facility] form from the office on 02/05/2022, form handed to [place] our front office staff, a copy was sent to scanning ?

## 2022-02-23 ENCOUNTER — Emergency Department (HOSPITAL_COMMUNITY)
Admission: EM | Admit: 2022-02-23 | Discharge: 2022-02-23 | Discharge status: Against Medical Advice | Payer: Medicare Other | Attending: Emergency Medicine | Admitting: Emergency Medicine

## 2022-02-23 ENCOUNTER — Encounter (HOSPITAL_COMMUNITY): Payer: Self-pay

## 2022-02-23 ENCOUNTER — Emergency Department (HOSPITAL_COMMUNITY): Payer: Medicare Other

## 2022-02-23 DIAGNOSIS — R079 Chest pain, unspecified: Secondary | ICD-10-CM | POA: Diagnosis not present

## 2022-02-23 DIAGNOSIS — Z5321 Procedure and treatment not carried out due to patient leaving prior to being seen by health care provider: Secondary | ICD-10-CM | POA: Insufficient documentation

## 2022-02-23 DIAGNOSIS — R0602 Shortness of breath: Secondary | ICD-10-CM | POA: Insufficient documentation

## 2022-02-23 DIAGNOSIS — R059 Cough, unspecified: Secondary | ICD-10-CM | POA: Diagnosis not present

## 2022-02-23 DIAGNOSIS — I10 Essential (primary) hypertension: Secondary | ICD-10-CM | POA: Diagnosis not present

## 2022-02-23 DIAGNOSIS — R0789 Other chest pain: Secondary | ICD-10-CM | POA: Diagnosis not present

## 2022-02-23 LAB — CBC
HCT: 42.5 % (ref 36.0–46.0)
Hemoglobin: 13.9 g/dL (ref 12.0–15.0)
MCH: 29.2 pg (ref 26.0–34.0)
MCHC: 32.7 g/dL (ref 30.0–36.0)
MCV: 89.3 fL (ref 80.0–100.0)
Platelets: 319 10*3/uL (ref 150–400)
RBC: 4.76 MIL/uL (ref 3.87–5.11)
RDW: 12.5 % (ref 11.5–15.5)
WBC: 9.8 10*3/uL (ref 4.0–10.5)
nRBC: 0 % (ref 0.0–0.2)

## 2022-02-23 LAB — BASIC METABOLIC PANEL
Anion gap: 7 (ref 5–15)
BUN: 17 mg/dL (ref 8–23)
CO2: 25 mmol/L (ref 22–32)
Calcium: 8.8 mg/dL — ABNORMAL LOW (ref 8.9–10.3)
Chloride: 106 mmol/L (ref 98–111)
Creatinine, Ser: 0.84 mg/dL (ref 0.44–1.00)
GFR, Estimated: >60 mL/min (ref >60)
Glucose, Bld: 97 mg/dL (ref 70–99)
Potassium: 3.9 mmol/L (ref 3.5–5.1)
Sodium: 138 mmol/L (ref 135–145)

## 2022-02-23 LAB — TROPONIN I (HIGH SENSITIVITY)
Troponin I (High Sensitivity): 3 ng/L (ref <18)
Troponin I (High Sensitivity): 3 ng/L (ref <18)

## 2022-02-23 NOTE — ED Provider Triage Note (Signed)
Emergency Medicine Provider Triage Evaluation Note ? ?Audrey Brown , a 76 y.o. female  was evaluated in triage.  Pt complains of worsening cough, shortness of breath and chest pain.  Patient was recently diagnosed with bronchitis and has been on multiple antibiotics for pansinusitis, and most recently saw her PCP 3 days ago.  Over the weekend, her symptoms continue to worsen and now she states it feels like a "brick is sitting on her chest". ? ?Review of Systems  ?Positive: As above ?Negative: Urinary symptoms, abdominal pain, nausea, vomiting, diarrhea, fevers and chills ? ?Physical Exam  ?BP (!) 168/90 (BP Location: Right Arm)   Pulse 67   Temp 98.5 ?F (36.9 ?C) (Oral)   Resp 14   SpO2 96%  ?Gen:   Awake, trembling, ?Resp:  Increased respiratory effort, SPO2 at 96% on room air, lungs CTA bilaterally ?MSK:   Moves extremities without difficulty  ?Other:   ? ?Medical Decision Making  ?Medically screening exam initiated at 4:00 PM.  Appropriate orders placed.  Audrey Brown was informed that the remainder of the evaluation will be completed by another provider, this initial triage assessment does not replace that evaluation, and the importance of remaining in the ED until their evaluation is complete. ? ? ?  ?Tonye Pearson, [place] ?02/23/22 5643 ? ?

## 2022-02-23 NOTE — ED Triage Notes (Signed)
Pt arrived via EMS, states she has had cough, recently dx with bronchitis. States she feels like there is a brick sitting on her chest.  ?

## 2022-03-02 ENCOUNTER — Encounter: Payer: Self-pay | Admitting: Family Medicine

## 2022-03-02 ENCOUNTER — Ambulatory Visit (INDEPENDENT_AMBULATORY_CARE_PROVIDER_SITE_OTHER): Payer: Medicare Other | Admitting: Family Medicine

## 2022-03-02 ENCOUNTER — Other Ambulatory Visit: Payer: Self-pay | Admitting: Family Medicine

## 2022-03-02 VITALS — BP 130/84 | HR 63 | Temp 98.5°F

## 2022-03-02 DIAGNOSIS — J40 Bronchitis, not specified as acute or chronic: Secondary | ICD-10-CM | POA: Diagnosis not present

## 2022-03-02 MED ORDER — LEVOFLOXACIN 500 MG PO TABS
500.0000 mg | ORAL_TABLET | Freq: Every day | ORAL | 0 refills | Status: AC
Start: 2022-03-02 — End: 2022-03-12

## 2022-03-02 MED ORDER — METHYLPREDNISOLONE 4 MG PO TBPK: ORAL_TABLET | ORAL | 0 refills | Status: DC

## 2022-03-02 NOTE — Progress Notes (Signed)
? ?  Subjective:  ? ? Patient ID: JAMERICA SNAVELY, female    DOB: 1946-02-22, 76 y.o.   MRN: [id number] ? ?HPI ?Here to follow up an ED visit on 02-24-20 for a deep cough. She has been treated for a sinus infection in the past 2 months. She was given a Zpack which did not work and then a course of Doxycycline, which worked well. Her sinuses cleared up and she felt fine until a week ago, when she began to have a cough which produces yellow sputum and wheezing. No chest pain or SOB. At the ED her WBC was normal at 9.8 and her CXR was clear. She was told to follow up with [place]. Now she still has the deep cough. She has been using her inhaler. No fever.  ? ? ?Review of Systems  ?Constitutional: Negative.   ?HENT: Negative.    ?Eyes: Negative.   ?Respiratory:  Positive for cough and wheezing.   ?Cardiovascular: Negative.   ? ?   ?Objective:  ? Physical Exam ?Constitutional:   ?   Appearance: Normal appearance. She is not ill-appearing.  ?Cardiovascular:  ?   Rate and Rhythm: Normal rate and regular rhythm.  ?   Pulses: Normal pulses.  ?   Heart sounds: Normal heart sounds.  ?Pulmonary:  ?   Effort: Pulmonary effort is normal.  ?   Breath sounds: Wheezing present. No rhonchi or rales.  ?Lymphadenopathy:  ?   Cervical: No cervical adenopathy.  ?Neurological:  ?   Mental Status: She is alert.  ? ? ? ? ? ?   ?Assessment & Plan:  ?Bronchitis, treat with Levaquin and a Medrol dose pack. We spent a total of (33   ) minutes reviewing records and discussing these issues.  ? ?Alysia Penna, MD ? ? ?

## 2022-03-09 ENCOUNTER — Other Ambulatory Visit: Payer: Self-pay

## 2022-03-09 MED ORDER — AMBULATORY NON FORMULARY MEDICATION: 10.0000 mg | Freq: Three times a day (TID) | 0 refills | Status: DC

## 2022-03-13 ENCOUNTER — Other Ambulatory Visit: Payer: Self-pay | Admitting: Family Medicine

## 2022-03-13 NOTE — Telephone Encounter (Signed)
Pt LOV was on 03/02/2022 ?Last refill for Tramadol was 01/30/2022 ?Last refill for Promethazine was 04/25/2021 ?Please advise ?

## 2022-03-18 ENCOUNTER — Encounter (HOSPITAL_COMMUNITY): Payer: Self-pay

## 2022-03-18 ENCOUNTER — Emergency Department (HOSPITAL_COMMUNITY): Payer: Medicare Other

## 2022-03-18 ENCOUNTER — Other Ambulatory Visit: Payer: Self-pay

## 2022-03-18 ENCOUNTER — Emergency Department (HOSPITAL_COMMUNITY)
Admission: EM | Admit: 2022-03-18 | Discharge: 2022-03-18 | Disposition: A | Discharge status: Home / Self Care | Payer: Medicare Other | Attending: Emergency Medicine | Admitting: Emergency Medicine

## 2022-03-18 DIAGNOSIS — M189 Osteoarthritis of first carpometacarpal joint, unspecified: Secondary | ICD-10-CM | POA: Diagnosis not present

## 2022-03-18 DIAGNOSIS — W050XXA Fall from non-moving wheelchair, initial encounter: Secondary | ICD-10-CM | POA: Diagnosis not present

## 2022-03-18 DIAGNOSIS — G20 Parkinson's disease: Secondary | ICD-10-CM | POA: Diagnosis not present

## 2022-03-18 DIAGNOSIS — W19XXXA Unspecified fall, initial encounter: Secondary | ICD-10-CM | POA: Diagnosis not present

## 2022-03-18 DIAGNOSIS — M25532 Pain in left wrist: Secondary | ICD-10-CM | POA: Diagnosis not present

## 2022-03-18 DIAGNOSIS — S52612A Displaced fracture of left ulna styloid process, initial encounter for closed fracture: Secondary | ICD-10-CM | POA: Diagnosis not present

## 2022-03-18 DIAGNOSIS — S52352A Displaced comminuted fracture of shaft of radius, left arm, initial encounter for closed fracture: Secondary | ICD-10-CM | POA: Insufficient documentation

## 2022-03-18 DIAGNOSIS — S6992XA Unspecified injury of left wrist, hand and finger(s), initial encounter: Secondary | ICD-10-CM | POA: Diagnosis not present

## 2022-03-18 DIAGNOSIS — M25539 Pain in unspecified wrist: Secondary | ICD-10-CM | POA: Diagnosis not present

## 2022-03-18 DIAGNOSIS — I10 Essential (primary) hypertension: Secondary | ICD-10-CM | POA: Diagnosis not present

## 2022-03-18 DIAGNOSIS — S52592A Other fractures of lower end of left radius, initial encounter for closed fracture: Secondary | ICD-10-CM | POA: Diagnosis not present

## 2022-03-18 MED ORDER — HYDROCODONE-ACETAMINOPHEN 5-325 MG PO TABS
1.0000 | ORAL_TABLET | Freq: Once | ORAL | Status: AC
  Administered 2022-03-18: 1 via ORAL
  Filled 2022-03-18: qty 1

## 2022-03-18 MED ORDER — FENTANYL CITRATE PF 50 MCG/ML IJ SOSY
50.0000 ug | PREFILLED_SYRINGE | Freq: Once | INTRAMUSCULAR | Status: AC
  Administered 2022-03-18: 50 ug via INTRAVENOUS
  Filled 2022-03-18: qty 1

## 2022-03-18 MED ORDER — HYDROCODONE-ACETAMINOPHEN 5-325 MG PO TABS: 1.0000 | ORAL_TABLET | ORAL | 0 refills | Status: AC | PRN

## 2022-03-18 MED ORDER — DIPHENHYDRAMINE HCL 25 MG PO CAPS
25.0000 mg | ORAL_CAPSULE | Freq: Once | ORAL | Status: AC
  Administered 2022-03-18: 25 mg via ORAL
  Filled 2022-03-18: qty 1

## 2022-03-18 NOTE — ED Triage Notes (Signed)
Patient BIB EMS from home. Patient transferring to Wheelchair when she fell on the left side. Deformity to the left wrist. Patient c/o left shoulder and leg pain. No LOC. Patient is not on blood thinners. EMS placed 20g in right FA and gave 100 fentanyl. ?

## 2022-03-18 NOTE — Discharge Instructions (Addendum)
We discussed the results of your x-ray on today's visit.  Your left wrist was placed on a splint, this should be kept in place until you follow-up with our hand specialist. ? ?Dr. Lequita Asal phone number is attached your chart, please schedule an appointment for further management. ? ?I have prescribed a short course of pain medication such as Norco, you are currently taking tramadol already so please be sure as to not over sedate.  You did voice hives to this medication, however not when taking with Benadryl. ? ?If you experience any worsening symptoms, worsening pain you will need to return to emergency department. ?

## 2022-03-18 NOTE — ED Provider Notes (Signed)
?[facility] DEPT ?Provider Note ? ? ?CSN: [id number] ?Arrival date & time: 03/18/22  1558 ? ?  ? ?History ?No chief complaint on file. ? ? ?Audrey Brown is a 76 y.o. female. ? ?76 y.o female with a PMH Parkinson's presents to the ED via EMS status post fall.  Patient reports she was transferring from her "comfy chair ", onto her wheelchair when she suddenly felt her knees buckle and fell trying to catch herself with the left arm.  She does have pain along the left wrist exacerbated with any movement.  She was able to call EMS while on the ground, helped from the floor by EMS.  She does report the buckling of her knees occurs often due to her underlying Parkinson's.  She did not strike her head, is currently on no blood thinners.  She was given 100 makes of fentanyl by EMS with some improvement in her symptoms.  She denies any other injury or complaint. ? ?The history is provided by the patient and the EMS personnel.  ? ?  ? ?Home Medications ?Prior to Admission medications   ?Medication Sig Start Date End Date Taking? Authorizing Provider  ?HYDROcodone-acetaminophen (NORCO/VICODIN) 5-325 MG tablet Take 1 tablet by mouth every 4 (four) hours as needed for up to 3 days. 03/18/22 03/21/22 Yes Margery Szostak, Beverley Fiedler, PA-C  ?albuterol (VENTOLIN HFA) 108 (90 Base) MCG/ACT inhaler Inhale 2 puffs into the lungs every 4 (four) hours as needed for wheezing or shortness of breath. 01/29/22   Laurey Morale, MD  ?ALPRAZolam Duanne Moron) 0.25 MG tablet TAKE 1 TABLET THREE TIMES A DAY AS NEEDED FOR ANXIETY 01/29/22   Laurey Morale, MD  ?AMBULATORY NON FORMULARY MEDICATION Take 10 mg by mouth 3 (three) times daily. Medication Name: Domperidone 10 mg 03/09/22   Esterwood, Amy S, PA-C  ?buPROPion (WELLBUTRIN XL) 300 MG 24 hr tablet Take 300 mg by mouth daily.  03/27/16   [provider]  ?busPIRone (BUSPAR) 10 MG tablet Take 10 mg by mouth 2 (two) times daily.     [provider]  ?Calcium  Carb-Cholecalciferol 500-100 MG-UNIT CHEW Chew by mouth.    [provider]  ?cyclobenzaprine (FLEXERIL) 10 MG tablet TAKE 1 TABLET THREE TIMES A DAY AS NEEDED FOR MUSCLE SPASMS 01/30/22   Laurey Morale, MD  ?esomeprazole (NEXIUM) 40 MG capsule Take 1 capsule (40 mg total) by mouth 2 (two) times daily before a meal. 03/13/21   Esterwood, Amy S, PA-C  ?eszopiclone (LUNESTA) 1 MG TABS tablet Take 1 tablet (1 mg total) by mouth at bedtime as needed for sleep. Take immediately before bedtime 12/23/20   Laurey Morale, MD  ?furosemide (LASIX) 20 MG tablet TAKE 1 TABLET DAILY AS NEEDED FOR FLUID 09/19/21   Laurey Morale, MD  ?ibuprofen (ADVIL) 800 MG tablet TAKE 1 TABLET EVERY 8 HOURS AS NEEDED FOR PAIN 03/02/22   Laurey Morale, MD  ?LamoTRIgine XR (LAMICTAL XR) 200 MG TB24 Take 1 tablet (200 mg total) by mouth daily. 12/15/13   Laurey Morale, MD  ?losartan (COZAAR) 50 MG tablet TAKE 1 TABLET(50 MG) BY MOUTH DAILY 09/11/21   Laurey Morale, MD  ?Magnesium 500 MG CAPS Take 400 mg by mouth at bedtime.     [provider]  ?Melatonin 5 MG CAPS Take 5 mg by mouth at bedtime.    [provider]  ?methylPREDNISolone (MEDROL DOSEPAK) 4 MG TBPK tablet As directed 03/02/22   Alysia Penna  A, MD  ?OSCIMIN 0.125 MG SUBL PLACE 1 TABLET UNDER THE TONGUE EVERY 6 HOURS AS NEEDED FOR CRAMPING ?Patient taking differently: Place 0.125 mg under the tongue every 6 (six) hours as needed (cramping). 02/09/17   Pyrtle, Lajuan Lines, MD  ?oxybutynin (DITROPAN-XL) 10 MG 24 hr tablet Take 1 tablet (10 mg total) by mouth at bedtime. 07/07/21   Laurey Morale, MD  ?promethazine (PHENERGAN) 25 MG tablet TAKE 1 TABLET EVERY 4 HOURS AS NEEDED FOR NAUSEA 03/16/22   Laurey Morale, MD  ?temazepam (RESTORIL) 30 MG capsule Take 30 mg by mouth at bedtime.    [provider]  ?traMADol (ULTRAM) 50 MG tablet TAKE 2 TABLETS EVERY 6 HOURS AS NEEDED FOR MODERATE PAIN 03/16/22   Laurey Morale, MD  ?   ? ?Allergies    ?Celebrex [celecoxib],  Morphine and related, Penicillins, Risperdal [risperidone], Statins, Lisinopril, and Hydrocodone   ? ?Review of Systems   ?Review of Systems  ?Constitutional:  Negative for chills and fever.  ?Respiratory:  Negative for shortness of breath.   ?Cardiovascular:  Negative for chest pain.  ?Gastrointestinal:  Negative for abdominal pain.  ?Musculoskeletal:  Positive for arthralgias.  ?All other systems reviewed and are negative. ? ?Physical Exam ?Updated Vital Signs ?BP (!) 162/122   Pulse 70   Temp 98.1 ?F (36.7 ?C) (Oral)   Resp 13   Ht '5\' 3"'$  (1.6 m)   Wt 81.6 kg   SpO2 95%   BMI 31.89 kg/m?  ?Physical Exam ?Vitals and nursing note reviewed.  ?Constitutional:   ?   Appearance: Normal appearance.  ?HENT:  ?   Head: Normocephalic and atraumatic.  ?   Nose: Nose normal.  ?Eyes:  ?   Pupils: Pupils are equal, round, and reactive to light.  ?Cardiovascular:  ?   Rate and Rhythm: Normal rate.  ?Pulmonary:  ?   Effort: Pulmonary effort is normal.  ?   Breath sounds: No wheezing.  ?Abdominal:  ?   General: Abdomen is flat.  ?Musculoskeletal:  ?   Left wrist: Swelling, deformity and tenderness present. Decreased range of motion. Normal pulse.  ?   Cervical back: Normal range of motion and neck supple.  ?   Comments: Pulses present, capillary refill is intact, no tenting in the skin.  Incision is intact throughout.  Limited ROM due to pain.  ?Neurological:  ?   Mental Status: She is alert and oriented to person, place, and time.  ? ? ?ED Results / Procedures / Treatments   ?Labs ?(all labs ordered are listed, but only abnormal results are displayed) ?Labs Reviewed - No data to display ? ?EKG ?None ? ?Radiology ?DG Forearm Left ? ?Result Date: 03/18/2022 ?CLINICAL DATA:  Trauma, pain EXAM: LEFT FOREARM - 2 VIEW COMPARISON:  None Available. FINDINGS: Comminuted fracture is seen in the distal left radius. There is slight lateral and posterior displacement of distal fracture fragments. There is displaced fracture in the ulnar  styloid. IMPRESSION: Comminuted, displaced fracture is seen in the distal radius. Fracture is noted in the ulnar styloid. Electronically Signed   By: Elmer Picker M.D.   On: 03/18/2022 17:19  ? ?DG Wrist Complete Left ? ?Result Date: 03/18/2022 ?CLINICAL DATA:  Trauma, fall EXAM: LEFT WRIST - COMPLETE 3+ VIEW COMPARISON:  None Available. FINDINGS: Comminuted fracture is seen in the distal metaphysis of left radius. There is minimal posterior displacement of distal major fracture fragments. There is no definite extension of fracture line to  the articular surface. There is avulsion fracture in the ulnar styloid. Degenerative changes are noted in first carpometacarpal and first metacarpophalangeal joints. Bony spurs are also noted in the interphalangeal joint of left thumb. IMPRESSION: Comminuted fracture is seen in the distal metaphysis of left radius. Displaced fracture is seen in the ulnar styloid. Electronically Signed   By: Elmer Picker M.D.   On: 03/18/2022 17:18   ? ?Procedures ?Procedures  ? ? ?Medications Ordered in ED ?Medications  ?fentaNYL (SUBLIMAZE) injection 50 mcg (50 mcg Intravenous Given 03/18/22 1647)  ?HYDROcodone-acetaminophen (NORCO/VICODIN) 5-325 MG per tablet 1 tablet (1 tablet Oral Given 03/18/22 1834)  ?diphenhydrAMINE (BENADRYL) capsule 25 mg (25 mg Oral Given 03/18/22 1834)  ? ? ?ED Course/ Medical Decision Making/ A&P ?  ?                        ?Medical Decision Making ?Amount and/or Complexity of Data Reviewed ?Radiology: ordered. ? ?Risk ?Prescription drug management. ? ? ?Co morbidities: ?Discussed in HPI ? ? ?Brief History: ? ? ?This patient presents to the ED for concern of fall, this involves a number of treatment options, and is a complaint that carries with it a high risk of complications and morbidity.  The differential diagnosis includes left wrist fracture, hip fracture ve patient with underlying Parkinson's here status post fall landing on the left wrist.  Obvious  deformity noted, splint in place by EMS.  Given fentanyl by EMS as well.  Rsus clavicular fracture.  ? ? ?EMR reviewed including pt PMHx, past surgical history and past visits to ER.  ? ?See HPI for more details ? ? ?Imaging Stu

## 2022-03-20 DIAGNOSIS — S52502A Unspecified fracture of the lower end of left radius, initial encounter for closed fracture: Secondary | ICD-10-CM | POA: Diagnosis not present

## 2022-03-20 DIAGNOSIS — M25532 Pain in left wrist: Secondary | ICD-10-CM | POA: Diagnosis not present

## 2022-03-25 ENCOUNTER — Emergency Department (HOSPITAL_COMMUNITY): Payer: Medicare Other

## 2022-03-25 ENCOUNTER — Emergency Department (HOSPITAL_COMMUNITY)
Admission: EM | Admit: 2022-03-25 | Discharge: 2022-03-25 | Disposition: A | Discharge status: Home / Self Care | Payer: Medicare Other | Attending: Emergency Medicine | Admitting: Emergency Medicine

## 2022-03-25 ENCOUNTER — Other Ambulatory Visit: Payer: Self-pay

## 2022-03-25 ENCOUNTER — Encounter (HOSPITAL_COMMUNITY): Payer: Self-pay | Admitting: Oncology

## 2022-03-25 DIAGNOSIS — M549 Dorsalgia, unspecified: Secondary | ICD-10-CM | POA: Diagnosis not present

## 2022-03-25 DIAGNOSIS — M546 Pain in thoracic spine: Secondary | ICD-10-CM | POA: Diagnosis not present

## 2022-03-25 DIAGNOSIS — Z79899 Other long term (current) drug therapy: Secondary | ICD-10-CM | POA: Diagnosis not present

## 2022-03-25 DIAGNOSIS — M545 Low back pain, unspecified: Secondary | ICD-10-CM | POA: Diagnosis not present

## 2022-03-25 DIAGNOSIS — W19XXXA Unspecified fall, initial encounter: Secondary | ICD-10-CM | POA: Diagnosis not present

## 2022-03-25 DIAGNOSIS — I10 Essential (primary) hypertension: Secondary | ICD-10-CM

## 2022-03-25 DIAGNOSIS — G20 Parkinson's disease: Secondary | ICD-10-CM | POA: Insufficient documentation

## 2022-03-25 DIAGNOSIS — M5459 Other low back pain: Secondary | ICD-10-CM | POA: Diagnosis not present

## 2022-03-25 LAB — CBC
HCT: 40.8 % (ref 36.0–46.0)
Hemoglobin: 13.6 g/dL (ref 12.0–15.0)
MCH: 29.9 pg (ref 26.0–34.0)
MCHC: 33.3 g/dL (ref 30.0–36.0)
MCV: 89.7 fL (ref 80.0–100.0)
Platelets: 280 10*3/uL (ref 150–400)
RBC: 4.55 MIL/uL (ref 3.87–5.11)
RDW: 12.9 % (ref 11.5–15.5)
WBC: 6.2 10*3/uL (ref 4.0–10.5)
nRBC: 0 % (ref 0.0–0.2)

## 2022-03-25 LAB — BASIC METABOLIC PANEL
Anion gap: 5 (ref 5–15)
BUN: 7 mg/dL — ABNORMAL LOW (ref 8–23)
CO2: 27 mmol/L (ref 22–32)
Calcium: 8.4 mg/dL — ABNORMAL LOW (ref 8.9–10.3)
Chloride: 110 mmol/L (ref 98–111)
Creatinine, Ser: 0.73 mg/dL (ref 0.44–1.00)
GFR, Estimated: >60 mL/min (ref >60)
Glucose, Bld: 98 mg/dL (ref 70–99)
Potassium: 3.6 mmol/L (ref 3.5–5.1)
Sodium: 142 mmol/L (ref 135–145)

## 2022-03-25 MED ORDER — HYDROMORPHONE HCL 1 MG/ML IJ SOLN
0.5000 mg | Freq: Once | INTRAMUSCULAR | Status: AC
  Administered 2022-03-25: 0.5 mg via INTRAVENOUS
  Filled 2022-03-25: qty 1

## 2022-03-25 MED ORDER — ONDANSETRON HCL 4 MG/2ML IJ SOLN
4.0000 mg | Freq: Once | INTRAMUSCULAR | Status: AC | PRN
  Administered 2022-03-25: 4 mg via INTRAVENOUS
  Filled 2022-03-25: qty 2

## 2022-03-25 MED ORDER — OXYCODONE-ACETAMINOPHEN 5-325 MG PO TABS: 1.0000 | ORAL_TABLET | Freq: Four times a day (QID) | ORAL | 0 refills | Status: DC | PRN

## 2022-03-25 NOTE — Discharge Instructions (Signed)
The x-rays did not show any signs of fractures associated with your recent fall.  Take medications as needed for pain.  Continue your Flexeril.  Follow-up with your primary care doctor or orthopedic doctor for further evaluation if the symptoms persist ?

## 2022-03-25 NOTE — ED Provider Notes (Signed)
?[facility]PT ?Provider Note ? ? ?CSN: [id number] ?Arrival date & time: 03/25/22  1012 ? ?  ? ?History ? ?Chief Complaint  ?Patient presents with  ? Back Pain  ? ? ?Audrey Brown is a 76 y.o. female. ? ? ?Back Pain ?Associated symptoms: no fever   ? ?Patient presents to the ED for evaluation of back pain.  Patient does have a history of Parkinson's disease, GERD, bipolar, depression, chronic gait instability requiring wheelchair use, chronic back pain usually managed with muscle relaxants.  Patient states she had a fall about a week ago.  She came to the ED for evaluation.  Patient was diagnosed with a wrist fracture and placed in a splint.  Patient was not having any pain in her back at that time.  She states in the last couple of days she started with a worsening pain in her lower back.  Last night it became very severe.  Any minimal movement caused intense and severe pain.  This morning she had to call the ambulance.  She denies any abdominal pain.  She is not having any new numbness or weakness.  No fevers or chills. ? ?Home Medications ?Prior to Admission medications   ?Medication Sig Start Date End Date Taking? Authorizing Provider  ?albuterol (VENTOLIN HFA) 108 (90 Base) MCG/ACT inhaler Inhale 2 puffs into the lungs every 4 (four) hours as needed for wheezing or shortness of breath. 01/29/22  Yes Laurey Morale, MD  ?ALPRAZolam Duanne Moron) 0.25 MG tablet TAKE 1 TABLET THREE TIMES A DAY AS NEEDED FOR ANXIETY 01/29/22  Yes Laurey Morale, MD  ?AMBULATORY NON FORMULARY MEDICATION Take 10 mg by mouth 3 (three) times daily. Medication Name: Domperidone 10 mg 03/09/22  Yes Esterwood, Amy S, PA-C  ?buPROPion (WELLBUTRIN XL) 300 MG 24 hr tablet Take 300 mg by mouth daily.  03/27/16  Yes [provider]  ?busPIRone (BUSPAR) 10 MG tablet Take 10 mg by mouth 2 (two) times daily.    Yes [provider]  ?Calcium Carb-Cholecalciferol 500-100 MG-UNIT CHEW Chew by mouth.   Yes  [provider]  ?cyclobenzaprine (FLEXERIL) 10 MG tablet TAKE 1 TABLET THREE TIMES A DAY AS NEEDED FOR MUSCLE SPASMS ?Patient taking differently: Take 10 mg by mouth 3 (three) times daily as needed for muscle spasms. 01/30/22  Yes Laurey Morale, MD  ?esomeprazole (NEXIUM) 40 MG capsule Take 1 capsule (40 mg total) by mouth 2 (two) times daily before a meal. 03/13/21  Yes Esterwood, Amy S, PA-C  ?eszopiclone (LUNESTA) 1 MG TABS tablet Take 1 tablet (1 mg total) by mouth at bedtime as needed for sleep. Take immediately before bedtime 12/23/20  Yes Laurey Morale, MD  ?furosemide (LASIX) 20 MG tablet TAKE 1 TABLET DAILY AS NEEDED FOR FLUID ?Patient taking differently: Take 20 mg by mouth daily as needed for fluid. 09/19/21  Yes Laurey Morale, MD  ?ibuprofen (ADVIL) 800 MG tablet TAKE 1 TABLET EVERY 8 HOURS AS NEEDED FOR PAIN ?Patient taking differently: Take 800 mg by mouth every 8 (eight) hours as needed for fever or mild pain. 03/02/22  Yes Laurey Morale, MD  ?ketoconazole (NIZORAL) 2 % cream Apply topically 2 (two) times daily. 01/07/22  Yes [provider]  ?LamoTRIgine XR (LAMICTAL XR) 200 MG TB24 Take 1 tablet (200 mg total) by mouth daily. 12/15/13  Yes Laurey Morale, MD  ?losartan (COZAAR) 50 MG tablet TAKE 1 TABLET(50 MG) BY MOUTH DAILY ?Patient taking differently:  Take 50 mg by mouth daily. 09/11/21  Yes Laurey Morale, MD  ?Magnesium 500 MG CAPS Take 400 mg by mouth at bedtime.    Yes [provider]  ?Melatonin 5 MG CAPS Take 5 mg by mouth at bedtime.   Yes [provider]  ?methylPREDNISolone (MEDROL DOSEPAK) 4 MG TBPK tablet As directed 03/02/22  Yes Laurey Morale, MD  ?OSCIMIN 0.125 MG SUBL PLACE 1 TABLET UNDER THE TONGUE EVERY 6 HOURS AS NEEDED FOR CRAMPING ?Patient taking differently: Place 0.125 mg under the tongue every 6 (six) hours as needed (cramping). 02/09/17  Yes Pyrtle, Lajuan Lines, MD  ?oxybutynin (DITROPAN-XL) 10 MG 24 hr tablet Take 1 tablet (10 mg total) by mouth at  bedtime. 07/07/21  Yes Laurey Morale, MD  ?oxyCODONE-acetaminophen (PERCOCET/ROXICET) 5-325 MG tablet Take 1 tablet by mouth every 6 (six) hours as needed for severe pain. 03/25/22  Yes Dorie Rank, MD  ?promethazine (PHENERGAN) 25 MG tablet TAKE 1 TABLET EVERY 4 HOURS AS NEEDED FOR NAUSEA ?Patient taking differently: Take 25 mg by mouth every 4 (four) hours as needed for nausea. 03/16/22  Yes Laurey Morale, MD  ?propranolol (INDERAL) 10 MG tablet Take 10 mg by mouth 2 (two) times daily. 03/19/22  Yes [provider]  ?temazepam (RESTORIL) 30 MG capsule Take 30 mg by mouth at bedtime.   Yes [provider]  ?traMADol (ULTRAM) 50 MG tablet TAKE 2 TABLETS EVERY 6 HOURS AS NEEDED FOR MODERATE PAIN ?Patient taking differently: Take 100 mg by mouth every 6 (six) hours as needed for moderate pain. 03/16/22  Yes Laurey Morale, MD  ?   ? ?Allergies    ?Celebrex [celecoxib], Morphine and related, Penicillins, Risperdal [risperidone], Statins, Lisinopril, and Hydrocodone   ? ?Review of Systems   ?Review of Systems  ?Constitutional:  Negative for fever.  ?Musculoskeletal:  Positive for back pain.  ? ?Physical Exam ?Updated Vital Signs ?BP (!) 171/82   Pulse 81   Temp 98 ?F (36.7 ?C) (Oral)   Resp 19   Ht 1.575 m ('5\' 2"'$ )   Wt 81.6 kg   SpO2 91%   BMI 32.92 kg/m?  ?Physical Exam ?Vitals and nursing note reviewed.  ?Constitutional:   ?   Appearance: She is well-developed. She is not diaphoretic.  ?HENT:  ?   Head: Normocephalic and atraumatic.  ?   Right Ear: External ear normal.  ?   Left Ear: External ear normal.  ?   Mouth/Throat:  ?   Mouth: Mucous membranes are dry.  ?Eyes:  ?   General: No scleral icterus.    ?   Right eye: No discharge.     ?   Left eye: No discharge.  ?   Conjunctiva/sclera: Conjunctivae normal.  ?Neck:  ?   Trachea: No tracheal deviation.  ?Cardiovascular:  ?   Rate and Rhythm: Normal rate and regular rhythm.  ?Pulmonary:  ?   Effort: Pulmonary effort is normal. No respiratory  distress.  ?   Breath sounds: Normal breath sounds. No stridor.  ?Abdominal:  ?   General: There is no distension.  ?   Palpations: Abdomen is soft. There is no mass.  ?Musculoskeletal:     ?   General: No swelling or deformity.  ?   Cervical back: Normal and neck supple.  ?   Thoracic back: Tenderness present. Decreased range of motion.  ?   Lumbar back: Tenderness present. Decreased range of motion.  ?Skin: ?  General: Skin is warm and dry.  ?   Findings: No rash.  ?Neurological:  ?   Mental Status: She is alert.  ?   Cranial Nerves: Cranial nerve deficit: no gross deficits.  ?   Comments: Able to plantarflex and dorsiflex equally, sensation intact bilateral lower extremities  ? ? ?ED Results / Procedures / Treatments   ?Labs ?(all labs ordered are listed, but only abnormal results are displayed) ?Labs Reviewed  ?BASIC METABOLIC PANEL - Abnormal; Notable for the following components:  ?    Result Value  ? BUN 7 (*)   ? Calcium 8.4 (*)   ? All other components within normal limits  ?CBC  ? ? ?EKG ?None ? ?Radiology ?DG Thoracic Spine 2 View ? ?Result Date: 03/25/2022 ?CLINICAL DATA:  Back pain, fall 1 week earlier EXAM: THORACIC SPINE 2 VIEWS COMPARISON:  None Available. FINDINGS: No recent fracture is seen. Osteopenia is seen in bony structures. There is decrease in height of body of T11 vertebra with previous vertebroplasty. There is decrease in height of body L1 vertebra which will be better evaluated in the lumbar spine radiographs there is mild dextroscoliosis. Degenerative changes are noted with bony spurs. IMPRESSION: No recent fracture is seen in the thoracic spine. There is old compression fracture with vertebroplasty in the body of T11 vertebra. Electronically Signed   By: Elmer Picker M.D.   On: 03/25/2022 12:58  ? ?DG Lumbar Spine Complete ? ?Result Date: 03/25/2022 ?CLINICAL DATA:  Back pain EXAM: LUMBAR SPINE - COMPLETE 4+ VIEW COMPARISON:  10/04/2021 FINDINGS: There is old compression fracture  in the body of L1 vertebra with no significant interval change. No recent fracture is seen. There is minimal retrolisthesis at L2-L3 level. There is previous vertebroplasty in the body of T11 vertebra. Osteopenia is s

## 2022-03-25 NOTE — ED Triage Notes (Signed)
Pt had a mechanical fall one week ago seen here.  Pt has chronic back pain that has been exasperated since the fall. No additional falls. Per EMS pt woke this morning lower back spasm with radiation down b/l legs. Pt unable to get OOB d/t pain. Pt given 100 mcg of fentanyl, 4 mg zofran as well as took a flexeril she has rx prior to arrival.   ?

## 2022-03-27 DIAGNOSIS — S52502A Unspecified fracture of the lower end of left radius, initial encounter for closed fracture: Secondary | ICD-10-CM | POA: Diagnosis not present

## 2022-03-27 DIAGNOSIS — M25532 Pain in left wrist: Secondary | ICD-10-CM | POA: Diagnosis not present

## 2022-03-30 ENCOUNTER — Telehealth (INDEPENDENT_AMBULATORY_CARE_PROVIDER_SITE_OTHER): Payer: Medicare Other | Admitting: Family Medicine

## 2022-03-30 ENCOUNTER — Encounter: Payer: Self-pay | Admitting: Family Medicine

## 2022-03-30 DIAGNOSIS — M545 Low back pain, unspecified: Secondary | ICD-10-CM | POA: Diagnosis not present

## 2022-03-30 NOTE — Progress Notes (Signed)
   Subjective:    Patient ID: Audrey Brown, female    DOB: December 09, 1945, 76 y.o.   MRN: [id number]  HPI Virtual Visit via Telephone Note  I connected with the patient on 03/30/22 at  3:15 PM EDT by telephone and verified that I am speaking with the correct person using two identifiers.   I discussed the limitations, risks, security and privacy concerns of performing an evaluation and management service by telephone and the availability of in person appointments. I also discussed with the patient that there may be a patient responsible charge related to this service. The patient expressed understanding and agreed to proceed.  Location patient: home Location provider: work or home office Participants present for the call: patient, provider Patient did not have a visit in the prior 7 days to address this/these issue(s).   History of Present Illness: Here to follow up 2 recent ED visits. On 03-18-22 she fell and injured her left wrist. At the ED Xrays showed a comminuted fracture of the distal left radius. A splint was applied and a referral was made to Hand Surgery. She did not have back pain at that time. However in the next few days she developed sharp pains in the lower back and she returned to the ED on 03-25-22. Xrays showed old compression fractures to T11 and L1, but no acute fractures. She was given a small supply of Oxycodone to use as needed. Since then she continues to have low back pains despite using ice, Flexeril, and Ibuprofen 800 mg. She had seen Dr. Arsenio Katz at the [facility] in 2013, and he performed 2 lumbar nerve ablations. These were quite successful in relieving her pain. She asks to see him again.    Observations/Objective: Patient sounds cheerful and well on the phone. I do not appreciate any SOB. Speech and thought processing are grossly intact. Patient reported vitals:  Assessment and Plan: Low back pain after a recent fall. She is given a Medrol dose  pack, and we will refer her back to Dr. Octavio Manns. We spent a total of ( 28  ) minutes reviewing records and discussing these issues.  Alysia Penna, MD    Follow Up Instructions:     [phone] 5-10 [phone] 11-20 9443 21-30 I did not refer this patient for an OV in the next 24 hours for this/these issue(s).  I discussed the assessment and treatment plan with the patient. The patient was provided an opportunity to ask questions and all were answered. The patient agreed with the plan and demonstrated an understanding of the instructions.   The patient was advised to call back or seek an in-person evaluation if the symptoms worsen or if the condition fails to improve as anticipated.  I provided 28 minutes of non-face-to-face time during this encounter.   Alysia Penna, MD     Review of Systems     Objective:   Physical Exam        Assessment & Plan:

## 2022-04-08 ENCOUNTER — Telehealth: Payer: Self-pay | Admitting: Family Medicine

## 2022-04-08 NOTE — Telephone Encounter (Signed)
Pt was seen on 03-30-2022 and still waiting on pain medication to be sent to pharm. Pt does not know the name of medication  [place], [address] AT [address] Phone:  [phone]  Fax:  [phone]

## 2022-04-08 NOTE — Telephone Encounter (Signed)
Pt is requesting for refill on her Oxycodone, last filled at the hospital for 15 tablets Please advise

## 2022-04-09 MED ORDER — OXYCODONE-ACETAMINOPHEN 5-325 MG PO TABS
1.0000 | ORAL_TABLET | ORAL | 0 refills | Status: AC | PRN
Start: 2022-04-09 — End: 2022-04-14

## 2022-04-09 NOTE — Telephone Encounter (Signed)
Called patient to inform medication was sent to [facility].

## 2022-04-09 NOTE — Telephone Encounter (Signed)
I sent in another #30 pills

## 2022-04-16 ENCOUNTER — Telehealth: Payer: Self-pay | Admitting: Family Medicine

## 2022-04-16 NOTE — Telephone Encounter (Signed)
Attempted to reach pt.  Voicemail is full.

## 2022-04-16 NOTE — Telephone Encounter (Signed)
Pt states she is supposed to be referred to a back pain specialist.  She is requesting a call back.

## 2022-04-17 DIAGNOSIS — S52502A Unspecified fracture of the lower end of left radius, initial encounter for closed fracture: Secondary | ICD-10-CM | POA: Diagnosis not present

## 2022-04-17 DIAGNOSIS — M25532 Pain in left wrist: Secondary | ICD-10-CM | POA: Diagnosis not present

## 2022-04-17 NOTE — Telephone Encounter (Signed)
Spoke with pt aware of the neurology referral placed on 03/30/22 by Dr Sarajane Jews , pt state that she will call [facility] Neurology office to schedule appointment

## 2022-05-07 DIAGNOSIS — S52502A Unspecified fracture of the lower end of left radius, initial encounter for closed fracture: Secondary | ICD-10-CM | POA: Diagnosis not present

## 2022-05-07 DIAGNOSIS — M25532 Pain in left wrist: Secondary | ICD-10-CM | POA: Diagnosis not present

## 2022-05-20 ENCOUNTER — Encounter: Payer: Self-pay | Admitting: Family Medicine

## 2022-05-20 ENCOUNTER — Telehealth (INDEPENDENT_AMBULATORY_CARE_PROVIDER_SITE_OTHER): Payer: Medicare Other | Admitting: Family Medicine

## 2022-05-20 DIAGNOSIS — M47817 Spondylosis without myelopathy or radiculopathy, lumbosacral region: Secondary | ICD-10-CM | POA: Diagnosis not present

## 2022-05-20 MED ORDER — OXYCODONE-ACETAMINOPHEN 5-325 MG PO TABS: 1.0000 | ORAL_TABLET | ORAL | 0 refills | Status: AC | PRN

## 2022-05-20 NOTE — Progress Notes (Signed)
Subjective:    Patient ID: Audrey Brown, female    DOB: 10/01/1946, 76 y.o.   MRN: [id number]  HPI Virtual Visit via Video Note  I connected with the patient on 05/20/22 at  2:00 PM EDT by a video enabled telemedicine application and verified that I am speaking with the correct person using two identifiers.  Location patient: home Location provider:work or home office Persons participating in the virtual visit: patient, provider  I discussed the limitations of evaluation and management by telemedicine and the availability of in person appointments. The patient expressed understanding and agreed to proceed.   HPI: Here asking for more pain medication. She is seeing Dr. Arsenio Katz at [facility] for her chronic low back pain, and they are planning on doing an ablation procedure sometime next month. She has been using Tramadol for the pain, but sometimes this is no strong enough. We have periodically supplied her with Percocet for the breakthrough pain, and this has been helpful.    ROS: See pertinent positives and negatives per HPI.  Past Medical History:  Diagnosis Date   Arthritis    lower back   Bipolar disorder ([place])    Depression    GERD (gastroesophageal reflux disease)    Glaucoma    bilateral   Hiatal hernia    Loss of vision    right eye   Parkinson disease (H[facility])    Poor short-term memory    Sagittal band rupture at metacarpophalangeal joint 11/2016   right long finger    Past Surgical History:  Procedure Laterality Date   ANKLE SURGERY     BALLOON DILATION N/A 10/29/2017   Procedure: BALLOON DILATION;  Surgeon: Jerene Bears, MD;  Location: [facility];  Service: Gastroenterology;  Laterality: N/A;   CATARACT EXTRACTION Right 08/07/2020   CERVICAL DISC SURGERY  1994   CESAREAN SECTION     CHOLECYSTECTOMY     CYST REMOVAL NECK     ESOPHAGEAL DILATION  12/19/2018   Procedure: ESOPHAGEAL DILATION;  Surgeon: Jerene Bears, MD;  Location: [facility];   Service: Gastroenterology;;   ESOPHAGOGASTRODUODENOSCOPY (EGD) WITH ESOPHAGEAL DILATION  07/27/2012   with Propofol   ESOPHAGOGASTRODUODENOSCOPY (EGD) WITH PROPOFOL N/A 06/23/2016   Procedure: ESOPHAGOGASTRODUODENOSCOPY (EGD) WITH PROPOFOL;  Surgeon: Jerene Bears, MD;  Location: [facility];  Service: Gastroenterology;  Laterality: N/A;   ESOPHAGOGASTRODUODENOSCOPY (EGD) WITH PROPOFOL N/A 10/29/2017   Procedure: ESOPHAGOGASTRODUODENOSCOPY (EGD) WITH PROPOFOL;  Surgeon: Jerene Bears, MD;  Location: [facility];  Service: Gastroenterology;  Laterality: N/A;   ESOPHAGOGASTRODUODENOSCOPY (EGD) WITH PROPOFOL N/A 12/19/2018   Procedure: ESOPHAGOGASTRODUODENOSCOPY (EGD) WITH PROPOFOL;  Surgeon: Jerene Bears, MD;  Location: [facility];  Service: Gastroenterology;  Laterality: N/A;   EYE SURGERY     EYE SURGERY  09/2019   R eye procedure to relieve pressure   EYE SURGERY     FOOT NEUROMA SURGERY Left    ORIF DISTAL RADIUS FRACTURE Right 07/04/2010   SAVORY DILATION N/A 06/23/2016   Procedure: SAVORY DILATION;  Surgeon: Jerene Bears, MD;  Location: [facility];  Service: Gastroenterology;  Laterality: N/A;   TENDON REPAIR Right 12/01/2016   Procedure: right long finger ulnar sagittal band reconstruction;  Surgeon: Leanora Cover, MD;  Location: [facility];  Service: Orthopedics;  Laterality: Right;   TONSILLECTOMY      Family History  Problem Relation Age of Onset   Emphysema Mother    Heart attack Father    Hypertension Other  Lung cancer Other    Colon cancer Neg Hx    Esophageal cancer Neg Hx    Rectal cancer Neg Hx    Stomach cancer Neg Hx      Current Outpatient Medications:    albuterol (VENTOLIN HFA) 108 (90 Base) MCG/ACT inhaler, Inhale 2 puffs into the lungs every 4 (four) hours as needed for wheezing or shortness of breath., Disp: 18 g, Rfl: 3   ALPRAZolam (XANAX) 0.25 MG tablet, TAKE 1 TABLET THREE TIMES A DAY AS NEEDED FOR ANXIETY, Disp: 90 tablet, Rfl: 2    AMBULATORY NON FORMULARY MEDICATION, Take 10 mg by mouth 3 (three) times daily. Medication Name: Domperidone 10 mg, Disp: 360 tablet, Rfl: 0   buPROPion (WELLBUTRIN XL) 300 MG 24 hr tablet, Take 300 mg by mouth daily. , Disp: , Rfl:    busPIRone (BUSPAR) 10 MG tablet, Take 10 mg by mouth 2 (two) times daily. , Disp: , Rfl:    Calcium Carb-Cholecalciferol 500-100 MG-UNIT CHEW, Chew by mouth., Disp: , Rfl:    cyclobenzaprine (FLEXERIL) 10 MG tablet, TAKE 1 TABLET THREE TIMES A DAY AS NEEDED FOR MUSCLE SPASMS (Patient taking differently: Take 10 mg by mouth 3 (three) times daily as needed for muscle spasms.), Disp: 270 tablet, Rfl: 0   esomeprazole (NEXIUM) 40 MG capsule, Take 1 capsule (40 mg total) by mouth 2 (two) times daily before a meal., Disp: 180 capsule, Rfl: 4   eszopiclone (LUNESTA) 1 MG TABS tablet, Take 1 tablet (1 mg total) by mouth at bedtime as needed for sleep. Take immediately before bedtime, Disp: 90 tablet, Rfl: 1   furosemide (LASIX) 20 MG tablet, TAKE 1 TABLET DAILY AS NEEDED FOR FLUID (Patient taking differently: Take 20 mg by mouth daily as needed for fluid.), Disp: 90 tablet, Rfl: 3   ibuprofen (ADVIL) 800 MG tablet, TAKE 1 TABLET EVERY 8 HOURS AS NEEDED FOR PAIN (Patient taking differently: Take 800 mg by mouth every 8 (eight) hours as needed for fever or mild pain.), Disp: 270 tablet, Rfl: 3   ketoconazole (NIZORAL) 2 % cream, Apply topically 2 (two) times daily., Disp: , Rfl:    LamoTRIgine XR (LAMICTAL XR) 200 MG TB24, Take 1 tablet (200 mg total) by mouth daily., Disp: 90 tablet, Rfl: 1   losartan (COZAAR) 50 MG tablet, TAKE 1 TABLET(50 MG) BY MOUTH DAILY (Patient taking differently: Take 50 mg by mouth daily.), Disp: 30 tablet, Rfl: 1   Magnesium 500 MG CAPS, Take 400 mg by mouth at bedtime. , Disp: , Rfl:    Melatonin 5 MG CAPS, Take 5 mg by mouth at bedtime., Disp: , Rfl:    methylPREDNISolone (MEDROL DOSEPAK) 4 MG TBPK tablet, As directed, Disp: 21 tablet, Rfl: 0    OSCIMIN 0.125 MG SUBL, PLACE 1 TABLET UNDER THE TONGUE EVERY 6 HOURS AS NEEDED FOR CRAMPING (Patient taking differently: Place 0.125 mg under the tongue every 6 (six) hours as needed (cramping).), Disp: 360 each, Rfl: 0   oxybutynin (DITROPAN-XL) 10 MG 24 hr tablet, Take 1 tablet (10 mg total) by mouth at bedtime., Disp: 90 tablet, Rfl: 3   promethazine (PHENERGAN) 25 MG tablet, TAKE 1 TABLET EVERY 4 HOURS AS NEEDED FOR NAUSEA (Patient taking differently: Take 25 mg by mouth every 4 (four) hours as needed for nausea.), Disp: 180 tablet, Rfl: 11   propranolol (INDERAL) 10 MG tablet, Take 10 mg by mouth 2 (two) times daily., Disp: , Rfl:    temazepam (RESTORIL) 30 MG capsule,  Take 30 mg by mouth at bedtime., Disp: , Rfl:    traMADol (ULTRAM) 50 MG tablet, TAKE 2 TABLETS EVERY 6 HOURS AS NEEDED FOR MODERATE PAIN (Patient taking differently: Take 100 mg by mouth every 6 (six) hours as needed for moderate pain.), Disp: 360 tablet, Rfl: 1  EXAM:  VITALS per patient if applicable:  GENERAL: alert, oriented, appears well and in no acute distress  HEENT: atraumatic, conjunttiva clear, no obvious abnormalities on inspection of external nose and ears  NECK: normal movements of the head and neck  LUNGS: on inspection no signs of respiratory distress, breathing rate appears normal, no obvious gross SOB, gasping or wheezing  CV: no obvious cyanosis  MS: moves all visible extremities without noticeable abnormality  PSYCH/NEURO: pleasant and cooperative, no obvious depression or anxiety, speech and thought processing grossly intact  ASSESSMENT AND PLAN: Low back pain. We will send in another #30 of the Percocet. Hopefully she can get this ablation done next month. Alysia Penna, MD  Discussed the following assessment and plan:  No diagnosis found.     I discussed the assessment and treatment plan with the patient. The patient was provided an opportunity to ask questions and all were answered. The  patient agreed with the plan and demonstrated an understanding of the instructions.   The patient was advised to call back or seek an in-person evaluation if the symptoms worsen or if the condition fails to improve as anticipated.      Review of Systems     Objective:   Physical Exam        Assessment & Plan:

## 2022-05-21 ENCOUNTER — Telehealth: Payer: Self-pay | Admitting: Family Medicine

## 2022-05-21 ENCOUNTER — Other Ambulatory Visit: Payer: Self-pay

## 2022-05-21 DIAGNOSIS — M47817 Spondylosis without myelopathy or radiculopathy, lumbosacral region: Secondary | ICD-10-CM

## 2022-05-21 MED ORDER — CYCLOBENZAPRINE HCL 10 MG PO TABS: 10.0000 mg | ORAL_TABLET | Freq: Three times a day (TID) | ORAL | 0 refills | Status: DC | PRN

## 2022-05-21 NOTE — Telephone Encounter (Signed)
Refill sent to [facility].  

## 2022-05-21 NOTE — Telephone Encounter (Signed)
Pt requesting refill cyclobenzaprine (FLEXERIL) 10 MG tablet  [facility] [place], [place] Phone:  [phone]  Fax:  [phone]

## 2022-05-21 NOTE — Telephone Encounter (Signed)
[id number] form to be filled out--placed in dr's folder.  Please call [phone] upon completion.

## 2022-05-25 NOTE — Telephone Encounter (Signed)
Pt form was received and placed in Dr Sarajane Jews red folder to be complted

## 2022-05-27 NOTE — Telephone Encounter (Signed)
Spoke with pt advised that disability form is ready for pick up, pt state that she will send her daughter to pick up today. Form placed at the front office filing cabinet and a copy was sent to scanning

## 2022-06-12 ENCOUNTER — Other Ambulatory Visit: Payer: Self-pay | Admitting: Physician Assistant

## 2022-06-12 ENCOUNTER — Other Ambulatory Visit: Payer: Self-pay | Admitting: Internal Medicine

## 2022-06-12 ENCOUNTER — Other Ambulatory Visit: Payer: Self-pay | Admitting: Family Medicine

## 2022-06-12 NOTE — Telephone Encounter (Signed)
Last refill- 03/16/22-360 tabs, 1 refill Last VV- 05/20/22  No future OV scheduled.

## 2022-06-15 ENCOUNTER — Other Ambulatory Visit: Payer: Self-pay | Admitting: Family Medicine

## 2022-06-18 DIAGNOSIS — S52502A Unspecified fracture of the lower end of left radius, initial encounter for closed fracture: Secondary | ICD-10-CM | POA: Diagnosis not present

## 2022-06-18 DIAGNOSIS — M25532 Pain in left wrist: Secondary | ICD-10-CM | POA: Diagnosis not present

## 2022-07-06 DIAGNOSIS — S22080D Wedge compression fracture of T11-T12 vertebra, subsequent encounter for fracture with routine healing: Secondary | ICD-10-CM | POA: Diagnosis not present

## 2022-07-06 DIAGNOSIS — M5416 Radiculopathy, lumbar region: Secondary | ICD-10-CM | POA: Diagnosis not present

## 2022-07-06 DIAGNOSIS — M545 Low back pain, unspecified: Secondary | ICD-10-CM | POA: Diagnosis not present

## 2022-07-06 DIAGNOSIS — M4726 Other spondylosis with radiculopathy, lumbar region: Secondary | ICD-10-CM | POA: Diagnosis not present

## 2022-07-06 DIAGNOSIS — M4316 Spondylolisthesis, lumbar region: Secondary | ICD-10-CM | POA: Diagnosis not present

## 2022-07-06 DIAGNOSIS — Z9889 Other specified postprocedural states: Secondary | ICD-10-CM | POA: Diagnosis not present

## 2022-07-06 DIAGNOSIS — M40295 Other kyphosis, thoracolumbar region: Secondary | ICD-10-CM | POA: Diagnosis not present

## 2022-07-06 DIAGNOSIS — S32018S Other fracture of first lumbar vertebra, sequela: Secondary | ICD-10-CM | POA: Diagnosis not present

## 2022-07-06 DIAGNOSIS — M5116 Intervertebral disc disorders with radiculopathy, lumbar region: Secondary | ICD-10-CM | POA: Diagnosis not present

## 2022-07-06 DIAGNOSIS — S22080A Wedge compression fracture of T11-T12 vertebra, initial encounter for closed fracture: Secondary | ICD-10-CM | POA: Diagnosis not present

## 2022-07-08 DIAGNOSIS — S22080A Wedge compression fracture of T11-T12 vertebra, initial encounter for closed fracture: Secondary | ICD-10-CM | POA: Diagnosis not present

## 2022-07-09 DIAGNOSIS — S22080A Wedge compression fracture of T11-T12 vertebra, initial encounter for closed fracture: Secondary | ICD-10-CM | POA: Diagnosis not present

## 2022-07-09 DIAGNOSIS — M810 Age-related osteoporosis without current pathological fracture: Secondary | ICD-10-CM | POA: Diagnosis not present

## 2022-07-10 DIAGNOSIS — S22080A Wedge compression fracture of T11-T12 vertebra, initial encounter for closed fracture: Secondary | ICD-10-CM | POA: Diagnosis not present

## 2022-07-14 ENCOUNTER — Telehealth: Payer: Self-pay

## 2022-07-14 NOTE — Telephone Encounter (Signed)
I have left her daughter Langley Gauss a detailed message for her to call me back so I can get more information and set up an appointment for Mom .

## 2022-07-14 NOTE — Telephone Encounter (Signed)
OK to refill up to 3 months (would check with daughter - patient is demented) if she is still taking and it is helpful  Is going to need a follow-up APP or Dr. Hilarie Fredrickson for more refills

## 2022-07-14 NOTE — Telephone Encounter (Signed)
As Dr of the day this AM will route this message to Dr Carlean Purl as Dr Hilarie Fredrickson is away this week. Candrug requesting a refill on his domperidone '10mg'$ , last seen 03/2021. Please advise Sir. Thank you.

## 2022-07-16 ENCOUNTER — Ambulatory Visit: Payer: Medicare Other | Admitting: Family Medicine

## 2022-07-16 ENCOUNTER — Telehealth: Payer: Self-pay | Admitting: Physician Assistant

## 2022-07-16 MED ORDER — AMBULATORY NON FORMULARY MEDICATION: 10.0000 mg | Freq: Three times a day (TID) | 0 refills | Status: DC

## 2022-07-16 NOTE — Telephone Encounter (Signed)
See telephone note started on 9/5 by Pattie Martinique

## 2022-07-16 NOTE — Telephone Encounter (Signed)
Patient returned call from Patti Martinique to discuss refills. She has scheduled a follow up appointment with Nicoletta Ba for 10/4. I advised her that per Dr. Carlean Purl we could give her 3 months worth. I have advised that we would get that sent to [facility]

## 2022-07-16 NOTE — Telephone Encounter (Signed)
Patient called, states she would like to speak to you regarding her medication. Please call to advise.

## 2022-07-20 ENCOUNTER — Encounter: Payer: Self-pay | Admitting: Family Medicine

## 2022-07-20 ENCOUNTER — Ambulatory Visit (INDEPENDENT_AMBULATORY_CARE_PROVIDER_SITE_OTHER): Payer: Medicare Other | Admitting: Family Medicine

## 2022-07-20 VITALS — BP 130/80 | HR 64 | Temp 97.8°F | Wt 180.0 lb

## 2022-07-20 DIAGNOSIS — Z23 Encounter for immunization: Secondary | ICD-10-CM

## 2022-07-20 DIAGNOSIS — M810 Age-related osteoporosis without current pathological fracture: Secondary | ICD-10-CM | POA: Insufficient documentation

## 2022-07-20 DIAGNOSIS — S22080G Wedge compression fracture of T11-T12 vertebra, subsequent encounter for fracture with delayed healing: Secondary | ICD-10-CM

## 2022-07-20 DIAGNOSIS — M8000XG Age-related osteoporosis with current pathological fracture, unspecified site, subsequent encounter for fracture with delayed healing: Secondary | ICD-10-CM

## 2022-07-20 MED ORDER — OXYCODONE-ACETAMINOPHEN 10-325 MG PO TABS: 1.0000 | ORAL_TABLET | ORAL | 0 refills | Status: AC | PRN

## 2022-07-20 MED ORDER — TIZANIDINE HCL 4 MG PO TABS
4.0000 mg | ORAL_TABLET | Freq: Four times a day (QID) | ORAL | 5 refills | Status: DC | PRN
Start: 2022-07-20 — End: 2022-08-17

## 2022-07-20 NOTE — Addendum Note (Signed)
Addended by: Wyvonne Lenz on: 07/20/2022 02:31 PM   Modules accepted: Orders

## 2022-07-20 NOTE — Progress Notes (Signed)
   Subjective:    Patient ID: Audrey Brown, female    DOB: March 03, 1946, 76 y.o.   MRN: [id number]  HPI Here to follow up after a compression fracture to T12 which was the result of a fall on 03-18-22. She is seeing Lyn Henri NP at [facility]. This discovered too long after the injury for her to qualify for kyphoplasty. She is wearing a brace for now, and the plan is for her to have PT sometime soon. They are prescribing her Tramadol for the pain, but she asks [place] for a small supply of Percocet to keep for days when the pain is more severe. She was also diagnosed with osteoporosis, and they are deciding what would be the best form of treatment for this.    Review of Systems  Constitutional: Negative.   Respiratory: Negative.    Cardiovascular: Negative.   Musculoskeletal:  Positive for back pain.       Objective:   Physical Exam Constitutional:      Appearance: Normal appearance.     Comments: In her scooter, wearing the back brace   Cardiovascular:     Rate and Rhythm: Normal rate and regular rhythm.     Pulses: Normal pulses.     Heart sounds: Normal heart sounds.  Pulmonary:     Effort: Pulmonary effort is normal.     Breath sounds: Normal breath sounds.  Neurological:     Mental Status: She is alert.           Assessment & Plan:  T12 compression fracture. We will provide her with a small supple of Percocet to use as needed, in addition to her Tramadol. She will follow up with orthopedics for this and for the osteoporosis.  Alysia Penna, MD

## 2022-08-10 DIAGNOSIS — S22080A Wedge compression fracture of T11-T12 vertebra, initial encounter for closed fracture: Secondary | ICD-10-CM | POA: Diagnosis not present

## 2022-08-10 DIAGNOSIS — S22080D Wedge compression fracture of T11-T12 vertebra, subsequent encounter for fracture with routine healing: Secondary | ICD-10-CM | POA: Diagnosis not present

## 2022-08-10 DIAGNOSIS — Z79899 Other long term (current) drug therapy: Secondary | ICD-10-CM | POA: Diagnosis not present

## 2022-08-10 DIAGNOSIS — S22088D Other fracture of T11-T12 vertebra, subsequent encounter for fracture with routine healing: Secondary | ICD-10-CM | POA: Diagnosis not present

## 2022-08-10 DIAGNOSIS — M810 Age-related osteoporosis without current pathological fracture: Secondary | ICD-10-CM | POA: Diagnosis not present

## 2022-08-10 DIAGNOSIS — M5134 Other intervertebral disc degeneration, thoracic region: Secondary | ICD-10-CM | POA: Diagnosis not present

## 2022-08-12 ENCOUNTER — Encounter: Payer: Self-pay | Admitting: Physician Assistant

## 2022-08-12 ENCOUNTER — Ambulatory Visit (INDEPENDENT_AMBULATORY_CARE_PROVIDER_SITE_OTHER): Payer: Medicare Other | Admitting: Physician Assistant

## 2022-08-12 VITALS — BP 130/80 | HR 72 | Ht 62.0 in | Wt 180.0 lb

## 2022-08-12 DIAGNOSIS — R1310 Dysphagia, unspecified: Secondary | ICD-10-CM

## 2022-08-12 DIAGNOSIS — L820 Inflamed seborrheic keratosis: Secondary | ICD-10-CM | POA: Diagnosis not present

## 2022-08-12 DIAGNOSIS — K219 Gastro-esophageal reflux disease without esophagitis: Secondary | ICD-10-CM | POA: Diagnosis not present

## 2022-08-12 DIAGNOSIS — K3184 Gastroparesis: Secondary | ICD-10-CM | POA: Diagnosis not present

## 2022-08-12 DIAGNOSIS — D1801 Hemangioma of skin and subcutaneous tissue: Secondary | ICD-10-CM | POA: Diagnosis not present

## 2022-08-12 DIAGNOSIS — L538 Other specified erythematous conditions: Secondary | ICD-10-CM | POA: Diagnosis not present

## 2022-08-12 DIAGNOSIS — R208 Other disturbances of skin sensation: Secondary | ICD-10-CM | POA: Diagnosis not present

## 2022-08-12 DIAGNOSIS — L918 Other hypertrophic disorders of the skin: Secondary | ICD-10-CM | POA: Diagnosis not present

## 2022-08-12 MED ORDER — AMBULATORY NON FORMULARY MEDICATION: 10.0000 mg | Freq: Three times a day (TID) | 3 refills | Status: DC

## 2022-08-12 MED ORDER — ESOMEPRAZOLE MAGNESIUM 40 MG PO CPDR: 40.0000 mg | DELAYED_RELEASE_CAPSULE | Freq: Two times a day (BID) | ORAL | 3 refills | Status: DC

## 2022-08-12 NOTE — Progress Notes (Signed)
Subjective:    Patient ID: Audrey Brown, female    DOB: [birth year], 76 y.o.   MRN: [id number]  HPI  Audrey Brown is a pleasant 76 year old white female established with Dr. Rhea Belton.  She is followed for chronic GERD and gastroparesis and was last seen here in May 2022 by myself.  She comes back in today for routine follow-up and med refills. She has history of Parkinson's disease with mild dementia, hypertension, bipolar disorder, and has been suffering from a lumbar compression fracture over the past 4 months. She last had EGD in June 2021 for complaints of dysphagia and was found to have a 2 cm hiatal hernia, no stricture, she was empirically dilated to 18 mm at the GE junction.  Per the prior notes she did have improvement in her symptoms for a few months after the dilation.  I last saw her in May 2022 her symptoms had recurred.  She then underwent barium swallow which showed a small hiatal hernia and no evidence of stricture there was mild dysmotility.  She has been on domperidone 10 mg 3 times daily which works well for gastro paretic symptoms, no current early satiety nausea or vomiting. Her caregiver her accompanies her today says that she does well from a reflux standpoint as long as she does not miss any doses of the Nexium.  If she misses the Nexium she will have increase in coughing.  She denies any problems with heartburn or indigestion, no abdominal pain, bowel movements have been normal. She has developed some recurrent dysphagia primarily to breads or dry foods and they have been trying to pay attention to moistening foods and sipping liquids with oral intake.  Patient remembers that the dilation did help her for a while last time. She is currently requiring chronic pain medication for the compression fracture, and also sustained a fractured wrist with that fall.  Last colonoscopy 2018 was negative.  Review of Systems Pertinent positive and negative review of systems were noted in the  above HPI section.  All other review of systems was otherwise negative.   Outpatient Encounter Medications as of 08/12/2022  Medication Sig   albuterol (VENTOLIN HFA) 108 (90 Base) MCG/ACT inhaler Inhale 2 puffs into the lungs every 4 (four) hours as needed for wheezing or shortness of breath.   ALPRAZolam (XANAX) 0.25 MG tablet TAKE 1 TABLET THREE TIMES A DAY AS NEEDED FOR ANXIETY   buPROPion (WELLBUTRIN XL) 300 MG 24 hr tablet Take 300 mg by mouth daily.    busPIRone (BUSPAR) 10 MG tablet Take 10 mg by mouth 2 (two) times daily.    Calcium Carb-Cholecalciferol 500-100 MG-UNIT CHEW Chew by mouth.   ibuprofen (ADVIL) 800 MG tablet TAKE 1 TABLET EVERY 8 HOURS AS NEEDED FOR PAIN (Patient taking differently: Take 800 mg by mouth every 8 (eight) hours as needed for fever or mild pain.)   LamoTRIgine XR (LAMICTAL XR) 200 MG TB24 Take 1 tablet (200 mg total) by mouth daily.   losartan (COZAAR) 50 MG tablet TAKE 1 TABLET(50 MG) BY MOUTH DAILY (Patient taking differently: Take 50 mg by mouth daily.)   Magnesium 500 MG CAPS Take 400 mg by mouth at bedtime.    Melatonin 5 MG CAPS Take 5 mg by mouth at bedtime.   OSCIMIN 0.125 MG SUBL PLACE 1 TABLET UNDER THE TONGUE EVERY 6 HOURS AS NEEDED FOR CRAMPING (Patient taking differently: Place 0.125 mg under the tongue every 6 (six) hours as needed (cramping).)   oxybutynin (  DITROPAN-XL) 10 MG 24 hr tablet TAKE 1 TABLET AT BEDTIME   promethazine (PHENERGAN) 25 MG tablet TAKE 1 TABLET EVERY 4 HOURS AS NEEDED FOR NAUSEA (Patient taking differently: Take 25 mg by mouth every 4 (four) hours as needed for nausea.)   propranolol (INDERAL) 10 MG tablet Take 10 mg by mouth 2 (two) times daily.   temazepam (RESTORIL) 30 MG capsule Take 30 mg by mouth at bedtime.   tiZANidine (ZANAFLEX) 4 MG tablet Take 1 tablet (4 mg total) by mouth every 6 (six) hours as needed for muscle spasms.   traMADol (ULTRAM) 50 MG tablet TAKE 2 TABLETS EVERY 6 HOURS AS NEEDED FOR MODERATE PAIN    [DISCONTINUED] AMBULATORY NON FORMULARY MEDICATION Take 10 mg by mouth 3 (three) times daily. Medication Name: Domperidone 10 mg   [DISCONTINUED] esomeprazole (NEXIUM) 40 MG capsule Take 1 capsule (40 mg total) by mouth 2 (two) times daily before a meal. **PLEASE CALL OFFICE TO SCHEDULE FOLLOW UP   [DISCONTINUED] eszopiclone (LUNESTA) 1 MG TABS tablet Take 1 tablet (1 mg total) by mouth at bedtime as needed for sleep. Take immediately before bedtime   [DISCONTINUED] furosemide (LASIX) 20 MG tablet TAKE 1 TABLET DAILY AS NEEDED FOR FLUID (Patient taking differently: Take 20 mg by mouth daily as needed for fluid.)   [DISCONTINUED] ketoconazole (NIZORAL) 2 % cream Apply topically 2 (two) times daily.   AMBULATORY NON FORMULARY MEDICATION Take 10 mg by mouth 3 (three) times daily. Medication Name: Domperidone 10 mg   esomeprazole (NEXIUM) 40 MG capsule Take 1 capsule (40 mg total) by mouth 2 (two) times daily before a meal.   [DISCONTINUED] AMBULATORY NON FORMULARY MEDICATION Take 10 mg by mouth 3 (three) times daily. Medication Name: Domperidone 10 mg   [DISCONTINUED] methylPREDNISolone (MEDROL DOSEPAK) 4 MG TBPK tablet As directed   No facility-administered encounter medications on file as of 08/12/2022.   Allergies  Allergen Reactions   Celebrex [Celecoxib] Hives   Morphine And Related Hives   Penicillins Hives, Nausea Only and Other (See Comments)    Has patient had a PCN reaction causing immediate rash, facial/tongue/throat swelling, SOB or lightheadedness with hypotension: Yes Has patient had a PCN reaction causing severe rash involving mucus membranes or skin necrosis: Yes Has patient had a PCN reaction that required hospitalization: Yes, was already in the hospital  Has patient had a PCN reaction occurring within the last 10 years: No If all of the above answers are "NO", then may proceed with Cephalosporin use.    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) 04/10/2014   Abnormality of gait 04/10/2014   Lumbosacral spondylosis without myelopathy 04/10/2014   Dysphagia 06/16/2011   GERD with stricture 06/16/2011   Bipolar 1 disorder, mixed (HCC) 06/16/2011   Parkinsonian syndrome 06/16/2011   PARKINSONISM 11/13/2010   RESTING TREMOR 07/31/2010   DEPENDENT EDEMA 06/25/2009   Depression 01/02/2008  Social History   Socioeconomic History   Marital status: Widowed    Spouse name: Not on file   Number of children: 4   Years of education: 6 years college   Highest education level: Master's degree (e.g., MA, MS, MEng, MEd, MSW, MBA)  Occupational History   Occupation: Retired    Associate Professor: RETIRED    Comment: [employer]  Tobacco Use   Smoking status: Never   Smokeless tobacco: Never  Vaping Use   Vaping Use: Never used  Substance and Sexual Activity   Alcohol use: No    Alcohol/week: 0.0 standard drinks of alcohol   Drug use: No   Sexual activity: Not Currently  Other Topics Concern   Not on file  Social History Narrative   Daughter is her POA and must come to all visits in GI and [facility]       Patient is right-handed. She lives with her daughter and granddaughter in a split level home with a chair lift. Uses a power chair. She drinks 3-4 cups of coffee a day.      Has 4 daughters, 3 grand children, and 5 great-grand children.   Social Determinants of Health   Financial Resource Strain: Low Risk   (02/23/2022)   Overall Financial Resource Strain (CARDIA)    Difficulty of Paying Living Expenses: Not very hard  Food Insecurity: Food Insecurity Present (02/23/2022)   Hunger Vital Sign    Worried About Running Out of Food in the Last Year: Sometimes true    Ran Out of Food in the Last Year: Sometimes true  Transportation Needs: No Transportation Needs (02/23/2022)   PRAPARE - Administrator, Civil Service (Medical): No    Lack of Transportation (Non-Medical): No  Physical Activity: Inactive (02/23/2022)   Exercise Vital Sign    Days of Exercise per Week: 0 days    Minutes of Exercise per Session: 0 min  Stress: Stress Concern Present (02/23/2022)   [employer] of Occupational Health - Occupational Stress Questionnaire    Feeling of Stress : To some extent  Social Connections: Moderately Integrated (02/23/2022)   Social Connection and Isolation Panel [NHANES]    Frequency of Communication with Friends and Family: More than three times a week    Frequency of Social Gatherings with Friends and Family: Three times a week    Attends Religious Services: 1 to 4 times per year    Active Member of Clubs or Organizations: Yes    Attends Banker Meetings: More than 4 times per year    Marital Status: Widowed  Intimate Partner Violence: Not At Risk (11/14/2021)   Humiliation, Afraid, Rape, and Kick questionnaire    Fear of Current or Ex-Partner: No    Emotionally Abused: No    Physically Abused: No    Sexually Abused: No    Ms. Jenkin's family history includes Emphysema in her mother; Heart attack in her father; Hypertension in an other family member; Lung cancer in an other family member.      Objective:    Vitals:   08/12/22 1330  BP: 130/80  Pulse: 72    Physical Exam Well-developed well-nourished eld WF  in no acute distress., in wheelchair, accompanied by Caregiver   Height, Weight,180  BMI32.9  HEENT; nontraumatic normocephalic, EOMI, PE R LA,  sclera anicteric. Oropharynx;not examined Neck; supple, no JVD Cardiovascular; regular rate and rhythm with S1-S2, no murmur rub or gallop Pulmonary; Clear bilaterally Abdomen; soft, nontender, nondistended, no palpable mass  or hepatosplenomegaly, bowel sounds are active Rectal;not done Skin; benign exam, no jaundice rash or appreciable lesions Extremities; no clubbing cyanosis or edema skin warm and dry Neuro/Psych; alert and oriented x4, grossly nonfocal mood and affect appropriate , speech somewhat slow       Assessment & Plan:   #16  76 year old white female with Parkinson's disease and mild dementia with chronic GERD, previously had had significant nocturnal symptoms.  Currently well controlled on twice daily Nexium AC  #2 Mild solid food dysphagia-this may be more related to dysmotility, barium swallow 1 year ago did not show any evidence of stricture and EGD in 2021 done for dysphagia also did not show any stricture but she did have empiric dilation which helped her symptoms temporarily.  #3 gastroparesis-well-controlled on domperidone 10 mg 3 times daily #4 colon cancer surveillance-up-to-date with negative colonoscopy 2018 #5 lumbar compression fracture subacute  Plan; refill Nexium 40 mg p.o. twice daily AC x1 year Refill domperidone 10 mg p.o. 3 times daily AC x1 year She does not wish to undergo EGD at this time, we discussed the fact that this only helped her for short period of time after the last dilation anyway.  They are content with monitoring her symptoms and should she have any increase in the dysphagia symptoms with increased severity or frequency they will call for advice and we can consider repeat EGD with dilation at that time. Follow-up with Dr. Rhea Belton or myself in 1 year or sooner as needed  Nashea Chumney Oswald Hillock PA-C 08/12/2022   Cc: [name], MD

## 2022-08-12 NOTE — Patient Instructions (Addendum)
_______________________________________________________  If you are age 74 or older, your body mass index should be between 23-30. Your Body mass index is 32.92 kg/m. If this is out of the aforementioned range listed, please consider follow up with your Primary Care Provider.  If you are age 28 or younger, your body mass index should be between 19-25. Your Body mass index is 32.92 kg/m. If this is out of the aformentioned range listed, please consider follow up with your Primary Care Provider.   ________________________________________________________  The [facility] providers would like to encourage you to use [facility] to communicate with providers for non-urgent requests or questions.  Due to long hold times on the telephone, sending your provider a message by [facility] may be a faster and more efficient way to get a response.  Please allow 48 business hours for a response.  Please remember that this is for non-urgent requests.  _______________________________________________________  We have sent the following medications to your pharmacy for you to pick up at your convenience:  Nexium, Domperidone  Call for worsening dysphagia.  Follow up in 12-15 months - sooner if necessary

## 2022-08-13 NOTE — Progress Notes (Signed)
Addendum: Reviewed and agree with assessment and management plan. Emani Taussig M, MD  

## 2022-08-17 ENCOUNTER — Encounter: Payer: Self-pay | Admitting: Family Medicine

## 2022-08-17 ENCOUNTER — Ambulatory Visit: Payer: Medicare Other | Admitting: Physician Assistant

## 2022-08-17 MED ORDER — TIZANIDINE HCL 4 MG PO TABS: 4.0000 mg | ORAL_TABLET | Freq: Four times a day (QID) | ORAL | 3 refills | Status: DC | PRN

## 2022-08-17 NOTE — Telephone Encounter (Signed)
I sent in the Tizanidine RX. She should go ahead and take the medications together, but she should monitor her BP closely . Take it mornings and evenings and let me know if it gets too low

## 2022-08-18 ENCOUNTER — Other Ambulatory Visit: Payer: Self-pay | Admitting: Family Medicine

## 2022-08-18 DIAGNOSIS — I10 Essential (primary) hypertension: Secondary | ICD-10-CM

## 2022-09-14 ENCOUNTER — Other Ambulatory Visit: Payer: Self-pay | Admitting: Family Medicine

## 2022-09-21 DIAGNOSIS — M47814 Spondylosis without myelopathy or radiculopathy, thoracic region: Secondary | ICD-10-CM | POA: Diagnosis not present

## 2022-09-21 DIAGNOSIS — Z9889 Other specified postprocedural states: Secondary | ICD-10-CM | POA: Diagnosis not present

## 2022-09-21 DIAGNOSIS — M4316 Spondylolisthesis, lumbar region: Secondary | ICD-10-CM | POA: Diagnosis not present

## 2022-09-21 DIAGNOSIS — S22080A Wedge compression fracture of T11-T12 vertebra, initial encounter for closed fracture: Secondary | ICD-10-CM | POA: Diagnosis not present

## 2022-09-21 DIAGNOSIS — S22088D Other fracture of T11-T12 vertebra, subsequent encounter for fracture with routine healing: Secondary | ICD-10-CM | POA: Diagnosis not present

## 2022-10-07 DIAGNOSIS — S22080A Wedge compression fracture of T11-T12 vertebra, initial encounter for closed fracture: Secondary | ICD-10-CM | POA: Diagnosis not present

## 2022-10-21 ENCOUNTER — Other Ambulatory Visit: Payer: Self-pay | Admitting: Family Medicine

## 2022-10-21 DIAGNOSIS — Z1231 Encounter for screening mammogram for malignant neoplasm of breast: Secondary | ICD-10-CM

## 2022-10-23 ENCOUNTER — Ambulatory Visit (INDEPENDENT_AMBULATORY_CARE_PROVIDER_SITE_OTHER): Payer: Medicare Other | Admitting: Family Medicine

## 2022-10-23 ENCOUNTER — Encounter: Payer: Self-pay | Admitting: Family Medicine

## 2022-10-23 ENCOUNTER — Ambulatory Visit (INDEPENDENT_AMBULATORY_CARE_PROVIDER_SITE_OTHER): Payer: Medicare Other

## 2022-10-23 VITALS — BP 160/90 | HR 70 | Temp 97.8°F | Wt 170.0 lb

## 2022-10-23 DIAGNOSIS — M7989 Other specified soft tissue disorders: Secondary | ICD-10-CM | POA: Diagnosis not present

## 2022-10-23 DIAGNOSIS — R600 Localized edema: Secondary | ICD-10-CM

## 2022-10-23 DIAGNOSIS — R29898 Other symptoms and signs involving the musculoskeletal system: Secondary | ICD-10-CM

## 2022-10-23 DIAGNOSIS — M79672 Pain in left foot: Secondary | ICD-10-CM

## 2022-10-23 NOTE — Patient Instructions (Signed)
Post op-surgical shoe.

## 2022-10-23 NOTE — Progress Notes (Signed)
Subjective:    Patient ID: Audrey Brown, female    DOB: 15-Apr-1946, 76 y.o.   MRN: [id number]  Chief Complaint  Patient presents with   Toe Pain    Pt present with daughter, Langley Gauss. Reports L toe pain on second toe from the big toe. Daughter reports patient stumped her toe about 3 months ago and patient has osteoporosis.   Pt accompanied by her daughter.  HPI Patient was seen today for ongoing concern.  Pt stubbed L 2nd toe a few months ago.  Toe became swollen and painful.  Felt like when pt broke little toe in the past.  Symptoms improved, but have been returning off and on.  Pt with edema, pain, inability to bend toe or apply pressure.  Wearing house slippers as unable to get foot in other shoes.  Ambulating around the house using a walker at times.  Unsure if hit toe again.  Past Medical History:  Diagnosis Date   Arthritis    lower back   Bipolar disorder ([place])    Depression    GERD (gastroesophageal reflux disease)    Glaucoma    bilateral   Hiatal hernia    Loss of vision    right eye   Parkinson disease    Poor short-term memory    Sagittal band rupture at metacarpophalangeal joint 11/2016   right long finger    Allergies  Allergen Reactions   Celebrex [Celecoxib] Hives   Morphine And Related Hives   Penicillins Hives, Nausea Only and Other (See Comments)    Has patient had a PCN reaction causing immediate rash, facial/tongue/throat swelling, SOB or lightheadedness with hypotension: Yes Has patient had a PCN reaction causing severe rash involving mucus membranes or skin necrosis: Yes Has patient had a PCN reaction that required hospitalization: Yes, was already in the hospital  Has patient had a PCN reaction occurring within the last 10 years: No If all of the above answers are "NO", then may proceed with Cephalosporin use.    Risperdal [Risperidone] Other (See Comments)    Tremors and seizures   Statins Other (See Comments)    MUSCLE ACHES   Lisinopril Cough    Hydrocodone Hives    Pt state that she takes it with Benadryl    ROS General: Denies fever, chills, night sweats, changes in weight, changes in appetite HEENT: Denies headaches, ear pain, changes in vision, rhinorrhea, sore throat CV: Denies CP, palpitations, SOB, orthopnea Pulm: Denies SOB, cough, wheezing GI: Denies abdominal pain, nausea, vomiting, diarrhea, constipation GU: Denies dysuria, hematuria, frequency, vaginal discharge Msk: Denies muscle cramps, joint pains  +L 2nd toe pain and edema Neuro: Denies weakness, numbness, tingling Skin: Denies rashes, bruising Psych: Denies depression, anxiety, hallucinations      Objective:    Blood pressure (!) 140/90, pulse 70, temperature 97.8 F (36.6 C), temperature source Oral, weight 170 lb (77.1 kg), SpO2 98 %.  Gen. Pleasant, well-nourished, in no distress, normal affect   HEENT: Joy/AT, face symmetric, wearing sunshade screen behind glasses, nares patent without drainage Lungs: no accessory muscle use, CTAB, no wheezes or rales Cardiovascular: RRR, no m/r/g, no peripheral edema Musculoskeletal: Edema of L 2nd digit and slight extension.  TTP of Lateral midfoot proximal to L 5th digit and of L 2nd digit of L foot.  No deformities, no cyanosis or clubbing, normal tone Neuro:  A&Ox3, CN II-XII intact, sitting in motorized scooter. Skin:  Warm,dry, intact.  L 2nd digiti with circular area of erythema  at [place].   Wt Readings from Last 3 Encounters:  10/23/22 170 lb (77.1 kg)  08/12/22 180 lb (81.6 kg)  07/20/22 180 lb (81.6 kg)    Lab Results  Component Value Date   WBC 6.2 03/25/2022   HGB 13.6 03/25/2022   HCT 40.8 03/25/2022   PLT 280 03/25/2022   GLUCOSE 98 03/25/2022   CHOL 225 (H) 12/25/2015   TRIG 91.0 12/25/2015   HDL 59.50 12/25/2015   LDLCALC 147 (H) 12/25/2015   ALT 13 03/30/2021   AST 13 (L) 03/30/2021   NA 142 03/25/2022   K 3.6 03/25/2022   CL 110 03/25/2022   CREATININE 0.73 03/25/2022   BUN 7  (L) 03/25/2022   CO2 27 03/25/2022   TSH 1.83 04/01/2018   INR 0.93 04/12/2011    Assessment/Plan:  Left foot pain - Plan: DG Foot Complete Left  Edema of toe - Plan: DG Foot Complete Left  Suspected fracture of bone - Plan: DG Foot Complete Left  Suspected fx of L 2nd digit given edema and pain.  Concern for additional fracture given TTP of lateral mid foot.  Will obtain XR.  Supportive care: tylenol, heat, etc.  Will wear post op surgical shoe-has one at home.  Advised likely to take several wks to fully heal.  Further recs based on imaging.  F/u prn  On day of service 31 mins spent caring for pt, reviewing chart, developing plan, etc.  Grier Mitts, MD

## 2022-10-26 DIAGNOSIS — M5416 Radiculopathy, lumbar region: Secondary | ICD-10-CM | POA: Diagnosis not present

## 2022-10-26 DIAGNOSIS — S22080A Wedge compression fracture of T11-T12 vertebra, initial encounter for closed fracture: Secondary | ICD-10-CM | POA: Diagnosis not present

## 2022-10-26 DIAGNOSIS — S22080G Wedge compression fracture of T11-T12 vertebra, subsequent encounter for fracture with delayed healing: Secondary | ICD-10-CM | POA: Diagnosis not present

## 2022-11-05 DIAGNOSIS — M47814 Spondylosis without myelopathy or radiculopathy, thoracic region: Secondary | ICD-10-CM | POA: Diagnosis not present

## 2022-11-16 ENCOUNTER — Other Ambulatory Visit: Payer: Self-pay | Admitting: Family Medicine

## 2022-11-16 DIAGNOSIS — I10 Essential (primary) hypertension: Secondary | ICD-10-CM

## 2022-11-25 DIAGNOSIS — M47814 Spondylosis without myelopathy or radiculopathy, thoracic region: Secondary | ICD-10-CM | POA: Diagnosis not present

## 2022-12-10 DIAGNOSIS — S22080S Wedge compression fracture of T11-T12 vertebra, sequela: Secondary | ICD-10-CM | POA: Diagnosis not present

## 2022-12-10 DIAGNOSIS — M47814 Spondylosis without myelopathy or radiculopathy, thoracic region: Secondary | ICD-10-CM | POA: Diagnosis not present

## 2022-12-15 ENCOUNTER — Telehealth: Payer: Self-pay | Admitting: Family Medicine

## 2022-12-15 NOTE — Telephone Encounter (Signed)
Left message for patient to call back and schedule Medicare Annual Wellness Visit (AWV) either virtually or in office. Left  my Herbie Drape number [phone]   Last AWV 11/14/21 please schedule with Nurse Health Adviser   45 min for awv-i  in office appointments 30 min for awv-s & awv-i phone/virtual appointments

## 2022-12-17 ENCOUNTER — Ambulatory Visit: Payer: Medicare Other

## 2023-01-06 ENCOUNTER — Other Ambulatory Visit: Payer: Self-pay | Admitting: Family Medicine

## 2023-01-06 DIAGNOSIS — M47817 Spondylosis without myelopathy or radiculopathy, lumbosacral region: Secondary | ICD-10-CM

## 2023-01-07 ENCOUNTER — Other Ambulatory Visit: Payer: Self-pay

## 2023-01-07 DIAGNOSIS — I10 Essential (primary) hypertension: Secondary | ICD-10-CM

## 2023-01-07 DIAGNOSIS — M8000XG Age-related osteoporosis with current pathological fracture, unspecified site, subsequent encounter for fracture with delayed healing: Secondary | ICD-10-CM

## 2023-01-08 ENCOUNTER — Telehealth: Payer: Self-pay

## 2023-01-08 ENCOUNTER — Other Ambulatory Visit: Payer: Self-pay | Admitting: Family Medicine

## 2023-01-08 NOTE — Progress Notes (Signed)
Patient ID: Audrey Brown, female   DOB: 02-Jun-1946, 77 y.o.   MRN: [id number]  Care Management & Coordination Services Pharmacy Team  Reason for Encounter: Chart Prep for initial encounter with Audrey Brown on 01/25/23 at 10:30 am in office.  Have you seen any other providers since your last visit? Patient reports none  Any changes in your medications or health? Patient reports no  Any side effects from any medications? Patient reports not that she can tell she has not had any recent medication changes.  Do you have an symptoms or problems not managed by your medications? Patient reports none  Has your provider asked that you check blood pressure, blood sugar, or follow special diet at home? Patient reports she has a blood pressure cuff and she will bring with her to appointment to check for accuracy.  Do you have any problems getting your medications? Patient reports she is happy with cost of her medications and pharmacy, she reports an issue or 2 in the past but none recently.  Patient aware to bring blood pressure cuff, medications that do not need refrigeration and supplements to appointment    Chart review:  Recent office visits:  10/23/22 [name], MD - Patient presented for Left foot pain and other concerns. Stopped Calcium Carb-Cholecalciferol. Stopped Temazepam.   07/20/22 [name], MD - Patient presented for Closed wedge compression fracture of 12 th thoracic vertebra with delayed healing and other concerns. Prescribed Percocet. Prescribed Tizanidine. Stopped Cyclobenzaprine.    Recent consult visits:  01/01/23 [name],[name] ([facility]) - Patient presented for Eye Glasses and contact lenses. No medication changes noted.  12/10/22 Bintrim, Corliss Parish ([facility]) - Patient presented for Compression fracture of T12 vertebrae and other concerns. No other visit details available.  11/25/22 Patient presented to [name] for Diagnostic  Imaging. No other visit details available.   11/05/22  Bintrim, Corliss Parish ([facility]) - Patient presented for Spondylosis without myelopathy or radiculopathy thoracic region and other concerns.   10/26/22  Bintrim, Corliss Parish ([facility]) - Patient presented for Kythoplasty T12. No other visit details available.  09/21/22 [name] - Patient presented for Compression fracture and Diagnostic imaging. No other visit details available.   09/21/22 [name] - Patient presented for Unspecified dementia and other concerns. Prescribed Alendronate 70 mg.   08/12/22 Esterwood, Amy S, PA-C [name]) - Patient presented for Dysphagia unspecified type and other concerns. Changed Esomeprazole. Stopped Eszopiclone. Stopped Furosemide. Stopped Ketoconazole. Stopped Methylprednisolone.  08/10/22 [name], NP [facility]) - Patient presented for compression fracture of T12 vertebra and other concerns. No medication changes.  Hospital visits:  None in previous 6 months  Fill History : ALBUTEROL HFA 90 MCG INHALER 09/30/2022 17   ALPRAZOLAM 0.25MG  TABLETS 01/29/2022 30   BUPROPION HCL XL 300 MG TABLET 11/06/2022 90   BUSPIRONE HCL 10 MG TABLET 11/14/2022 90   ESOMEPRAZOLE MAG DR 40 MG CAP 11/14/2022 90   IBUPROFEN 800 MG TABLET 11/17/2022 90   LAMOTRIGINE ER 200 MG TABLET 01/06/2023 90   LOSARTAN POTASSIUM 50 MG TAB 11/16/2022 90   METHOCARBAMOL 500 MG TABLET 01/06/2023 30   OXYBUTYNIN CL ER 10 MG TABLET 12/12/2022 90   OXYCODONE 5MG  IMMEDIATE REL TABS 12/10/2022 5   PROMETHAZINE 25 MG TABLET 01/06/2023 30   PROPRANOLOL 10 MG TABLET 11/17/2022 90   FORTEO 600 MCG/2.4 ML PEN INJ 12/30/2022 28   TIZANIDINE HCL 4 MG TABLET 01/06/2023  90   TRAMADOL HCL 50 MG TABLET 09/30/2022 45   TRAZODONE 50 MG TABLET 12/02/2022 90    Star Rating Drugs:  Losartan 50 mg - Last filled 11/16/22 90 DS at [facility]   Care Gaps: Hepatitis C Screen -  Overdue TRDAP - Overdue Zoster Vaccine - Overdue Colonoscopy - Overdue AWV - 11/14/21 COVID Booster - Overdue      Pamala Duffel CMA Clinical Pharmacist Assistant [phone]

## 2023-01-19 NOTE — Progress Notes (Signed)
Care Management & Coordination Services Pharmacy Note  01/19/2023 Name:  Audrey Brown MRN:  [id number] DOB:  09-08-1946  Summary: BP not at goal <140/90 LDL not at goal <100 (last checked 2017) Denies any falls this year Reports well-controlled mood, using xanax infrequently (less than once a week)  Recommendations/Changes made from today's visit: -Check BP daily for next 2 weeks and keep a log. If elevated, may need to consider increasing losartan -ORDER updated lipid panel, with PCP approval (consider addition of zetia if elevated, pt intolerant to statins) -Continue forteo for bones, counseled on calcium intake in diet or supplment to 1200mg  daily  Follow up plan: BP call in 2 weeks Pharmacist visit in 5 months   Subjective: Audrey Brown is an 77 y.o. year old female who is a primary patient of Laurey Morale, MD.  The care coordination team was consulted for assistance with disease management and care coordination needs.    Engaged with patient face to face for initial visit. Caregiver Langley Gauss who handles medications is also present. Brings medication list and wrist cuff to the visit.  Recent office visits: 10/23/22 Audrey Ruddy, MD - Patient presented for Left foot pain and other concerns. Stopped Calcium Carb-Cholecalciferol. Stopped Temazepam.    07/20/22 Laurey Morale, MD - Patient presented for Closed wedge compression fracture of 12 th thoracic vertebra with delayed healing and other concerns. Prescribed Percocet. Prescribed Tizanidine. Stopped Cyclobenzaprine.  Recent consult visits: 01/01/23 [name],[name] ([facility]) - Patient presented for Eye Glasses and contact lenses. No medication changes noted.   12/10/22 [place], [place] [facility] ([facility]) - Patient presented for Compression fracture of T12 vertebrae and other concerns. No other visit details available.   11/25/22 Patient presented to [facility] for Diagnostic Imaging. No other visit  details available.    11/05/22  [facility] ([facility]) - Patient presented for Spondylosis without myelopathy or radiculopathy thoracic region and other concerns.    10/26/22  [facility]rim, [facility] ([facility]) - Patient presented for Kythoplasty T12. No other visit details available.   09/21/22 Vinnie Level - Patient presented for Compression fracture and Diagnostic imaging. No other visit details available.    09/21/22 Tresa Endo - Patient presented for Unspecified dementia and other concerns. Prescribed Alendronate 70 mg.    08/12/22 Esterwood, Amy S, PA-C [facility]) - Patient presented for Dysphagia unspecified type and other concerns. Changed Esomeprazole. Stopped Eszopiclone. Stopped Furosemide. Stopped Ketoconazole. Stopped Methylprednisolone.   08/10/22 Vinnie Level, NP [facility]) - Patient presented for compression fracture of T12 vertebra and other concerns. No medication changes.  Hospital visits: None in previous 6 months   Objective:  Lab Results  Component Value Date   CREATININE 0.73 03/25/2022   BUN 7 (L) 03/25/2022   GFR 54.37 (L) 12/19/2020   GFRNONAA >60 03/25/2022   GFRAA >60 04/20/2017   NA 142 03/25/2022   K 3.6 03/25/2022   CALCIUM 8.4 (L) 03/25/2022   CO2 27 03/25/2022   GLUCOSE 98 03/25/2022    Lab Results  Component Value Date/Time   GFR 54.37 (L) 12/19/2020 03:00 PM   GFR 70.27 08/23/2018 09:26 AM    Last diabetic Eye exam: No results found for: "HMDIABEYEEXA"  Last diabetic Foot exam: No results found for: "HMDIABFOOTEX"   Lab Results  Component Value Date   CHOL 225 (H) 12/25/2015   HDL 59.50 12/25/2015   LDLCALC 147 (H) 12/25/2015   TRIG 91.0 12/25/2015   CHOLHDL  4 12/25/2015       Latest Ref Rng & Units 03/30/2021   12:16 PM 04/01/2018   10:21 AM 12/25/2015    9:36 AM  Hepatic Function  Total Protein 6.5 - 8.1 g/dL 7.1  6.3  6.9   Albumin 3.5 - 5.0 g/dL 3.8  4.1  4.2   AST 15 - 41 U/L 13   12  13    ALT 0 - 44 U/L 13  11  12    Alk Phosphatase 38 - 126 U/L 69  62  56   Total Bilirubin 0.3 - 1.2 mg/dL 0.4  0.5  0.5   Bilirubin, Direct 0.0 - 0.3 mg/dL  0.1  0.1     Lab Results  Component Value Date/Time   TSH 1.83 04/01/2018 10:21 AM   TSH 3.71 10/26/2016 10:05 AM   FREET4 0.97 04/01/2018 10:21 AM       Latest Ref Rng & Units 03/25/2022   11:05 AM 02/23/2022    4:18 PM 03/30/2021   12:16 PM  CBC  WBC 4.0 - 10.5 K/uL 6.2  9.8  9.3   Hemoglobin 12.0 - 15.0 g/dL 13.6  13.9  14.2   Hematocrit 36.0 - 46.0 % 40.8  42.5  43.5   Platelets 150 - 400 K/uL 280  319  382     Lab Results  Component Value Date/Time   VD25OH 16 (L) 12/26/2010 12:25 PM   VITAMINB12 415 04/01/2018 10:21 AM   VITAMINB12 357 10/26/2016 10:05 AM    Clinical ASCVD: No  The ASCVD Risk score (Arnett DK, et al., 2019) failed to calculate for the following reasons:   Cannot find a previous HDL lab   Cannot find a previous total cholesterol lab       10/23/2022   11:51 AM 07/20/2022    2:34 PM 03/02/2022    5:34 PM  Depression screen PHQ 2/9  Decreased Interest 1 1 1   Down, Depressed, Hopeless 0 0 0  PHQ - 2 Score 1 1 1   Altered sleeping 3 3 3   Tired, decreased energy 3 3 3   Change in appetite 3 3 2   Feeling bad or failure about yourself  0 1 0  Trouble concentrating 1 1 2   Moving slowly or fidgety/restless 3 1 2   Suicidal thoughts 0 0 0  PHQ-9 Score 14 13 13   Difficult doing work/chores  Somewhat difficult Somewhat difficult     Social History   Tobacco Use  Smoking Status Never  Smokeless Tobacco Never   BP Readings from Last 3 Encounters:  10/23/22 (!) 160/90  08/12/22 130/80  07/20/22 130/80   Pulse Readings from Last 3 Encounters:  10/23/22 70  08/12/22 72  07/20/22 64   Wt Readings from Last 3 Encounters:  10/23/22 170 lb (77.1 kg)  08/12/22 180 lb (81.6 kg)  07/20/22 180 lb (81.6 kg)   BMI Readings from Last 3 Encounters:  10/23/22 31.09 kg/m  08/12/22 32.92 kg/m   07/20/22 32.92 kg/m    Allergies  Allergen Reactions   Celebrex [Celecoxib] Hives   Morphine And Related Hives   Penicillins Hives, Nausea Only and Other (See Comments)    Has patient had a PCN reaction causing immediate rash, facial/tongue/throat swelling, SOB or lightheadedness with hypotension: Yes Has patient had a PCN reaction causing severe rash involving mucus membranes or skin necrosis: Yes Has patient had a PCN reaction that required hospitalization: Yes, was already in the hospital  Has patient had a PCN reaction occurring  within the last 10 years: No If all of the above answers are "NO", then may proceed with Cephalosporin use.    Risperdal [Risperidone] Other (See Comments)    Tremors and seizures   Statins Other (See Comments)    MUSCLE ACHES   Lisinopril Cough   Hydrocodone Hives    Pt state that she takes it with Benadryl    Medications Reviewed Today     Reviewed by Audrey Ruddy, MD (Physician) on 10/23/22 at 1136  Med List Status: <None>   Medication Order Taking? Sig Documenting Provider Last Dose Status Informant  albuterol (VENTOLIN HFA) 108 (90 Base) MCG/ACT inhaler [id number] Yes Inhale 2 puffs into the lungs every 4 (four) hours as needed for wheezing or shortness of breath. Laurey Morale, MD Taking Active   ALPRAZolam Duanne Moron) 0.25 MG tablet [id number] Yes TAKE 1 TABLET THREE TIMES A DAY AS NEEDED FOR ANXIETY Laurey Morale, MD Taking Active   AMBULATORY NON FORMULARY MEDICATION [id number] Yes Take 10 mg by mouth 3 (three) times daily. Medication Name: Domperidone 10 mg Esterwood, Amy S, PA-C Taking Active   buPROPion (WELLBUTRIN XL) 300 MG 24 hr tablet [id number] Yes Take 300 mg by mouth daily.  [provider] Taking Active Care Giver           Med Note Raynelle Bring Apr 06, 2016  1:26 PM)    busPIRone (BUSPAR) 10 MG tablet [id number] Yes Take 10 mg by mouth 2 (two) times daily.  [provider] Taking Active Care Giver   esomeprazole (NEXIUM) 40 MG capsule [id number] Yes Take 1 capsule (40 mg total) by mouth 2 (two) times daily before a meal. Esterwood, Amy S, PA-C Taking Active   ibuprofen (ADVIL) 800 MG tablet [id number] Yes TAKE 1 TABLET EVERY 8 HOURS AS NEEDED FOR PAIN  Patient taking differently: Take 800 mg by mouth every 8 (eight) hours as needed for fever or mild pain.   Laurey Morale, MD Taking Active   LamoTRIgine XR (LAMICTAL XR) 200 MG TB24 [id number] Yes Take 1 tablet (200 mg total) by mouth daily. Laurey Morale, MD Taking Active Care Giver  losartan (COZAAR) 50 MG tablet [id number] Yes TAKE 1 TABLET DAILY Laurey Morale, MD Taking Active   Magnesium 500 MG CAPS [id number] Yes Take 400 mg by mouth at bedtime.  [provider] Taking Active Care Giver  Melatonin 5 MG CAPS [id number] Yes Take 5 mg by mouth at bedtime. [provider] Taking Active Care Giver  OSCIMIN 0.125 MG SUBL [id number] Yes PLACE 1 TABLET UNDER THE TONGUE EVERY 6 HOURS AS NEEDED FOR CRAMPING  Patient taking differently: Place 0.125 mg under the tongue every 6 (six) hours as needed (cramping).   Jerene Bears, MD Taking Active   oxybutynin (DITROPAN-XL) 10 MG 24 hr tablet [id number] Yes TAKE 1 TABLET AT BEDTIME Laurey Morale, MD Taking Active   promethazine (PHENERGAN) 25 MG tablet [id number] Yes TAKE 1 TABLET EVERY 4 HOURS AS NEEDED FOR NAUSEA  Patient taking differently: Take 25 mg by mouth every 4 (four) hours as needed for nausea.   Laurey Morale, MD Taking Active   propranolol (INDERAL) 10 MG tablet [id number] Yes Take 10 mg by mouth 2 (two) times daily. [provider] Taking Active   tiZANidine (ZANAFLEX) 4 MG tablet [id number] Yes Take 1 tablet (4 mg total) by mouth every 6 (six) hours as needed for muscle spasms. Laurey Morale, MD Taking  Active   traMADol (ULTRAM) 50 MG tablet [id number] Yes TAKE 2 TABLETS EVERY 6 HOURS AS NEEDED FOR MODERATE PAIN Laurey Morale, MD Taking Active              SDOH:  (Social Determinants of Health) assessments and interventions performed: Yes SDOH Interventions    Flowsheet Row Clinical Support from 11/13/2020 in [place] at [place] Interventions Intervention Not Indicated  Housing Interventions Intervention Not Indicated  Transportation Interventions Intervention Not Indicated  Financial Strain Interventions Intervention Not Indicated  Physical Activity Interventions Intervention Not Indicated  Stress Interventions Intervention Not Indicated  Social Connections Interventions Intervention Not Indicated       Medication Assistance: None required.  Patient affirms current coverage meets needs.  Medication Access: Within the past 30 days, how often has patient missed a dose of medication? No Is a pillbox or other method used to improve adherence? Yes  Factors that may affect medication adherence? no barriers identified Are meds synced by current pharmacy? No  Are meds delivered by current pharmacy? Yes  Does patient experience delays in picking up medications due to transportation concerns? No   [facility] Services Reviewed: Is patient disadvantaged to use [facility]?: Yes  Current Rx insurance plan: TRIC[employer] Name and location of Current pharmacy:  [facility] [place] [address] [place] [address] Phone: [phone] Fax: [place], [address] AT [facility] OF [place] [address] [place] [address] Phone: [phone] Fax: [phone]  [facility] services reviewed with patient today?: No  Patient requests to transfer care to [facility]?: No  Reason patient declined to change pharmacies: Disadvantaged due to insurance/mail order  Compliance/Adherence/Medication fill history: Care Gaps: Hepatitis C Screen -  Overdue TRDAP - Overdue Zoster Vaccine - Overdue Colonoscopy - Overdue AWV - 11/14/21 COVID Booster - Overdue  Star-Rating Drugs: Losartan 50mg  PDC 100%   Assessment/Plan   Hypertension (BP goal <130/80) -Uncontrolled -Current treatment: Losartan 50mg  1 qd Appropriate, Query Effective -Medications previously tried: Lisinopril (Cough), Verapamil -Current home readings: no log but is checking daily -Current dietary habits: does not add salt to food, does have coffee daily -Current exercise habits: limited due to current back fracture, going to PT -Denies hypotensive/hypertensive symptoms -Educated on BP goals and benefits of medications for prevention of heart attack, stroke and kidney damage; Daily salt intake goal < 2300 mg; Importance of home blood pressure monitoring; Proper BP monitoring technique; -Counseled to monitor BP at home daily, document, and provide log at future appointments -Recommended to continue current medication -Will call in 2 weeks to get BP log, if BP has stayed elevated, will need to consider dose increase on losartan  Hyperlipidemia: (LDL goal < 100) -Uncontrolled -Current treatment: None -Medications previously tried: statins (muscle aches)  -Current dietary patterns: only eating soft foods currently due to esophagus/swallowing issues. -Current exercise habits: see above -Educated on Cholesterol goals;  -Recommended updated lipid panel, pneding pcp approval  Depression/Anxiety/Bipolar (Goal: Stable mood that allows for ADLs) -Controlled -Current treatment: Alprazolam 0.25mg  TID prn Appropriate, Effective, Safe, Accessible Bupropion XL 300mg  1 qd Appropriate, Effective, Safe, Accessible Buspirone 10mg  BID Appropriate, Effective, Safe, Accessible Lamotrigine XR 200mg  1 qd Appropriate, Effective, Safe, Accessible -Medications previously tried/failed: Depakote -Educated on Benefits of medication for symptom control -Recommended to continue current  medication  Osteoporosis /  Osteopenia (Goal Prevent bone fracture) -Not ideally controlled -Last DEXA Scan: 02/01/2018   T-Score femoral neck: -2.1  10-year probability of major osteoporotic fracture: 18.6%  10-year probability of hip fracture: 3.6%  Previous history of fractures -Patient is a candidate for pharmacologic treatment due to T-Score -1.0 to -2.5 and 10-year risk of hip fracture > 3% -Current treatment  Forteo Daily Appropriate, Query Effective, Safe, Accessible Vit D 2000 UNITS daily Appropriate, Effective, Safe, Accessible -Medications previously tried: None  -Recommend [phone] units of vitamin D daily. Recommend 1200 mg of calcium daily from dietary and supplemental sources. -Recommended to continue current medication  GERD  -Not assessed today -Current treatment  Nexium 40mg  1 qd Appropriate, Effective, Safe, Accessible Hyoscamine 0.125mg  sl q6h prn cramping Appropriate, Effective, Safe, Accessible Promethazine 25mg  q4h prn nausea Appropriate, Effective, Safe, Accessible  Parkinson/Tremor  -Not assessed today -Current treatment  Propranolol 10mg  BID Appropriate, Effective, Safe, Accessible   Query OAB (Goal: Bladder Control) -Not assessed today -Current treatment  Oxybutynin XL 10mg  1 qhs Appropriate, Effective, Safe, Accessible  Chronic Pain  -Not assessed today -Current treatment  Methocarbamol 500mg  BID prn Appropriate, Effective, Safe, Accessible Ibuprofen 800mg  1 q8h prn Appropriate, Effective, Safe, Accessible Tramadol 50mg  2 tabs q6h pain Appropriate, Effective, Safe, Accessible Percocet 5-325mg  q4h prn pain Appropriate, Effective, Safe, Accessible Senna PRN Appropriate, Effective, Safe, Accessible -Counseled on sedation/constipation risk. Discussed docusate prn if senna does not produce regular BM.  Insomnia  -Not assessed today -Current treatment  Melatonin 10mg  1 daily Appropriate, Effective, Safe, Accessible Trazodone 50mg  1 daily at bedtime  Appropriate, Effective, Safe, Accessible      Maren Reamer Clinical Pharmacist [phone]

## 2023-01-21 DIAGNOSIS — M47816 Spondylosis without myelopathy or radiculopathy, lumbar region: Secondary | ICD-10-CM | POA: Diagnosis not present

## 2023-01-25 ENCOUNTER — Ambulatory Visit: Payer: Medicare Other

## 2023-01-26 ENCOUNTER — Telehealth: Payer: Self-pay | Admitting: Family Medicine

## 2023-01-26 DIAGNOSIS — E785 Hyperlipidemia, unspecified: Secondary | ICD-10-CM

## 2023-01-26 MED ORDER — ALPRAZOLAM 0.25 MG PO TABS: ORAL_TABLET | ORAL | 5 refills | Status: DC

## 2023-01-26 NOTE — Telephone Encounter (Signed)
-----   Message from Maren Reamer, [facility] sent at 01/25/2023 12:32 PM EDT ----- Hello,  Had an initial visit with Ms. Bogus today. She is due for an updated lipid panel (last 2017) and is willing to get that updated if an order could be placed?  Also requesting a refill on her Xanax 0.25mg  TID PRN If possible.   [facility] DRUG STORE [place], [place] AT [place]  Phone: [phone] Fax: [phone]   Thank you! [place] Pharmacist [phone]

## 2023-01-26 NOTE — Telephone Encounter (Signed)
Done

## 2023-01-28 ENCOUNTER — Ambulatory Visit
Admission: RE | Admit: 2023-01-28 | Discharge: 2023-01-28 | Disposition: A | Discharge status: Home / Self Care | Payer: Medicare Other | Source: Ambulatory Visit | Attending: Family Medicine | Admitting: Family Medicine

## 2023-01-28 DIAGNOSIS — Z1231 Encounter for screening mammogram for malignant neoplasm of breast: Secondary | ICD-10-CM | POA: Diagnosis not present

## 2023-02-04 ENCOUNTER — Other Ambulatory Visit: Payer: Self-pay | Admitting: Family Medicine

## 2023-02-12 ENCOUNTER — Telehealth: Payer: Self-pay

## 2023-02-12 NOTE — Progress Notes (Signed)
Care Management & Coordination Services Pharmacy Team  Reason for Encounter: Hypertension  Contacted patient to discuss hypertension disease state. Spoke with patient on 02/12/2023     Current antihypertensive regimen:  Losartan 50mg  1 qd   Patient verbally confirms she is taking the above medications as directed. Yes  How often are you checking your Blood Pressure? daily  she checks her blood pressure in the morning before taking her medication.  Current home BP readings: 140/84 131/68 123/61 115/67  Patient reports she does not use any salt with foods but reports that she has been checking her pressures in the morning before taking her medication. Advised her to wait an hour or so after having her medication to check, she was in agreement.   Any readings above 180/100? No If yes any symptoms of hypertensive emergency? patient denies any symptoms of high blood pressure  What recent interventions/DTPs have been made by any provider to improve Blood Pressure control since last CPP Visit: Patient reports none  Any recent hospitalizations or ED visits since last visit with CPP? No  Adherence Review: Is the patient currently on ACE/ARB medication? Yes Does the patient have >5 day gap between last estimated fill dates? No  Star Rating Drugs:  Losartan 50 mg - Last filled 11/16/22 90 DS at [facility]    Chart Updates: Recent office visits:  None  Recent consult visits:  None   Hospital visits:  None in previous 6 months  Medications: Outpatient Encounter Medications as of 02/12/2023  Medication Sig   albuterol (VENTOLIN HFA) 108 (90 Base) MCG/ACT inhaler Inhale 2 puffs into the lungs every 4 (four) hours as needed for wheezing or shortness of breath.   ALPRAZolam (XANAX) 0.25 MG tablet TAKE 1 TABLET THREE TIMES A DAY AS NEEDED FOR ANXIETY   AMBULATORY NON FORMULARY MEDICATION Take 10 mg by mouth 3 (three) times daily. Medication Name: Domperidone 10 mg   buPROPion  (WELLBUTRIN XL) 300 MG 24 hr tablet Take 300 mg by mouth daily.    busPIRone (BUSPAR) 10 MG tablet Take 10 mg by mouth 2 (two) times daily.    Cholecalciferol (VITAMIN D) 50 MCG (2000 UT) CAPS Take 2,000 Units by mouth daily.   cyanocobalamin (VITAMIN B12) 1000 MCG tablet Take 1,000 mcg by mouth daily.   esomeprazole (NEXIUM) 40 MG capsule Take 1 capsule (40 mg total) by mouth 2 (two) times daily before a meal.   ibuprofen (ADVIL) 800 MG tablet TAKE 1 TABLET EVERY 8 HOURS AS NEEDED FOR PAIN (Patient taking differently: Take 800 mg by mouth every 8 (eight) hours as needed for fever or mild pain.)   LamoTRIgine XR (LAMICTAL XR) 200 MG TB24 Take 1 tablet (200 mg total) by mouth daily.   losartan (COZAAR) 50 MG tablet TAKE 1 TABLET DAILY   Magnesium 500 MG CAPS Take 400 mg by mouth at bedtime.    Melatonin 5 MG CAPS Take 5 mg by mouth at bedtime.   METHOCARBAMOL PO Take 500 mg by mouth 2 (two) times daily as needed (pain).   OSCIMIN 0.125 MG SUBL PLACE 1 TABLET UNDER THE TONGUE EVERY 6 HOURS AS NEEDED FOR CRAMPING (Patient taking differently: Place 0.125 mg under the tongue every 6 (six) hours as needed (cramping).)   oxybutynin (DITROPAN-XL) 10 MG 24 hr tablet TAKE 1 TABLET AT BEDTIME   oxyCODONE-acetaminophen (PERCOCET/ROXICET) 5-325 MG tablet Take 1 tablet by mouth every 4 (four) hours as needed for severe pain.   promethazine (PHENERGAN) 25 MG  tablet TAKE 1 TABLET EVERY 4 HOURS AS NEEDED FOR NAUSEA (Patient taking differently: Take 25 mg by mouth every 4 (four) hours as needed for nausea.)   propranolol (INDERAL) 10 MG tablet Take 10 mg by mouth 2 (two) times daily.   Teriparatide, Recombinant, (FORTEO) 600 MCG/2.4ML SOPN Inject 2.4 mLs into the skin daily.   traMADol (ULTRAM) 50 MG tablet TAKE 2 TABLETS EVERY 6 HOURS AS NEEDED FOR MODERATE PAIN   traZODone (DESYREL) 50 MG tablet Take 50 mg by mouth at bedtime.   No facility-administered encounter medications on file as of 02/12/2023.    Recent  Office Vitals: BP Readings from Last 3 Encounters:  01/25/23 (!) 149/88  10/23/22 (!) 160/90  08/12/22 130/80   Pulse Readings from Last 3 Encounters:  10/23/22 70  08/12/22 72  07/20/22 64    Wt Readings from Last 3 Encounters:  10/23/22 170 lb (77.1 kg)  08/12/22 180 lb (81.6 kg)  07/20/22 180 lb (81.6 kg)     Kidney Function Lab Results  Component Value Date/Time   CREATININE 0.73 03/25/2022 11:05 AM   CREATININE 0.84 02/23/2022 04:18 PM   GFR 54.37 (L) 12/19/2020 03:00 PM   GFRNONAA >60 03/25/2022 11:05 AM   GFRAA >60 04/20/2017 05:18 AM       Latest Ref Rng & Units 03/25/2022   11:05 AM 02/23/2022    4:18 PM 03/30/2021   12:16 PM  BMP  Glucose 70 - 99 mg/dL 98  97  161103   BUN 8 - 23 mg/dL 7  17  11    Creatinine 0.44 - 1.00 mg/dL 0.960.73  0.450.84  4.090.85   Sodium 135 - 145 mmol/L 142  138  139   Potassium 3.5 - 5.1 mmol/L 3.6  3.9  4.0   Chloride 98 - 111 mmol/L 110  106  106   CO2 22 - 32 mmol/L 27  25  24    Calcium 8.9 - 10.3 mg/dL 8.4  8.8  9.3       Pamala DuffelLaresia Green CMA Clinical Pharmacist Assistant [phone]

## 2023-02-15 ENCOUNTER — Other Ambulatory Visit: Payer: Self-pay | Admitting: Family Medicine

## 2023-03-15 ENCOUNTER — Telehealth: Payer: Self-pay | Admitting: Family Medicine

## 2023-03-15 NOTE — Telephone Encounter (Signed)
Contacted Audrey Brown to schedule their annual wellness visit. Appointment made for 03/19/23.  [name] AWV direct phone # [phone]

## 2023-03-19 ENCOUNTER — Ambulatory Visit (INDEPENDENT_AMBULATORY_CARE_PROVIDER_SITE_OTHER): Payer: Medicare Other

## 2023-03-19 VITALS — Ht 62.0 in | Wt 150.0 lb

## 2023-03-19 DIAGNOSIS — Z0000 Encounter for general adult medical examination without abnormal findings: Secondary | ICD-10-CM | POA: Diagnosis not present

## 2023-03-19 NOTE — Patient Instructions (Addendum)
[name] , Thank you for taking time to come for your Medicare Wellness Visit. I appreciate your ongoing commitment to your health goals. Please review the following plan we discussed and let me know if I can assist you in the future.   These are the goals we discussed:  Goals       DIET - INCREASE WATER INTAKE      Prevent falls      Stay Alive (pt-stated)        This is a list of the screening recommended for you and due dates:  Health Maintenance  Topic Date Due   DTaP/Tdap/Td vaccine (1 - Tdap) Never done   COVID-19 Vaccine (5 - 2023-24 season) 04/04/2023*   Zoster (Shingles) Vaccine (1 of 2) 06/19/2023*   Colon Cancer Screening  03/18/2024*   Hepatitis C Screening: USPSTF Recommendation to screen - Ages 18-79 yo.  03/18/2024*   Flu Shot  06/10/2023   Medicare Annual Wellness Visit  03/18/2024   Mammogram  01/27/2025   Pneumonia Vaccine  Completed   DEXA scan (bone density measurement)  Completed   HPV Vaccine  Aged Out  *Topic was postponed. The date shown is not the original due date.    Advanced directives: In chart  Conditions/risks identified: None  Next appointment: Follow up in one year for your annual wellness visit    Preventive Care 65 Years and Older, Female Preventive care refers to lifestyle choices and visits with your health care provider that can promote health and wellness. What does preventive care include? A yearly physical exam. This is also called an annual well check. Dental exams once or twice a year. Routine eye exams. Ask your health care provider how often you should have your eyes checked. Personal lifestyle choices, including: Daily care of your teeth and gums. Regular physical activity. Eating a healthy diet. Avoiding tobacco and drug use. Limiting alcohol use. Practicing safe sex. Taking low-dose aspirin every day. Taking vitamin and mineral supplements as recommended by your health care provider. What happens during an annual  well check? The services and screenings done by your health care provider during your annual well check will depend on your age, overall health, lifestyle risk factors, and family history of disease. Counseling  Your health care provider may ask you questions about your: Alcohol use. Tobacco use. Drug use. Emotional well-being. Home and relationship well-being. Sexual activity. Eating habits. History of falls. Memory and ability to understand (cognition). Work and work Astronomer. Reproductive health. Screening  You may have the following tests or measurements: Height, weight, and BMI. Blood pressure. Lipid and cholesterol levels. These may be checked every 5 years, or more frequently if you are over 6 years old. Skin check. Lung cancer screening. You may have this screening every year starting at age 8 if you have a 30-pack-year history of smoking and currently smoke or have quit within the past 15 years. Fecal occult blood test (FOBT) of the stool. You may have this test every year starting at age 104. Flexible sigmoidoscopy or colonoscopy. You may have a sigmoidoscopy every 5 years or a colonoscopy every 10 years starting at age 23. Hepatitis C blood test. Hepatitis B blood test. Sexually transmitted disease (STD) testing. Diabetes screening. This is done by checking your blood sugar (glucose) after you have not eaten for a while (fasting). You may have this done every 1-3 years. Bone density scan. This is done to screen for osteoporosis. You may have this done starting at  age 2. Mammogram. This may be done every 1-2 years. Talk to your health care provider about how often you should have regular mammograms. Talk with your health care provider about your test results, treatment options, and if necessary, the need for more tests. Vaccines  Your health care provider may recommend certain vaccines, such as: Influenza vaccine. This is recommended every year. Tetanus, diphtheria,  and acellular pertussis (Tdap, Td) vaccine. You may need a Td booster every 10 years. Zoster vaccine. You may need this after age 4. Pneumococcal 13-valent conjugate (PCV13) vaccine. One dose is recommended after age 34. Pneumococcal polysaccharide (PPSV23) vaccine. One dose is recommended after age 53. Talk to your health care provider about which screenings and vaccines you need and how often you need them. This information is not intended to replace advice given to you by your health care provider. Make sure you discuss any questions you have with your health care provider. Document Released: 11/22/2015 Document Revised: 07/15/2016 Document Reviewed: 08/27/2015 Elsevier Interactive Patient Education  2017 [place] Prevention in the Home Falls can cause injuries. They can happen to people of all ages. There are many things you can do to make your home safe and to help prevent falls. What can I do on the outside of my home? Regularly fix the edges of walkways and driveways and fix any cracks. Remove anything that might make you trip as you walk through a door, such as a raised step or threshold. Trim any bushes or trees on the path to your home. Use bright outdoor lighting. Clear any walking paths of anything that might make someone trip, such as rocks or tools. Regularly check to see if handrails are loose or broken. Make sure that both sides of any steps have handrails. Any raised decks and porches should have guardrails on the edges. Have any leaves, snow, or ice cleared regularly. Use sand or salt on walking paths during winter. Clean up any spills in your garage right away. This includes oil or grease spills. What can I do in the bathroom? Use night lights. Install grab bars by the toilet and in the tub and shower. Do not use towel bars as grab bars. Use non-skid mats or decals in the tub or shower. If you need to sit down in the shower, use a plastic, non-slip  stool. Keep the floor dry. Clean up any water that spills on the floor as soon as it happens. Remove soap buildup in the tub or shower regularly. Attach bath mats securely with double-sided non-slip rug tape. Do not have throw rugs and other things on the floor that can make you trip. What can I do in the bedroom? Use night lights. Make sure that you have a light by your bed that is easy to reach. Do not use any sheets or blankets that are too big for your bed. They should not hang down onto the floor. Have a firm chair that has side arms. You can use this for support while you get dressed. Do not have throw rugs and other things on the floor that can make you trip. What can I do in the kitchen? Clean up any spills right away. Avoid walking on wet floors. Keep items that you use a lot in easy-to-reach places. If you need to reach something above you, use a strong step stool that has a grab bar. Keep electrical cords out of the way. Do not use floor polish or wax that makes floors  slippery. If you must use wax, use non-skid floor wax. Do not have throw rugs and other things on the floor that can make you trip. What can I do with my stairs? Do not leave any items on the stairs. Make sure that there are handrails on both sides of the stairs and use them. Fix handrails that are broken or loose. Make sure that handrails are as long as the stairways. Check any carpeting to make sure that it is firmly attached to the stairs. Fix any carpet that is loose or worn. Avoid having throw rugs at the top or bottom of the stairs. If you do have throw rugs, attach them to the floor with carpet tape. Make sure that you have a light switch at the top of the stairs and the bottom of the stairs. If you do not have them, ask someone to add them for you. What else can I do to help prevent falls? Wear shoes that: Do not have high heels. Have rubber bottoms. Are comfortable and fit you well. Are closed at the  toe. Do not wear sandals. If you use a stepladder: Make sure that it is fully opened. Do not climb a closed stepladder. Make sure that both sides of the stepladder are locked into place. Ask someone to hold it for you, if possible. Clearly mark and make sure that you can see: Any grab bars or handrails. First and last steps. Where the edge of each step is. Use tools that help you move around (mobility aids) if they are needed. These include: Canes. Walkers. Scooters. Crutches. Turn on the lights when you go into a dark area. Replace any light bulbs as soon as they burn out. Set up your furniture so you have a clear path. Avoid moving your furniture around. If any of your floors are uneven, fix them. If there are any pets around you, be aware of where they are. Review your medicines with your doctor. Some medicines can make you feel dizzy. This can increase your chance of falling. Ask your doctor what other things that you can do to help prevent falls. This information is not intended to replace advice given to you by your health care provider. Make sure you discuss any questions you have with your health care provider. Document Released: 08/22/2009 Document Revised: 04/02/2016 Document Reviewed: 11/30/2014 Elsevier Interactive Patient Education  2017 [employer].

## 2023-03-19 NOTE — Progress Notes (Signed)
Subjective:   Audrey Brown is a 77 y.o. female who presents for Medicare Annual (Subsequent) preventive examination.  Review of Systems     Virtual Visit via Telephone Note  I connected with  Audrey Brown on 03/19/23 at  3:00 PM EDT by telephone and verified that I am speaking with the correct person using two identifiers.  Location: Patient: Home Provider: Office Persons participating in the virtual visit: patient/Nurse Health Advisor   I discussed the limitations, risks, security and privacy concerns of performing an evaluation and management service by telephone and the availability of in person appointments. The patient expressed understanding and agreed to proceed.  Interactive audio and video telecommunications were attempted between this nurse and patient, however failed, due to patient having technical difficulties OR patient did not have access to video capability.  We continued and completed visit with audio only.  Some vital signs may be absent or patient reported.   [name], LPN  Cardiac Risk Factors include: advanced age (>64men, >7 women);sedentary lifestyle;hypertension     Objective:    Today's Vitals   03/19/23 1458  Weight: 150 lb (68 kg)  Height: 5\' 2"  (1.575 m)   Body mass index is 27.44 kg/m.     03/19/2023    3:11 PM 03/25/2022   10:25 AM 11/14/2021    3:51 PM 10/04/2021   12:07 PM 03/30/2021   11:20 AM 11/13/2020    3:52 PM 10/25/2019   10:20 AM  Advanced Directives  Does Patient Have a Medical Advance Directive? Yes No Yes No No Yes Yes  Type of Estate agent of [place];Living will  Healthcare Power of [place];Living will   Healthcare Power of [place];Living will Healthcare Power of [place];Living will  Does patient want to make changes to medical advance directive? No - Patient declined  No - Patient declined   No - Patient declined No - Patient declined  Copy of Healthcare Power of Attorney in Chart? Yes  - validated most recent copy scanned in chart (See row information)  No - copy requested   No - copy requested No - copy requested  Would patient like information on creating a medical advance directive?  No - Patient declined         Current Medications (verified) Outpatient Encounter Medications as of 03/19/2023  Medication Sig   albuterol (VENTOLIN HFA) 108 (90 Base) MCG/ACT inhaler Inhale 2 puffs into the lungs every 4 (four) hours as needed for wheezing or shortness of breath.   ALPRAZolam (XANAX) 0.25 MG tablet TAKE 1 TABLET THREE TIMES A DAY AS NEEDED FOR ANXIETY   AMBULATORY NON FORMULARY MEDICATION Take 10 mg by mouth 3 (three) times daily. Medication Name: Domperidone 10 mg   buPROPion (WELLBUTRIN XL) 300 MG 24 hr tablet Take 300 mg by mouth daily.    busPIRone (BUSPAR) 10 MG tablet Take 10 mg by mouth 2 (two) times daily.    Cholecalciferol (VITAMIN D) 50 MCG (2000 UT) CAPS Take 2,000 Units by mouth daily.   cyanocobalamin (VITAMIN B12) 1000 MCG tablet Take 1,000 mcg by mouth daily.   esomeprazole (NEXIUM) 40 MG capsule Take 1 capsule (40 mg total) by mouth 2 (two) times daily before a meal.   ibuprofen (ADVIL) 800 MG tablet TAKE 1 TABLET EVERY 8 HOURS AS NEEDED FOR PAIN   LamoTRIgine XR (LAMICTAL XR) 200 MG TB24 Take 1 tablet (200 mg total) by mouth daily.   losartan (COZAAR) 50 MG tablet TAKE 1  TABLET DAILY   Magnesium 500 MG CAPS Take 400 mg by mouth at bedtime.    Melatonin 5 MG CAPS Take 5 mg by mouth at bedtime.   METHOCARBAMOL PO Take 500 mg by mouth 2 (two) times daily as needed (pain).   OSCIMIN 0.125 MG SUBL PLACE 1 TABLET UNDER THE TONGUE EVERY 6 HOURS AS NEEDED FOR CRAMPING (Patient taking differently: Place 0.125 mg under the tongue every 6 (six) hours as needed (cramping).)   oxybutynin (DITROPAN-XL) 10 MG 24 hr tablet TAKE 1 TABLET AT BEDTIME   oxyCODONE-acetaminophen (PERCOCET/ROXICET) 5-325 MG tablet Take 1 tablet by mouth every 4 (four) hours as needed for severe  pain.   promethazine (PHENERGAN) 25 MG tablet TAKE 1 TABLET EVERY 4 HOURS AS NEEDED FOR NAUSEA (Patient taking differently: Take 25 mg by mouth every 4 (four) hours as needed for nausea.)   propranolol (INDERAL) 10 MG tablet Take 10 mg by mouth 2 (two) times daily.   Teriparatide, Recombinant, (FORTEO) 600 MCG/2.4ML SOPN Inject 2.4 mLs into the skin daily.   traMADol (ULTRAM) 50 MG tablet TAKE 2 TABLETS EVERY 6 HOURS AS NEEDED FOR MODERATE PAIN   traZODone (DESYREL) 50 MG tablet Take 50 mg by mouth at bedtime.   No facility-administered encounter medications on file as of 03/19/2023.    Allergies (verified) Celebrex [celecoxib], Morphine and related, Penicillins, Risperdal [risperidone], Statins, Lisinopril, and Hydrocodone   History: Past Medical History:  Diagnosis Date   Arthritis    lower back   Bipolar disorder (HCC)    Depression    GERD (gastroesophageal reflux disease)    Glaucoma    bilateral   Hiatal hernia    Loss of vision    right eye   Parkinson disease    Poor short-term memory    Sagittal band rupture at metacarpophalangeal joint 11/2016   right long finger   Past Surgical History:  Procedure Laterality Date   ANKLE SURGERY     BALLOON DILATION N/A 10/29/2017   Procedure: BALLOON DILATION;  Surgeon: Beverley Fiedler, MD;  Location: [facility];  Service: Gastroenterology;  Laterality: N/A;   CATARACT EXTRACTION Right 08/07/2020   CERVICAL DISC SURGERY  1994   CESAREAN SECTION     CHOLECYSTECTOMY     CYST REMOVAL NECK     ESOPHAGEAL DILATION  12/19/2018   Procedure: ESOPHAGEAL DILATION;  Surgeon: Beverley Fiedler, MD;  Location: [facility];  Service: Gastroenterology;;   ESOPHAGOGASTRODUODENOSCOPY (EGD) WITH ESOPHAGEAL DILATION  07/27/2012   with Propofol   ESOPHAGOGASTRODUODENOSCOPY (EGD) WITH PROPOFOL N/A 06/23/2016   Procedure: ESOPHAGOGASTRODUODENOSCOPY (EGD) WITH PROPOFOL;  Surgeon: Beverley Fiedler, MD;  Location: [facility];  Service: Gastroenterology;   Laterality: N/A;   ESOPHAGOGASTRODUODENOSCOPY (EGD) WITH PROPOFOL N/A 10/29/2017   Procedure: ESOPHAGOGASTRODUODENOSCOPY (EGD) WITH PROPOFOL;  Surgeon: Beverley Fiedler, MD;  Location: [facility];  Service: Gastroenterology;  Laterality: N/A;   ESOPHAGOGASTRODUODENOSCOPY (EGD) WITH PROPOFOL N/A 12/19/2018   Procedure: ESOPHAGOGASTRODUODENOSCOPY (EGD) WITH PROPOFOL;  Surgeon: Beverley Fiedler, MD;  Location: [facility];  Service: Gastroenterology;  Laterality: N/A;   EYE SURGERY     EYE SURGERY  09/2019   R eye procedure to relieve pressure   EYE SURGERY     FOOT NEUROMA SURGERY Left    ORIF DISTAL RADIUS FRACTURE Right 07/04/2010   SAVORY DILATION N/A 06/23/2016   Procedure: SAVORY DILATION;  Surgeon: Beverley Fiedler, MD;  Location: [facility];  Service: Gastroenterology;  Laterality: N/A;   TENDON REPAIR Right 12/01/2016  Procedure: right long finger ulnar sagittal band reconstruction;  Surgeon: Betha Loa, MD;  Location: [facility];  Service: Orthopedics;  Laterality: Right;   TONSILLECTOMY     Family History  Problem Relation Age of Onset   Emphysema Mother    Heart attack Father    Hypertension Other    Lung cancer Other    Colon cancer Neg Hx    Esophageal cancer Neg Hx    Rectal cancer Neg Hx    Stomach cancer Neg Hx    Social History   Socioeconomic History   Marital status: Widowed    Spouse name: Not on file   Number of children: 4   Years of education: 6 years college   Highest education level: Master's degree (e.g., MA, MS, MEng, MEd, MSW, MBA)  Occupational History   Occupation: Retired    Associate Professor: RETIRED    Comment: [employer]  Tobacco Use   Smoking status: Never   Smokeless tobacco: Never  Vaping Use   Vaping Use: Never used  Substance and Sexual Activity   Alcohol use: No    Alcohol/week: 0.0 standard drinks of alcohol   Drug use: No   Sexual activity: Not Currently  Other Topics Concern   Not on file  Social History Narrative   Daughter is  her POA and must come to all visits in GI and [facility]       Patient is right-handed. She lives with her daughter and granddaughter in a split level home with a chair lift. Uses a power chair. She drinks 3-4 cups of coffee a day.      Has 4 daughters, 3 grand children, and 5 great-grand children.   Social Determinants of Health   Financial Resource Strain: Low Risk  (03/19/2023)   Overall Financial Resource Strain (CARDIA)    Difficulty of Paying Living Expenses: Not hard at all  Food Insecurity: No Food Insecurity (03/19/2023)   Hunger Vital Sign    Worried About Running Out of Food in the Last Year: Never true    Ran Out of Food in the Last Year: Never true  Transportation Needs: No Transportation Needs (03/19/2023)   PRAPARE - Administrator, Civil Service (Medical): No    Lack of Transportation (Non-Medical): No  Physical Activity: Inactive (03/19/2023)   Exercise Vital Sign    Days of Exercise per Week: 0 days    Minutes of Exercise per Session: 0 min  Stress: No Stress Concern Present (03/19/2023)   [employer] of Occupational Health - Occupational Stress Questionnaire    Feeling of Stress : Not at all  Social Connections: Moderately Integrated (03/19/2023)   Social Connection and Isolation Panel [NHANES]    Frequency of Communication with Friends and Family: More than three times a week    Frequency of Social Gatherings with Friends and Family: More than three times a week    Attends Religious Services: More than 4 times per year    Active Member of [employer] or Organizations: Yes    Attends Banker Meetings: More than 4 times per year    Marital Status: Widowed    Tobacco Counseling Counseling given: Not Answered   Clinical Intake:  Pre-visit preparation completed: No  Pain : No/denies pain     BMI - recorded: 27.44 Nutritional Status: BMI 25 -29 Overweight Nutritional Risks: None Diabetes: No  How often do you need to have someone help you  when you read instructions, pamphlets, or other  written materials from your doctor or pharmacy?: 3 - Sometimes (Daughter assist)  Diabetic?  No  Interpreter Needed?: No  Information entered by :: Theresa Mulligan LPN   Activities of Daily Living    03/19/2023    3:08 PM 11/21/2022    7:38 PM  In your present state of health, do you have any difficulty performing the following activities:  Hearing? 0 0  Vision? 0 0  Difficulty concentrating or making decisions? 0 1  Walking or climbing stairs? 0 1  Dressing or bathing? 0 1  Doing errands, shopping? 0 1  Preparing Food and eating ? N Y  Using the Toilet? N N  In the past six months, have you accidently leaked urine? N N  Do you have problems with loss of bowel control? N N  Managing your Medications? N Y  Managing your Finances? N N  Housekeeping or managing your Housekeeping? [place]    Patient Care Team: [name], MD as PCP - Sandria Senter, Rachelle Hora, DO as Consulting Physician (Neurology) Sherrill Raring, [facility] (Pharmacist)  Indicate any recent Medical Services you may have received from other than [facility] providers in the past year (date may be approximate).     Assessment:   This is a routine wellness examination for Stanisha.  Hearing/Vision screen Hearing Screening - Comments:: Denies hearing difficulties   Vision Screening - Comments:: Wears rx glasses - up to date with routine eye exams with  [facility]  Dietary issues and exercise activities discussed: Exercise limited by: orthopedic condition(s)   Goals Addressed               This Visit's Progress     Stay Alive (pt-stated)         Depression Screen    03/19/2023    3:07 PM 10/23/2022   11:51 AM 07/20/2022    2:34 PM 03/02/2022    5:34 PM 02/05/2022    8:51 AM 11/14/2021    3:38 PM 11/05/2021    8:46 AM  PHQ 2/9 Scores  PHQ - 2 Score 0 1 1 1 2  0 0  PHQ- 9 Score  14 13 13 18  0 15    Fall Risk    03/19/2023    3:09 PM 11/21/2022    7:38 PM  07/20/2022    2:33 PM 03/02/2022    5:34 PM 02/23/2022   10:37 PM  Fall Risk   Falls in the past year? 1 1 1 1 1   Number falls in past yr: 0 1 1 1 1   Injury with Fall? 1 1 1 1 1   Comment Fx vertebrae. Followed by medical attention.  back    Risk for fall due to : Orthopedic patient  History of fall(s);Impaired balance/gait Impaired balance/gait;Impaired mobility;History of fall(s)   Follow up Falls prevention discussed  Falls evaluation completed Falls evaluation completed;Falls prevention discussed     FALL RISK PREVENTION PERTAINING TO THE HOME:  Any stairs in or around the home? Yes  If so, are there any without handrails? No  Home free of loose throw rugs in walkways, pet beds, electrical cords, etc? Yes  Adequate lighting in your home to reduce risk of falls? Yes   ASSISTIVE DEVICES UTILIZED TO PREVENT FALLS:  Life alert? No  Use of a cane, walker or w/c? Yes  Grab bars in the bathroom? Yes  Shower chair or bench in shower? Yes  Elevated toilet seat or a handicapped toilet? Yes  TIMED UP AND GO:  Was the test performed? No . Audio Visit   Cognitive Function:        03/19/2023    3:11 PM 11/14/2021    3:48 PM 10/25/2019   10:35 AM  6CIT Screen  What Year? 0 points 0 points 0 points  What month? 0 points 0 points 0 points  What time? 0 points 0 points 0 points  Count back from 20 0 points 0 points 0 points  Months in reverse 0 points 0 points 0 points  Repeat phrase 0 points 0 points 4 points  Total Score 0 points 0 points 4 points    Immunizations Immunization History  Administered Date(s) Administered   Covid-19, Mrna,Vaccine(Spikevax)39yrs and older 09/21/2022   Fluad Quad(high Dose 65+) 12/23/2020, 10/16/2021, 07/20/2022   Influenza Split 08/02/2013   Influenza, High Dose Seasonal PF 07/30/2015, 07/20/2017, 08/23/2018, 08/10/2019   Influenza-Unspecified 08/08/2014, 07/27/2016   MODERNA COVID-19 SARS-COV-2 PEDS BIVALENT BOOSTER 6Y-11Y 01/21/2021   Moderna  Sars-Covid-2 Vaccination 02/12/2020, 03/11/2020   Pneumococcal Conjugate-13 12/25/2015   Pneumococcal Polysaccharide-23 08/23/2018    TDAP status: Due, Education has been provided regarding the importance of this vaccine. Advised may receive this vaccine at local pharmacy or Health Dept. Aware to provide a copy of the vaccination record if obtained from local pharmacy or Health Dept. Verbalized acceptance and understanding.  Flu Vaccine status: Up to date                               ` Pneumococcal vaccine status: Up to date  Covid-19 vaccine status: Completed vaccines  Qualifies for Shingles Vaccine? Yes   Zostavax completed No   Shingrix Completed?: No.    Education has been provided regarding the importance of this vaccine. Patient has been advised to call insurance company to determine out of pocket expense if they have not yet received this vaccine. Advised may also receive vaccine at local pharmacy or Health Dept. Verbalized acceptance and understanding.  Screening Tests Health Maintenance  Topic Date Due   DTaP/Tdap/Td (1 - Tdap) Never done   COVID-19 Vaccine (5 - 2023-24 season) 04/04/2023 (Originally 11/16/2022)   Zoster Vaccines- Shingrix (1 of 2) 06/19/2023 (Originally [birth year]/1967)   COLONOSCOPY (Pts 45-39yrs Insurance coverage will need to be confirmed)  03/18/2024 (Originally 08/03/1993)   Hepatitis C Screening  03/18/2024 (Originally 08/03/1966)   INFLUENZA VACCINE  06/10/2023   Medicare Annual Wellness (AWV)  03/18/2024   MAMMOGRAM  01/27/2025   Pneumonia Vaccine 28+ Years old  Completed   DEXA SCAN  Completed   HPV VACCINES  Aged Out    Health Maintenance  Health Maintenance Due  Topic Date Due   DTaP/Tdap/Td (1 - Tdap) Never done    Colorectal cancer screening: Referral to GI placed Deferred. Pt aware the office will call re: appt.  Mammogram status: Completed 01/28/23. Repeat every year  Bone Density status: Completed 02/01/18. Results reflect: Bone density  results: OSTEOPOROSIS. Repeat every   years.  Lung Cancer Screening: (Low Dose CT Chest recommended if Age 29-80 years, 30 pack-year currently smoking OR have quit w/in 15years.) does not qualify.     Additional Screening:  Hepatitis C Screening: does qualify; Deferred  Vision Screening: Recommended annual ophthalmology exams for early detection of glaucoma and other disorders of the eye. Is the patient up to date with their annual eye exam?  Yes  Who is the provider or what is the name of  the office in which the patient attends annual eye exams? [facility] If pt is not established with a provider, would they like to be referred to a provider to establish care? No .   Dental Screening: Recommended annual dental exams for proper oral hygiene  Community Resource Referral / Chronic Care Management:  CRR required this visit?  No   CCM required this visit?  No      Plan:     I have personally reviewed and noted the following in the patient's chart:   Medical and social history Use of alcohol, tobacco or illicit drugs  Current medications and supplements including opioid prescriptions. Patient is not currently taking opioid prescriptions. Functional ability and status Nutritional status Physical activity Advanced directives List of other physicians Hospitalizations, surgeries, and ER visits in previous 12 months Vitals Screenings to include cognitive, depression, and falls Referrals and appointments  In addition, I have reviewed and discussed with patient certain preventive protocols, quality metrics, and best practice recommendations. A written personalized care plan for preventive services as well as general preventive health recommendations were provided to patient.     [name], LPN   1/61/0960   Nurse Notes: Patient due Hep-C Screening

## 2023-04-08 ENCOUNTER — Telehealth: Payer: Self-pay | Admitting: Family Medicine

## 2023-04-08 NOTE — Telephone Encounter (Signed)
Prescription Request  04/08/2023  LOV: 07/20/2022  What is the name of the medication or equipment? traMADol (ULTRAM) 50 MG tablet   Have you contacted your pharmacy to request a refill? No   Which pharmacy would you like this sent to?  [facility] - [place], [place] - [address] [place] [address] Phone: [phone] Fax: [phone]    Patient notified that their request is being sent to the clinical staff for review and that they should receive a response within 2 business days.   Please advise at Mobile [phone] (mobile)

## 2023-04-13 ENCOUNTER — Other Ambulatory Visit: Payer: Self-pay | Admitting: Family Medicine

## 2023-04-13 MED ORDER — TRAMADOL HCL 50 MG PO TABS: ORAL_TABLET | ORAL | 1 refills | Status: DC

## 2023-04-13 NOTE — Addendum Note (Signed)
Addended by: Gershon Crane A on: 04/13/2023 07:47 AM   Modules accepted: Orders

## 2023-04-13 NOTE — Telephone Encounter (Signed)
Done

## 2023-04-14 NOTE — Telephone Encounter (Signed)
[facility] - [place], [place] - [address] Phone: [phone]  Fax: [phone]     Re:  Clarification on dosage & quantity and possible drug interaction  Misty Stanley called, requesting a call back at your earliest convenience [phone] Ref:  [id number]  Open 9 am - 4:30 pm [place]

## 2023-04-15 NOTE — Telephone Encounter (Signed)
Attempted to call [facility] back regarding clarification for Rx Tramadol with no success. Will try again later

## 2023-04-26 DIAGNOSIS — M47816 Spondylosis without myelopathy or radiculopathy, lumbar region: Secondary | ICD-10-CM | POA: Diagnosis not present

## 2023-04-26 DIAGNOSIS — S22080S Wedge compression fracture of T11-T12 vertebra, sequela: Secondary | ICD-10-CM | POA: Diagnosis not present

## 2023-04-28 ENCOUNTER — Encounter: Payer: Self-pay | Admitting: Family Medicine

## 2023-04-28 NOTE — Telephone Encounter (Signed)
Error/njr °

## 2023-05-10 ENCOUNTER — Telehealth: Payer: Self-pay | Admitting: Family Medicine

## 2023-05-10 NOTE — Telephone Encounter (Signed)
Prescription Request  05/10/2023  LOV: 07/20/2022  What is the name of the medication or equipment? traMADol (ULTRAM) 50 MG tablet   *Pt says the pharmacy requested additional info from provider and did not receive a response and they canceled the prescription. Requesting new prescription be sent to pharmacy  Have you contacted your pharmacy to request a refill? Yes   Which pharmacy would you like this sent to?  [facility] - [place], [place] - [address] [place] [address] Phone: [phone] Fax: [phone]  Patient notified that their request is being sent to the clinical staff for review and that they should receive a response within 2 business days.   Please advise at Mobile [phone] (mobile)

## 2023-05-14 MED ORDER — TRAMADOL HCL 50 MG PO TABS: ORAL_TABLET | ORAL | 1 refills | Status: AC

## 2023-05-14 NOTE — Telephone Encounter (Signed)
Done

## 2023-05-14 NOTE — Telephone Encounter (Signed)
Pt notified via My Chart

## 2023-05-14 NOTE — Addendum Note (Signed)
Addended by: Gershon Crane A on: 05/14/2023 08:34 AM   Modules accepted: Orders

## 2023-05-17 ENCOUNTER — Other Ambulatory Visit: Payer: Self-pay | Admitting: Family Medicine

## 2023-05-21 DIAGNOSIS — S22089A Unspecified fracture of T11-T12 vertebra, initial encounter for closed fracture: Secondary | ICD-10-CM | POA: Diagnosis not present

## 2023-05-21 DIAGNOSIS — M47816 Spondylosis without myelopathy or radiculopathy, lumbar region: Secondary | ICD-10-CM | POA: Diagnosis not present

## 2023-05-21 DIAGNOSIS — M47814 Spondylosis without myelopathy or radiculopathy, thoracic region: Secondary | ICD-10-CM | POA: Diagnosis not present

## 2023-05-21 DIAGNOSIS — M4804 Spinal stenosis, thoracic region: Secondary | ICD-10-CM | POA: Diagnosis not present

## 2023-05-21 DIAGNOSIS — R29890 Loss of height: Secondary | ICD-10-CM | POA: Diagnosis not present

## 2023-06-03 DIAGNOSIS — Z008 Encounter for other general examination: Secondary | ICD-10-CM | POA: Diagnosis not present

## 2023-06-03 DIAGNOSIS — M47816 Spondylosis without myelopathy or radiculopathy, lumbar region: Secondary | ICD-10-CM | POA: Diagnosis not present

## 2023-06-03 DIAGNOSIS — F319 Bipolar disorder, unspecified: Secondary | ICD-10-CM | POA: Diagnosis not present

## 2023-06-10 DIAGNOSIS — G894 Chronic pain syndrome: Secondary | ICD-10-CM | POA: Diagnosis not present

## 2023-06-10 DIAGNOSIS — M47816 Spondylosis without myelopathy or radiculopathy, lumbar region: Secondary | ICD-10-CM | POA: Diagnosis not present

## 2023-06-23 DIAGNOSIS — Z4542 Encounter for adjustment and management of neuropacemaker (brain) (peripheral nerve) (spinal cord): Secondary | ICD-10-CM | POA: Diagnosis not present

## 2023-06-23 DIAGNOSIS — M47816 Spondylosis without myelopathy or radiculopathy, lumbar region: Secondary | ICD-10-CM | POA: Diagnosis not present

## 2023-06-29 ENCOUNTER — Telehealth: Payer: Self-pay

## 2023-06-29 NOTE — Progress Notes (Signed)
   06/29/2023  Patient ID: Audrey Brown, female   DOB: 12-Jun-1946, 77 y.o.   MRN: [id number]  Received a message from Dr. Claris Che office stating the patient had called to see about rescheduling an appointment she previously had scheduled with Marylene Land from [facility].  Tried to contact her to schedule a telephone visit but was unable to reach her.  I left a voicemail with my direct phone number and will try to call again next week if I do not hear back.  Lenna Gilford, PharmD, DPLA

## 2023-06-30 DIAGNOSIS — M47816 Spondylosis without myelopathy or radiculopathy, lumbar region: Secondary | ICD-10-CM | POA: Diagnosis not present

## 2023-06-30 DIAGNOSIS — G894 Chronic pain syndrome: Secondary | ICD-10-CM | POA: Diagnosis not present

## 2023-06-30 DIAGNOSIS — M47817 Spondylosis without myelopathy or radiculopathy, lumbosacral region: Secondary | ICD-10-CM | POA: Diagnosis not present

## 2023-07-07 ENCOUNTER — Other Ambulatory Visit: Payer: Medicare Other

## 2023-07-07 DIAGNOSIS — I10 Essential (primary) hypertension: Secondary | ICD-10-CM

## 2023-07-07 MED ORDER — LOSARTAN POTASSIUM 100 MG PO TABS: 100.0000 mg | ORAL_TABLET | Freq: Every day | ORAL | 3 refills | Status: DC

## 2023-07-07 NOTE — Progress Notes (Signed)
07/07/2023 Name: Audrey Brown MRN: [id number] DOB: February 18, 1946  Chief Complaint  Patient presents with   Medication Management   Audrey Brown is a 77 y.o. year old female who presented for a telephone visit to establish care with [facility] Pharm[facility] Subjective:  Care Team: Primary Care Provider: [name], MD   Medication Access/Adherence  Current Pharmacy:  [facility] - [address] [place] - [address] [place] [address] Phone: [phone] Fax: [phone]  [id number] - [place], [place] - [address] AT [facility] OF [address] & [facility] [address] [place] [address] Phone: [phone] Fax: [phone]  Patient reports affordability concerns with their medications: No  Patient reports access/transportation concerns to their pharmacy: No  Patient reports adherence concerns with their medications:  No    Hypertension: Current medications: losartan 50mg  daily  -Medications previously tried: lisinopril caused cough -Patient has a validated, automated, upper arm home BP cuff -Current blood pressure readings readings: averaging 155/75 -Patient denies hypotensive s/sx including dizziness, lightheadedness.  -Patient denies hypertensive symptoms including headache, chest pain, shortness of breath  Objective:  Lab Results  Component Value Date   CREATININE 0.73 03/25/2022   BUN 7 (L) 03/25/2022   NA 142 03/25/2022   K 3.6 03/25/2022   CL 110 03/25/2022   CO2 27 03/25/2022   Lab Results  Component Value Date   CHOL 225 (H) 12/25/2015   HDL 59.50 12/25/2015   LDLCALC 147 (H) 12/25/2015   TRIG 91.0 12/25/2015   CHOLHDL 4 12/25/2015   Medications Reviewed Today     Reviewed by Lenna Gilford, RPH (Pharmacist) on 07/07/23 at [id number]  Med List Status: <None>   Medication Order Taking? Sig Documenting Provider Last Dose Status Informant  albuterol (VENTOLIN HFA) 108 (90 Base) MCG/ACT  inhaler [id number] Yes Inhale 2 puffs into the lungs every 4 (four) hours as needed for wheezing or shortness of breath. [name], MD Taking Active   ALPRAZolam Prudy Feeler) 0.25 MG tablet [id number] Yes TAKE 1 TABLET THREE TIMES A DAY AS NEEDED FOR ANXIETY [name], MD Taking Active   AMBULATORY NON FORMULARY MEDICATION [id number]  Take 10 mg by mouth 3 (three) times daily. Medication Name: Domperidone 10 mg Esterwood, Amy S, PA-C  Active   buPROPion (WELLBUTRIN XL) 300 MG 24 hr tablet [id number] Yes Take 300 mg by mouth daily.  [provider] Taking Active Care Giver           Med Note Fabio Pierce Apr 06, 2016  1:26 PM)    busPIRone (BUSPAR) 10 MG tablet [id number] Yes Take 10 mg by mouth 2 (two) times daily.  [provider] Taking Active Care Giver  Cholecalciferol (VITAMIN D) 50 MCG (2000 UT) CAPS [id number] Yes Take 2,000 Units by mouth daily. [provider] Taking Active Self  cyanocobalamin (VITAMIN B12) 1000 MCG tablet [id number] Yes Take 1,000 mcg by mouth daily. [provider] Taking Active Self  esomeprazole (NEXIUM) 40 MG capsule [id number] Yes Take 1 capsule (40 mg total) by mouth 2 (two) times daily before a meal. Esterwood, Amy S, PA-C Taking Active   ibuprofen (ADVIL) 800 MG tablet [id number] Yes TAKE 1 TABLET EVERY 8 HOURS AS NEEDED FOR PAIN [name], MD Taking Active   LamoTRIgine XR (LAMICTAL XR) 200 MG TB24 [id number] Yes Take 1 tablet (200 mg total) by mouth daily. [name], MD  Taking Active Care Giver  losartan (COZAAR) 50 MG tablet [id number] Yes TAKE 1 TABLET DAILY [name], MD Taking Active   Magnesium 500 MG CAPS [id number] Yes Take 400 mg by mouth at bedtime.  [provider] Taking Active Care Giver  Melatonin 5 MG CAPS [id number] Yes Take 5 mg by mouth at bedtime. [provider] Taking Active Care Giver  METHOCARBAMOL PO [id number] Yes Take 500 mg by mouth 2 (two) times daily as needed  (pain). [provider] Taking Active Self  OSCIMIN 0.125 MG SUBL [id number] Yes PLACE 1 TABLET UNDER THE TONGUE EVERY 6 HOURS AS NEEDED FOR CRAMPING  Patient taking differently: Place 0.125 mg under the tongue every 6 (six) hours as needed (cramping).   Beverley Fiedler, MD Taking Active   oxybutynin (DITROPAN-XL) 10 MG 24 hr tablet [id number] Yes TAKE 1 TABLET AT BEDTIME [name], MD Taking Active   oxyCODONE-acetaminophen (PERCOCET/ROXICET) 5-325 MG tablet [id number]  Take 1 tablet by mouth every 4 (four) hours as needed for severe pain. [provider]  Active Self  promethazine (PHENERGAN) 25 MG tablet [id number] Yes TAKE 1 TABLET EVERY 4 HOURS AS NEEDED FOR NAUSEA [name], MD Taking Active   propranolol (INDERAL) 10 MG tablet [id number] Yes Take 10 mg by mouth 2 (two) times daily. [provider] Taking Active   Teriparatide, Recombinant, (FORTEO) 600 MCG/2.4ML SOPN [id number] Yes Inject 2.4 mLs into the skin daily. [provider] Taking Active Self  traMADol (ULTRAM) 50 MG tablet [id number] Yes TAKE 2 TABLETS EVERY 6 HOURS AS NEEDED FOR MODERATE PAIN [name], MD Taking Active   traZODone (DESYREL) 50 MG tablet [id number] Yes Take 50 mg by mouth at bedtime. [provider] Taking Active Self           Assessment/Plan:   Hypertension: - Currently uncontrolled - Recommend increasing losartan to 100mg  daily based on reported home readings and most recent OV values - Patient is also due for CMP, CBS, lipid panel.  If losartan increased, I recommend f/u with PCP for fasting labs in 4-6 weeks  Follow Up Plan: If losartan dose increased, I will communicate with patient and schedule f/u to check on home BP after dose is started  Lenna Gilford, PharmD, DPLA

## 2023-07-09 DIAGNOSIS — M810 Age-related osteoporosis without current pathological fracture: Secondary | ICD-10-CM | POA: Diagnosis not present

## 2023-07-18 ENCOUNTER — Other Ambulatory Visit: Payer: Self-pay

## 2023-07-18 DIAGNOSIS — R142 Eructation: Secondary | ICD-10-CM | POA: Diagnosis not present

## 2023-07-18 DIAGNOSIS — I200 Unstable angina: Secondary | ICD-10-CM | POA: Diagnosis not present

## 2023-07-18 DIAGNOSIS — I10 Essential (primary) hypertension: Secondary | ICD-10-CM | POA: Diagnosis not present

## 2023-07-18 DIAGNOSIS — I499 Cardiac arrhythmia, unspecified: Secondary | ICD-10-CM | POA: Diagnosis not present

## 2023-07-18 DIAGNOSIS — R079 Chest pain, unspecified: Secondary | ICD-10-CM | POA: Diagnosis not present

## 2023-07-19 ENCOUNTER — Encounter (HOSPITAL_COMMUNITY)
Admission: EM | Disposition: A | Discharge status: Home / Self Care | Payer: Self-pay | Source: Home / Self Care | Attending: Emergency Medicine

## 2023-07-19 ENCOUNTER — Emergency Department (HOSPITAL_COMMUNITY): Payer: Medicare Other

## 2023-07-19 ENCOUNTER — Observation Stay (HOSPITAL_COMMUNITY)
Admission: EM | Admit: 2023-07-19 | Discharge: 2023-07-19 | Disposition: A | Discharge status: Home / Self Care | Payer: Medicare Other | Attending: Cardiovascular Disease | Admitting: Cardiovascular Disease

## 2023-07-19 ENCOUNTER — Inpatient Hospital Stay (HOSPITAL_COMMUNITY): Payer: Medicare Other

## 2023-07-19 ENCOUNTER — Other Ambulatory Visit: Payer: Self-pay

## 2023-07-19 DIAGNOSIS — K8689 Other specified diseases of pancreas: Secondary | ICD-10-CM | POA: Diagnosis not present

## 2023-07-19 DIAGNOSIS — R079 Chest pain, unspecified: Secondary | ICD-10-CM | POA: Diagnosis not present

## 2023-07-19 DIAGNOSIS — J90 Pleural effusion, not elsewhere classified: Secondary | ICD-10-CM | POA: Diagnosis not present

## 2023-07-19 DIAGNOSIS — I10 Essential (primary) hypertension: Secondary | ICD-10-CM | POA: Insufficient documentation

## 2023-07-19 DIAGNOSIS — I200 Unstable angina: Secondary | ICD-10-CM | POA: Insufficient documentation

## 2023-07-19 DIAGNOSIS — K5750 Diverticulosis of both small and large intestine without perforation or abscess without bleeding: Secondary | ICD-10-CM | POA: Diagnosis not present

## 2023-07-19 DIAGNOSIS — I701 Atherosclerosis of renal artery: Secondary | ICD-10-CM | POA: Diagnosis not present

## 2023-07-19 DIAGNOSIS — F3160 Bipolar disorder, current episode mixed, unspecified: Secondary | ICD-10-CM | POA: Diagnosis present

## 2023-07-19 DIAGNOSIS — F0390 Unspecified dementia without behavioral disturbance: Secondary | ICD-10-CM | POA: Insufficient documentation

## 2023-07-19 DIAGNOSIS — D259 Leiomyoma of uterus, unspecified: Secondary | ICD-10-CM | POA: Diagnosis not present

## 2023-07-19 DIAGNOSIS — Z79899 Other long term (current) drug therapy: Secondary | ICD-10-CM | POA: Diagnosis not present

## 2023-07-19 DIAGNOSIS — I214 Non-ST elevation (NSTEMI) myocardial infarction: Secondary | ICD-10-CM | POA: Diagnosis not present

## 2023-07-19 DIAGNOSIS — S32019A Unspecified fracture of first lumbar vertebra, initial encounter for closed fracture: Secondary | ICD-10-CM | POA: Diagnosis not present

## 2023-07-19 DIAGNOSIS — G20C Parkinsonism, unspecified: Secondary | ICD-10-CM | POA: Insufficient documentation

## 2023-07-19 DIAGNOSIS — J9811 Atelectasis: Secondary | ICD-10-CM | POA: Diagnosis not present

## 2023-07-19 HISTORY — PX: LEFT HEART CATH AND CORONARY ANGIOGRAPHY: CATH118249

## 2023-07-19 LAB — CBC WITH DIFFERENTIAL/PLATELET
Abs Immature Granulocytes: 0.03 10*3/uL (ref 0.00–0.07)
Basophils Absolute: 0.1 10*3/uL (ref 0.0–0.1)
Basophils Relative: 1 %
Eosinophils Absolute: 0.3 10*3/uL (ref 0.0–0.5)
Eosinophils Relative: 3 %
HCT: 39.2 % (ref 36.0–46.0)
Hemoglobin: 13 g/dL (ref 12.0–15.0)
Immature Granulocytes: 0 %
Lymphocytes Relative: 19 %
Lymphs Abs: 1.9 10*3/uL (ref 0.7–4.0)
MCH: 29.7 pg (ref 26.0–34.0)
MCHC: 33.2 g/dL (ref 30.0–36.0)
MCV: 89.5 fL (ref 80.0–100.0)
Monocytes Absolute: 0.9 10*3/uL (ref 0.1–1.0)
Monocytes Relative: 9 %
Neutro Abs: 6.8 10*3/uL (ref 1.7–7.7)
Neutrophils Relative %: 68 %
Platelets: 330 10*3/uL (ref 150–400)
RBC: 4.38 MIL/uL (ref 3.87–5.11)
RDW: 12.7 % (ref 11.5–15.5)
WBC: 10 10*3/uL (ref 4.0–10.5)
nRBC: 0 % (ref 0.0–0.2)

## 2023-07-19 LAB — URINALYSIS, ROUTINE W REFLEX MICROSCOPIC
Bacteria, UA: NONE SEEN
Bilirubin Urine: NEGATIVE
Glucose, UA: NEGATIVE mg/dL
Hgb urine dipstick: NEGATIVE
Ketones, ur: NEGATIVE mg/dL
Nitrite: NEGATIVE
Protein, ur: NEGATIVE mg/dL
Specific Gravity, Urine: >1.046 — ABNORMAL HIGH (ref 1.005–1.030)
pH: 5 (ref 5.0–8.0)

## 2023-07-19 LAB — COMPREHENSIVE METABOLIC PANEL
ALT: 15 U/L (ref 0–44)
AST: 21 U/L (ref 15–41)
Albumin: 3.7 g/dL (ref 3.5–5.0)
Alkaline Phosphatase: 62 U/L (ref 38–126)
Anion gap: 11 (ref 5–15)
BUN: 20 mg/dL (ref 8–23)
CO2: 22 mmol/L (ref 22–32)
Calcium: 9 mg/dL (ref 8.9–10.3)
Chloride: 102 mmol/L (ref 98–111)
Creatinine, Ser: 1.08 mg/dL — ABNORMAL HIGH (ref 0.44–1.00)
GFR, Estimated: 54 mL/min — ABNORMAL LOW (ref >60)
Glucose, Bld: 110 mg/dL — ABNORMAL HIGH (ref 70–99)
Potassium: 4.4 mmol/L (ref 3.5–5.1)
Sodium: 135 mmol/L (ref 135–145)
Total Bilirubin: 0.5 mg/dL (ref 0.3–1.2)
Total Protein: 6.9 g/dL (ref 6.5–8.1)

## 2023-07-19 LAB — LIPASE, BLOOD: Lipase: 23 U/L (ref 11–51)

## 2023-07-19 LAB — TROPONIN I (HIGH SENSITIVITY)
Troponin I (High Sensitivity): 9 ng/L (ref <18)
Troponin I (High Sensitivity): 18 ng/L — ABNORMAL HIGH (ref <18)
Troponin I (High Sensitivity): 35 ng/L — ABNORMAL HIGH (ref <18)

## 2023-07-19 LAB — MRSA NEXT GEN BY PCR, NASAL: MRSA by PCR Next Gen: NOT DETECTED

## 2023-07-19 SURGERY — LEFT HEART CATH AND CORONARY ANGIOGRAPHY
Anesthesia: LOCAL

## 2023-07-19 MED ORDER — VITAMIN D 25 MCG (1000 UNIT) PO TABS
2000.0000 [IU] | ORAL_TABLET | Freq: Every day | ORAL | Status: DC
  Administered 2023-07-19: 2000 [IU] via ORAL
  Filled 2023-07-19: qty 2

## 2023-07-19 MED ORDER — ACETAMINOPHEN 325 MG PO TABS
650.0000 mg | ORAL_TABLET | ORAL | Status: DC | PRN
  Administered 2023-07-19: 650 mg via ORAL
  Filled 2023-07-19: qty 2

## 2023-07-19 MED ORDER — SODIUM CHLORIDE 0.9% FLUSH: 3.0000 mL | Freq: Two times a day (BID) | INTRAVENOUS | Status: DC

## 2023-07-19 MED ORDER — HEPARIN BOLUS VIA INFUSION
3000.0000 [IU] | Freq: Once | INTRAVENOUS | Status: AC
  Administered 2023-07-19: 3000 [IU] via INTRAVENOUS
  Filled 2023-07-19: qty 3000

## 2023-07-19 MED ORDER — SODIUM CHLORIDE 0.9 % IV SOLN: INTRAVENOUS | Status: AC

## 2023-07-19 MED ORDER — TERIPARATIDE 600 MCG/2.4ML ~~LOC~~ SOPN: 2.4000 mL | PEN_INJECTOR | Freq: Every day | SUBCUTANEOUS | Status: DC

## 2023-07-19 MED ORDER — IOHEXOL 350 MG/ML SOLN
100.0000 mL | Freq: Once | INTRAVENOUS | Status: AC | PRN
  Administered 2023-07-19: 100 mL via INTRAVENOUS

## 2023-07-19 MED ORDER — LIDOCAINE HCL (PF) 1 % IJ SOLN
INTRAMUSCULAR | Status: DC | PRN
  Administered 2023-07-19: 2 mL

## 2023-07-19 MED ORDER — LABETALOL HCL 5 MG/ML IV SOLN: 10.0000 mg | INTRAVENOUS | Status: DC | PRN

## 2023-07-19 MED ORDER — SODIUM CHLORIDE 0.9 % WEIGHT BASED INFUSION: 1.0000 mL/kg/h | INTRAVENOUS | Status: DC

## 2023-07-19 MED ORDER — MELATONIN 5 MG PO TABS: 5.0000 mg | ORAL_TABLET | Freq: Every day | ORAL | Status: DC

## 2023-07-19 MED ORDER — ASPIRIN 81 MG PO TBEC: 81.0000 mg | DELAYED_RELEASE_TABLET | Freq: Every day | ORAL | Status: DC

## 2023-07-19 MED ORDER — FENTANYL CITRATE (PF) 100 MCG/2ML IJ SOLN
INTRAMUSCULAR | Status: DC | PRN
  Administered 2023-07-19: 25 ug via INTRAVENOUS

## 2023-07-19 MED ORDER — ONDANSETRON HCL 4 MG/2ML IJ SOLN
4.0000 mg | Freq: Once | INTRAMUSCULAR | Status: AC
  Administered 2023-07-19: 4 mg via INTRAVENOUS
  Filled 2023-07-19: qty 2

## 2023-07-19 MED ORDER — LAMOTRIGINE ER 100 MG PO TB24
200.0000 mg | ORAL_TABLET | Freq: Every day | ORAL | Status: DC
  Administered 2023-07-19: 200 mg via ORAL
  Filled 2023-07-19 (×2): qty 2

## 2023-07-19 MED ORDER — BUSPIRONE HCL 10 MG PO TABS
10.0000 mg | ORAL_TABLET | Freq: Two times a day (BID) | ORAL | Status: DC
  Administered 2023-07-19: 10 mg via ORAL
  Filled 2023-07-19: qty 1

## 2023-07-19 MED ORDER — LOSARTAN POTASSIUM 50 MG PO TABS
100.0000 mg | ORAL_TABLET | Freq: Every day | ORAL | Status: DC
  Administered 2023-07-19: 100 mg via ORAL
  Filled 2023-07-19: qty 2

## 2023-07-19 MED ORDER — MIDAZOLAM HCL 2 MG/2ML IJ SOLN
INTRAMUSCULAR | Status: DC | PRN
  Administered 2023-07-19: 1 mg via INTRAVENOUS

## 2023-07-19 MED ORDER — VERAPAMIL HCL 2.5 MG/ML IV SOLN
INTRAVENOUS | Status: AC
  Filled 2023-07-19: qty 2

## 2023-07-19 MED ORDER — HYDRALAZINE HCL 20 MG/ML IJ SOLN: 10.0000 mg | INTRAMUSCULAR | Status: DC | PRN

## 2023-07-19 MED ORDER — VITAMIN B-12 1000 MCG PO TABS
1000.0000 ug | ORAL_TABLET | Freq: Every day | ORAL | Status: DC
  Administered 2023-07-19: 1000 ug via ORAL
  Filled 2023-07-19: qty 1

## 2023-07-19 MED ORDER — LIDOCAINE VISCOUS HCL 2 % MT SOLN
15.0000 mL | Freq: Once | OROMUCOSAL | Status: AC
  Administered 2023-07-19: 15 mL via OROMUCOSAL
  Filled 2023-07-19: qty 15

## 2023-07-19 MED ORDER — ONDANSETRON HCL 4 MG/2ML IJ SOLN: 4.0000 mg | Freq: Four times a day (QID) | INTRAMUSCULAR | Status: DC | PRN

## 2023-07-19 MED ORDER — PANTOPRAZOLE SODIUM 40 MG PO TBEC
40.0000 mg | DELAYED_RELEASE_TABLET | Freq: Every day | ORAL | Status: DC
  Administered 2023-07-19: 40 mg via ORAL
  Filled 2023-07-19: qty 1

## 2023-07-19 MED ORDER — OXYBUTYNIN CHLORIDE ER 10 MG PO TB24
10.0000 mg | ORAL_TABLET | Freq: Every day | ORAL | Status: DC
  Filled 2023-07-19: qty 1

## 2023-07-19 MED ORDER — ASPIRIN 81 MG PO TBEC: 81.0000 mg | DELAYED_RELEASE_TABLET | Freq: Every day | ORAL | Status: AC

## 2023-07-19 MED ORDER — FENTANYL CITRATE PF 50 MCG/ML IJ SOSY
100.0000 ug | PREFILLED_SYRINGE | Freq: Once | INTRAMUSCULAR | Status: AC
  Administered 2023-07-19: 100 ug via INTRAVENOUS
  Filled 2023-07-19: qty 2

## 2023-07-19 MED ORDER — SODIUM CHLORIDE 0.9 % WEIGHT BASED INFUSION
3.0000 mL/kg/h | INTRAVENOUS | Status: DC
  Administered 2023-07-19: 3 mL/kg/h via INTRAVENOUS

## 2023-07-19 MED ORDER — ASPIRIN 81 MG PO CHEW
324.0000 mg | CHEWABLE_TABLET | Freq: Once | ORAL | Status: AC
  Administered 2023-07-19: 324 mg via ORAL
  Filled 2023-07-19: qty 4

## 2023-07-19 MED ORDER — NITROGLYCERIN 0.4 MG SL SUBL
0.4000 mg | SUBLINGUAL_TABLET | SUBLINGUAL | Status: DC | PRN
  Administered 2023-07-19: 0.4 mg via SUBLINGUAL
  Filled 2023-07-19: qty 1

## 2023-07-19 MED ORDER — BUPROPION HCL ER (XL) 150 MG PO TB24
300.0000 mg | ORAL_TABLET | Freq: Every day | ORAL | Status: DC
  Administered 2023-07-19: 300 mg via ORAL
  Filled 2023-07-19: qty 2
  Filled 2023-07-19: qty 1

## 2023-07-19 MED ORDER — FENTANYL CITRATE PF 50 MCG/ML IJ SOSY
50.0000 ug | PREFILLED_SYRINGE | Freq: Once | INTRAMUSCULAR | Status: AC
  Administered 2023-07-19: 50 ug via INTRAVENOUS
  Filled 2023-07-19: qty 1

## 2023-07-19 MED ORDER — HEPARIN (PORCINE) 25000 UT/250ML-% IV SOLN
800.0000 [IU]/h | INTRAVENOUS | Status: DC
  Administered 2023-07-19: 800 [IU]/h via INTRAVENOUS
  Filled 2023-07-19: qty 250

## 2023-07-19 MED ORDER — HEPARIN SODIUM (PORCINE) 1000 UNIT/ML IJ SOLN
INTRAMUSCULAR | Status: AC
  Filled 2023-07-19: qty 10

## 2023-07-19 MED ORDER — HEPARIN SODIUM (PORCINE) 1000 UNIT/ML IJ SOLN
INTRAMUSCULAR | Status: DC | PRN
  Administered 2023-07-19: 3500 [IU] via INTRAVENOUS

## 2023-07-19 MED ORDER — LIDOCAINE HCL (PF) 1 % IJ SOLN
INTRAMUSCULAR | Status: AC
  Filled 2023-07-19: qty 30

## 2023-07-19 MED ORDER — EZETIMIBE 10 MG PO TABS
10.0000 mg | ORAL_TABLET | Freq: Every day | ORAL | Status: DC
  Administered 2023-07-19: 10 mg via ORAL
  Filled 2023-07-19: qty 1

## 2023-07-19 MED ORDER — ACETAMINOPHEN 325 MG PO TABS: 650.0000 mg | ORAL_TABLET | ORAL | Status: DC | PRN

## 2023-07-19 MED ORDER — SENNA 8.6 MG PO TABS: 1.0000 | ORAL_TABLET | Freq: Every day | ORAL | Status: DC

## 2023-07-19 MED ORDER — FENTANYL CITRATE PF 50 MCG/ML IJ SOSY: 50.0000 ug | PREFILLED_SYRINGE | Freq: Once | INTRAMUSCULAR | Status: DC

## 2023-07-19 MED ORDER — FENTANYL CITRATE (PF) 100 MCG/2ML IJ SOLN
INTRAMUSCULAR | Status: AC
  Filled 2023-07-19: qty 2

## 2023-07-19 MED ORDER — ALUM & MAG HYDROXIDE-SIMETH 200-200-20 MG/5ML PO SUSP
30.0000 mL | Freq: Once | ORAL | Status: AC
  Administered 2023-07-19: 30 mL via ORAL
  Filled 2023-07-19: qty 30

## 2023-07-19 MED ORDER — MIDAZOLAM HCL 2 MG/2ML IJ SOLN
INTRAMUSCULAR | Status: AC
  Filled 2023-07-19: qty 2

## 2023-07-19 MED ORDER — VERAPAMIL HCL 2.5 MG/ML IV SOLN
INTRAVENOUS | Status: DC | PRN
  Administered 2023-07-19 (×2): 10 mL via INTRA_ARTERIAL

## 2023-07-19 MED ORDER — IOHEXOL 350 MG/ML SOLN
INTRAVENOUS | Status: DC | PRN
  Administered 2023-07-19: 35 mL

## 2023-07-19 MED ORDER — SODIUM CHLORIDE 0.9 % IV SOLN: 250.0000 mL | INTRAVENOUS | Status: DC | PRN

## 2023-07-19 MED ORDER — SODIUM CHLORIDE 0.9% FLUSH: 3.0000 mL | INTRAVENOUS | Status: DC | PRN

## 2023-07-19 MED ORDER — HEPARIN (PORCINE) IN NACL 1000-0.9 UT/500ML-% IV SOLN
INTRAVENOUS | Status: DC | PRN
  Administered 2023-07-19 (×2): 500 mL

## 2023-07-19 MED ORDER — PROPRANOLOL HCL 20 MG PO TABS
20.0000 mg | ORAL_TABLET | Freq: Two times a day (BID) | ORAL | Status: DC
  Administered 2023-07-19: 20 mg via ORAL
  Filled 2023-07-19 (×2): qty 1

## 2023-07-19 SURGICAL SUPPLY — 9 items
CATH 5FR JL3.5 JR4 ANG PIG MP (CATHETERS) IMPLANT
DEVICE RAD COMP TR BAND LRG (VASCULAR PRODUCTS) IMPLANT
DEVICE RAD TR BAND REGULAR (VASCULAR PRODUCTS) IMPLANT
GLIDESHEATH SLEND SS 6F .021 (SHEATH) IMPLANT
GUIDEWIRE INQWIRE 1.5J.035X260 (WIRE) IMPLANT
INQWIRE 1.5J .035X260CM (WIRE) ×1
PACK CARDIAC CATHETERIZATION (CUSTOM PROCEDURE TRAY) ×1 IMPLANT
SET ATX-X65L (MISCELLANEOUS) IMPLANT
WIRE HI TORQ VERSACORE-J 145CM (WIRE) IMPLANT

## 2023-07-19 NOTE — Care Management CC44 (Signed)
Condition Code 44 Documentation Completed  Patient Details  Name: Audrey Brown MRN: [id number] Date of Birth: [birth year]   Condition Code 44 given:  Yes Patient signature on Condition Code 44 notice:  Yes Documentation of 2 MD's agreement:  Yes Code 44 added to claim:  Yes    Kingsley Plan, RN 07/19/2023, 4:40 PM

## 2023-07-19 NOTE — Progress Notes (Signed)
   07/19/23 1640  Obs Status Letter  Medicare Obs Status Letter given? Yes  Condition Code 44 Given: Yes  Patient signature on CC44 notice: Yes  Documentation of 2 MD's agreement CC44: Yes  Code 44 added to claim: Yes

## 2023-07-19 NOTE — ED Notes (Signed)
ED TO INPATIENT HANDOFF REPORT  ED Nurse Name and Phone #: [place] RN [phone]  S Name/Age/Gender Audrey Brown 77 y.o. female Room/Bed: 016C/016C  Code Status   Code Status: Full Code  Home/SNF/Other Home Patient oriented to: self, place, time, and situation Is this baseline? Yes   Triage Complete: Triage complete  Chief Complaint NSTEMI (non-ST elevated myocardial infarction) [facility]) [I21.4]  Triage Note Home since 6pm having chest discomfort. Having belching, gerd, pain with deep breathe, feels better sitting up. Hx of gerd but never had episode that felt like this. EKG clean per EMS. Aspirin given by EMS. Wheelchair bound at baseline- Park[place] pt   Allergies Allergies  Allergen Reactions   Celebrex [Celecoxib] Hives   Morphine And Codeine Hives   Penicillins Hives, Nausea Only and Other (See Comments)    Has patient had a PCN reaction causing immediate rash, facial/tongue/throat swelling, SOB or lightheadedness with hypotension: Yes Has patient had a PCN reaction causing severe rash involving mucus membranes or skin necrosis: Yes Has patient had a PCN reaction that required hospitalization: Yes, was already in the hospital  Has patient had a PCN reaction occurring within the last 10 years: No If all of the above answers are "NO", then may proceed with Cephalosporin use.    Risperdal [Risperidone] Other (See Comments)    Tremors and seizures   Statins Other (See Comments)    MUSCLE ACHES   Lisinopril Cough   Hydrocodone Hives    Pt state that she takes it with Benadryl    Level of Care/Admitting Diagnosis ED Disposition     ED Disposition  Admit   Condition  --   Comment  Hospital Area: [facility] [[id number]]  Level of Care: Telemetry Cardiac [103]  May admit patient to [facility] or [facility] if equivalent level of care is available:: Yes  Covid Evaluation: Asymptomatic - no recent exposure (last 10 days) testing not required  Diagnosis:  NSTEMI (non-ST elevated myocardial infarction) [facility]) [[id number]]  Admitting Physician: Freddy Finner [[id number]]  Attending Physician: Freddy Finner [[id number]]  Certification:: I certify this patient will need inpatient services for at least 2 midnights  Expected Medical Readiness: 07/19/2023          B Medical/Surgery History Past Medical History:  Diagnosis Date   Arthritis    lower back   Bipolar disorder (HCC)    Depression    GERD (gastroesophageal reflux disease)    Glaucoma    bilateral   Hiatal hernia    Loss of vision    right eye   Parkinson disease    Poor short-term memory    Sagittal band rupture at metacarpophalangeal joint 11/2016   right long finger   Past Surgical History:  Procedure Laterality Date   ANKLE SURGERY     BALLOON DILATION N/A 10/29/2017   Procedure: BALLOON DILATION;  Surgeon: Beverley Fiedler, MD;  Location: [facility];  Service: Gastroenterology;  Laterality: N/A;   CATARACT EXTRACTION Right 08/07/2020   CERVICAL DISC SURGERY  1994   CESAREAN SECTION     CHOLECYSTECTOMY     CYST REMOVAL NECK     ESOPHAGEAL DILATION  12/19/2018   Procedure: ESOPHAGEAL DILATION;  Surgeon: Beverley Fiedler, MD;  Location: [facility];  Service: Gastroenterology;;   ESOPHAGOGASTRODUODENOSCOPY (EGD) WITH ESOPHAGEAL DILATION  07/27/2012   with Propofol   ESOPHAGOGASTRODUODENOSCOPY (EGD) WITH PROPOFOL N/A 06/23/2016   Procedure: ESOPHAGOGASTRODUODENOSCOPY (EGD) WITH PROPOFOL;  Surgeon: Beverley Fiedler, MD;  Location: [facility];  Service: Gastroenterology;  Laterality: N/A;   ESOPHAGOGASTRODUODENOSCOPY (EGD) WITH PROPOFOL N/A 10/29/2017   Procedure: ESOPHAGOGASTRODUODENOSCOPY (EGD) WITH PROPOFOL;  Surgeon: Beverley Fiedler, MD;  Location: [facility];  Service: Gastroenterology;  Laterality: N/A;   ESOPHAGOGASTRODUODENOSCOPY (EGD) WITH PROPOFOL N/A 12/19/2018   Procedure: ESOPHAGOGASTRODUODENOSCOPY (EGD) WITH PROPOFOL;  Surgeon: Beverley Fiedler, MD;  Location: [facility];  Service: Gastroenterology;  Laterality: N/A;   EYE SURGERY     EYE SURGERY  09/2019   R eye procedure to relieve pressure   EYE SURGERY     FOOT NEUROMA SURGERY Left    ORIF DISTAL RADIUS FRACTURE Right 07/04/2010   SAVORY DILATION N/A 06/23/2016   Procedure: SAVORY DILATION;  Surgeon: Beverley Fiedler, MD;  Location: [facility];  Service: Gastroenterology;  Laterality: N/A;   TENDON REPAIR Right 12/01/2016   Procedure: right long finger ulnar sagittal band reconstruction;  Surgeon: Betha Loa, MD;  Location: [facility];  Service: Orthopedics;  Laterality: Right;   TONSILLECTOMY       A IV Location/Drains/Wounds Patient Lines/Drains/Airways Status     Active Line/Drains/Airways     Name Placement date Placement time Site Days   Peripheral IV 07/19/23 20 G 1" Anterior;Left Hand 07/19/23  0004  Hand  less than 1   Peripheral IV 07/19/23 18 G Anterior;Proximal;Right Forearm 07/19/23  0158  Forearm  less than 1            Intake/Output Last 24 hours No intake or output data in the 24 hours ending 07/19/23 0541  Labs/Imaging Results for orders placed or performed during the hospital encounter of 07/19/23 (from the past 48 hour(s))  Troponin I (High Sensitivity)     Status: None   Collection Time: 07/19/23 12:12 AM  Result Value Ref Range   Troponin I (High Sensitivity) 9 <18 ng/L    Comment: (NOTE) Elevated high sensitivity troponin I (hsTnI) values and significant  changes across serial measurements may suggest ACS but many other  chronic and acute conditions are known to elevate hsTnI results.  Refer to the "Links" section for chest pain algorithms and additional  guidance. Performed at [facility], [address] [place], [place] [address]   Comprehensive metabolic panel     Status: Abnormal   Collection Time: 07/19/23 12:12 AM  Result Value Ref Range   Sodium 135 135 - 145 mmol/L   Potassium 4.4 3.5 - 5.1 mmol/L   Chloride 102 98 -  111 mmol/L   CO2 22 22 - 32 mmol/L   Glucose, Bld 110 (H) 70 - 99 mg/dL    Comment: Glucose reference range applies only to samples taken after fasting for at least 8 hours.   BUN 20 8 - 23 mg/dL   Creatinine, Ser 7.84 (H) 0.44 - 1.00 mg/dL   Calcium 9.0 8.9 - 69.6 mg/dL   Total Protein 6.9 6.5 - 8.1 g/dL   Albumin 3.7 3.5 - 5.0 g/dL   AST 21 15 - 41 U/L   ALT 15 0 - 44 U/L   Alkaline Phosphatase 62 38 - 126 U/L   Total Bilirubin 0.5 0.3 - 1.2 mg/dL   GFR, Estimated 54 (L) >60 mL/min    Comment: (NOTE) Calculated using the CKD-EPI Creatinine Equation (2021)    Anion gap 11 5 - 15    Comment: Performed at [facility], [address] [place], [place] [address]  CBC with Differential     Status:  None   Collection Time: 07/19/23 12:12 AM  Result Value Ref Range   WBC 10.0 4.0 - 10.5 K/uL   RBC 4.38 3.87 - 5.11 MIL/uL   Hemoglobin 13.0 12.0 - 15.0 g/dL   HCT 44.0 34.7 - 42.5 %   MCV 89.5 80.0 - 100.0 fL   MCH 29.7 26.0 - 34.0 pg   MCHC 33.2 30.0 - 36.0 g/dL   RDW 95.6 38.7 - 56.4 %   Platelets 330 150 - 400 K/uL   nRBC 0.0 0.0 - 0.2 %   Neutrophils Relative % 68 %   Neutro Abs 6.8 1.7 - 7.7 K/uL   Lymphocytes Relative 19 %   Lymphs Abs 1.9 0.7 - 4.0 K/uL   Monocytes Relative 9 %   Monocytes Absolute 0.9 0.1 - 1.0 K/uL   Eosinophils Relative 3 %   Eosinophils Absolute 0.3 0.0 - 0.5 K/uL   Basophils Relative 1 %   Basophils Absolute 0.1 0.0 - 0.1 K/uL   Immature Granulocytes 0 %   Abs Immature Granulocytes 0.03 0.00 - 0.07 K/uL    Comment: Performed at [facility], [address], [place], [place] [address]  Lipase, blood     Status: None   Collection Time: 07/19/23  2:00 AM  Result Value Ref Range   Lipase 23 11 - 51 U/L    Comment: Performed at [facility], [address], [place], [place] [address]  Troponin I (High Sensitivity)     Status: Abnormal   Collection Time: 07/19/23  2:00 AM  Result Value Ref Range   Troponin I (High Sensitivity) 18  (H) <18 ng/L    Comment: (NOTE) Elevated high sensitivity troponin I (hsTnI) values and significant  changes across serial measurements may suggest ACS but many other  chronic and acute conditions are known to elevate hsTnI results.  Refer to the "Links" section for chest pain algorithms and additional  guidance. Performed at [facility], [address], [place], [place] [address]   Troponin I (High Sensitivity)     Status: Abnormal   Collection Time: 07/19/23  4:20 AM  Result Value Ref Range   Troponin I (High Sensitivity) 35 (H) <18 ng/L    Comment: (NOTE) Elevated high sensitivity troponin I (hsTnI) values and significant  changes across serial measurements may suggest ACS but many other  chronic and acute conditions are known to elevate hsTnI results.  Refer to the "Links" section for chest pain algorithms and additional  guidance. Performed at [facility], [address], [place], [place] [address]    CT Angio Chest/Abd/Pel for Dissection W and/or Wo Contrast  Result Date: 07/19/2023 CLINICAL DATA:  Aortic aneurysm suspected.  Chest pain EXAM: CT ANGIOGRAPHY CHEST, ABDOMEN AND PELVIS TECHNIQUE: Non-contrast CT of the chest was initially obtained. Multidetector CT imaging through the chest, abdomen and pelvis was performed using the standard protocol during bolus administration of intravenous contrast. Multiplanar reconstructed images and MIPs were obtained and reviewed to evaluate the vascular anatomy. RADIATION DOSE REDUCTION: This exam was performed according to the departmental dose-optimization program which includes automated exposure control, adjustment of the mA and/or kV according to patient size and/or use of iterative reconstruction technique. CONTRAST:  OMNIPAQUE IOHEXOL 350 MG/ML SOLN COMPARISON:  Chest x-ray 02/23/2022, CT abdomen pelvis 03/30/2021 FINDINGS: CTA CHEST FINDINGS Cardiovascular: Preferential opacification of the thoracic aorta. No evidence  of thoracic aortic aneurysm or dissection. No central or segmental pulmonary embolus. Chronic right  upper and lower lobe segmental pulmonary artery branching hyperdensities likely related to kyphoplasty cement embolization. Normal heart size. No significant pericardial effusion. Mild atherosclerotic plaque of the thoracic aorta. No coronary artery calcifications. Mediastinum/Nodes: No enlarged mediastinal, hilar, or axillary lymph nodes. Thyroid gland, trachea, and esophagus demonstrate no significant findings. Lungs/Pleura: Bilateral lower lobe and lingular atelectasis. No focal consolidation. No pulmonary nodule. No pulmonary mass. No pleural effusion. No pneumothorax. Musculoskeletal: No chest wall abnormality. No suspicious lytic or blastic osseous lesions. No acute displaced fracture. T11-T12 compression fracture status post kyphoplasty. Associated severe degenerative changes at these levels. Review of the MIP images confirms the above findings. CTA ABDOMEN AND PELVIS FINDINGS VASCULAR Aorta: Mild to moderate atherosclerotic plaque. Normal caliber aorta without aneurysm, dissection, vasculitis or significant stenosis. Celiac: Mild atherosclerotic plaque of the origin. Patent without evidence of aneurysm, dissection, vasculitis or significant stenosis. SMA: Mild atherosclerotic plaque. Patent without evidence of aneurysm, dissection, vasculitis or significant stenosis. Renals: Mild atherosclerotic plaque of the origin of the right renal artery. Both renal arteries are patent without evidence of aneurysm, dissection, vasculitis, fibromuscular dysplasia or significant stenosis. IMA: Patent without evidence of aneurysm, dissection, vasculitis or significant stenosis. Inflow: Mild atherosclerotic plaque. Patent without evidence of aneurysm, dissection, vasculitis or significant stenosis. Veins: No obvious venous abnormality within the limitations of this arterial phase study. Review of the MIP images confirms the  above findings. NON-VASCULAR Hepatobiliary: No focal liver abnormality. Status post cholecystectomy. No biliary dilatation. Pancreas: Diffusely atrophic. No focal lesion. Otherwise normal pancreatic contour. No surrounding inflammatory changes. No main pancreatic ductal dilatation. Spleen: Normal in size without focal abnormality. Adrenals/Urinary Tract: No adrenal nodule bilaterally. Bilateral kidneys enhance symmetrically. Similar-appearing right extrarenal renal pelvis. No hydronephrosis. No hydroureter. Several layering foci of gas within the urinary bladder lumen. The urinary bladder is unremarkable. Stomach/Bowel: Stomach is within normal limits. No evidence of bowel wall thickening or dilatation. Second portion of the duodenum diverticula. Colonic diverticulosis. Appendix appears normal. Lymphatic: No lymphadenopathy. Reproductive: Uterine fibroid along the anterior wall. Otherwise uterus and bilateral adnexa are unremarkable. Other: No intraperitoneal free fluid. No intraperitoneal free gas. No organized fluid collection. Musculoskeletal: Tiny fat containing umbilical hernia. No suspicious lytic or blastic osseous lesions. No acute displaced fracture. L1 superior endplate chronic compression fracture with associated degenerative changes. Review of the MIP images confirms the above findings. IMPRESSION: 1. No acute vascular abnormality. 2. Foci of gas layering within urinary bladder lumen likely related to recent instrumentation-correlate with clinical history. If clinically indicated, correlate with urinalysis for infection. 3. Otherwise no acute intrathoracic, intra-abdominal, intrapelvic abnormality. 4. Other imaging findings of potential clinical significance: Status post cholecystectomy. Chronic right kyphoplasty cement embolization. Colonic diverticulosis with no acute diverticulitis. Uterine fibroid. Aortic Atherosclerosis (ICD10-I70.0). Electronically Signed   By: Tish Frederickson M.D.   On: 07/19/2023  03:27   DG Chest 2 View  Result Date: 07/19/2023 CLINICAL DATA:  Chest pain EXAM: CHEST - 2 VIEW COMPARISON:  02/23/2022 FINDINGS: Lung volumes are small. Small left pleural effusion is present with associated minimal left basilar atelectasis. No pneumothorax. Cardiac size within normal limits. Interval T12 vertebroplasty. T11 vertebroplasty again noted. Progressive focal kyphosis at T10-L1. Stable L1 superior endplate fracture. IMPRESSION: 1. Small left pleural effusion with associated minimal left basilar atelectasis. 2. Interval T12 vertebroplasty. Progressive focal kyphosis at T10-L1. Electronically Signed   By: Helyn Numbers M.D.   On: 07/19/2023 01:33    Pending Labs [employer] (From admission, onward)     Start  Ordered   07/19/23 0336  Urinalysis, Routine w reflex microscopic -Urine, Clean Catch  Once,   URGENT       Question:  Specimen Source  Answer:  Urine, Clean Catch   07/19/23 0335   Signed and Held  Lipoprotein A (LPA)  Tomorrow morning,   R        Signed and Held   Signed and Held  Lipid panel  Tomorrow morning,   R        Signed and Held   Signed and Held  Hemoglobin A1c  Tomorrow morning,   R        Signed and Held   Signed and Held  Basic metabolic panel  Tomorrow morning,   R        Signed and Held   Signed and Held  CBC  Tomorrow morning,   R        Signed and Held            Vitals/Pain Today's Vitals   07/19/23 0503 07/19/23 0503 07/19/23 0515 07/19/23 0522  BP:   128/66   Pulse:   85   Resp:   (!) 21   Temp:      TempSrc:      SpO2:   99%   Weight:    68 kg  Height:    5\' 2"  (1.575 m)  PainSc: 5  5       Isolation Precautions No active isolations  Medications Medications  nitroGLYCERIN (NITROSTAT) SL tablet 0.4 mg (0.4 mg Sublingual Given 07/19/23 0334)  aspirin chewable tablet 324 mg (has no administration in time range)  alum & mag hydroxide-simeth (MAALOX/MYLANTA) 200-200-20 MG/5ML suspension 30 mL (30 mLs Oral Given 07/19/23 0015)   lidocaine (XYLOCAINE) 2 % viscous mouth solution 15 mL (15 mLs Mouth/Throat Given 07/19/23 0015)  fentaNYL (SUBLIMAZE) injection 50 mcg (50 mcg Intravenous Given 07/19/23 0028)  fentaNYL (SUBLIMAZE) injection 100 mcg (100 mcg Intravenous Given 07/19/23 0150)  ondansetron (ZOFRAN) injection 4 mg (4 mg Intravenous Given 07/19/23 0149)  iohexol (OMNIPAQUE) 350 MG/ML injection 100 mL (100 mLs Intravenous Contrast Given 07/19/23 0258)  fentaNYL (SUBLIMAZE) injection 50 mcg (50 mcg Intravenous Given 07/19/23 0416)    Mobility power wheelchair     Focused Assessments Cardiac Assessment Handoff:  Cardiac Rhythm: Normal sinus rhythm Lab Results  Component Value Date   CKTOTAL 21 09/08/2010   CKMB 0.7 09/08/2010   TROPONINI <0.03 05/16/2016   Lab Results  Component Value Date   DDIMER <0.27 04/20/2017   Does the Patient currently have chest pain? Yes    R Recommendations: See Admitting Provider Note  Report given to:   Additional Notes: Pt is alert and oriented, reporting significant central chest pain that did not decrease with administration of SL nitroglycerin, pain has been managed with Fentanyl pushes. Uptrending troponins noted (9->18->35). Pt uses bedside commode, uses power wheel chair at home.

## 2023-07-19 NOTE — Discharge Summary (Signed)
Discharge Summary    Patient ID: Audrey Brown MRN: [id number]; DOB: [birth year]  Admit date: 07/19/2023 Discharge date: 07/19/2023  PCP:  [name], MD   [facility] Providers Cardiologist:  Tonny Bollman, MD     Discharge Diagnoses    Principal Problem:   NSTEMI (non-ST elevated myocardial infarction) [facility]) Active Problems:   Bipolar 1 disorder, mixed (HCC)   HTN (hypertension)    Diagnostic Studies/Procedures    Left heart catheterization 07/19/23   Mid LAD lesion is 25% stenosed.   Prox RCA lesion is 10% stenosed.   The left ventricular systolic function is normal.   LV end diastolic pressure is mildly elevated.  LVEDP 19 mm Hg.   The left ventricular ejection fraction is 55-65% by visual estimate.   There is no aortic valve stenosis.   In the absence of any other complications or medical issues, we expect the patient to be ready for discharge from a cath perspective on 07/19/2023.   Recommend Aspirin 81mg  daily for moderate CAD.   Mild, nonobstructive CAD.  Continue medical therapy. Diagnostic Dominance: Right  _____________   History of Present Illness     Audrey Brown is a 77 y.o. female with a past medical history of GERD, HTN, parkinsonism, essential tremor, bipolar 1, osteoporosis, dementia with short term memory loss, spinal compression fracture, insomnia, and overactive bladder. Patient presented to the ED on 9/9 complaining of chest pain. Patient described the pain as "feeling like a concrete block on their chest". Felt like "there was something heavy on my chest". Pain traveled up into her back but did not radiate into her neck or arms. She also complained of shortness of breath, nausea. Patient also reported having a lot of coughing and gagging prior to going on the ED. She did not have improvement in her symptoms with nitroglycerin. In the ED, hsTn 8, 18, 35. EKG was without ischemic changes. She was given ASA 324 mg and started on heparin  gtt. Admitted to cardiology service for treatment of suspected NSTEMI   Hospital Course     Consultants: None  Patient was admitted to the cardiology service on 9/9 for treatment of NSTEMI, chest pain. Underwent cardiac catheterization on 9/9 that showed 25% stenosis in mid LAD and 10% stenosis in prox RCA. EF was estimated at 55-65%. There was no aortic valve stenosis. After her cardiac catheterization, patient worked with the motility specialist and tolerated activity well. She was seen by Dr. Excell Seltzer who deemed her stable for DC. Recommended outpatient GI evaluation as she has a history of esophageal dilatations in the past.   Chest Pain  - Patient presented to the ED complaining of chest pain. hsTn minimally elevated and peaked at 35. EKG was without ischemic changes - Underwent cardiac catheterization on 9/9 that showed mild nonobstructive disease, normal LV function  - CTA chest/abd/pelvis showed no acute vascular abnormalities, no acute intrathoracic, intrabdominal, intrapelvic abnormalities  - Start ASA 81 mg daily for mild CAD. Patient has been intolerant of statins in the past  - Of note, patient has a history of esophageal dilatations. Recommended that she follows up with a GI specialist as an outpatient  - Discussed radial site care and provided written instructions on DC paperwork  - Patient has a follow up appointment on 9/30   HTN  - Continue home losartan 100 mg daily - BP overall well controlled this admission   Parkinsonism  Resting Tremor  -Continue home propranolol and domperidone  Bipolar 1 Dementia -Continue home bupropion, lamotrigine, and buspirone   Bladder spasms  -Continue home oxybutynin   Osteoporosis  Spinal compression fractures  -Continue home teriparatide   Did the patient have an acute coronary syndrome (MI, NSTEMI, STEMI, etc) this admission?:  No                               Did the patient have a percutaneous coronary intervention (stent /  angioplasty)?:  No.       _____________  Discharge Vitals Blood pressure (!) 116/52, pulse 71, temperature 97.6 F (36.4 C), temperature source Oral, resp. rate 15, height 5\' 2"  (1.575 m), weight 68 kg, SpO2 95%.  Filed Weights   07/19/23 0522  Weight: 68 kg    Labs & Radiologic Studies    CBC Recent Labs    07/19/23 0012  WBC 10.0  NEUTROABS 6.8  HGB 13.0  HCT 39.2  MCV 89.5  PLT 330   Basic Metabolic Panel Recent Labs    78/29/56 0012  NA 135  K 4.4  CL 102  CO2 22  GLUCOSE 110*  BUN 20  CREATININE 1.08*  CALCIUM 9.0   Liver Function Tests Recent Labs    07/19/23 0012  AST 21  ALT 15  ALKPHOS 62  BILITOT 0.5  PROT 6.9  ALBUMIN 3.7   Recent Labs    07/19/23 0200  LIPASE 23   High Sensitivity Troponin:   Recent Labs  Lab 07/19/23 0012 07/19/23 0200 07/19/23 0420  TROPONINIHS 9 18* 35*    BNP Invalid input(s): "POCBNP" D-Dimer No results for input(s): "DDIMER" in the last 72 hours. Hemoglobin A1C No results for input(s): "HGBA1C" in the last 72 hours. Fasting Lipid Panel No results for input(s): "CHOL", "HDL", "LDLCALC", "TRIG", "CHOLHDL", "LDLDIRECT" in the last 72 hours. Thyroid Function Tests No results for input(s): "TSH", "T4TOTAL", "T3FREE", "THYROIDAB" in the last 72 hours.  Invalid input(s): "FREET3" _____________  CARDIAC CATHETERIZATION  Result Date: 07/19/2023   Mid LAD lesion is 25% stenosed.   Prox RCA lesion is 10% stenosed.   The left ventricular systolic function is normal.   LV end diastolic pressure is mildly elevated.  LVEDP 19 mm Hg.   The left ventricular ejection fraction is 55-65% by visual estimate.   There is no aortic valve stenosis.   In the absence of any other complications or medical issues, we expect the patient to be ready for discharge from a cath perspective on 07/19/2023.   Recommend Aspirin 81mg  daily for moderate CAD. Mild, nonobstructive CAD.  Continue medical therapy.   CT Angio Chest/Abd/Pel for  Dissection W and/or Wo Contrast  Result Date: 07/19/2023 CLINICAL DATA:  Aortic aneurysm suspected.  Chest pain EXAM: CT ANGIOGRAPHY CHEST, ABDOMEN AND PELVIS TECHNIQUE: Non-contrast CT of the chest was initially obtained. Multidetector CT imaging through the chest, abdomen and pelvis was performed using the standard protocol during bolus administration of intravenous contrast. Multiplanar reconstructed images and MIPs were obtained and reviewed to evaluate the vascular anatomy. RADIATION DOSE REDUCTION: This exam was performed according to the departmental dose-optimization program which includes automated exposure control, adjustment of the mA and/or kV according to patient size and/or use of iterative reconstruction technique. CONTRAST:  OMNIPAQUE IOHEXOL 350 MG/ML SOLN COMPARISON:  Chest x-ray 02/23/2022, CT abdomen pelvis 03/30/2021 FINDINGS: CTA CHEST FINDINGS Cardiovascular: Preferential opacification of the thoracic aorta. No evidence of thoracic aortic aneurysm or dissection. No central  or segmental pulmonary embolus. Chronic right upper and lower lobe segmental pulmonary artery branching hyperdensities likely related to kyphoplasty cement embolization. Normal heart size. No significant pericardial effusion. Mild atherosclerotic plaque of the thoracic aorta. No coronary artery calcifications. Mediastinum/Nodes: No enlarged mediastinal, hilar, or axillary lymph nodes. Thyroid gland, trachea, and esophagus demonstrate no significant findings. Lungs/Pleura: Bilateral lower lobe and lingular atelectasis. No focal consolidation. No pulmonary nodule. No pulmonary mass. No pleural effusion. No pneumothorax. Musculoskeletal: No chest wall abnormality. No suspicious lytic or blastic osseous lesions. No acute displaced fracture. T11-T12 compression fracture status post kyphoplasty. Associated severe degenerative changes at these levels. Review of the MIP images confirms the above findings. CTA ABDOMEN AND  PELVIS FINDINGS VASCULAR Aorta: Mild to moderate atherosclerotic plaque. Normal caliber aorta without aneurysm, dissection, vasculitis or significant stenosis. Celiac: Mild atherosclerotic plaque of the origin. Patent without evidence of aneurysm, dissection, vasculitis or significant stenosis. SMA: Mild atherosclerotic plaque. Patent without evidence of aneurysm, dissection, vasculitis or significant stenosis. Renals: Mild atherosclerotic plaque of the origin of the right renal artery. Both renal arteries are patent without evidence of aneurysm, dissection, vasculitis, fibromuscular dysplasia or significant stenosis. IMA: Patent without evidence of aneurysm, dissection, vasculitis or significant stenosis. Inflow: Mild atherosclerotic plaque. Patent without evidence of aneurysm, dissection, vasculitis or significant stenosis. Veins: No obvious venous abnormality within the limitations of this arterial phase study. Review of the MIP images confirms the above findings. NON-VASCULAR Hepatobiliary: No focal liver abnormality. Status post cholecystectomy. No biliary dilatation. Pancreas: Diffusely atrophic. No focal lesion. Otherwise normal pancreatic contour. No surrounding inflammatory changes. No main pancreatic ductal dilatation. Spleen: Normal in size without focal abnormality. Adrenals/Urinary Tract: No adrenal nodule bilaterally. Bilateral kidneys enhance symmetrically. Similar-appearing right extrarenal renal pelvis. No hydronephrosis. No hydroureter. Several layering foci of gas within the urinary bladder lumen. The urinary bladder is unremarkable. Stomach/Bowel: Stomach is within normal limits. No evidence of bowel wall thickening or dilatation. Second portion of the duodenum diverticula. Colonic diverticulosis. Appendix appears normal. Lymphatic: No lymphadenopathy. Reproductive: Uterine fibroid along the anterior wall. Otherwise uterus and bilateral adnexa are unremarkable. Other: No intraperitoneal free  fluid. No intraperitoneal free gas. No organized fluid collection. Musculoskeletal: Tiny fat containing umbilical hernia. No suspicious lytic or blastic osseous lesions. No acute displaced fracture. L1 superior endplate chronic compression fracture with associated degenerative changes. Review of the MIP images confirms the above findings. IMPRESSION: 1. No acute vascular abnormality. 2. Foci of gas layering within urinary bladder lumen likely related to recent instrumentation-correlate with clinical history. If clinically indicated, correlate with urinalysis for infection. 3. Otherwise no acute intrathoracic, intra-abdominal, intrapelvic abnormality. 4. Other imaging findings of potential clinical significance: Status post cholecystectomy. Chronic right kyphoplasty cement embolization. Colonic diverticulosis with no acute diverticulitis. Uterine fibroid. Aortic Atherosclerosis (ICD10-I70.0). Electronically Signed   By: Tish Frederickson M.D.   On: 07/19/2023 03:27   DG Chest 2 View  Result Date: 07/19/2023 CLINICAL DATA:  Chest pain EXAM: CHEST - 2 VIEW COMPARISON:  02/23/2022 FINDINGS: Lung volumes are small. Small left pleural effusion is present with associated minimal left basilar atelectasis. No pneumothorax. Cardiac size within normal limits. Interval T12 vertebroplasty. T11 vertebroplasty again noted. Progressive focal kyphosis at T10-L1. Stable L1 superior endplate fracture. IMPRESSION: 1. Small left pleural effusion with associated minimal left basilar atelectasis. 2. Interval T12 vertebroplasty. Progressive focal kyphosis at T10-L1. Electronically Signed   By: Helyn Numbers M.D.   On: 07/19/2023 01:33   Disposition   Pt is being discharged home today in  good condition.  Follow-up Plans & Appointments     Discharge Instructions     AMB referral to Phase II Cardiac Rehabilitation   Complete by: As directed    Elevated troponin   Diagnosis: Other   After initial evaluation and assessments  completed: Virtual Based Care may be provided alone or in conjunction with Phase 2 Cardiac Rehab based on patient barriers.: Yes   Intensive Cardiac Rehabilitation (ICR) [facility] location only OR Traditional Cardiac Rehabilitation (TCR) *If criteria for ICR are not met will enroll in TCR [facility] only): Yes   Call MD for:  difficulty breathing, headache or visual disturbances   Complete by: As directed    Call MD for:  persistant dizziness or light-headedness   Complete by: As directed    Call MD for:  redness, tenderness, or signs of infection (pain, swelling, redness, odor or green/yellow discharge around incision site)   Complete by: As directed    Diet - low sodium heart healthy   Complete by: As directed    Discharge instructions   Complete by: As directed    Radial Site Care Refer to this sheet in the next few weeks. These instructions provide you with information on caring for yourself after your procedure. Your caregiver may also give you more specific instructions. Your treatment has been planned according to current medical practices, but problems sometimes occur. Call your caregiver if you have any problems or questions after your procedure. HOME CARE INSTRUCTIONS You may shower the day after the procedure. Remove the bandage (dressing) and gently wash the site with plain soap and water. Gently pat the site dry.  Do not apply powder or lotion to the site.  Do not submerge the affected site in water for 3 to 5 days.  Inspect the site at least twice daily.  Do not flex or bend the affected arm for 24 hours.  No lifting over 5 pounds (2.3 kg) for 5 days after your procedure.  Do not drive home if you are discharged the same day of the procedure. Have someone else drive you.  You may drive 24 hours after the procedure unless otherwise instructed by your caregiver.  What to expect: Any bruising will usually fade within 1 to 2 weeks.  Blood that collects in the tissue (hematoma) may be painful  to the touch. It should usually decrease in size and tenderness within 1 to 2 weeks.  SEEK IMMEDIATE MEDICAL CARE IF: You have unusual pain at the radial site.  You have redness, warmth, swelling, or pain at the radial site.  You have drainage (other than a small amount of blood on the dressing).  You have chills.  You have a fever or persistent symptoms for more than 72 hours.  You have a fever and your symptoms suddenly get worse.  Your arm becomes pale, cool, tingly, or numb.  You have heavy bleeding from the site. Hold pressure on the site.   Increase activity slowly   Complete by: As directed         Discharge Medications   Allergies as of 07/19/2023       Reactions   Penicillins Hives, Nausea And Vomiting, Nausea Only, Rash, Other (See Comments)   Celebrex [celecoxib] Hives   Morphine And Codeine Hives   Risperdal [risperidone] Other (See Comments)   Tremors and seizures   Statins Other (See Comments)   MUSCLE ACHES   Lisinopril Cough   Hydrocodone Hives   Pt state that she takes  it with Benadryl        Medication List     STOP taking these medications    ALPRAZolam 0.25 MG tablet Commonly known as: XANAX   Oscimin 0.125 MG SL tablet Generic drug: hyoscyamine   oxycodone 5 MG capsule Commonly known as: OXY-IR   oxyCODONE-acetaminophen 5-325 MG tablet Commonly known as: PERCOCET/ROXICET       TAKE these medications    albuterol 108 (90 Base) MCG/ACT inhaler Commonly known as: VENTOLIN HFA Inhale 2 puffs into the lungs every 4 (four) hours as needed for wheezing or shortness of breath.   AMBULATORY NON FORMULARY MEDICATION Take 10 mg by mouth 3 (three) times daily. Medication Name: Domperidone 10 mg   aspirin EC 81 MG tablet Take 1 tablet (81 mg total) by mouth daily. Swallow whole. Start taking on: July 20, 2023   buPROPion 300 MG 24 hr tablet Commonly known as: WELLBUTRIN XL Take 300 mg by mouth daily.   busPIRone 10 MG  tablet Commonly known as: BUSPAR Take 10 mg by mouth 2 (two) times daily.   cyanocobalamin 1000 MCG tablet Commonly known as: VITAMIN B12 Take 1,000 mcg by mouth daily.   esomeprazole 40 MG capsule Commonly known as: NEXIUM Take 1 capsule (40 mg total) by mouth 2 (two) times daily before a meal.   Forteo 600 MCG/2.4ML Sopn Generic drug: Teriparatide (Recombinant) Inject 2.4 mLs into the skin daily.   ibuprofen 800 MG tablet Commonly known as: ADVIL TAKE 1 TABLET EVERY 8 HOURS AS NEEDED FOR PAIN   LamoTRIgine 200 MG Tb24 24 hour tablet Commonly known as: LaMICtal XR Take 1 tablet (200 mg total) by mouth daily.   losartan 100 MG tablet Commonly known as: COZAAR Take 1 tablet (100 mg total) by mouth daily.   Magnesium 500 MG Caps Take 400 mg by mouth at bedtime.   Melatonin 5 MG Caps Take 5 mg by mouth at bedtime.   methocarbamol 500 MG tablet Commonly known as: ROBAXIN Take 250-500 mg by mouth 2 (two) times daily as needed for muscle spasms.   oxybutynin 10 MG 24 hr tablet Commonly known as: DITROPAN-XL TAKE 1 TABLET AT BEDTIME   promethazine 25 MG tablet Commonly known as: PHENERGAN TAKE 1 TABLET EVERY 4 HOURS AS NEEDED FOR NAUSEA   propranolol 10 MG tablet Commonly known as: INDERAL Take 20 mg by mouth 2 (two) times daily.   senna 8.6 MG Tabs tablet Commonly known as: SENOKOT Take 1 tablet by mouth at bedtime.   traMADol 50 MG tablet Commonly known as: ULTRAM TAKE 2 TABLETS EVERY 6 HOURS AS NEEDED FOR MODERATE PAIN   traZODone 100 MG tablet Commonly known as: DESYREL Take 100 mg by mouth at bedtime.   Vitamin D 50 MCG (2000 UT) Caps Take 2,000 Units by mouth daily.           Outstanding Labs/Studies    Duration of Discharge Encounter   Greater than 30 minutes including physician time.  Signed, Jonita Albee, PA-C 07/19/2023, 4:29 PM

## 2023-07-19 NOTE — Progress Notes (Signed)
Interventional Cardiology Note: Patient admitted by the cardiology fellow early this morning.  Please see his note for details.  I agree with his documentation as outlined.  I personally interviewed and examined the patient.  She continues to have mild left-sided chest pain on IV heparin.  I agree that cardiac catheterization and possible PCI is indicated.  The patient has a 3+ right radial pulse. I have reviewed the risks, indications, and alternatives to cardiac catheterization, possible angioplasty, and stenting with the patient. Risks include but are not limited to bleeding, infection, vascular injury, stroke, myocardial infection, arrhythmia, kidney injury, radiation-related injury in the case of prolonged fluoroscopy use, emergency cardiac surgery, and death. The patient understands the risks of serious complication is 1-2 in 1000 with diagnostic cardiac cath and 1-2% or less with angioplasty/stenting.  Patient disposition pending cardiac catheterization result.  AUC Data: ACS: Yes CCS Class: IV Med Rx: 1 drug (propranolol) Prior CABG: no  Tonny Bollman 07/19/2023 9:09 AM

## 2023-07-19 NOTE — Care Management Obs Status (Signed)
MEDICARE OBSERVATION STATUS NOTIFICATION   Patient Details  Name: Audrey Brown MRN: [id number] Date of Birth: [birth year]   Medicare Observation Status Notification Given:  Yes    Kingsley Plan, RN 07/19/2023, 4:40 PM

## 2023-07-19 NOTE — Progress Notes (Signed)
Went over discharge paper work with patient. All questions answered. PIV and telemetry removed. All belongings at bedside.  

## 2023-07-19 NOTE — H&P (View-Only) (Signed)
Interventional Cardiology Note: Patient admitted by the cardiology fellow early this morning.  Please see his note for details.  I agree with his documentation as outlined.  I personally interviewed and examined the patient.  She continues to have mild left-sided chest pain on IV heparin.  I agree that cardiac catheterization and possible PCI is indicated.  The patient has a 3+ right radial pulse. I have reviewed the risks, indications, and alternatives to cardiac catheterization, possible angioplasty, and stenting with the patient. Risks include but are not limited to bleeding, infection, vascular injury, stroke, myocardial infection, arrhythmia, kidney injury, radiation-related injury in the case of prolonged fluoroscopy use, emergency cardiac surgery, and death. The patient understands the risks of serious complication is 1-2 in 1000 with diagnostic cardiac cath and 1-2% or less with angioplasty/stenting.  Patient disposition pending cardiac catheterization result.  AUC Data: ACS: Yes CCS Class: IV Med Rx: 1 drug (propranolol) Prior CABG: no  Audrey Brown 07/19/2023 9:09 AM

## 2023-07-19 NOTE — Progress Notes (Signed)
Mobility Specialist Progress Note:   07/19/23 1500  Mobility  Activity Transferred from chair to bed  Level of Assistance Minimal assist, patient does 75% or more  Assistive Device Front wheel walker  Distance Ambulated (ft) 4 ft  Activity Response Tolerated well  Mobility Referral Yes  $Mobility charge 1 Mobility  Mobility Specialist Start Time (ACUTE ONLY) 1528  Mobility Specialist Stop Time (ACUTE ONLY) 1534  Mobility Specialist Time Calculation (min) (ACUTE ONLY) 6 min    Pt received in chair, requesting to go back to bed. MinA to stand with RUE deficits. Pt left in bed with call bell and all needs met.  [place]ante Earlene Plater Mobility Specialist Please contact via Special educational needs teacher or Rehab office at [phone]

## 2023-07-19 NOTE — Progress Notes (Signed)
ANTICOAGULATION CONSULT NOTE - Initial Consult  Pharmacy Consult for Heparin Indication: chest pain/ACS  Allergies  Allergen Reactions   Celebrex [Celecoxib] Hives   Morphine And Codeine Hives   Penicillins Hives, Nausea Only and Other (See Comments)    Has patient had a PCN reaction causing immediate rash, facial/tongue/throat swelling, SOB or lightheadedness with hypotension: Yes Has patient had a PCN reaction causing severe rash involving mucus membranes or skin necrosis: Yes Has patient had a PCN reaction that required hospitalization: Yes, was already in the hospital  Has patient had a PCN reaction occurring within the last 10 years: No If all of the above answers are "NO", then may proceed with Cephalosporin use.    Risperdal [Risperidone] Other (See Comments)    Tremors and seizures   Statins Other (See Comments)    MUSCLE ACHES   Lisinopril Cough   Hydrocodone Hives    Pt state that she takes it with Benadryl    Patient Measurements: Height: 5\' 2"  (157.5 cm) Weight: 68 kg (149 lb 14.6 oz) IBW/kg (Calculated) : 50.1  Vital Signs: Temp: 97.9 F (36.6 C) (09/09 0133) Temp Source: Oral (09/09 0133) BP: 128/66 (09/09 0515) Pulse Rate: 85 (09/09 0515)  Labs: Recent Labs    07/19/23 0012 07/19/23 0200 07/19/23 0420  HGB 13.0  --   --   HCT 39.2  --   --   PLT 330  --   --   CREATININE 1.08*  --   --   TROPONINIHS 9 18* 35*    Estimated Creatinine Clearance: 41.3 mL/min (A) (by C-G formula based on SCr of 1.08 mg/dL (H)).   Medical History: Past Medical History:  Diagnosis Date   Arthritis    lower back   Bipolar disorder (HCC)    Depression    GERD (gastroesophageal reflux disease)    Glaucoma    bilateral   Hiatal hernia    Loss of vision    right eye   Parkinson disease    Poor short-term memory    Sagittal band rupture at metacarpophalangeal joint 11/2016   right long finger    Medications:  No current facility-administered medications on  file prior to encounter.   Current Outpatient Medications on File Prior to Encounter  Medication Sig Dispense Refill   albuterol (VENTOLIN HFA) 108 (90 Base) MCG/ACT inhaler Inhale 2 puffs into the lungs every 4 (four) hours as needed for wheezing or shortness of breath. 18 g 3   AMBULATORY NON FORMULARY MEDICATION Take 10 mg by mouth 3 (three) times daily. Medication Name: Domperidone 10 mg 270 capsule 3   buPROPion (WELLBUTRIN XL) 300 MG 24 hr tablet Take 300 mg by mouth daily.      busPIRone (BUSPAR) 10 MG tablet Take 10 mg by mouth 2 (two) times daily.      Cholecalciferol (VITAMIN D) 50 MCG (2000 UT) CAPS Take 2,000 Units by mouth daily.     cyanocobalamin (VITAMIN B12) 1000 MCG tablet Take 1,000 mcg by mouth daily.     esomeprazole (NEXIUM) 40 MG capsule Take 1 capsule (40 mg total) by mouth 2 (two) times daily before a meal. 180 capsule 3   ibuprofen (ADVIL) 800 MG tablet TAKE 1 TABLET EVERY 8 HOURS AS NEEDED FOR PAIN 270 tablet 3   LamoTRIgine XR (LAMICTAL XR) 200 MG TB24 Take 1 tablet (200 mg total) by mouth daily. 90 tablet 1   losartan (COZAAR) 100 MG tablet Take 1 tablet (100 mg total) by mouth  daily. 90 tablet 3   Magnesium 500 MG CAPS Take 400 mg by mouth at bedtime.      Melatonin 5 MG CAPS Take 5 mg by mouth at bedtime.     METHOCARBAMOL PO Take 500 mg by mouth 2 (two) times daily as needed (pain).     oxybutynin (DITROPAN-XL) 10 MG 24 hr tablet TAKE 1 TABLET AT BEDTIME 90 tablet 3   promethazine (PHENERGAN) 25 MG tablet TAKE 1 TABLET EVERY 4 HOURS AS NEEDED FOR NAUSEA 180 tablet 3   propranolol (INDERAL) 10 MG tablet Take 20 mg by mouth 2 (two) times daily.     senna (SENOKOT) 8.6 MG TABS tablet Take 1 tablet by mouth at bedtime.     Teriparatide, Recombinant, (FORTEO) 600 MCG/2.4ML SOPN Inject 2.4 mLs into the skin daily.     traMADol (ULTRAM) 50 MG tablet TAKE 2 TABLETS EVERY 6 HOURS AS NEEDED FOR MODERATE PAIN 720 tablet 1   ALPRAZolam (XANAX) 0.25 MG tablet TAKE 1 TABLET  THREE TIMES A DAY AS NEEDED FOR ANXIETY 90 tablet 5   OSCIMIN 0.125 MG SUBL PLACE 1 TABLET UNDER THE TONGUE EVERY 6 HOURS AS NEEDED FOR CRAMPING (Patient taking differently: Place 0.125 mg under the tongue every 6 (six) hours as needed (cramping).) 360 each 0   traZODone (DESYREL) 50 MG tablet Take 50 mg by mouth at bedtime.     [DISCONTINUED] oxyCODONE-acetaminophen (PERCOCET/ROXICET) 5-325 MG tablet Take 1 tablet by mouth every 4 (four) hours as needed for severe pain.       Assessment: 77 y.o. female with chest pain for heparin  Goal of Therapy:  Heparin level 0.3-0.7 units/ml Monitor platelets by anticoagulation protocol: Yes   Plan:  Heparin 3000 units IV bolus, then start heparin 800 units/hr Check heparin level in 6 hours.   Derrich Gaby, Gary Fleet 07/19/2023,5:55 AM

## 2023-07-19 NOTE — Interval H&P Note (Signed)
Cath Lab Visit (complete for each Cath Lab visit)  Clinical Evaluation Leading to the Procedure:   ACS: Yes.    Non-ACS:    Anginal Classification: CCS IV  Anti-ischemic medical therapy: Minimal Therapy (1 class of medications)  Non-Invasive Test Results: No non-invasive testing performed  Prior CABG: No previous CABG      History and Physical Interval Note:  07/19/2023 1:29 PM  Audrey Brown  has presented today for surgery, with the diagnosis of nstemi.  The various methods of treatment have been discussed with the patient and family. After consideration of risks, benefits and other options for treatment, the patient has consented to  Procedure(s): LEFT HEART CATH AND CORONARY ANGIOGRAPHY (N/A) as a surgical intervention.  The patient's history has been reviewed, patient examined, no change in status, stable for surgery.  I have reviewed the patient's chart and labs.  Questions were answered to the patient's satisfaction.     Lance Muss

## 2023-07-19 NOTE — ED Provider Notes (Signed)
[place] EMERGENCY DEPARTMENT AT [facility] Provider Note   CSN: [id number] Arrival date & time: 07/19/23  0002     History  Chief Complaint  Patient presents with   Chest Pain    Audrey Brown is a 77 y.o. female.  The history is provided by the patient and a relative.  Patient w/history of chronic back pain, GERD, bipolar, Parkinson's presents with chest pain.  Patient reports yesterday around 6 PM she began having left-sided chest pain that hurts to move and breathe No recent falls or trauma.  She has never had this before.  Denies known history of CAD/VTE     Past Medical History:  Diagnosis Date   Arthritis    lower back   Bipolar disorder (HCC)    Depression    GERD (gastroesophageal reflux disease)    Glaucoma    bilateral   Hiatal hernia    Loss of vision    right eye   Parkinson disease    Poor short-term memory    Sagittal band rupture at metacarpophalangeal joint 11/2016   right long finger    Home Medications Prior to Admission medications   Medication Sig Start Date End Date Taking? Authorizing Provider  albuterol (VENTOLIN HFA) 108 (90 Base) MCG/ACT inhaler Inhale 2 puffs into the lungs every 4 (four) hours as needed for wheezing or shortness of breath. 01/29/22   [name], MD  ALPRAZolam Prudy Feeler) 0.25 MG tablet TAKE 1 TABLET THREE TIMES A DAY AS NEEDED FOR ANXIETY 01/26/23   [name], MD  AMBULATORY NON FORMULARY MEDICATION Take 10 mg by mouth 3 (three) times daily. Medication Name: Domperidone 10 mg 08/12/22   Esterwood, Amy S, PA-C  buPROPion (WELLBUTRIN XL) 300 MG 24 hr tablet Take 300 mg by mouth daily.  03/27/16   [provider]  busPIRone (BUSPAR) 10 MG tablet Take 10 mg by mouth 2 (two) times daily.     [provider]  Cholecalciferol (VITAMIN D) 50 MCG (2000 UT) CAPS Take 2,000 Units by mouth daily.    [provider]  cyanocobalamin (VITAMIN B12) 1000 MCG tablet Take 1,000 mcg by mouth daily.     [provider]  esomeprazole (NEXIUM) 40 MG capsule Take 1 capsule (40 mg total) by mouth 2 (two) times daily before a meal. 08/12/22   Esterwood, Amy S, PA-C  ibuprofen (ADVIL) 800 MG tablet TAKE 1 TABLET EVERY 8 HOURS AS NEEDED FOR PAIN 05/17/23   [name], MD  LamoTRIgine XR (LAMICTAL XR) 200 MG TB24 Take 1 tablet (200 mg total) by mouth daily. 12/15/13   [name], MD  losartan (COZAAR) 100 MG tablet Take 1 tablet (100 mg total) by mouth daily. 07/07/23   [name], MD  Magnesium 500 MG CAPS Take 400 mg by mouth at bedtime.     [provider]  Melatonin 5 MG CAPS Take 5 mg by mouth at bedtime.    [provider]  METHOCARBAMOL PO Take 500 mg by mouth 2 (two) times daily as needed (pain).    [provider]  OSCIMIN 0.125 MG SUBL PLACE 1 TABLET UNDER THE TONGUE EVERY 6 HOURS AS NEEDED FOR CRAMPING Patient taking differently: Place 0.125 mg under the tongue every 6 (six) hours as needed (cramping). 02/09/17   Beverley Fiedler, MD  oxybutynin (DITROPAN-XL) 10 MG 24 hr tablet TAKE 1 TABLET AT BEDTIME 09/14/22   [name], MD  promethazine [facility])  25 MG tablet TAKE 1 TABLET EVERY 4 HOURS AS NEEDED FOR NAUSEA 04/15/23   [name], MD  propranolol (INDERAL) 10 MG tablet Take 10 mg by mouth 2 (two) times daily. 03/19/22   [provider]  Teriparatide, Recombinant, (FORTEO) 600 MCG/2.4ML SOPN Inject 2.4 mLs into the skin daily.    [provider]  traMADol (ULTRAM) 50 MG tablet TAKE 2 TABLETS EVERY 6 HOURS AS NEEDED FOR MODERATE PAIN 05/14/23   [name], MD  traZODone (DESYREL) 50 MG tablet Take 50 mg by mouth at bedtime.    [provider]      Allergies    Celebrex [celecoxib], Morphine and codeine, Penicillins, Risperdal [risperidone], Statins, Lisinopril, and Hydrocodone    Review of Systems   Review of Systems  Constitutional:  Negative for fever.  Respiratory:  Positive for shortness of breath.    Cardiovascular:  Positive for chest pain. Negative for leg swelling.  Gastrointestinal:  Positive for nausea. Negative for abdominal pain and vomiting.    Physical Exam Updated Vital Signs BP 133/66   Pulse 85   Temp 97.9 F (36.6 C) (Oral)   Resp (!) 21   SpO2 99%  Physical Exam CONSTITUTIONAL: Elderly, uncomfortable appearing HEAD: Normocephalic/atraumatic EYES: EOMI/PERRL ENMT: Mucous membranes moist NECK: supple no meningeal signs CV: S1/S2 noted, no murmurs/rubs/gallops noted LUNGS: Lungs are clear to auscultation bilaterally, no apparent distress Chest-tenderness to the left chest, no bruising or crepitus ABDOMEN: soft, nontender, no rebound or guarding, bowel sounds noted throughout abdomen GU:no cva tenderness NEURO: Pt is awake/alert/appropriate, moves all extremitiesx4.  No facial droop.   EXTREMITIES: pulses normal/equal, full ROM, no lower extremity edema or tenderness SKIN: warm, color normal PSYCH: no abnormalities of mood noted, alert and oriented to situation  ED Results / Procedures / Treatments   Labs (all labs ordered are listed, but only abnormal results are displayed) Labs Reviewed  COMPREHENSIVE METABOLIC PANEL - Abnormal; Notable for the following components:      Result Value   Glucose, Bld 110 (*)    Creatinine, Ser 1.08 (*)    GFR, Estimated 54 (*)    All other components within normal limits  TROPONIN I (HIGH SENSITIVITY) - Abnormal; Notable for the following components:   Troponin I (High Sensitivity) 18 (*)    All other components within normal limits  TROPONIN I (HIGH SENSITIVITY) - Abnormal; Notable for the following components:   Troponin I (High Sensitivity) 35 (*)    All other components within normal limits  CBC WITH DIFFERENTIAL/PLATELET  LIPASE, BLOOD  URINALYSIS, ROUTINE W REFLEX MICROSCOPIC  TROPONIN I (HIGH SENSITIVITY)    EKG ED ECG REPORT   Date: 07/18/2023 2354  Rate: 88  Rhythm: normal sinus rhythm  QRS Axis:  normal  Intervals: normal  ST/T Wave abnormalities: normal  Conduction Disutrbances:none  Narrative Interpretation:   Old EKG Reviewed: unchanged  I have personally reviewed the EKG tracing and agree with the computerized printout as noted.   Radiology CT Angio Chest/Abd/Pel for Dissection W and/or Wo Contrast  Result Date: 07/19/2023 CLINICAL DATA:  Aortic aneurysm suspected.  Chest pain EXAM: CT ANGIOGRAPHY CHEST, ABDOMEN AND PELVIS TECHNIQUE: Non-contrast CT of the chest was initially obtained. Multidetector CT imaging through the chest, abdomen and pelvis was performed using the standard protocol during bolus administration of intravenous contrast. Multiplanar reconstructed images and MIPs were obtained and reviewed to evaluate the vascular anatomy. RADIATION DOSE REDUCTION: This exam was performed according to the departmental dose-optimization  program which includes automated exposure control, adjustment of the mA and/or kV according to patient size and/or use of iterative reconstruction technique. CONTRAST:  OMNIPAQUE IOHEXOL 350 MG/ML SOLN COMPARISON:  Chest x-ray 02/23/2022, CT abdomen pelvis 03/30/2021 FINDINGS: CTA CHEST FINDINGS Cardiovascular: Preferential opacification of the thoracic aorta. No evidence of thoracic aortic aneurysm or dissection. No central or segmental pulmonary embolus. Chronic right upper and lower lobe segmental pulmonary artery branching hyperdensities likely related to kyphoplasty cement embolization. Normal heart size. No significant pericardial effusion. Mild atherosclerotic plaque of the thoracic aorta. No coronary artery calcifications. Mediastinum/Nodes: No enlarged mediastinal, hilar, or axillary lymph nodes. Thyroid gland, trachea, and esophagus demonstrate no significant findings. Lungs/Pleura: Bilateral lower lobe and lingular atelectasis. No focal consolidation. No pulmonary nodule. No pulmonary mass. No pleural effusion. No pneumothorax.  Musculoskeletal: No chest wall abnormality. No suspicious lytic or blastic osseous lesions. No acute displaced fracture. T11-T12 compression fracture status post kyphoplasty. Associated severe degenerative changes at these levels. Review of the MIP images confirms the above findings. CTA ABDOMEN AND PELVIS FINDINGS VASCULAR Aorta: Mild to moderate atherosclerotic plaque. Normal caliber aorta without aneurysm, dissection, vasculitis or significant stenosis. Celiac: Mild atherosclerotic plaque of the origin. Patent without evidence of aneurysm, dissection, vasculitis or significant stenosis. SMA: Mild atherosclerotic plaque. Patent without evidence of aneurysm, dissection, vasculitis or significant stenosis. Renals: Mild atherosclerotic plaque of the origin of the right renal artery. Both renal arteries are patent without evidence of aneurysm, dissection, vasculitis, fibromuscular dysplasia or significant stenosis. IMA: Patent without evidence of aneurysm, dissection, vasculitis or significant stenosis. Inflow: Mild atherosclerotic plaque. Patent without evidence of aneurysm, dissection, vasculitis or significant stenosis. Veins: No obvious venous abnormality within the limitations of this arterial phase study. Review of the MIP images confirms the above findings. NON-VASCULAR Hepatobiliary: No focal liver abnormality. Status post cholecystectomy. No biliary dilatation. Pancreas: Diffusely atrophic. No focal lesion. Otherwise normal pancreatic contour. No surrounding inflammatory changes. No main pancreatic ductal dilatation. Spleen: Normal in size without focal abnormality. Adrenals/Urinary Tract: No adrenal nodule bilaterally. Bilateral kidneys enhance symmetrically. Similar-appearing right extrarenal renal pelvis. No hydronephrosis. No hydroureter. Several layering foci of gas within the urinary bladder lumen. The urinary bladder is unremarkable. Stomach/Bowel: Stomach is within normal limits. No evidence of bowel  wall thickening or dilatation. Second portion of the duodenum diverticula. Colonic diverticulosis. Appendix appears normal. Lymphatic: No lymphadenopathy. Reproductive: Uterine fibroid along the anterior wall. Otherwise uterus and bilateral adnexa are unremarkable. Other: No intraperitoneal free fluid. No intraperitoneal free gas. No organized fluid collection. Musculoskeletal: Tiny fat containing umbilical hernia. No suspicious lytic or blastic osseous lesions. No acute displaced fracture. L1 superior endplate chronic compression fracture with associated degenerative changes. Review of the MIP images confirms the above findings. IMPRESSION: 1. No acute vascular abnormality. 2. Foci of gas layering within urinary bladder lumen likely related to recent instrumentation-correlate with clinical history. If clinically indicated, correlate with urinalysis for infection. 3. Otherwise no acute intrathoracic, intra-abdominal, intrapelvic abnormality. 4. Other imaging findings of potential clinical significance: Status post cholecystectomy. Chronic right kyphoplasty cement embolization. Colonic diverticulosis with no acute diverticulitis. Uterine fibroid. Aortic Atherosclerosis (ICD10-I70.0). Electronically Signed   By: Tish Frederickson M.D.   On: 07/19/2023 03:27   DG Chest 2 View  Result Date: 07/19/2023 CLINICAL DATA:  Chest pain EXAM: CHEST - 2 VIEW COMPARISON:  02/23/2022 FINDINGS: Lung volumes are small. Small left pleural effusion is present with associated minimal left basilar atelectasis. No pneumothorax. Cardiac size within normal limits. Interval T12 vertebroplasty. T11  vertebroplasty again noted. Progressive focal kyphosis at T10-L1. Stable L1 superior endplate fracture. IMPRESSION: 1. Small left pleural effusion with associated minimal left basilar atelectasis. 2. Interval T12 vertebroplasty. Progressive focal kyphosis at T10-L1. Electronically Signed   By: Helyn Numbers M.D.   On: 07/19/2023 01:33     Procedures .Critical Care  Performed by: Zadie Rhine, MD Authorized by: Zadie Rhine, MD   Critical care provider statement:    Critical care time (minutes):  45   Critical care start time:  07/19/2023 4:30 AM   Critical care end time:  07/19/2023 5:15 AM   Critical care time was exclusive of:  Separately billable procedures and treating other patients   Critical care was necessary to treat or prevent imminent or life-threatening deterioration of the following conditions:  Cardiac failure   Critical care was time spent personally by me on the following activities:  Examination of patient, development of treatment plan with patient or surrogate, pulse oximetry, ordering and review of radiographic studies, ordering and review of laboratory studies, ordering and performing treatments and interventions, re-evaluation of patient's condition, review of old charts, obtaining history from patient or surrogate and evaluation of patient's response to treatment   I assumed direction of critical care for this patient from another provider in my specialty: no     Care discussed with: admitting provider       Medications Ordered in ED Medications  nitroGLYCERIN (NITROSTAT) SL tablet 0.4 mg (0.4 mg Sublingual Given 07/19/23 0334)  aspirin chewable tablet 324 mg (has no administration in time range)  alum & mag hydroxide-simeth (MAALOX/MYLANTA) 200-200-20 MG/5ML suspension 30 mL (30 mLs Oral Given 07/19/23 0015)  lidocaine (XYLOCAINE) 2 % viscous mouth solution 15 mL (15 mLs Mouth/Throat Given 07/19/23 0015)  fentaNYL (SUBLIMAZE) injection 50 mcg (50 mcg Intravenous Given 07/19/23 0028)  fentaNYL (SUBLIMAZE) injection 100 mcg (100 mcg Intravenous Given 07/19/23 0150)  ondansetron (ZOFRAN) injection 4 mg (4 mg Intravenous Given 07/19/23 0149)  iohexol (OMNIPAQUE) 350 MG/ML injection 100 mL (100 mLs Intravenous Contrast Given 07/19/23 0258)  fentaNYL (SUBLIMAZE) injection 50 mcg (50 mcg Intravenous Given 07/19/23  0416)    ED Course/ Medical Decision Making/ A&P Clinical Course as of 07/19/23 0518  Mon Jul 19, 2023  0430 Patient presents with increasing chest pain.  Initial imaging did not reveal any obvious signs of acute aortic dissection.  However patient is having continued chest pain unresponsive to nitroglycerin.  There is now change in her troponin.  No acute EKG changes.  I have consulted cardiology for evaluation [DW]  [phone] Patient with escalating troponins however her chest pain is now improved and she is resting comfortably.  Blood pressure is better Discussed with on-call cardiology fellow who will admit for unstable angina and early non-STEMI  Patient is agreeable with plan.  She reports she has no allergy to aspirin and can take it.  Will also start heparin as she has had no recent bleeding issues [DW]    Clinical Course User Index [DW] Zadie Rhine, MD                                 Medical Decision Making Amount and/or Complexity of Data Reviewed Labs: ordered. Radiology: ordered.  Risk OTC drugs. Prescription drug management. Decision regarding hospitalization.   This patient presents to the ED for concern of chest pain, this involves an extensive number of treatment options, and is a complaint that  carries with it a high risk of complications and morbidity.  The differential diagnosis includes but is not limited to acute coronary syndrome, aortic dissection, pulmonary embolism, pericarditis, pneumothorax, pneumonia, myocarditis, pleurisy, esophageal rupture    Comorbidities that complicate the patient evaluation: Patient's presentation is complicated by their history of chronic pain  Social Determinants of Health: Patient's  chronic pain   increases the complexity of managing their presentation  Additional history obtained: Additional history obtained from family Records reviewed  Care Everywhere records  Lab Tests: I Ordered, and personally interpreted labs.   The pertinent results include: Elevated troponin  Imaging Studies ordered: I ordered imaging studies including X-ray chest CT dissection I independently visualized and interpreted imaging which showed no acute aortic syndrome or PE I agree with the radiologist interpretation  Cardiac Monitoring: The patient was maintained on a cardiac monitor.  I personally viewed and interpreted the cardiac monitor which showed an underlying rhythm of:  sinus rhythm  Medicines ordered and prescription drug management: I ordered medication including nitroglycerin for pain Reevaluation of the patient after these medicines showed that the patient    stayed the same   Critical Interventions:   patient for chest pain and unstable angina  Consultations Obtained: I requested consultation with the consultant cardiology , and discussed  findings as well as pertinent plan - they recommend: Admit their service  Reevaluation: After the interventions noted above, I reevaluated the patient and found that they have :stayed the same  Complexity of problems addressed: Patient's presentation is most consistent with  acute presentation with potential threat to life or bodily function  Disposition: After consideration of the diagnostic results and the patient's response to treatment,  I feel that the patent would benefit from admission   .           Final Clinical Impression(s) / ED Diagnoses Final diagnoses:  Unstable angina [facility])    Rx / DC Orders ED Discharge Orders     None         Zadie Rhine, MD 07/19/23 [phone]

## 2023-07-19 NOTE — ED Notes (Addendum)
GI cocktail did not help patient's pain. Pt reports pain is radiating to left shoulder as well. Pain worse with deep inspiration and palpation by EDP.

## 2023-07-19 NOTE — Progress Notes (Signed)
Cardiology follow-up note: Nonobstructive CAD at cath. Pt clinically stable. CTA study chest/abd/pelvis without acute findings. Medically stable for DC today. Updated patient at bedside and spoke with daughter over the phone. Recommend OP GI evaluation as she has hx of esophageal dilatations in the past. No other cardiac workup necessary at this time. Personally reviewed cath films with mild nonobstructive CAD and normal LV function.  Tonny Bollman 07/19/2023 3:46 PM

## 2023-07-19 NOTE — H&P (Signed)
Cardiology Admission History and Physical:   Patient ID: Audrey Brown MRN: [id number]; DOB: 1946-04-25   Admission date: 07/19/2023  Primary Care Provider: [name], MD [facility] Cardiologist: None  [facility] Electrophysiologist:  None   Chief Complaint:  chest pain  Patient Profile:   Audrey Brown is a 77 y.o. female with GERD, HTN, parkinsonism, essential tremor, bipolar 1, osteoporosis, dementia with short term memory loss, spinal compression fracture, insomnia, and overactive bladder who is being seen today for the evaluation of chest pain at the request of the ED.   History of Present Illness:    Audrey Brown reports that she has been coughing and gagging a lot recentely. Around 6pm, she started having chest pain. For this she layed down, without improvement. It continues to progress.and get worse. She describes the chest pain like "a concrete block is on my chest", or "like somethere very heavy is on my chest". It feels "dull"  and like "someone is squeezing it". It travels up into her back, but not into her neck or arms. She has associated SOB and nausea with dry heaving. The pain is > a 10/10. It has not been relieved by any medications in the ED. The pain is worse with deep breaths and coughing. The pain is improved if she is laying on her side. She had new SOB since 6 pm, and that "she can't quite catch her breath". She felt no improvement with SL nitroglycerin.   At baseline, she does not ever have chest pain, and has never felt like this before.   She reports having a cath in the early 90s in [place], which showed "nothing".   Past Medical History:  Diagnosis Date   Arthritis    lower back   Bipolar disorder (HCC)    Depression    GERD (gastroesophageal reflux disease)    Glaucoma    bilateral   Hiatal hernia    Loss of vision    right eye   Parkinson disease    Poor short-term memory    Sagittal band rupture at metacarpophalangeal joint  11/2016   right long finger    Past Surgical History:  Procedure Laterality Date   ANKLE SURGERY     BALLOON DILATION N/A 10/29/2017   Procedure: BALLOON DILATION;  Surgeon: Beverley Fiedler, MD;  Location: [facility];  Service: Gastroenterology;  Laterality: N/A;   CATARACT EXTRACTION Right 08/07/2020   CERVICAL DISC SURGERY  1994   CESAREAN SECTION     CHOLECYSTECTOMY     CYST REMOVAL NECK     ESOPHAGEAL DILATION  12/19/2018   Procedure: ESOPHAGEAL DILATION;  Surgeon: Beverley Fiedler, MD;  Location: [facility];  Service: Gastroenterology;;   ESOPHAGOGASTRODUODENOSCOPY (EGD) WITH ESOPHAGEAL DILATION  07/27/2012   with Propofol   ESOPHAGOGASTRODUODENOSCOPY (EGD) WITH PROPOFOL N/A 06/23/2016   Procedure: ESOPHAGOGASTRODUODENOSCOPY (EGD) WITH PROPOFOL;  Surgeon: Beverley Fiedler, MD;  Location: [facility];  Service: Gastroenterology;  Laterality: N/A;   ESOPHAGOGASTRODUODENOSCOPY (EGD) WITH PROPOFOL N/A 10/29/2017   Procedure: ESOPHAGOGASTRODUODENOSCOPY (EGD) WITH PROPOFOL;  Surgeon: Beverley Fiedler, MD;  Location: [facility];  Service: Gastroenterology;  Laterality: N/A;   ESOPHAGOGASTRODUODENOSCOPY (EGD) WITH PROPOFOL N/A 12/19/2018   Procedure: ESOPHAGOGASTRODUODENOSCOPY (EGD) WITH PROPOFOL;  Surgeon: Beverley Fiedler, MD;  Location: [facility];  Service: Gastroenterology;  Laterality: N/A;   EYE SURGERY     EYE SURGERY  09/2019   R eye procedure to relieve pressure   EYE SURGERY  FOOT NEUROMA SURGERY Left    ORIF DISTAL RADIUS FRACTURE Right 07/04/2010   SAVORY DILATION N/A 06/23/2016   Procedure: SAVORY DILATION;  Surgeon: Beverley Fiedler, MD;  Location: [facility];  Service: Gastroenterology;  Laterality: N/A;   TENDON REPAIR Right 12/01/2016   Procedure: right long finger ulnar sagittal band reconstruction;  Surgeon: Betha Loa, MD;  Location: [facility];  Service: Orthopedics;  Laterality: Right;   TONSILLECTOMY       Medications Prior to Admission: Prior to  Admission medications   Medication Sig Start Date End Date Taking? Authorizing Provider  albuterol (VENTOLIN HFA) 108 (90 Base) MCG/ACT inhaler Inhale 2 puffs into the lungs every 4 (four) hours as needed for wheezing or shortness of breath. 01/29/22   [name], MD  ALPRAZolam Prudy Feeler) 0.25 MG tablet TAKE 1 TABLET THREE TIMES A DAY AS NEEDED FOR ANXIETY 01/26/23   [name], MD  AMBULATORY NON FORMULARY MEDICATION Take 10 mg by mouth 3 (three) times daily. Medication Name: Domperidone 10 mg 08/12/22   Esterwood, Amy S, PA-C  buPROPion (WELLBUTRIN XL) 300 MG 24 hr tablet Take 300 mg by mouth daily.  03/27/16   [provider]  busPIRone (BUSPAR) 10 MG tablet Take 10 mg by mouth 2 (two) times daily.     [provider]  Cholecalciferol (VITAMIN D) 50 MCG (2000 UT) CAPS Take 2,000 Units by mouth daily.    [provider]  cyanocobalamin (VITAMIN B12) 1000 MCG tablet Take 1,000 mcg by mouth daily.    [provider]  esomeprazole (NEXIUM) 40 MG capsule Take 1 capsule (40 mg total) by mouth 2 (two) times daily before a meal. 08/12/22   Esterwood, Amy S, PA-C  ibuprofen (ADVIL) 800 MG tablet TAKE 1 TABLET EVERY 8 HOURS AS NEEDED FOR PAIN 05/17/23   [name], MD  LamoTRIgine XR (LAMICTAL XR) 200 MG TB24 Take 1 tablet (200 mg total) by mouth daily. 12/15/13   [name], MD  losartan (COZAAR) 100 MG tablet Take 1 tablet (100 mg total) by mouth daily. 07/07/23   [name], MD  Magnesium 500 MG CAPS Take 400 mg by mouth at bedtime.     [provider]  Melatonin 5 MG CAPS Take 5 mg by mouth at bedtime.    [provider]  METHOCARBAMOL PO Take 500 mg by mouth 2 (two) times daily as needed (pain).    [provider]  OSCIMIN 0.125 MG SUBL PLACE 1 TABLET UNDER THE TONGUE EVERY 6 HOURS AS NEEDED FOR CRAMPING Patient taking differently: Place 0.125 mg under the tongue every 6 (six) hours as needed (cramping). 02/09/17   Pyrtle, Carie Caddy, MD   oxybutynin (DITROPAN-XL) 10 MG 24 hr tablet TAKE 1 TABLET AT BEDTIME 09/14/22   [name], MD  promethazine (PHENERGAN) 25 MG tablet TAKE 1 TABLET EVERY 4 HOURS AS NEEDED FOR NAUSEA 04/15/23   [name], MD  propranolol (INDERAL) 10 MG tablet Take 10 mg by mouth 2 (two) times daily. 03/19/22   [provider]  Teriparatide, Recombinant, (FORTEO) 600 MCG/2.4ML SOPN Inject 2.4 mLs into the skin daily.    [provider]  traMADol (ULTRAM) 50 MG tablet TAKE 2 TABLETS EVERY 6 HOURS AS NEEDED FOR MODERATE PAIN 05/14/23   [name], MD  traZODone (DESYREL) 50 MG tablet Take 50 mg by mouth at bedtime.    [provider]     Allergies:  Allergies  Allergen Reactions   Celebrex [Celecoxib] Hives   Morphine And Codeine Hives   Penicillins Hives, Nausea Only and Other (See Comments)    Has patient had a PCN reaction causing immediate rash, facial/tongue/throat swelling, SOB or lightheadedness with hypotension: Yes Has patient had a PCN reaction causing severe rash involving mucus membranes or skin necrosis: Yes Has patient had a PCN reaction that required hospitalization: Yes, was already in the hospital  Has patient had a PCN reaction occurring within the last 10 years: No If all of the above answers are "NO", then may proceed with Cephalosporin use.    Risperdal [Risperidone] Other (See Comments)    Tremors and seizures   Statins Other (See Comments)    MUSCLE ACHES   Lisinopril Cough   Hydrocodone Hives    Pt state that she takes it with Benadryl    Social History:   Social History   Socioeconomic History   Marital status: Widowed    Spouse name: Not on file   Number of children: 4   Years of education: 6 years college   Highest education level: Master's degree (e.g., MA, MS, MEng, MEd, MSW, MBA)  Occupational History   Occupation: Retired    Associate Professor: RETIRED    Comment: [employer]  Tobacco Use   Smoking status: Never   Smokeless tobacco: Never   Vaping Use   Vaping status: Never Used  Substance and Sexual Activity   Alcohol use: No    Alcohol/week: 0.0 standard drinks of alcohol   Drug use: No   Sexual activity: Not Currently  Other Topics Concern   Not on file  Social History Narrative   Daughter is her POA and must come to all visits in [facility] and [facility]       Patient is right-handed. She lives with her daughter and granddaughter in a split level home with a chair lift. Uses a power chair. She drinks 3-4 cups of coffee a day.      Has 4 daughters, 3 grand children, and 5 great-grand children.   Social Determinants of Health   Financial Resource Strain: Low Risk  (03/19/2023)   Overall Financial Resource Strain (CARDIA)    Difficulty of Paying Living Expenses: Not hard at all  Food Insecurity: No Food Insecurity (03/19/2023)   Hunger Vital Sign    Worried About Running Out of Food in the Last Year: Never true    Ran Out of Food in the Last Year: Never true  Transportation Needs: No Transportation Needs (03/19/2023)   PRAPARE - Administrator, Civil Service (Medical): No    Lack of Transportation (Non-Medical): No  Physical Activity: Inactive (03/19/2023)   Exercise Vital Sign    Days of Exercise per Week: 0 days    Minutes of Exercise per Session: 0 min  Stress: No Stress Concern Present (03/19/2023)   [employer] of Occupational Health - Occupational Stress Questionnaire    Feeling of Stress : Not at all  Social Connections: Moderately Integrated (03/19/2023)   Social Connection and Isolation Panel [NHANES]    Frequency of Communication with Friends and Family: More than three times a week    Frequency of Social Gatherings with Friends and Family: More than three times a week    Attends Religious Services: More than 4 times per year    Active Member of [employer] or Organizations: Yes    Attends Banker Meetings: More than 4 times per year    Marital  Status: Widowed  Intimate Partner Violence: Not  At Risk (03/19/2023)   Humiliation, Afraid, Rape, and Kick questionnaire    Fear of Current or Ex-Partner: No    Emotionally Abused: No    Physically Abused: No    Sexually Abused: No    Family History:   The patient's family history includes Emphysema in her mother; Heart attack in her father; Hypertension in an other family member; Lung cancer in an other family member. There is no history of Colon cancer, Esophageal cancer, Rectal cancer, or Stomach cancer.    ROS:   Review of Systems: [y] = yes, [ ]  = no      General: Weight gain [ ] ; Weight loss [ ] ; Anorexia [ ] ; Fatigue [ ] ; Fever [ ] ; Chills [ ] ; Weakness [ ]    Cardiac: Chest pain/pressure [ y]; Resting SOB [ y]; Exertional SOB [ ] ; Orthopnea [ ] ; Pedal Edema [ ] ; Palpitations [ ] ; Syncope [ ] ; Presyncope [ ] ; Paroxysmal nocturnal dyspnea [ ]    Pulmonary: Cough [ ] ; Wheezing [ ] ; Hemoptysis [ ] ; Sputum [ ] ; Snoring [ ]    GI: Vomiting [ ] ; Dysphagia [ ] ; Melena [ ] ; Hematochezia [ ] ; Heartburn [ ] ; Abdominal pain [ ] ; Constipation [ ] ; Diarrhea [ ] ; BRBPR [ ]    GU: Hematuria [ ] ; Dysuria [ ] ; Nocturia [ ]  Vascular: Pain in legs with walking [ ] ; Pain in feet with lying flat [ ] ; Non-healing sores [ ] ; Stroke [ ] ; TIA [ ] ; Slurred speech [ ] ;   Neuro: Headaches [ ] ; Vertigo [ ] ; Seizures [ ] ; Paresthesias [ ] ;Blurred vision [ ] ; Diplopia [ ] ; Vision changes [ ]    Ortho/Skin: Arthritis [ ] ; Joint pain [ ] ; Muscle pain [ ] ; Joint swelling [ ] ; Back Pain [ ] ; Rash [ ]    Psych: Depression [ ] ; Anxiety [ ]    Heme: Bleeding problems [ ] ; Clotting disorders [ ] ; Anemia [ ]    Endocrine: Diabetes [ ] ; Thyroid dysfunction [ ]    Physical Exam/Data:   Vitals:   07/19/23 0215 07/19/23 0345 07/19/23 0400 07/19/23 0500  BP: (!) 153/78 (!) 150/78 (!) 151/73 133/66  Pulse: 84 85 86 85  Resp: (!) 21 19 (!) 23 (!) 21  Temp:      TempSrc:      SpO2: 100% 93% 96% 99%   No intake or output data in the 24 hours ending 07/19/23 0516    03/19/2023     2:58 PM 10/23/2022   11:15 AM 08/12/2022    1:30 PM  Last 3 Weights  Weight (lbs) 150 lb 170 lb 180 lb  Weight (kg) 68.04 kg 77.111 kg 81.647 kg     There is no height or weight on file to calculate BMI.  General:  Well nourished, well developed, appears very uncomfortable HEENT: normal Lymph: no adenopathy Neck: no JVD Endocrine:  No thryomegaly Vascular: No carotid bruits; FA pulses 2+ bilaterally without bruits  Cardiac:  normal S1, S2; RRR; no murmur, pain to palpation of the anterior chest Lungs:  clear to auscultation bilaterally, no wheezing, rhonchi or rales  Abd: soft, nontender, no hepatomegaly  Ext: no edema Musculoskeletal:  No deformities, BUE and BLE strength normal and equal Skin: warm and dry  Neuro:  CNs 2-12 intact, no focal abnormalities noted Psych:  Normal affect    EKG:  The EKG was personally reviewed and demonstrates:  NSR, interventricular conduction delay with LBBB pattern Telemetry:  Telemetry was personally reviewed and demonstrates:  NSR   Relevant CV Studies: Aortic atherosclerosis, no CAC  Laboratory Data:  High Sensitivity Troponin:   Recent Labs  Lab 07/19/23 0012 07/19/23 0200 07/19/23 0420  TROPONINIHS 9 18* 35*      Chemistry Recent Labs  Lab 07/19/23 0012  NA 135  K 4.4  CL 102  CO2 22  GLUCOSE 110*  BUN 20  CREATININE 1.08*  CALCIUM 9.0  GFRNONAA 54*  ANIONGAP 11    Recent Labs  Lab 07/19/23 0012  PROT 6.9  ALBUMIN 3.7  AST 21  ALT 15  ALKPHOS 62  BILITOT 0.5   Hematology Recent Labs  Lab 07/19/23 0012  WBC 10.0  RBC 4.38  HGB 13.0  HCT 39.2  MCV 89.5  MCH 29.7  MCHC 33.2  RDW 12.7  PLT 330   BNPNo results for input(s): "BNP", "PROBNP" in the last 168 hours.  DDimer No results for input(s): "DDIMER" in the last 168 hours.  Radiology/Studies:  CT Angio Chest/Abd/Pel for Dissection W and/or Wo Contrast  Result Date: 07/19/2023 CLINICAL DATA:  Aortic aneurysm suspected.  Chest pain EXAM: CT  ANGIOGRAPHY CHEST, ABDOMEN AND PELVIS TECHNIQUE: Non-contrast CT of the chest was initially obtained. Multidetector CT imaging through the chest, abdomen and pelvis was performed using the standard protocol during bolus administration of intravenous contrast. Multiplanar reconstructed images and MIPs were obtained and reviewed to evaluate the vascular anatomy. RADIATION DOSE REDUCTION: This exam was performed according to the departmental dose-optimization program which includes automated exposure control, adjustment of the mA and/or kV according to patient size and/or use of iterative reconstruction technique. CONTRAST:  OMNIPAQUE IOHEXOL 350 MG/ML SOLN COMPARISON:  Chest x-ray 02/23/2022, CT abdomen pelvis 03/30/2021 FINDINGS: CTA CHEST FINDINGS Cardiovascular: Preferential opacification of the thoracic aorta. No evidence of thoracic aortic aneurysm or dissection. No central or segmental pulmonary embolus. Chronic right upper and lower lobe segmental pulmonary artery branching hyperdensities likely related to kyphoplasty cement embolization. Normal heart size. No significant pericardial effusion. Mild atherosclerotic plaque of the thoracic aorta. No coronary artery calcifications. Mediastinum/Nodes: No enlarged mediastinal, hilar, or axillary lymph nodes. Thyroid gland, trachea, and esophagus demonstrate no significant findings. Lungs/Pleura: Bilateral lower lobe and lingular atelectasis. No focal consolidation. No pulmonary nodule. No pulmonary mass. No pleural effusion. No pneumothorax. Musculoskeletal: No chest wall abnormality. No suspicious lytic or blastic osseous lesions. No acute displaced fracture. T11-T12 compression fracture status post kyphoplasty. Associated severe degenerative changes at these levels. Review of the MIP images confirms the above findings. CTA ABDOMEN AND PELVIS FINDINGS VASCULAR Aorta: Mild to moderate atherosclerotic plaque. Normal caliber aorta without aneurysm, dissection,  vasculitis or significant stenosis. Celiac: Mild atherosclerotic plaque of the origin. Patent without evidence of aneurysm, dissection, vasculitis or significant stenosis. SMA: Mild atherosclerotic plaque. Patent without evidence of aneurysm, dissection, vasculitis or significant stenosis. Renals: Mild atherosclerotic plaque of the origin of the right renal artery. Both renal arteries are patent without evidence of aneurysm, dissection, vasculitis, fibromuscular dysplasia or significant stenosis. IMA: Patent without evidence of aneurysm, dissection, vasculitis or significant stenosis. Inflow: Mild atherosclerotic plaque. Patent without evidence of aneurysm, dissection, vasculitis or significant stenosis. Veins: No obvious venous abnormality within the limitations of this arterial phase study. Review of the MIP images confirms the above findings. NON-VASCULAR Hepatobiliary: No focal liver abnormality. Status post cholecystectomy. No biliary dilatation. Pancreas: Diffusely atrophic. No focal lesion. Otherwise normal pancreatic contour. No surrounding inflammatory changes. No main pancreatic ductal dilatation. Spleen: Normal in size  without focal abnormality. Adrenals/Urinary Tract: No adrenal nodule bilaterally. Bilateral kidneys enhance symmetrically. Similar-appearing right extrarenal renal pelvis. No hydronephrosis. No hydroureter. Several layering foci of gas within the urinary bladder lumen. The urinary bladder is unremarkable. Stomach/Bowel: Stomach is within normal limits. No evidence of bowel wall thickening or dilatation. Second portion of the duodenum diverticula. Colonic diverticulosis. Appendix appears normal. Lymphatic: No lymphadenopathy. Reproductive: Uterine fibroid along the anterior wall. Otherwise uterus and bilateral adnexa are unremarkable. Other: No intraperitoneal free fluid. No intraperitoneal free gas. No organized fluid collection. Musculoskeletal: Tiny fat containing umbilical hernia. No  suspicious lytic or blastic osseous lesions. No acute displaced fracture. L1 superior endplate chronic compression fracture with associated degenerative changes. Review of the MIP images confirms the above findings. IMPRESSION: 1. No acute vascular abnormality. 2. Foci of gas layering within urinary bladder lumen likely related to recent instrumentation-correlate with clinical history. If clinically indicated, correlate with urinalysis for infection. 3. Otherwise no acute intrathoracic, intra-abdominal, intrapelvic abnormality. 4. Other imaging findings of potential clinical significance: Status post cholecystectomy. Chronic right kyphoplasty cement embolization. Colonic diverticulosis with no acute diverticulitis. Uterine fibroid. Aortic Atherosclerosis (ICD10-I70.0). Electronically Signed   By: Tish Frederickson M.D.   On: 07/19/2023 03:27   DG Chest 2 View  Result Date: 07/19/2023 CLINICAL DATA:  Chest pain EXAM: CHEST - 2 VIEW COMPARISON:  02/23/2022 FINDINGS: Lung volumes are small. Small left pleural effusion is present with associated minimal left basilar atelectasis. No pneumothorax. Cardiac size within normal limits. Interval T12 vertebroplasty. T11 vertebroplasty again noted. Progressive focal kyphosis at T10-L1. Stable L1 superior endplate fracture. IMPRESSION: 1. Small left pleural effusion with associated minimal left basilar atelectasis. 2. Interval T12 vertebroplasty. Progressive focal kyphosis at T10-L1. Electronically Signed   By: Helyn Numbers M.D.   On: 07/19/2023 01:33      TIMI Risk Score for Unstable Angina or Non-ST Elevation MI:   The patient's TIMI risk score is 3, which indicates a 13% risk of all cause mortality, new or recurrent myocardial infarction or need for urgent revascularization in the next 14 days.    Assessment and Plan:   NSTEMI Acute Chest Pain Presented with acute onset chest pain since last evening with a sensation of "a concrete block is on my chest".  Her  troponin trended from 8 -> 18 ->35.  ECG does not reveal any ST changes.  CT dissection protocol did not show any evidence of dissection, pulmonary embolus, pneumonia, or other acute chest abnormalities.  Altogether, her chest pain combined with troponin delta is concerning for NSTEMI, and she would benefit from heart catheterization.  She reports having a prior cath in the 1990s which is normal, but has not had a cardiac workup since that time.  She has not tolerated statins in the past due to myalgias, and her most recent LDL in 2017 was 147.  She has an allergy to NSAIDs, but tolerates taking aspirin for headaches.  She has not had a prior echocardiograph. -Heparin GTT -Aspirin 324 mg once, then 81 mg -Coronary angiography, n.p.o. except for medications -Admit to cardiology -Telemetry -Unable to tolerate statin due to myalgias, starting Zetia 10 mg daily -Echocardiograph -lipid panel, A1c, lp(a)  Hypertension --Continue home losartan 100 mg daily  Parkinsonism Resting Tremor -Continue home propranolol -Patient to bring in home domperidone  Bipolar 1 Dementia -Continue home bupropion, lamotrigine, and buspirone  Bladder spasms -Continue home oxybutynin  Osteoporosis Spinal Compression Fractures -Continue home teriparatide  Insomnia -Continue home melatonin, holding home trazodone  GERD -Continue home PPI  Severity of Illness: The appropriate patient status for this patient is INPATIENT. Inpatient status is judged to be reasonable and necessary in order to provide the required intensity of service to ensure the patient's safety. The patient's presenting symptoms, physical exam findings, and initial radiographic and laboratory data in the context of their chronic comorbidities is felt to place them at high risk for further clinical deterioration. Furthermore, it is not anticipated that the patient will be medically stable for discharge from the hospital within 2 midnights of  admission.   * I certify that at the point of admission it is my clinical judgment that the patient will require inpatient hospital care spanning beyond 2 midnights from the point of admission due to high intensity of service, high risk for further deterioration and high frequency of surveillance required.*   For questions or updates, please contact  [facility] Please consult [email or web address] for contact info under     Signed, Freddy Finner, MD  07/19/2023 5:16 AM

## 2023-07-19 NOTE — ED Provider Triage Note (Signed)
Emergency Medicine Provider Triage Evaluation Note  Audrey Brown , a 77 y.o. female with hx of HTN and Parkinson's was evaluated in triage.  Pt complains of left sided chest pain onset at 1800 with radiation to the back. Pain is constant, worse with breathing. No change with eating. C/o some associated lightheadedness. No vomiting, abdominal pain, extremity numbness or paresthesias, fevers, recent cough or URI symptoms. Had GI cocktail in triage w/o pain relief.  Review of Systems  Positive: As above Negative: As above  Physical Exam  BP (!) 155/92 (BP Location: Right Arm)   Pulse 83   Temp 98.6 F (37 C) (Oral)   Resp 18   SpO2 95%  Gen:   Awake, uncomfortable appearing Resp:  Splinting with deep breaths. Lungs CTAB. MSK:   Moves extremities without difficulty. No edema BLE. Other:  Soft abdomen without focal TTP.  Medical Decision Making  Medically screening exam initiated at 12:26 AM.  Appropriate orders placed.  [employer] was informed that the remainder of the evaluation will be completed by another provider, this initial triage assessment does not replace that evaluation, and the importance of remaining in the ED until their evaluation is complete.  Unspecified chest pain - work up initiated   Antony Madura, [place] 07/19/23 1610

## 2023-07-19 NOTE — ED Triage Notes (Addendum)
Home since 6pm having chest discomfort. Having belching, gerd, pain with deep breathe, feels better sitting up. Hx of gerd but never had episode that felt like this. EKG clean per EMS. Aspirin given by EMS. [facility]chair bound [facility]

## 2023-07-20 ENCOUNTER — Encounter (HOSPITAL_COMMUNITY): Payer: Self-pay | Admitting: Interventional Cardiology

## 2023-07-26 DIAGNOSIS — L304 Erythema intertrigo: Secondary | ICD-10-CM | POA: Diagnosis not present

## 2023-07-26 DIAGNOSIS — B001 Herpesviral vesicular dermatitis: Secondary | ICD-10-CM | POA: Diagnosis not present

## 2023-07-29 ENCOUNTER — Encounter: Payer: Self-pay | Admitting: Gastroenterology

## 2023-07-29 ENCOUNTER — Ambulatory Visit (INDEPENDENT_AMBULATORY_CARE_PROVIDER_SITE_OTHER): Payer: Medicare Other | Admitting: Gastroenterology

## 2023-07-29 VITALS — BP 130/80 | HR 70 | Ht 63.0 in | Wt 160.0 lb

## 2023-07-29 DIAGNOSIS — K5900 Constipation, unspecified: Secondary | ICD-10-CM

## 2023-07-29 DIAGNOSIS — K219 Gastro-esophageal reflux disease without esophagitis: Secondary | ICD-10-CM | POA: Diagnosis not present

## 2023-07-29 DIAGNOSIS — R0789 Other chest pain: Secondary | ICD-10-CM

## 2023-07-29 DIAGNOSIS — R1310 Dysphagia, unspecified: Secondary | ICD-10-CM

## 2023-07-29 MED ORDER — SUCRALFATE 1 G PO TABS: 1.0000 g | ORAL_TABLET | Freq: Two times a day (BID) | ORAL | 3 refills | Status: DC

## 2023-07-29 NOTE — Patient Instructions (Signed)
Start taking Miralax 1 capful (17 grams) 1x / day for 1 week.   If this is not effective, increase to 1 dose 2x / day for 1 week.   If this is still not effective, increase to two capfuls (34 grams) 2x / day.   Can adjust dose as needed based on response. Can take 1/2 cap daily, skip days, or increase per day.    We have sent the following medications to your pharmacy for you to pick up at your convenience:  Sulcrafate  You have been scheduled for an Upper GI Series at [facility]. Your appointment is on 9/30 at 10:00am. Please arrive 30 minutes prior to your test for registration. Make sure not to eat or drink anything after midnight on the night before your test. If you need to reschedule, please call radiology at [phone]. ________________________________________________________________ An upper GI series uses x rays to help diagnose problems of the upper GI tract, which includes the esophagus, stomach, and duodenum. The duodenum is the first part of the small intestine. An upper GI series is conducted by a radiology technologist or a radiologist--a doctor who specializes in x-ray imaging--at a hospital or outpatient center. While sitting or standing in front of an x-ray machine, the patient drinks barium liquid, which is often white and has a chalky consistency and taste. The barium liquid coats the lining of the upper GI tract and makes signs of disease show up more clearly on x rays. X-ray video, called fluoroscopy, is used to view the barium liquid moving through the esophagus, stomach, and duodenum. Additional x rays and fluoroscopy are performed while the patient lies on an x-ray table. To fully coat the upper GI tract with barium liquid, the technologist or radiologist may press on the abdomen or ask the patient to change position. Patients hold still in various positions, allowing the technologist or radiologist to take x rays of the upper GI tract at different angles. If a technologist  conducts the upper GI series, a radiologist will later examine the images to look for problems.  This test typically takes about 1 hour to complete. __________________________________________________________________

## 2023-07-29 NOTE — Progress Notes (Signed)
Chief Complaint: Atypical chest pain Primary GI MD: Dr. Rhea Belton  HPI: 77 year old female history of chronic GERD, gastroparesis (on domperidone), Parkinson's disease with mild dementia, bipolar disorder, S/P spinal cord stimulator 06/2023, presents for evaluation of atypical chest pain and dysphagia.  Last seen in 2023 by Mike Gip PA-C Patient reported similar symptoms at last visit and has had EGDs and barium swallows in the past (see below).  With the most recent EGD being June 2021 and patient was found to have 2 cm hiatal hernia, no stricture, empirically dilated to 18 mm at the GE junction with improvement in symptoms for few months post dilation but had recurrence of symptoms May 2022.  Patient states 2 weeks ago (9/9) she had a bout of severe chest pain and was admitted to [facility] for further workup.  EKG showed no ischemic changes but there is a mild elevation in troponin so cardiology performed left heart cath without significant findings.  Patient reports intermittent chest pain that is worse with eating.  Does not occur every day but occurs frequently throughout the week.  States this is worse when she has a bigger meal.  She also has dysphagia with solids in which she feels they get stuck in her suprasternal notch.  No issues with liquids.  She is on Nexium 40 Mg twice daily.  She also reports coughing spells that are new for her.  No association with food.  She was told by her neurologist this may be progression of her Parkinson's.  Patient is on chronic pain medication for back pain for which she takes occasionally throughout the weeks.  Excess medication she will have some constipation and she uses senna with relief.  Denies melena/hematochezia.   PREVIOUS GI WORKUP   EGD in June 2021 for complaints of dysphagia and was found to have a 2 cm hiatal hernia, no stricture, she was empirically dilated to 18 mm at the GE junction   barium swallow 2022 which showed a small hiatal  hernia and no evidence of stricture there was mild dysmotility   Last colonoscopy 2018 was negative.   Past Medical History:  Diagnosis Date   Arthritis    lower back   Bipolar disorder (HCC)    Depression    GERD (gastroesophageal reflux disease)    Glaucoma    bilateral   Hiatal hernia    Loss of vision    right eye   Parkinson disease    Poor short-term memory    Sagittal band rupture at metacarpophalangeal joint 11/2016   right long finger    Past Surgical History:  Procedure Laterality Date   ANKLE SURGERY     BALLOON DILATION N/A 10/29/2017   Procedure: BALLOON DILATION;  Surgeon: Beverley Fiedler, MD;  Location: [facility];  Service: Gastroenterology;  Laterality: N/A;   CATARACT EXTRACTION Right 08/07/2020   CERVICAL DISC SURGERY  1994   CESAREAN SECTION     CHOLECYSTECTOMY     CYST REMOVAL NECK     ESOPHAGEAL DILATION  12/19/2018   Procedure: ESOPHAGEAL DILATION;  Surgeon: Beverley Fiedler, MD;  Location: [facility];  Service: Gastroenterology;;   ESOPHAGOGASTRODUODENOSCOPY (EGD) WITH ESOPHAGEAL DILATION  07/27/2012   with Propofol   ESOPHAGOGASTRODUODENOSCOPY (EGD) WITH PROPOFOL N/A 06/23/2016   Procedure: ESOPHAGOGASTRODUODENOSCOPY (EGD) WITH PROPOFOL;  Surgeon: Beverley Fiedler, MD;  Location: [facility];  Service: Gastroenterology;  Laterality: N/A;   ESOPHAGOGASTRODUODENOSCOPY (EGD) WITH PROPOFOL N/A 10/29/2017   Procedure: ESOPHAGOGASTRODUODENOSCOPY (EGD) WITH PROPOFOL;  Surgeon: Rhea Belton,  Carie Caddy, MD;  Location: [facility];  Service: Gastroenterology;  Laterality: N/A;   ESOPHAGOGASTRODUODENOSCOPY (EGD) WITH PROPOFOL N/A 12/19/2018   Procedure: ESOPHAGOGASTRODUODENOSCOPY (EGD) WITH PROPOFOL;  Surgeon: Beverley Fiedler, MD;  Location: [facility];  Service: Gastroenterology;  Laterality: N/A;   EYE SURGERY     EYE SURGERY  09/2019   R eye procedure to relieve pressure   EYE SURGERY     FOOT NEUROMA SURGERY Left    LEFT HEART CATH AND CORONARY ANGIOGRAPHY N/A  07/19/2023   Procedure: LEFT HEART CATH AND CORONARY ANGIOGRAPHY;  Surgeon: Corky Crafts, MD;  Location: [facility] CV [facility];  Service: Cardiovascular;  Laterality: N/A;   ORIF DISTAL RADIUS FRACTURE Right 07/04/2010   SAVORY DILATION N/A 06/23/2016   Procedure: SAVORY DILATION;  Surgeon: Beverley Fiedler, MD;  Location: [facility];  Service: Gastroenterology;  Laterality: N/A;   TENDON REPAIR Right 12/01/2016   Procedure: right long finger ulnar sagittal band reconstruction;  Surgeon: Betha Loa, MD;  Location: [facility];  Service: Orthopedics;  Laterality: Right;   TONSILLECTOMY      Current Outpatient Medications  Medication Sig Dispense Refill   buPROPion (WELLBUTRIN XL) 300 MG 24 hr tablet Take 300 mg by mouth daily.      busPIRone (BUSPAR) 10 MG tablet Take 10 mg by mouth 2 (two) times daily.      Cholecalciferol (VITAMIN D) 50 MCG (2000 UT) CAPS Take 2,000 Units by mouth daily.     ibuprofen (ADVIL) 800 MG tablet TAKE 1 TABLET EVERY 8 HOURS AS NEEDED FOR PAIN 270 tablet 3   LamoTRIgine XR (LAMICTAL XR) 200 MG TB24 Take 1 tablet (200 mg total) by mouth daily. 90 tablet 1   losartan (COZAAR) 100 MG tablet Take 1 tablet (100 mg total) by mouth daily. 90 tablet 3   Melatonin 5 MG CAPS Take 5 mg by mouth at bedtime.     methocarbamol (ROBAXIN) 500 MG tablet Take 250-500 mg by mouth 2 (two) times daily as needed for muscle spasms.     oxybutynin (DITROPAN-XL) 10 MG 24 hr tablet TAKE 1 TABLET AT BEDTIME 90 tablet 3   promethazine (PHENERGAN) 25 MG tablet TAKE 1 TABLET EVERY 4 HOURS AS NEEDED FOR NAUSEA 180 tablet 3   propranolol (INDERAL) 10 MG tablet Take 20 mg by mouth 2 (two) times daily.     senna (SENOKOT) 8.6 MG TABS tablet Take 1 tablet by mouth at bedtime.     Teriparatide, Recombinant, (FORTEO) 600 MCG/2.4ML SOPN Inject 2.4 mLs into the skin daily.     albuterol (VENTOLIN HFA) 108 (90 Base) MCG/ACT inhaler Inhale 2 puffs into the lungs every 4 (four) hours as  needed for wheezing or shortness of breath. 18 g 3   AMBULATORY NON FORMULARY MEDICATION Take 10 mg by mouth 3 (three) times daily. Medication Name: Domperidone 10 mg 270 capsule 3   aspirin EC 81 MG tablet Take 1 tablet (81 mg total) by mouth daily. Swallow whole.     cyanocobalamin (VITAMIN B12) 1000 MCG tablet Take 1,000 mcg by mouth daily.     esomeprazole (NEXIUM) 40 MG capsule Take 1 capsule (40 mg total) by mouth 2 (two) times daily before a meal. (Patient not taking: Reported on 07/29/2023) 180 capsule 3   Magnesium 500 MG CAPS Take 400 mg by mouth at bedtime.  (Patient not taking: Reported on 07/29/2023)     traMADol (ULTRAM) 50 MG tablet TAKE 2 TABLETS EVERY 6 HOURS AS  NEEDED FOR MODERATE PAIN 720 tablet 1   traZODone (DESYREL) 100 MG tablet Take 100 mg by mouth at bedtime.     No current facility-administered medications for this visit.    Allergies as of 07/29/2023 - Review Complete 07/29/2023  Allergen Reaction Noted   Penicillins Hives, Nausea And Vomiting, Nausea Only, Rash, and Other (See Comments) 03/03/2005   Celebrex [celecoxib] Hives 07/31/2010   Morphine and codeine Hives 06/16/2011   Risperdal [risperidone] Other (See Comments) 04/10/2013   Statins Other (See Comments) 09/17/2011   Lisinopril Cough 06/16/2021   Hydrocodone Hives 10/16/2021    Family History  Problem Relation Age of Onset   Emphysema Mother    Heart attack Father    Hypertension Other    Lung cancer Other    Colon cancer Neg Hx    Esophageal cancer Neg Hx    Rectal cancer Neg Hx    Stomach cancer Neg Hx     Social History   Socioeconomic History   Marital status: Widowed    Spouse name: Not on file   Number of children: 4   Years of education: 6 years college   Highest education level: Master's degree (e.g., MA, MS, MEng, MEd, MSW, MBA)  Occupational History   Occupation: Retired    Associate Professor: RETIRED    Comment: [employer]  Tobacco Use   Smoking status: Never   Smokeless tobacco: Never   Vaping Use   Vaping status: Never Used  Substance and Sexual Activity   Alcohol use: No    Alcohol/week: 0.0 standard drinks of alcohol   Drug use: No   Sexual activity: Not Currently  Other Topics Concern   Not on file  Social History Narrative   Daughter is her POA and must come to all visits in GI and [facility]       Patient is right-handed. She lives with her daughter and granddaughter in a split level home with a chair lift. Uses a power chair. She drinks 3-4 cups of coffee a day.      Has 4 daughters, 3 grand children, and 5 great-grand children.   Social Determinants of Health   Financial Resource Strain: Low Risk  (03/19/2023)   Overall Financial Resource Strain (CARDIA)    Difficulty of Paying Living Expenses: Not hard at all  Food Insecurity: No Food Insecurity (03/19/2023)   Hunger Vital Sign    Worried About Running Out of Food in the Last Year: Never true    Ran Out of Food in the Last Year: Never true  Transportation Needs: No Transportation Needs (03/19/2023)   PRAPARE - Administrator, Civil Service (Medical): No    Lack of Transportation (Non-Medical): No  Physical Activity: Inactive (03/19/2023)   Exercise Vital Sign    Days of Exercise per Week: 0 days    Minutes of Exercise per Session: 0 min  Stress: No Stress Concern Present (03/19/2023)   [employer] of Occupational Health - Occupational Stress Questionnaire    Feeling of Stress : Not at all  Social Connections: Moderately Integrated (03/19/2023)   Social Connection and Isolation Panel [NHANES]    Frequency of Communication with Friends and Family: More than three times a week    Frequency of Social Gatherings with Friends and Family: More than three times a week    Attends Religious Services: More than 4 times per year    Active Member of [employer] or Organizations: Yes    Attends Banker Meetings: More  than 4 times per year    Marital Status: Widowed  Intimate Partner Violence: Not  At Risk (03/19/2023)   Humiliation, Afraid, Rape, and Kick questionnaire    Fear of Current or Ex-Partner: No    Emotionally Abused: No    Physically Abused: No    Sexually Abused: No    Review of Systems:    Constitutional: No weight loss, fever, chills, weakness or fatigue HEENT: Eyes: No change in vision               Ears, Nose, Throat:  No change in hearing or congestion Skin: No rash or itching Cardiovascular: No chest pain, chest pressure or palpitations   Respiratory: No SOB or cough Gastrointestinal: See HPI and otherwise negative Genitourinary: No dysuria or change in urinary frequency Neurological: No headache, dizziness or syncope Musculoskeletal: No new muscle or joint pain Hematologic: No bleeding or bruising Psychiatric: No history of depression or anxiety    Physical Exam:  Vital signs: BP 130/80   Pulse 70   Ht 5\' 3"  (1.6 m)   Wt 72.6 kg   BMI 28.34 kg/m   Constitutional: NAD, Well developed, Well nourished, alert and cooperative in motorized wheelchair with essential tremor noted. Daughter is present Head:  Normocephalic and atraumatic. Eyes:   PEERL, EOMI. No icterus. Conjunctiva pink. Respiratory: Respirations even and unlabored. Lungs clear to auscultation bilaterally.   No wheezes, crackles, or rhonchi.  Cardiovascular:  Regular rate and rhythm. No peripheral edema, cyanosis or pallor.  Gastrointestinal:  Soft, nondistended, nontender. No rebound or guarding. Normal bowel sounds. No appreciable masses or hepatomegaly. Rectal:  Not performed.  Msk:  Symmetrical without gross deformities. Without edema, no deformity or joint abnormality.  Skin:   Dry and intact without significant lesions or rashes. Psychiatric: Oriented to person, place and time. Demonstrates good judgement and reason without abnormal affect or behaviors.   RELEVANT LABS AND IMAGING: CBC    Component Value Date/Time   WBC 10.0 07/19/2023 0012   RBC 4.38 07/19/2023 0012   HGB 13.0  07/19/2023 0012   HCT 39.2 07/19/2023 0012   PLT 330 07/19/2023 0012   MCV 89.5 07/19/2023 0012   MCH 29.7 07/19/2023 0012   MCHC 33.2 07/19/2023 0012   RDW 12.7 07/19/2023 0012   LYMPHSABS 1.9 07/19/2023 0012   MONOABS 0.9 07/19/2023 0012   EOSABS 0.3 07/19/2023 0012   BASOSABS 0.1 07/19/2023 0012    CMP     Component Value Date/Time   NA 135 07/19/2023 0012   K 4.4 07/19/2023 0012   CL 102 07/19/2023 0012   CO2 22 07/19/2023 0012   GLUCOSE 110 (H) 07/19/2023 0012   BUN 20 07/19/2023 0012   CREATININE 1.08 (H) 07/19/2023 0012   CALCIUM 9.0 07/19/2023 0012   PROT 6.9 07/19/2023 0012   ALBUMIN 3.7 07/19/2023 0012   AST 21 07/19/2023 0012   ALT 15 07/19/2023 0012   ALKPHOS 62 07/19/2023 0012   BILITOT 0.5 07/19/2023 0012   GFRNONAA 54 (L) 07/19/2023 0012   GFRAA >60 04/20/2017 0518     Assessment/Plan:   77 year old female history of Parkinson's, gastroparesis, chronic GERD, dysphagia s/p empiric dilation 2021, presents for atypical chest pain and dysphagia after recent admission concerning for possible NSTEMI with negative left heart cath.  Dysphagia, unspecified type Gastroesophageal reflux disease, unspecified whether esophagitis present Atypical chest pain Not well-controlled on Nexium 40 Mg twice daily.  Dysphagia to solids intermittently with mild improvement after empiric dilation in 2021 for  a few months and barium swallow in 2022 showing some dysmotility.  Patient would be high risk to undergo EGD at this time with recent hospitalization for chest pain with left heart cath in addition to other comorbidities.  Suspect worsening symptoms could also be sequelae of her progressive Parkinson's. - Addition of Carafate twice daily.  Advised of constipation side effects and recommended using MiraLAX to combat if side effects present. - Barium swallow with tablet for further evaluation - Continue Nexium 40 Mg twice daily.  Can use Pepcid as needed - Educated patient on  lifestyle modifications and provided patient education handouts - Follow-up in 8 to 12 weeks with Dr. Rhea Belton for decision of whether or not to pursue EGD  Constipation, unspecified constipation type Worsened by chronic pain medication and history of gastroparesis as well as barrier of immobility with her history of Parkinson's and requiring wheelchair.  Overall well-controlled with senna as needed. - Add in MiraLAX 1 capful per day as needed - Increase water and increase fiber  Donz[facility] 07/29/2023, 9:36 AM  Cc: [name], MD

## 2023-08-02 NOTE — Progress Notes (Signed)
Addendum: Reviewed and agree with assessment and management plan. Kadijah Shamoon M, MD  

## 2023-08-06 ENCOUNTER — Encounter: Payer: Self-pay | Admitting: Physician Assistant

## 2023-08-06 ENCOUNTER — Ambulatory Visit: Payer: Medicare Other | Attending: Physician Assistant | Admitting: Physician Assistant

## 2023-08-06 VITALS — BP 152/90 | HR 68 | Ht 63.0 in | Wt 165.0 lb

## 2023-08-06 DIAGNOSIS — F3160 Bipolar disorder, current episode mixed, unspecified: Secondary | ICD-10-CM | POA: Diagnosis not present

## 2023-08-06 DIAGNOSIS — I10 Essential (primary) hypertension: Secondary | ICD-10-CM | POA: Diagnosis not present

## 2023-08-06 DIAGNOSIS — I2510 Atherosclerotic heart disease of native coronary artery without angina pectoris: Secondary | ICD-10-CM | POA: Diagnosis not present

## 2023-08-06 DIAGNOSIS — M8000XG Age-related osteoporosis with current pathological fracture, unspecified site, subsequent encounter for fracture with delayed healing: Secondary | ICD-10-CM | POA: Insufficient documentation

## 2023-08-06 DIAGNOSIS — G3185 Corticobasal degeneration: Secondary | ICD-10-CM | POA: Diagnosis not present

## 2023-08-06 DIAGNOSIS — G20A1 Parkinson's disease without dyskinesia, without mention of fluctuations: Secondary | ICD-10-CM | POA: Insufficient documentation

## 2023-08-06 DIAGNOSIS — F0280 Dementia in other diseases classified elsewhere without behavioral disturbance: Secondary | ICD-10-CM | POA: Insufficient documentation

## 2023-08-06 NOTE — Patient Instructions (Signed)
Medication Instructions:  Your physician recommends that you continue on your current medications as directed. Please refer to the Current Medication list given to you today.  *If you need a refill on your cardiac medications before your next appointment, please call your pharmacy*  Lab Work: Have your primary care provider draw a lipid panel at your next visit. If you have labs (blood work) drawn today and your tests are completely normal, you will receive your results only by: MyChart Message (if you have MyChart) OR A paper copy in the mail If you have any lab test that is abnormal or we need to change your treatment, we will call you to review the results.  Follow-Up: At [facility], you and your health needs are our priority.  As part of our continuing mission to provide you with exceptional heart care, we have created designated Provider Care Teams.  These Care Teams include your primary Cardiologist (physician) and Advanced Practice Providers (APPs -  Physician Assistants and Nurse Practitioners) who all work together to provide you with the care you need, when you need it.  Your next appointment:   6 month(s)  Provider:   Tonny Bollman, MD     Low-Sodium Eating Plan Salt (sodium) helps you keep a healthy balance of fluids in your body. Too much sodium can raise your blood pressure. It can also cause fluid and waste to be held in your body. Your health care provider or dietitian may recommend a low-sodium eating plan if you have high blood pressure (hypertension), kidney disease, liver disease, or heart failure. Eating less sodium can help lower your blood pressure and reduce swelling. It can also protect your heart, liver, and kidneys. What are tips for following this plan? Reading food labels  Check food labels for the amount of sodium per serving. If you eat more than one serving, you must multiply the listed amount by the number of servings. Choose foods with less  than 140 milligrams (mg) of sodium per serving. Avoid foods with 300 mg of sodium or more per serving. Always check how much sodium is in a product, even if the label says "unsalted" or "no salt added." Shopping  Buy products labeled as "low-sodium" or "no salt added." Buy fresh foods. Avoid canned foods and pre-made or frozen meals. Avoid canned, cured, or processed meats. Buy breads that have less than 80 mg of sodium per slice. Cooking  Eat more home-cooked food. Try to eat less restaurant, buffet, and fast food. Try not to add salt when you cook. Use salt-free seasonings or herbs instead of table salt or sea salt. Check with your provider or pharmacist before using salt substitutes. Cook with plant-based oils, such as canola, sunflower, or olive oil. Meal planning When eating at a restaurant, ask if your food can be made with less salt or no salt. Avoid dishes labeled as brined, pickled, cured, or smoked. Avoid dishes made with soy sauce, miso, or teriyaki sauce. Avoid foods that have monosodium glutamate (MSG) in them. MSG may be added to some restaurant food, sauces, soups, bouillon, and canned foods. Make meals that can be grilled, baked, poached, roasted, or steamed. These are often made with less sodium. General information Try to limit your sodium intake to 1,500-2,300 mg each day, or the amount told by your provider. What foods should I eat? Fruits Fresh, frozen, or canned fruit. Fruit juice. Vegetables Fresh or frozen vegetables. "No salt added" canned vegetables. "No salt added" tomato sauce and  paste. Low-sodium or reduced-sodium tomato and vegetable juice. Grains Low-sodium cereals, such as oats, puffed wheat and rice, and shredded wheat. Low-sodium crackers. Unsalted rice. Unsalted pasta. Low-sodium bread. Whole grain breads and whole grain pasta. Meats and other proteins Fresh or frozen meat, poultry, seafood, and fish. These should have no added salt. Low-sodium canned  tuna and salmon. Unsalted nuts. Dried peas, beans, and lentils without added salt. Unsalted canned beans. Eggs. Unsalted nut butters. Dairy Milk. Soy milk. Cheese that is naturally low in sodium, such as ricotta cheese, fresh mozzarella, or Swiss cheese. Low-sodium or reduced-sodium cheese. Cream cheese. Yogurt. Seasonings and condiments Fresh and dried herbs and spices. Salt-free seasonings. Low-sodium mustard and ketchup. Sodium-free salad dressing. Sodium-free light mayonnaise. Fresh or refrigerated horseradish. Lemon juice. Vinegar. Other foods Homemade, reduced-sodium, or low-sodium soups. Unsalted popcorn and pretzels. Low-salt or salt-free chips. The items listed above may not be all the foods and drinks you can have. Talk to a dietitian to learn more. What foods should I avoid? Vegetables Sauerkraut, pickled vegetables, and relishes. Olives. [place] fries. Onion rings. Regular canned vegetables, except low-sodium or reduced-sodium items. Regular canned tomato sauce and paste. Regular tomato and vegetable juice. Frozen vegetables in sauces. Grains Instant hot cereals. Bread stuffing, pancake, and biscuit mixes. Croutons. Seasoned rice or pasta mixes. Noodle soup cups. Boxed or frozen macaroni and cheese. Regular salted crackers. Self-rising flour. Meats and other proteins Meat or fish that is salted, canned, smoked, spiced, or pickled. Precooked or cured meat, such as sausages or meat loaves. Tomasa Blase. Ham. Pepperoni. Hot dogs. Corned beef. Chipped beef. Salt pork. Jerky. Pickled herring, anchovies, and sardines. Regular canned tuna. Salted nuts. Dairy Processed cheese and cheese spreads. Hard cheeses. Cheese curds. Blue cheese. Feta cheese. String cheese. Regular cottage cheese. Buttermilk. Canned milk. Fats and oils Salted butter. Regular margarine. Ghee. Bacon fat. Seasonings and condiments Onion salt, garlic salt, seasoned salt, table salt, and sea salt. Canned and packaged gravies.  Worcestershire sauce. Tartar sauce. Barbecue sauce. Teriyaki sauce. Soy sauce, including reduced-sodium soy sauce. Steak sauce. Fish sauce. Oyster sauce. Cocktail sauce. Horseradish that you find on the shelf. Regular ketchup and mustard. Meat flavorings and tenderizers. Bouillon cubes. Hot sauce. Pre-made or packaged marinades. Pre-made or packaged taco seasonings. Relishes. Regular salad dressings. Salsa. Other foods Salted popcorn and pretzels. Corn chips and puffs. Potato and tortilla chips. Canned or dried soups. Pizza. Frozen entrees and pot pies. The items listed above may not be all the foods and drinks you should avoid. Talk to a dietitian to learn more. This information is not intended to replace advice given to you by your health care provider. Make sure you discuss any questions you have with your health care provider. Document Revised: 11/12/2022 Document Reviewed: 11/12/2022 Elsevier Patient Education  2024 [employer] Heart-Healthy Eating Plan Many factors influence your heart health, including eating and exercise habits. Heart health is also called coronary health. Coronary risk increases with abnormal blood fat (lipid) levels. A heart-healthy eating plan includes limiting unhealthy fats, increasing healthy fats, limiting salt (sodium) intake, and making other diet and lifestyle changes. What is my plan? Your health care provider may recommend that: You limit your fat intake to _________% or less of your total calories each day. You limit your saturated fat intake to _________% or less of your total calories each day. You limit the amount of cholesterol in your diet to less than _________ mg per day. You limit the amount of sodium in your diet to less  than _________ mg per day. What are tips for following this plan? Cooking Cook foods using methods other than frying. Baking, boiling, grilling, and broiling are all good options. Other ways to reduce fat include: Removing the skin  from poultry. Removing all visible fats from meats. Steaming vegetables in water or broth. Meal planning  At meals, imagine dividing your plate into fourths: Fill one-half of your plate with vegetables and green salads. Fill one-fourth of your plate with whole grains. Fill one-fourth of your plate with lean protein foods. Eat 2-4 cups of vegetables per day. One cup of vegetables equals 1 cup (91 g) broccoli or cauliflower florets, 2 medium carrots, 1 large bell pepper, 1 large sweet potato, 1 large tomato, 1 medium white potato, 2 cups (150 g) raw leafy greens. Eat 1-2 cups of fruit per day. One cup of fruit equals 1 small apple, 1 large banana, 1 cup (237 g) mixed fruit, 1 large orange,  cup (82 g) dried fruit, 1 cup (240 mL) 100% fruit juice. Eat more foods that contain soluble fiber. Examples include apples, broccoli, carrots, beans, peas, and barley. Aim to get 25-30 g of fiber per day. Increase your consumption of legumes, nuts, and seeds to 4-5 servings per week. One serving of dried beans or legumes equals  cup (90 g) cooked, 1 serving of nuts is  oz (12 almonds, 24 pistachios, or 7 walnut halves), and 1 serving of seeds equals  oz (8 g). Fats Choose healthy fats more often. Choose monounsaturated and polyunsaturated fats, such as olive and canola oils, avocado oil, flaxseeds, walnuts, almonds, and seeds. Eat more omega-3 fats. Choose salmon, mackerel, sardines, tuna, flaxseed oil, and ground flaxseeds. Aim to eat fish at least 2 times each week. Check food labels carefully to identify foods with trans fats or high amounts of saturated fat. Limit saturated fats. These are found in animal products, such as meats, butter, and cream. Plant sources of saturated fats include palm oil, palm kernel oil, and coconut oil. Avoid foods with partially hydrogenated oils in them. These contain trans fats. Examples are stick margarine, some tub margarines, cookies, crackers, and other baked  goods. Avoid fried foods. General information Eat more home-cooked food and less restaurant, buffet, and fast food. Limit or avoid alcohol. Limit foods that are high in added sugar and simple starches such as foods made using white refined flour (white breads, pastries, sweets). Lose weight if you are overweight. Losing just 5-10% of your body weight can help your overall health and prevent diseases such as diabetes and heart disease. Monitor your sodium intake, especially if you have high blood pressure. Talk with your health care provider about your sodium intake. Try to incorporate more vegetarian meals weekly. What foods should I eat? Fruits All fresh, canned (in natural juice), or frozen fruits. Vegetables Fresh or frozen vegetables (raw, steamed, roasted, or grilled). Green salads. Grains Most grains. Choose whole wheat and whole grains most of the time. Rice and pasta, including brown rice and pastas made with whole wheat. Meats and other proteins Lean, well-trimmed beef, veal, pork, and lamb. Chicken and [place] without skin. All fish and shellfish. Wild duck, rabbit, pheasant, and venison. Egg whites or low-cholesterol egg substitutes. Dried beans, peas, lentils, and tofu. Seeds and most nuts. Dairy Low-fat or nonfat cheeses, including ricotta and mozzarella. Skim or 1% milk (liquid, powdered, or evaporated). Buttermilk made with low-fat milk. Nonfat or low-fat yogurt. Fats and oils Non-hydrogenated (trans-free) margarines. Vegetable oils, including soybean, sesame,  sunflower, olive, avocado, peanut, safflower, corn, canola, and cottonseed. Salad dressings or mayonnaise made with a vegetable oil. Beverages Water (mineral or sparkling). Coffee and tea. Unsweetened ice tea. Diet beverages. Sweets and desserts Sherbet, gelatin, and fruit ice. Small amounts of dark chocolate. Limit all sweets and desserts. Seasonings and condiments All seasonings and condiments. The items listed  above may not be a complete list of foods and beverages you can eat. Contact a dietitian for more options. What foods should I avoid? Fruits Canned fruit in heavy syrup. Fruit in cream or butter sauce. Fried fruit. Limit coconut. Vegetables Vegetables cooked in cheese, cream, or butter sauce. Fried vegetables. Grains Breads made with saturated or trans fats, oils, or whole milk. Croissants. Sweet rolls. Donuts. High-fat crackers, such as cheese crackers and chips. Meats and other proteins Fatty meats, such as hot dogs, ribs, sausage, bacon, rib-eye roast or steak. High-fat deli meats, such as salami and bologna. Caviar. Domestic duck and goose. Organ meats, such as liver. Dairy Cream, sour cream, cream cheese, and creamed cottage cheese. Whole-milk cheeses. Whole or 2% milk (liquid, evaporated, or condensed). Whole buttermilk. Cream sauce or high-fat cheese sauce. Whole-milk yogurt. Fats and oils Meat fat, or shortening. Cocoa butter, hydrogenated oils, palm oil, coconut oil, palm kernel oil. Solid fats and shortenings, including bacon fat, salt pork, lard, and butter. Nondairy cream substitutes. Salad dressings with cheese or sour cream. Beverages Regular sodas and any drinks with added sugar. Sweets and desserts Frosting. Pudding. Cookies. Cakes. Pies. Milk chocolate or white chocolate. Buttered syrups. Full-fat ice cream or ice cream drinks. The items listed above may not be a complete list of foods and beverages to avoid. Contact a dietitian for more information. Summary Heart-healthy meal planning includes limiting unhealthy fats, increasing healthy fats, limiting salt (sodium) intake and making other diet and lifestyle changes. Lose weight if you are overweight. Losing just 5-10% of your body weight can help your overall health and prevent diseases such as diabetes and heart disease. Focus on eating a balance of foods, including fruits and vegetables, low-fat or nonfat dairy, lean  protein, nuts and legumes, whole grains, and heart-healthy oils and fats. This information is not intended to replace advice given to you by your health care provider. Make sure you discuss any questions you have with your health care provider. Document Revised: 12/01/2021 Document Reviewed: 12/01/2021 Elsevier Patient Education  2024 [employer].

## 2023-08-09 ENCOUNTER — Ambulatory Visit (HOSPITAL_COMMUNITY): Payer: Medicare Other

## 2023-08-09 ENCOUNTER — Ambulatory Visit: Payer: Medicare Other | Admitting: Physician Assistant

## 2023-08-09 DIAGNOSIS — M47816 Spondylosis without myelopathy or radiculopathy, lumbar region: Secondary | ICD-10-CM | POA: Diagnosis not present

## 2023-08-09 DIAGNOSIS — I10 Essential (primary) hypertension: Secondary | ICD-10-CM | POA: Diagnosis not present

## 2023-08-09 DIAGNOSIS — G894 Chronic pain syndrome: Secondary | ICD-10-CM | POA: Diagnosis not present

## 2023-08-09 DIAGNOSIS — Z4542 Encounter for adjustment and management of neuropacemaker (brain) (peripheral nerve) (spinal cord): Secondary | ICD-10-CM | POA: Diagnosis not present

## 2023-08-10 ENCOUNTER — Other Ambulatory Visit (HOSPITAL_COMMUNITY): Payer: Medicare Other

## 2023-08-10 ENCOUNTER — Ambulatory Visit (HOSPITAL_COMMUNITY)
Admission: RE | Admit: 2023-08-10 | Discharge: 2023-08-10 | Disposition: A | Discharge status: Home / Self Care | Payer: Medicare Other | Source: Ambulatory Visit | Attending: Gastroenterology | Admitting: Gastroenterology

## 2023-08-10 DIAGNOSIS — K219 Gastro-esophageal reflux disease without esophagitis: Secondary | ICD-10-CM | POA: Insufficient documentation

## 2023-08-10 DIAGNOSIS — R1310 Dysphagia, unspecified: Secondary | ICD-10-CM | POA: Insufficient documentation

## 2023-08-10 DIAGNOSIS — R0789 Other chest pain: Secondary | ICD-10-CM | POA: Insufficient documentation

## 2023-08-18 ENCOUNTER — Telehealth: Payer: Self-pay

## 2023-08-18 NOTE — Telephone Encounter (Signed)
Received refill request for Domperidone 10mg   one tablet three times daily from [facility] in [place].  Is this OK to refill?

## 2023-08-19 DIAGNOSIS — Z4542 Encounter for adjustment and management of neuropacemaker (brain) (peripheral nerve) (spinal cord): Secondary | ICD-10-CM | POA: Diagnosis not present

## 2023-08-19 MED ORDER — AMBULATORY NON FORMULARY MEDICATION: 10.0000 mg | Freq: Three times a day (TID) | 3 refills | Status: DC

## 2023-08-19 NOTE — Telephone Encounter (Signed)
Rx for Domperidone printed and faxed to [facility] as requested.

## 2023-08-19 NOTE — Addendum Note (Signed)
Addended by: Illene Bolus on: 08/19/2023 03:26 PM   Modules accepted: Orders

## 2023-08-20 ENCOUNTER — Encounter: Payer: Self-pay | Admitting: Family Medicine

## 2023-08-20 ENCOUNTER — Ambulatory Visit: Payer: Medicare Other | Admitting: Family Medicine

## 2023-08-20 VITALS — BP 110/64 | HR 73 | Temp 99.7°F | Wt 170.0 lb

## 2023-08-20 DIAGNOSIS — J029 Acute pharyngitis, unspecified: Secondary | ICD-10-CM

## 2023-08-20 DIAGNOSIS — J40 Bronchitis, not specified as acute or chronic: Secondary | ICD-10-CM

## 2023-08-20 DIAGNOSIS — R0989 Other specified symptoms and signs involving the circulatory and respiratory systems: Secondary | ICD-10-CM

## 2023-08-20 LAB — POC COVID19 BINAXNOW: SARS Coronavirus 2 Ag: NEGATIVE

## 2023-08-20 LAB — POCT INFLUENZA A/B
Influenza A, POC: NEGATIVE
Influenza B, POC: NEGATIVE

## 2023-08-20 LAB — POCT RAPID STREP A (OFFICE): Rapid Strep A Screen: NEGATIVE

## 2023-08-20 MED ORDER — LEVOFLOXACIN 500 MG PO TABS: 500.0000 mg | ORAL_TABLET | Freq: Every day | ORAL | 0 refills | Status: AC

## 2023-08-20 MED ORDER — METHYLPREDNISOLONE 4 MG PO TBPK: ORAL_TABLET | ORAL | 0 refills | Status: DC

## 2023-08-20 NOTE — Progress Notes (Signed)
   Subjective:    Patient ID: Audrey Brown, female    DOB: 08/17/1946, 78 y.o.   MRN: [id number]  HPI Here for 6 days of low grade fevers, dry cough, SOB, and ST. Using Robitussin DM.    Review of Systems  Constitutional:  Positive for fever.  HENT:  Positive for congestion and sore throat. Negative for ear pain, postnasal drip and sinus pain.   Eyes: Negative.   Respiratory:  Positive for cough, shortness of breath and wheezing.        Objective:   Physical Exam Constitutional:      Appearance: She is ill-appearing.     Comments: Coughing frequently   HENT:     Right Ear: Tympanic membrane, ear canal and external ear normal.     Left Ear: Tympanic membrane, ear canal and external ear normal.     Nose: Nose normal.     Mouth/Throat:     Pharynx: Oropharynx is clear.  Eyes:     Conjunctiva/sclera: Conjunctivae normal.  Pulmonary:     Effort: Pulmonary effort is normal.     Breath sounds: Wheezing and rhonchi present. No rales.  Lymphadenopathy:     Cervical: No cervical adenopathy.  Neurological:     Mental Status: She is alert.           Assessment & Plan:  Bronchitis, treat with 10 days of Levaquin and a Medrol dose pack.  Gershon Crane, MD

## 2023-09-07 DIAGNOSIS — B001 Herpesviral vesicular dermatitis: Secondary | ICD-10-CM | POA: Diagnosis not present

## 2023-09-08 ENCOUNTER — Other Ambulatory Visit: Payer: Self-pay | Admitting: Family Medicine

## 2023-09-08 DIAGNOSIS — Z9689 Presence of other specified functional implants: Secondary | ICD-10-CM | POA: Diagnosis not present

## 2023-09-15 DIAGNOSIS — L309 Dermatitis, unspecified: Secondary | ICD-10-CM | POA: Diagnosis not present

## 2023-10-01 ENCOUNTER — Other Ambulatory Visit: Payer: Self-pay | Admitting: Physician Assistant

## 2023-10-19 ENCOUNTER — Ambulatory Visit: Payer: Medicare Other | Admitting: Internal Medicine

## 2023-10-19 ENCOUNTER — Encounter: Payer: Self-pay | Admitting: Internal Medicine

## 2023-10-19 VITALS — BP 118/70 | HR 68 | Ht 63.0 in

## 2023-10-19 DIAGNOSIS — R109 Unspecified abdominal pain: Secondary | ICD-10-CM | POA: Diagnosis not present

## 2023-10-19 DIAGNOSIS — K3184 Gastroparesis: Secondary | ICD-10-CM

## 2023-10-19 DIAGNOSIS — R1319 Other dysphagia: Secondary | ICD-10-CM

## 2023-10-19 DIAGNOSIS — R1310 Dysphagia, unspecified: Secondary | ICD-10-CM

## 2023-10-19 DIAGNOSIS — G20C Parkinsonism, unspecified: Secondary | ICD-10-CM

## 2023-10-19 DIAGNOSIS — K5900 Constipation, unspecified: Secondary | ICD-10-CM | POA: Diagnosis not present

## 2023-10-19 DIAGNOSIS — K219 Gastro-esophageal reflux disease without esophagitis: Secondary | ICD-10-CM | POA: Diagnosis not present

## 2023-10-19 MED ORDER — HYOSCYAMINE SULFATE 0.125 MG SL SUBL
0.1250 mg | SUBLINGUAL_TABLET | Freq: Four times a day (QID) | SUBLINGUAL | 0 refills | Status: DC | PRN
Start: 2023-10-19 — End: 2023-11-15

## 2023-10-19 NOTE — Progress Notes (Signed)
Patient ID: Audrey Brown, female   DOB: 1945-11-25, 77 y.o.   MRN: [id number] HPI: Discussed the use of AI scribe software for clinical note transcription with the patient, who gave verbal consent to proceed.  History of Present Illness   Audrey Brown, a 77 year old individual with a complex medical history including GERD, hiatal hernia, esophageal dysphagia responsive to dilation, gastroparesis, IBS, and Parkinson's disease, presents with ongoing issues related to esophageal motility and swallowing. She reports difficulty swallowing certain foods, particularly breads, and has noticed an increase in the number of medications she is taking. She also reports that she has been sticking to a diet of relatively soft foods and liquids, which she finds easier to swallow. She notes that warm liquids seem to help when she is having a particularly difficult time swallowing.  She also reports experiencing some heartburn, despite taking Nexium twice a day. She has been taking Carafate as needed, which seems to help. She has been experiencing quite a bit of nausea, particularly in the mornings, and has not been able to identify any specific triggers. She also reports feeling full quickly after only a few bites of food. She has noticed weight loss, likely due to decreased food intake.  She also reports a history of a significant fall in May of the previous year, which resulted in a cracked vertebrae. This led to the placement of a permanent stimulator, which has helped with pain but not completely eliminated it. She has been able to reduce her use of Tramadol since the stimulator was placed. She reports worsening tremors in her legs, which she feels are becoming more of a fall risk. She is able to bear weight and transfer with assistance, but her mobility is limited.  She has been taking Senna at bedtime for bowel movements, and reports that this is working well for her. She also reports that she has been taking  Levsin for stomach cramps, which seems to help. She expresses interest in possibly having her esophagus restretched to help alleviate some of her symptoms.      Past Medical History:  Diagnosis Date   Arthritis    lower back   Bipolar disorder (HCC)    Depression    GERD (gastroesophageal reflux disease)    Glaucoma    bilateral   Hiatal hernia    Loss of vision    right eye   Parkinson disease (HCC)    Poor short-term memory    Sagittal band rupture at metacarpophalangeal joint 11/2016   right long finger    Past Surgical History:  Procedure Laterality Date   ANKLE SURGERY     BALLOON DILATION N/A 10/29/2017   Procedure: BALLOON DILATION;  Surgeon: Beverley Fiedler, MD;  Location: [facility];  Service: Gastroenterology;  Laterality: N/A;   CATARACT EXTRACTION Right 08/07/2020   CERVICAL DISC SURGERY  1994   CESAREAN SECTION     CHOLECYSTECTOMY     CYST REMOVAL NECK     ESOPHAGEAL DILATION  12/19/2018   Procedure: ESOPHAGEAL DILATION;  Surgeon: Beverley Fiedler, MD;  Location: [facility];  Service: Gastroenterology;;   ESOPHAGOGASTRODUODENOSCOPY (EGD) WITH ESOPHAGEAL DILATION  07/27/2012   with Propofol   ESOPHAGOGASTRODUODENOSCOPY (EGD) WITH PROPOFOL N/A 06/23/2016   Procedure: ESOPHAGOGASTRODUODENOSCOPY (EGD) WITH PROPOFOL;  Surgeon: Beverley Fiedler, MD;  Location: [facility];  Service: Gastroenterology;  Laterality: N/A;   ESOPHAGOGASTRODUODENOSCOPY (EGD) WITH PROPOFOL N/A 10/29/2017   Procedure: ESOPHAGOGASTRODUODENOSCOPY (EGD) WITH PROPOFOL;  Surgeon: Beverley Fiedler, MD;  Location:  [facility];  Service: Gastroenterology;  Laterality: N/A;   ESOPHAGOGASTRODUODENOSCOPY (EGD) WITH PROPOFOL N/A 12/19/2018   Procedure: ESOPHAGOGASTRODUODENOSCOPY (EGD) WITH PROPOFOL;  Surgeon: Beverley Fiedler, MD;  Location: [facility];  Service: Gastroenterology;  Laterality: N/A;   EYE SURGERY     EYE SURGERY  09/2019   R eye procedure to relieve pressure   EYE SURGERY     FOOT NEUROMA  SURGERY Left    LEFT HEART CATH AND CORONARY ANGIOGRAPHY N/A 07/19/2023   Procedure: LEFT HEART CATH AND CORONARY ANGIOGRAPHY;  Surgeon: Corky Crafts, MD;  Location: [facility];  Service: Cardiovascular;  Laterality: N/A;   ORIF DISTAL RADIUS FRACTURE Right 07/04/2010   SAVORY DILATION N/A 06/23/2016   Procedure: SAVORY DILATION;  Surgeon: Beverley Fiedler, MD;  Location: [facility];  Service: Gastroenterology;  Laterality: N/A;   TENDON REPAIR Right 12/01/2016   Procedure: right long finger ulnar sagittal band reconstruction;  Surgeon: Betha Loa, MD;  Location: [facility];  Service: Orthopedics;  Laterality: Right;   TONSILLECTOMY      Outpatient Medications Prior to Visit  Medication Sig Dispense Refill   AMBULATORY NON FORMULARY MEDICATION Take 10 mg by mouth 3 (three) times daily. Medication Name: Domperidone 10 mg 270 capsule 3   aspirin EC 81 MG tablet Take 1 tablet (81 mg total) by mouth daily. Swallow whole.     buPROPion (WELLBUTRIN XL) 300 MG 24 hr tablet Take 300 mg by mouth daily.      busPIRone (BUSPAR) 10 MG tablet Take 10 mg by mouth 2 (two) times daily.      Cholecalciferol (VITAMIN D) 50 MCG (2000 UT) CAPS Take 2,000 Units by mouth daily.     cyanocobalamin (VITAMIN B12) 1000 MCG tablet Take 1,000 mcg by mouth daily.     esomeprazole (NEXIUM) 40 MG capsule TAKE 1 CAPSULE TWICE A DAY BEFORE MEALS 180 capsule 0   ibuprofen (ADVIL) 800 MG tablet TAKE 1 TABLET EVERY 8 HOURS AS NEEDED FOR PAIN 270 tablet 3   LamoTRIgine XR (LAMICTAL XR) 200 MG TB24 Take 1 tablet (200 mg total) by mouth daily. 90 tablet 1   losartan (COZAAR) 100 MG tablet Take 1 tablet (100 mg total) by mouth daily. 90 tablet 3   Magnesium 500 MG CAPS Take 400 mg by mouth at bedtime.     methocarbamol (ROBAXIN) 500 MG tablet Take 250-500 mg by mouth 2 (two) times daily as needed for muscle spasms.     methylPREDNISolone (MEDROL DOSEPAK) 4 MG TBPK tablet As directed 21 tablet 0    nystatin-triamcinolone ointment (MYCOLOG) Apply 1 Application topically daily at 6 (six) AM.     oxybutynin (DITROPAN-XL) 10 MG 24 hr tablet TAKE 1 TABLET AT BEDTIME 90 tablet 3   promethazine (PHENERGAN) 25 MG tablet TAKE 1 TABLET EVERY 4 HOURS AS NEEDED FOR NAUSEA 180 tablet 3   propranolol (INDERAL) 10 MG tablet Take 20 mg by mouth 2 (two) times daily.     senna (SENOKOT) 8.6 MG TABS tablet Take 1 tablet by mouth at bedtime.     silver sulfADIAZINE (SILVADENE) 1 % cream Apply 1 Application topically daily.     sucralfate (CARAFATE) 1 g tablet Take 1 tablet (1 g total) by mouth 2 (two) times daily. 60 tablet 3   Teriparatide, Recombinant, (FORTEO) 600 MCG/2.4ML SOPN Inject 2.4 mLs into the skin daily.     traMADol (ULTRAM) 50 MG tablet TAKE 2 TABLETS EVERY 6 HOURS AS NEEDED FOR  MODERATE PAIN 720 tablet 1   traZODone (DESYREL) 100 MG tablet Take 100 mg by mouth at bedtime.     albuterol (VENTOLIN HFA) 108 (90 Base) MCG/ACT inhaler Inhale 2 puffs into the lungs every 4 (four) hours as needed for wheezing or shortness of breath. (Patient not taking: Reported on 10/19/2023) 18 g 3   Melatonin 5 MG CAPS Take 5 mg by mouth at bedtime.     No facility-administered medications prior to visit.    Allergies  Allergen Reactions   Penicillins Hives, Nausea And Vomiting, Nausea Only, Rash and Other (See Comments)   Celebrex [Celecoxib] Hives   Morphine And Codeine Hives   Risperdal [Risperidone] Other (See Comments)    Tremors and seizures   Statins Other (See Comments)    MUSCLE ACHES   Lisinopril Cough   Hydrocodone Hives    Pt state that she takes it with Benadryl    Family History  Problem Relation Age of Onset   Emphysema Mother    Heart attack Father    Hypertension Other    Lung cancer Other    Colon cancer Neg Hx    Esophageal cancer Neg Hx    Rectal cancer Neg Hx    Stomach cancer Neg Hx     Social History   Tobacco Use   Smoking status: Never   Smokeless tobacco: Never   Vaping Use   Vaping status: Never Used  Substance Use Topics   Alcohol use: No    Alcohol/week: 0.0 standard drinks of alcohol   Drug use: No    ROS: As per history of present illness, otherwise negative  BP 118/70   Pulse 68   Ht 5\' 3"  (1.6 m)   BMI 30.11 kg/m  Gen: awake, alert, NAD HEENT: anicteric  Abd: soft, NT/ND, +BS throughout Ext: no c/c/e   RELEVANT LABS AND IMAGING: CBC    Component Value Date/Time   WBC 10.0 07/19/2023 0012   RBC 4.38 07/19/2023 0012   HGB 13.0 07/19/2023 0012   HCT 39.2 07/19/2023 0012   PLT 330 07/19/2023 0012   MCV 89.5 07/19/2023 0012   MCH 29.7 07/19/2023 0012   MCHC 33.2 07/19/2023 0012   RDW 12.7 07/19/2023 0012   LYMPHSABS 1.9 07/19/2023 0012   MONOABS 0.9 07/19/2023 0012   EOSABS 0.3 07/19/2023 0012   BASOSABS 0.1 07/19/2023 0012    CMP     Component Value Date/Time   NA 135 07/19/2023 0012   K 4.4 07/19/2023 0012   CL 102 07/19/2023 0012   CO2 22 07/19/2023 0012   GLUCOSE 110 (H) 07/19/2023 0012   BUN 20 07/19/2023 0012   CREATININE 1.08 (H) 07/19/2023 0012   CALCIUM 9.0 07/19/2023 0012   PROT 6.9 07/19/2023 0012   ALBUMIN 3.7 07/19/2023 0012   AST 21 07/19/2023 0012   ALT 15 07/19/2023 0012   ALKPHOS 62 07/19/2023 0012   BILITOT 0.5 07/19/2023 0012   GFRNONAA 54 (L) 07/19/2023 0012   GFRAA >60 04/20/2017 0518    Results   RADIOLOGY Barium swallow with tablet: Limited exam due to mobility from recent surgery. Normal appearing esophagus without mucosal lesions or strictures. Unable to fully assess motility. No evidence of hiatal hernia. Mild gastroesophageal reflux present. (08/10/2023)      ASSESSMENT/PLAN:  Esophageal Dysphagia Difficulty swallowing bread and pills, prefers soft foods and liquids. Previous dilations have provided temporary relief. Recent barium swallow showed no distinct narrowings, mild gastroesophageal reflux, and limited motility assessment due to  recent surgery. -Schedule upper  endoscopy with possible dilation using balloon versus Maloney dilator.  She has had improvement in symptoms after prior dilations  Gastroesophageal Reflux Disease (GERD) Mild reflux noted on recent barium swallow. Reports some heartburn despite Nexium twice daily. -Continue Nexium 40mg  twice daily. -Assess for uncontrolled reflux changes at EGD -Use Carafate 1g twice daily as needed.  Gastroparesis Reports early satiety and nausea, particularly in the morning. History of slow stomach emptying. -Small more frequent meals with gastroparesis type diet  Abdominal cramping --history of requesting refill of Levsin which she used some years ago with success -Add Levsin 0.125mg  sublingual every 6-8 hours for stomach cramps.  Constipation Managed with Senna. -Continue Senna 1 at bedtime.  Colon cancer screening Prior FIT testing or Cologuard per primary care. Decision made with the patient some years ago for noninvasive colorectal cancer screening as opposed to colonoscopy  40 minutes total spent today including patient facing time, coordination of care, reviewing medical history/procedures/pertinent radiology studies, and documentation of the encounter.        Cc:Fry, Tera Mater, Md [address] [place],  [place] [address]

## 2023-10-19 NOTE — Patient Instructions (Addendum)
You have been scheduled for an endoscopy. Please follow written instructions given to you at your visit today.  If you use inhalers (even only as needed), please bring them with you on the day of your procedure.  If you take any of the following medications, they will need to be adjusted prior to your procedure:   DO NOT TAKE 7 DAYS PRIOR TO TEST- Trulicity (dulaglutide) Ozempic, Wegovy (semaglutide) Mounjaro (tirzepatide) Bydureon Bcise (exanatide extended release)  DO NOT TAKE 1 DAY PRIOR TO YOUR TEST Rybelsus (semaglutide) Adlyxin (lixisenatide) Victoza (liraglutide) Byetta (exanatide) ___________________________________________________________________________    _______________________________________________________  If your blood pressure at your visit was 140/90 or greater, please contact your primary care physician to follow up on this.  _______________________________________________________  If you are age 66 or older, your body mass index should be between 23-30. Your Body mass index is 30.11 kg/m. If this is out of the aforementioned range listed, please consider follow up with your Primary Care Provider.  If you are age 91 or younger, your body mass index should be between 19-25. Your Body mass index is 30.11 kg/m. If this is out of the aformentioned range listed, please consider follow up with your Primary Care Provider.   ________________________________________________________  The [facility] providers would like to encourage you to use [facility] to communicate with providers for non-urgent requests or questions.  Due to long hold times on the telephone, sending your provider a message by [facility] may be a faster and more efficient way to get a response.  Please allow 48 business hours for a response.  Please remember that this is for non-urgent requests.  _______________________________________________________  Due to recent changes in healthcare laws, you may see  the results of your imaging and laboratory studies on MyChart before your provider has had a chance to review them.  We understand that in some cases there may be results that are confusing or concerning to you. Not all laboratory results come back in the same time frame and the provider may be waiting for multiple results in order to interpret others.  Please give [place] 48 hours in order for your provider to thoroughly review all the results before contacting the office for clarification of your results.   Thank you for choosing me and [facility].  Dr Erick Blinks

## 2023-11-15 ENCOUNTER — Telehealth: Payer: Self-pay | Admitting: Internal Medicine

## 2023-11-15 MED ORDER — HYOSCYAMINE SULFATE 0.125 MG SL SUBL: 0.1250 mg | SUBLINGUAL_TABLET | Freq: Four times a day (QID) | SUBLINGUAL | 1 refills | Status: DC | PRN

## 2023-11-15 NOTE — Telephone Encounter (Signed)
Prescription sent to [facility]. Patient notified and patient verbalized understanding.

## 2023-11-15 NOTE — Telephone Encounter (Signed)
 Inbound call from patient, requesting hyoscyamine medication to be [facility]. Patient states she was not able to pick up previous prescription due to it being sent to the incorrect pharmacy.

## 2023-11-16 NOTE — Addendum Note (Signed)
 Addended by: Jovita Kussmaul L on: 11/16/2023 11:07 AM   Modules accepted: Orders

## 2023-11-22 DIAGNOSIS — Z9689 Presence of other specified functional implants: Secondary | ICD-10-CM | POA: Diagnosis not present

## 2023-11-22 DIAGNOSIS — M7918 Myalgia, other site: Secondary | ICD-10-CM | POA: Diagnosis not present

## 2023-11-22 DIAGNOSIS — M47816 Spondylosis without myelopathy or radiculopathy, lumbar region: Secondary | ICD-10-CM | POA: Diagnosis not present

## 2023-11-22 NOTE — Telephone Encounter (Signed)
 Patient requesting medication refill for [facility] Please advise.

## 2023-11-23 MED ORDER — ESOMEPRAZOLE MAGNESIUM 40 MG PO CPDR: DELAYED_RELEASE_CAPSULE | ORAL | 0 refills | Status: DC

## 2023-11-23 NOTE — Addendum Note (Signed)
 Addended by: Illene Bolus on: 11/23/2023 09:41 AM   Modules accepted: Orders

## 2023-11-23 NOTE — Telephone Encounter (Addendum)
 Prescription sent [facility].

## 2023-12-08 DIAGNOSIS — M545 Low back pain, unspecified: Secondary | ICD-10-CM | POA: Diagnosis not present

## 2023-12-08 DIAGNOSIS — R531 Weakness: Secondary | ICD-10-CM | POA: Diagnosis not present

## 2023-12-15 ENCOUNTER — Other Ambulatory Visit: Payer: Self-pay | Admitting: Family Medicine

## 2023-12-15 DIAGNOSIS — Z1231 Encounter for screening mammogram for malignant neoplasm of breast: Secondary | ICD-10-CM

## 2023-12-21 DIAGNOSIS — R531 Weakness: Secondary | ICD-10-CM | POA: Diagnosis not present

## 2023-12-21 DIAGNOSIS — M545 Low back pain, unspecified: Secondary | ICD-10-CM | POA: Diagnosis not present

## 2023-12-28 DIAGNOSIS — M545 Low back pain, unspecified: Secondary | ICD-10-CM | POA: Diagnosis not present

## 2023-12-28 DIAGNOSIS — R531 Weakness: Secondary | ICD-10-CM | POA: Diagnosis not present

## 2023-12-29 ENCOUNTER — Encounter: Payer: Self-pay | Admitting: Internal Medicine

## 2023-12-29 ENCOUNTER — Ambulatory Visit (AMBULATORY_SURGERY_CENTER): Payer: Medicare Other | Admitting: Internal Medicine

## 2023-12-29 VITALS — BP 162/86 | HR 69 | Temp 97.5°F | Resp 16 | Ht 63.0 in | Wt 179.0 lb

## 2023-12-29 DIAGNOSIS — K219 Gastro-esophageal reflux disease without esophagitis: Secondary | ICD-10-CM

## 2023-12-29 DIAGNOSIS — K5710 Diverticulosis of small intestine without perforation or abscess without bleeding: Secondary | ICD-10-CM

## 2023-12-29 DIAGNOSIS — R1310 Dysphagia, unspecified: Secondary | ICD-10-CM | POA: Diagnosis not present

## 2023-12-29 DIAGNOSIS — K317 Polyp of stomach and duodenum: Secondary | ICD-10-CM

## 2023-12-29 DIAGNOSIS — R1319 Other dysphagia: Secondary | ICD-10-CM

## 2023-12-29 DIAGNOSIS — K449 Diaphragmatic hernia without obstruction or gangrene: Secondary | ICD-10-CM | POA: Diagnosis not present

## 2023-12-29 DIAGNOSIS — F319 Bipolar disorder, unspecified: Secondary | ICD-10-CM | POA: Diagnosis not present

## 2023-12-29 DIAGNOSIS — G20A1 Parkinson's disease without dyskinesia, without mention of fluctuations: Secondary | ICD-10-CM | POA: Diagnosis not present

## 2023-12-29 DIAGNOSIS — F32A Depression, unspecified: Secondary | ICD-10-CM | POA: Diagnosis not present

## 2023-12-29 MED ORDER — SODIUM CHLORIDE 0.9 % IV SOLN: 500.0000 mL | Freq: Once | INTRAVENOUS | Status: DC

## 2023-12-29 NOTE — Progress Notes (Signed)
GASTROENTEROLOGY PROCEDURE H&P NOTE   Primary Care Physician: [name], MD    Reason for Procedure:  Dysphagia and history of GERD  Plan:    EGD and dilation  Patient is appropriate for endoscopic procedure(s) in the ambulatory ([facility]) setting.  The nature of the procedure, as well as the risks, benefits, and alternatives were carefully and thoroughly reviewed with the patient. Ample time for discussion and questions allowed. The patient understood, was satisfied, and agreed to proceed.     HPI: Audrey Brown is a 78 y.o. female who presents for EGD with probable dilation.  Medical history as below.   No recent chest pain or shortness of breath.  No abdominal pain today.  Past Medical History:  Diagnosis Date   Arthritis    lower back   Bipolar disorder (HCC)    Depression    GERD (gastroesophageal reflux disease)    Glaucoma    bilateral   Hiatal hernia    Loss of vision    right eye   Parkinson disease (HCC)    Poor short-term memory    Sagittal band rupture at metacarpophalangeal joint 11/2016   right long finger    Past Surgical History:  Procedure Laterality Date   ANKLE SURGERY     BALLOON DILATION N/A 10/29/2017   Procedure: BALLOON DILATION;  Surgeon: Beverley Fiedler, MD;  Location: [facility];  Service: Gastroenterology;  Laterality: N/A;   CATARACT EXTRACTION Right 08/07/2020   CERVICAL DISC SURGERY  1994   CESAREAN SECTION     CHOLECYSTECTOMY     CYST REMOVAL NECK     ESOPHAGEAL DILATION  12/19/2018   Procedure: ESOPHAGEAL DILATION;  Surgeon: Beverley Fiedler, MD;  Location: [facility];  Service: Gastroenterology;;   ESOPHAGOGASTRODUODENOSCOPY (EGD) WITH ESOPHAGEAL DILATION  07/27/2012   with Propofol   ESOPHAGOGASTRODUODENOSCOPY (EGD) WITH PROPOFOL N/A 06/23/2016   Procedure: ESOPHAGOGASTRODUODENOSCOPY (EGD) WITH PROPOFOL;  Surgeon: Beverley Fiedler, MD;  Location: [facility];  Service: Gastroenterology;  Laterality: N/A;    ESOPHAGOGASTRODUODENOSCOPY (EGD) WITH PROPOFOL N/A 10/29/2017   Procedure: ESOPHAGOGASTRODUODENOSCOPY (EGD) WITH PROPOFOL;  Surgeon: Beverley Fiedler, MD;  Location: [facility];  Service: Gastroenterology;  Laterality: N/A;   ESOPHAGOGASTRODUODENOSCOPY (EGD) WITH PROPOFOL N/A 12/19/2018   Procedure: ESOPHAGOGASTRODUODENOSCOPY (EGD) WITH PROPOFOL;  Surgeon: Beverley Fiedler, MD;  Location: [facility];  Service: Gastroenterology;  Laterality: N/A;   EYE SURGERY     EYE SURGERY  09/2019   R eye procedure to relieve pressure   EYE SURGERY     FOOT NEUROMA SURGERY Left    LEFT HEART CATH AND CORONARY ANGIOGRAPHY N/A 07/19/2023   Procedure: LEFT HEART CATH AND CORONARY ANGIOGRAPHY;  Surgeon: Corky Crafts, MD;  Location: [facility];  Service: Cardiovascular;  Laterality: N/A;   ORIF DISTAL RADIUS FRACTURE Right 07/04/2010   SAVORY DILATION N/A 06/23/2016   Procedure: SAVORY DILATION;  Surgeon: Beverley Fiedler, MD;  Location: [facility];  Service: Gastroenterology;  Laterality: N/A;   TENDON REPAIR Right 12/01/2016   Procedure: right long finger ulnar sagittal band reconstruction;  Surgeon: Betha Loa, MD;  Location: [facility];  Service: Orthopedics;  Laterality: Right;   TONSILLECTOMY      Prior to Admission medications   Medication Sig Start Date End Date Taking? Authorizing Provider  AMBULATORY NON FORMULARY MEDICATION Take 10 mg by mouth 3 (three) times daily. Medication Name: Domperidone 10 mg 08/19/23  Yes Esterwood, Amy S, PA-C  aspirin EC 81  MG tablet Take 1 tablet (81 mg total) by mouth daily. Swallow whole. 07/20/23  Yes Jonita Albee, PA-C  buPROPion (WELLBUTRIN XL) 300 MG 24 hr tablet Take 300 mg by mouth daily.  03/27/16  Yes [provider]  busPIRone (BUSPAR) 10 MG tablet Take 10 mg by mouth 2 (two) times daily.    Yes [provider]  Calcium Carb-Cholecalciferol 500-2.5 MG-MCG CHEW    Yes [provider]  cyanocobalamin  (VITAMIN B12) 1000 MCG tablet Take 1,000 mcg by mouth daily.   Yes [provider]  esomeprazole (NEXIUM) 40 MG capsule TAKE 1 CAPSULE TWICE A DAY BEFORE MEALS 11/23/23  Yes Ludell Zacarias, Carie Caddy, MD  LamoTRIgine XR (LAMICTAL XR) 200 MG TB24 Take 1 tablet (200 mg total) by mouth daily. 12/15/13  Yes [name], MD  losartan (COZAAR) 100 MG tablet Take 1 tablet (100 mg total) by mouth daily. 07/07/23  Yes [name], MD  Magnesium 500 MG CAPS Take 400 mg by mouth at bedtime.   Yes [provider]  Melatonin 5 MG CAPS Take 5 mg by mouth at bedtime.   Yes [provider]  methocarbamol (ROBAXIN) 500 MG tablet Take by mouth. 11/22/23 02/20/24 Yes [provider]  OSCIMIN 0.125 MG SL tablet Place under the tongue 2 (two) times daily. 11/16/23  Yes [provider]  oxybutynin (DITROPAN-XL) 10 MG 24 hr tablet TAKE 1 TABLET AT BEDTIME 09/08/23  Yes [name], MD  promethazine (PHENERGAN) 25 MG tablet TAKE 1 TABLET EVERY 4 HOURS AS NEEDED FOR NAUSEA 04/15/23  Yes [name], MD  propranolol (INDERAL) 10 MG tablet Take 20 mg by mouth 2 (two) times daily. 03/19/22  Yes [provider]  senna (SENOKOT) 8.6 MG TABS tablet Take 1 tablet by mouth at bedtime.   Yes [provider]  Teriparatide, Recombinant, (FORTEO) 600 MCG/2.4ML SOPN Inject 2.4 mLs into the skin daily.   Yes [provider]  traMADol (ULTRAM) 50 MG tablet TAKE 2 TABLETS EVERY 6 HOURS AS NEEDED FOR MODERATE PAIN 05/14/23  Yes [name], MD  traZODone (DESYREL) 100 MG tablet Take 100 mg by mouth at bedtime. 06/16/23  Yes [provider]  albuterol (VENTOLIN HFA) 108 (90 Base) MCG/ACT inhaler Inhale 2 puffs into the lungs every 4 (four) hours as needed for wheezing or shortness of breath. Patient not taking: Reported on 10/19/2023 01/29/22   [name], MD  Cholecalciferol (VITAMIN D) 50 MCG (2000 UT) CAPS Take 2,000 Units by mouth daily. Patient not taking: Reported  on 12/29/2023    [provider]  ibuprofen (ADVIL) 800 MG tablet TAKE 1 TABLET EVERY 8 HOURS AS NEEDED FOR PAIN Patient not taking: Reported on 12/29/2023 05/17/23   [name], MD  oxyCODONE (OXY IR/ROXICODONE) 5 MG immediate release tablet Take 5 mg by mouth 4 (four) times daily as needed. 08/09/23   [provider]    Current Outpatient Medications  Medication Sig Dispense Refill   AMBULATORY NON FORMULARY MEDICATION Take 10 mg by mouth 3 (three) times daily. Medication Name: Domperidone 10 mg 270 capsule 3   aspirin EC 81 MG tablet Take 1 tablet (81 mg total) by mouth daily. Swallow whole.     buPROPion (WELLBUTRIN XL) 300 MG 24 hr tablet Take 300 mg by mouth daily.      busPIRone (BUSPAR) 10 MG tablet Take 10 mg by mouth 2 (two) times daily.      Calcium  Carb-Cholecalciferol 500-2.5 MG-MCG CHEW      cyanocobalamin (VITAMIN B12) 1000 MCG tablet Take 1,000 mcg by mouth daily.     esomeprazole (NEXIUM) 40 MG capsule TAKE 1 CAPSULE TWICE A DAY BEFORE MEALS 180 capsule 0   LamoTRIgine XR (LAMICTAL XR) 200 MG TB24 Take 1 tablet (200 mg total) by mouth daily. 90 tablet 1   losartan (COZAAR) 100 MG tablet Take 1 tablet (100 mg total) by mouth daily. 90 tablet 3   Magnesium 500 MG CAPS Take 400 mg by mouth at bedtime.     Melatonin 5 MG CAPS Take 5 mg by mouth at bedtime.     methocarbamol (ROBAXIN) 500 MG tablet Take by mouth.     OSCIMIN 0.125 MG SL tablet Place under the tongue 2 (two) times daily.     oxybutynin (DITROPAN-XL) 10 MG 24 hr tablet TAKE 1 TABLET AT BEDTIME 90 tablet 3   promethazine (PHENERGAN) 25 MG tablet TAKE 1 TABLET EVERY 4 HOURS AS NEEDED FOR NAUSEA 180 tablet 3   propranolol (INDERAL) 10 MG tablet Take 20 mg by mouth 2 (two) times daily.     senna (SENOKOT) 8.6 MG TABS tablet Take 1 tablet by mouth at bedtime.     Teriparatide, Recombinant, (FORTEO) 600 MCG/2.4ML SOPN Inject 2.4 mLs into the skin daily.     traMADol (ULTRAM) 50 MG tablet TAKE 2 TABLETS  EVERY 6 HOURS AS NEEDED FOR MODERATE PAIN 720 tablet 1   traZODone (DESYREL) 100 MG tablet Take 100 mg by mouth at bedtime.     albuterol (VENTOLIN HFA) 108 (90 Base) MCG/ACT inhaler Inhale 2 puffs into the lungs every 4 (four) hours as needed for wheezing or shortness of breath. (Patient not taking: Reported on 10/19/2023) 18 g 3   Cholecalciferol (VITAMIN D) 50 MCG (2000 UT) CAPS Take 2,000 Units by mouth daily. (Patient not taking: Reported on 12/29/2023)     ibuprofen (ADVIL) 800 MG tablet TAKE 1 TABLET EVERY 8 HOURS AS NEEDED FOR PAIN (Patient not taking: Reported on 12/29/2023) 270 tablet 3   oxyCODONE (OXY IR/ROXICODONE) 5 MG immediate release tablet Take 5 mg by mouth 4 (four) times daily as needed.     Current Facility-Administered Medications  Medication Dose Route Frequency Provider Last Rate Last Admin   0.9 %  sodium chloride infusion  500 mL Intravenous Once Dakoda Laventure, Carie Caddy, MD        Allergies as of 12/29/2023 - Review Complete 12/29/2023  Allergen Reaction Noted   Penicillins Hives, Nausea And Vomiting, Nausea Only, Rash, and Other (See Comments) 03/03/2005   Celebrex [celecoxib] Hives 07/31/2010   Lisinopril Cough 06/16/2021   Morphine and codeine Hives 06/16/2011   Risperdal [risperidone] Other (See Comments) 04/10/2013   Statins Other (See Comments) 09/17/2011   Hydrocodone Hives 10/16/2021    Family History  Problem Relation Age of Onset   Emphysema Mother    Heart attack Father    Hypertension Other    Lung cancer Other    Colon cancer Neg Hx    Esophageal cancer Neg Hx    Rectal cancer Neg Hx    Stomach cancer Neg Hx    Colon polyps Neg Hx     Social History   Socioeconomic History   Marital status: Widowed    Spouse name: Not on file   Number of children: 4   Years of education: 6 years college   Highest education level: Master's degree (e.g., MA, MS, MEng, MEd, MSW, MBA)  Occupational History   Occupation: Retired    Associate Professor: RETIRED    Comment:  [employer]  Tobacco Use   Smoking status: Never   Smokeless tobacco: Never  Vaping Use   Vaping status: Never Used  Substance and Sexual Activity   Alcohol use: No    Alcohol/week: 0.0 standard drinks of alcohol   Drug use: No   Sexual activity: Not Currently  Other Topics Concern   Not on file  Social History Narrative   Daughter is her POA and must come to all visits in GI and [facility]       Patient is right-handed. She lives with her daughter and granddaughter in a split level home with a chair lift. Uses a power chair. She drinks 3-4 cups of coffee a day.      Has 4 daughters, 3 grand children, and 5 great-grand children.   Social Drivers of Corporate investment banker Strain: Low Risk  (03/19/2023)   Overall Financial Resource Strain (CARDIA)    Difficulty of Paying Living Expenses: Not hard at all  Food Insecurity: No Food Insecurity (03/19/2023)   Hunger Vital Sign    Worried About Running Out of Food in the Last Year: Never true    Ran Out of Food in the Last Year: Never true  Transportation Needs: No Transportation Needs (03/19/2023)   PRAPARE - Administrator, Civil Service (Medical): No    Lack of Transportation (Non-Medical): No  Physical Activity: Inactive (03/19/2023)   Exercise Vital Sign    Days of Exercise per Week: 0 days    Minutes of Exercise per Session: 0 min  Stress: No Stress Concern Present (03/19/2023)   [employer] of Occupational Health - Occupational Stress Questionnaire    Feeling of Stress : Not at all  Social Connections: Moderately Integrated (03/19/2023)   Social Connection and Isolation Panel [NHANES]    Frequency of Communication with Friends and Family: More than three times a week    Frequency of Social Gatherings with Friends and Family: More than three times a week    Attends Religious Services: More than 4 times per year    Active Member of [employer] or Organizations: Yes    Attends Banker Meetings: More than 4 times per  year    Marital Status: Widowed  Intimate Partner Violence: Not At Risk (03/19/2023)   Humiliation, Afraid, Rape, and Kick questionnaire    Fear of Current or Ex-Partner: No    Emotionally Abused: No    Physically Abused: No    Sexually Abused: No    Physical Exam: Vital signs in last 24 hours: @BP  (!) 144/66   Pulse 65   Temp (!) 97.5 F (36.4 C) (Temporal)   Ht 5\' 3"  (1.6 m)   Wt 179 lb (81.2 kg)   SpO2 98%   BMI 31.71 kg/m  GEN: NAD EYE: Sclerae anicteric ENT: MMM CV: Non-tachycardic Pulm: CTA b/l GI: Soft, NT/ND NEURO:  Alert & Oriented x 3   Erick Blinks, MD [facility]  12/29/2023 10:05 AM

## 2023-12-29 NOTE — Patient Instructions (Signed)
Educational handout given on Hiatal hernia  Resume previous diet  Continue present medications   YOU HAD AN ENDOSCOPIC PROCEDURE TODAY AT THE [facility]:   Refer to the procedure report that was given to you for any specific questions about what was found during the examination.  If the procedure report does not answer your questions, please call your gastroenterologist to clarify.  If you requested that your care partner not be given the details of your procedure findings, then the procedure report has been included in a sealed envelope for you to review at your convenience later.  YOU SHOULD EXPECT: Some feelings of bloating in the abdomen. Passage of more gas than usual.  Walking can help get rid of the air that was put into your GI tract during the procedure and reduce the bloating. If you had a lower endoscopy (such as a colonoscopy or flexible sigmoidoscopy) you may notice spotting of blood in your stool or on the toilet paper. If you underwent a bowel prep for your procedure, you may not have a normal bowel movement for a few days.  Please Note:  You might notice some irritation and congestion in your nose or some drainage.  This is from the oxygen used during your procedure.  There is no need for concern and it should clear up in a day or so.  SYMPTOMS TO REPORT IMMEDIATELY:  Following upper endoscopy (EGD)  Vomiting of blood or coffee ground material  New chest pain or pain under the shoulder blades  Painful or persistently difficult swallowing  New shortness of breath  Fever of 100F or higher  Black, tarry-looking stools  For urgent or emergent issues, a gastroenterologist can be reached at any hour by calling [phone]. Do not use MyChart messaging for urgent concerns.    DIET:  We do recommend a small meal at first, but then you may proceed to your regular diet.  Drink plenty of fluids but you should avoid alcoholic beverages for 24 hours.  ACTIVITY:  You  should plan to take it easy for the rest of today and you should NOT DRIVE or use heavy machinery until tomorrow (because of the sedation medicines used during the test).    FOLLOW UP: Our staff will call the number listed on your records the next business day following your procedure.  We will call around 7:15- 8:00 am to check on you and address any questions or concerns that you may have regarding the information given to you following your procedure. If we do not reach you, we will leave a message.     If any biopsies were taken you will be contacted by phone or by letter within the next 1-3 weeks.  Please call us at [phone] if you have not heard about the biopsies in 3 weeks.    SIGNATURES/CONFIDENTIALITY: You and/or your care partner have signed paperwork which will be entered into your electronic medical record.  These signatures attest to the fact that that the information above on your After Visit Summary has been reviewed and is understood.  Full responsibility of the confidentiality of this discharge information lies with you and/or your care-partner.

## 2023-12-29 NOTE — Progress Notes (Signed)
 Sedate, gd SR, tolerated procedure well, VSS, report to RN

## 2023-12-29 NOTE — Op Note (Signed)
[facility] Patient Name: Audrey Brown Procedure Date: 12/29/2023 10:08 AM MRN: [id number] Endoscopist: Beverley Fiedler , MD, [id number] Age: 78 Referring MD:  Date of Birth: [birth year] Gender: Female Account #: [id number] Procedure:                Upper GI endoscopy Indications:              Dysphagia (responsive to dilation over multiple                            prior EGDs, last June 2021), Gastro-esophageal                            reflux disease Medicines:                Monitored Anesthesia Care Procedure:                Pre-Anesthesia Assessment:                           - Prior to the procedure, a History and Physical                            was performed, and patient medications and                            allergies were reviewed. The patient's tolerance of                            previous anesthesia was also reviewed. The risks                            and benefits of the procedure and the sedation                            options and risks were discussed with the patient.                            All questions were answered, and informed consent                            was obtained. Prior Anticoagulants: The patient has                            taken no anticoagulant or antiplatelet agents. ASA                            Grade Assessment: III - A patient with severe                            systemic disease. After reviewing the risks and                            benefits, the patient was deemed in satisfactory  condition to undergo the procedure.                           After obtaining informed consent, the endoscope was                            passed under direct vision. Throughout the                            procedure, the patient's blood pressure, pulse, and                            oxygen saturations were monitored continuously. The                            Olympus Scope SN [id number] was introduced  through the                            mouth, and advanced to the second part of duodenum.                            The upper GI endoscopy was accomplished without                            difficulty. The patient tolerated the procedure                            well. Scope In: Scope Out: Findings:                 No endoscopic abnormality was evident in the                            esophagus to explain the patient's complaint of                            dysphagia. It was decided, however, to proceed with                            dilation of the entire esophagus. A TTS dilator was                            passed through the scope. Dilation with an 18-19-20                            mm balloon dilator was performed to 19 mm                            (initially in distal esophagus across the GE                            junction and then the balloon was pulled through to  the upper esophagus for dilation effect).                           A 1 cm hiatal hernia was present.                           A few diminutive sessile polyps with no stigmata of                            recent bleeding were found in the cardia, gastric                            fundus and body.                           A small diverticulum was found at the major papilla.                           The exam of the duodenum was otherwise normal. Complications:            No immediate complications. Estimated Blood Loss:     Estimated blood loss: none. Impression:               - No endoscopic esophageal abnormality to explain                            patient's dysphagia. Esophagus dilated with 19 mm                            balloon.                           - 1 cm hiatal hernia.                           - A few gastric polyps. Small and benign appearing.                           - Duodenal diverticulum.                           - No specimens  collected. Recommendation:           - Patient has a contact number available for                            emergencies. The signs and symptoms of potential                            delayed complications were discussed with the                            patient. Return to normal activities tomorrow.                            Written discharge instructions were provided to the  patient.                           - Resume previous diet.                           - Continue present medications. Beverley Fiedler, MD 12/29/2023 10:32:04 AM This report has been signed electronically.

## 2023-12-29 NOTE — Progress Notes (Signed)
 Called to room to assist during endoscopic procedure.  Patient ID and intended procedure confirmed with present staff. Received instructions for my participation in the procedure from the performing physician.

## 2023-12-30 ENCOUNTER — Telehealth: Payer: Self-pay

## 2023-12-30 NOTE — Telephone Encounter (Signed)
 LMOM

## 2024-01-04 DIAGNOSIS — R531 Weakness: Secondary | ICD-10-CM | POA: Diagnosis not present

## 2024-01-04 DIAGNOSIS — M545 Low back pain, unspecified: Secondary | ICD-10-CM | POA: Diagnosis not present

## 2024-01-16 NOTE — Progress Notes (Unsigned)
 Cardiology Office Note:    Date:  01/16/2024   ID:  Audrey Brown, DOB [birth year], MRN [id number]  PCP:  [name], MD   [facility] Providers Cardiologist:  Tonny Bollman, MD { Click to update primary MD,subspecialty MD or APP then REFRESH:1}    Referring MD: [name], MD   No chief complaint on file. ***  History of Present Illness:    Audrey Brown is a 78 y.o. female presenting for follow-up evaluation.  The patient has a history of mild nonobstructive CAD, hypertension, and chest pain.  She underwent cardiac catheterization in September 2024 when she presented with a chest pain syndrome and had low-level elevation of high-sensitivity troponin.  She was felt to have suffered a non-STEMI, but was found to have very mild coronary artery disease with less than 25% coronary stenoses.  LVEF was normal at 55 to 65%.   Current Medications: No outpatient medications have been marked as taking for the 01/17/24 encounter (Appointment) with Tonny Bollman, MD.     Allergies:   Penicillins, Celebrex [celecoxib], Lisinopril, Morphine and codeine, Risperdal [risperidone], Statins, and Hydrocodone   ROS:   Please see the history of present illness.    *** All other systems reviewed and are negative.  EKGs/Labs/Other Studies Reviewed:    The following studies were reviewed today: Cardiac Studies & Procedures   ______________________________________________________________________________________________ CARDIAC CATHETERIZATION  CARDIAC CATHETERIZATION 07/19/2023  Narrative   Mid LAD lesion is 25% stenosed.   Prox RCA lesion is 10% stenosed.   The left ventricular systolic function is normal.   LV end diastolic pressure is mildly elevated.  LVEDP 19 mm Hg.   The left ventricular ejection fraction is 55-65% by visual estimate.   There is no aortic valve stenosis.   In the absence of any other complications or medical issues, we expect the patient to be ready  for discharge from a cath perspective on 07/19/2023.   Recommend Aspirin 81mg  daily for moderate CAD.  Mild, nonobstructive CAD.  Continue medical therapy.  Findings Coronary Findings Diagnostic  Dominance: Right  Left Anterior Descending Mid LAD lesion is 25% stenosed.  Right Coronary Artery Prox RCA lesion is 10% stenosed.  Intervention  No interventions have been documented.              ______________________________________________________________________________________________      EKG:        Recent Labs: 07/19/2023: ALT 15; BUN 20; Creatinine, Ser 1.08; Hemoglobin 13.0; Platelets 330; Potassium 4.4; Sodium 135  Recent Lipid Panel    Component Value Date/Time   CHOL 225 (H) 12/25/2015 0936   TRIG 91.0 12/25/2015 0936   HDL 59.50 12/25/2015 0936   CHOLHDL 4 12/25/2015 0936   VLDL 18.2 12/25/2015 0936   LDLCALC 147 (H) 12/25/2015 0936     Risk Assessment/Calculations:   {Does this patient have ATRIAL FIBRILLATION?:[phone]}  No BP recorded.  {Refresh Note OR Click here to enter BP  :1}***         Physical Exam:    VS:  There were no vitals taken for this visit.    Wt Readings from Last 3 Encounters:  12/29/23 179 lb (81.2 kg)  08/20/23 170 lb (77.1 kg)  08/06/23 165 lb (74.8 kg)     GEN: *** Well nourished, well developed in no acute distress HEENT: Normal NECK: No JVD; No carotid bruits LYMPHATICS: No lymphadenopathy CARDIAC: ***RRR, no murmurs, rubs, gallops RESPIRATORY:  Clear to auscultation without rales, wheezing or rhonchi  ABDOMEN: Soft, non-tender, non-distended MUSCULOSKELETAL:  No edema; No deformity  SKIN: Warm and dry NEUROLOGIC:  Alert and oriented x 3 PSYCHIATRIC:  Normal affect   Assessment & Plan Essential hypertension  Coronary artery disease involving native coronary artery of native heart without angina pectoris        {Are you ordering a CV Procedure (e.g. stress test, cath, DCCV, TEE, etc)?   Press F2         :[id number]}    Medication Adjustments/Labs and Tests Ordered: Current medicines are reviewed at length with the patient today.  Concerns regarding medicines are outlined above.  No orders of the defined types were placed in this encounter.  No orders of the defined types were placed in this encounter.   There are no Patient Instructions on file for this visit.   Signed, Tonny Bollman, MD  01/16/2024 6:13 PM    [facility]

## 2024-01-17 ENCOUNTER — Ambulatory Visit: Payer: Medicare Other | Attending: Cardiovascular Disease | Admitting: Cardiovascular Disease

## 2024-01-17 ENCOUNTER — Encounter: Payer: Self-pay | Admitting: Cardiovascular Disease

## 2024-01-17 VITALS — BP 130/80 | HR 66 | Ht 63.0 in | Wt 170.0 lb

## 2024-01-17 DIAGNOSIS — I10 Essential (primary) hypertension: Secondary | ICD-10-CM | POA: Diagnosis present

## 2024-01-17 DIAGNOSIS — I2510 Atherosclerotic heart disease of native coronary artery without angina pectoris: Secondary | ICD-10-CM | POA: Diagnosis present

## 2024-01-17 DIAGNOSIS — E782 Mixed hyperlipidemia: Secondary | ICD-10-CM | POA: Diagnosis not present

## 2024-01-17 NOTE — Patient Instructions (Signed)
 Testing/Procedures: Ambulatory referral to lipid clinic (PCSK9i)   Follow-Up: At [facility], you and your health needs are our priority.  As part of our continuing mission to provide you with exceptional heart care, we have created designated Provider Care Teams.  These Care Teams include your primary Cardiologist (physician) and Advanced Practice Providers (APPs -  Physician Assistants and Nurse Practitioners) who all work together to provide you with the care you need, when you need it.   Your next appointment:   As Needed  Provider:   Tonny Bollman, MD      1st Floor: - Lobby - Registration  - Pharmacy  - Lab - Cafe  2nd Floor: - PV Lab - Diagnostic Testing (echo, CT, nuclear med)  3rd Floor: - Vacant  4th Floor: - TCTS (cardiothoracic surgery) - AFib Clinic - Structural Heart Clinic - Vascular Surgery  - Vascular Ultrasound  5th Floor: - HeartCare Cardiology (general and EP) - Clinical Pharmacy for coumadin, hypertension, lipid, weight-loss medications, and med management appointments    Valet parking services will be available as well.

## 2024-01-20 DIAGNOSIS — M545 Low back pain, unspecified: Secondary | ICD-10-CM | POA: Diagnosis not present

## 2024-01-20 DIAGNOSIS — R531 Weakness: Secondary | ICD-10-CM | POA: Diagnosis not present

## 2024-01-25 ENCOUNTER — Ambulatory Visit: Payer: Self-pay | Admitting: Family Medicine

## 2024-01-25 DIAGNOSIS — H4020X4 Unspecified primary angle-closure glaucoma, indeterminate stage: Secondary | ICD-10-CM | POA: Diagnosis not present

## 2024-01-25 DIAGNOSIS — H53453 Other localized visual field defect, bilateral: Secondary | ICD-10-CM | POA: Diagnosis not present

## 2024-01-25 DIAGNOSIS — H04123 Dry eye syndrome of bilateral lacrimal glands: Secondary | ICD-10-CM | POA: Diagnosis not present

## 2024-01-25 DIAGNOSIS — H35371 Puckering of macula, right eye: Secondary | ICD-10-CM | POA: Diagnosis not present

## 2024-01-25 DIAGNOSIS — Z961 Presence of intraocular lens: Secondary | ICD-10-CM | POA: Diagnosis not present

## 2024-01-25 NOTE — Telephone Encounter (Signed)
  Chief Complaint: elevated BP this am 185/118 per patient and caregiver, daughter Symptoms: denies sx . BP this am 185/118 at approx 0900. Taking medications for HTN every day . No missed doses. Patient in car with daughter and unable to recheck BP .  Frequency: today and noted last few weeks Pertinent Negatives: Patient denies chest pain no difficulty breathing no headaches no blurred vision no dizziness no weakness on either side reported.  Disposition: [] ED /[] Urgent Care (no appt availability in office) / [x] Appointment(In office/virtual)/ []  [facility]/ [] Home Care/ [] Refused Recommended Disposition /[]  Mobile Bus/ []  Follow-up with PCP Additional Notes:   No appt that patient could make today with PCP. Scheduled appt for 01/26/24 with other provider in practice. Recommended to recheck BP and call back if continues to be elevated diastolic greater than 100 and sx noted. Pt daughter reports she keep log of all BP .         Copied from CRM [id number]. Topic: Clinical - Red Word Triage >> Jan 25, 2024  1:46 PM Kathryne Eriksson wrote: Red Word that prompted transfer to Nurse Triage: Elevated Blood Pressure 185/118 Reason for Disposition  Systolic BP  >= 180 OR Diastolic >= 110  Answer Assessment - Initial Assessment Questions 1. BLOOD PRESSURE: "What is the blood pressure?" "Did you take at least two measurements 5 minutes apart?"     *No Answer* 2. ONSET: "When did you take your blood pressure?"     *No Answer* 3. HOW: "How did you take your blood pressure?" (e.g., automatic home BP monitor, visiting nurse)     *No Answer* 4. HISTORY: "Do you have a history of high blood pressure?"     *No Answer* 5. MEDICINES: "Are you taking any medicines for blood pressure?" "Have you missed any doses recently?"     *No Answer* 6. OTHER SYMPTOMS: "Do you have any symptoms?" (e.g., blurred vision, chest pain, difficulty breathing, headache, weakness)     No sx  7. PREGNANCY: "Is  there any chance you are pregnant?" "When was your last menstrual period?"     na  Protocols used: Blood Pressure - High-A-AH

## 2024-01-26 ENCOUNTER — Ambulatory Visit (INDEPENDENT_AMBULATORY_CARE_PROVIDER_SITE_OTHER): Admitting: Internal Medicine

## 2024-01-26 ENCOUNTER — Encounter: Payer: Self-pay | Admitting: Internal Medicine

## 2024-01-26 VITALS — BP 136/73 | HR 70 | Temp 97.4°F

## 2024-01-26 DIAGNOSIS — I10 Essential (primary) hypertension: Secondary | ICD-10-CM

## 2024-01-26 DIAGNOSIS — M545 Low back pain, unspecified: Secondary | ICD-10-CM | POA: Diagnosis not present

## 2024-01-26 DIAGNOSIS — R531 Weakness: Secondary | ICD-10-CM | POA: Diagnosis not present

## 2024-01-26 NOTE — Progress Notes (Signed)
 Established Patient Office Visit     CC/Reason for Visit: High blood pressure  HPI: Audrey Brown is a 78 y.o. female who is coming in today for the above mentioned reasons.  She was sent here by nurse triage due to elevated blood pressure.  She is known to have hypertension.  She has not had chest pain, shortness of breath, focal neurologic deficits or headache.  Home ambulatory measurements are as follows:  132/71 131/89 122/71 186/92 132/70 167/118.  She does states she has been dealing lately with an issue with her eye that she believes may be causing some anxiety and may be the reason for the elevated blood pressure.  She was told by her ophthalmologist that she may lose vision.   Past Medical/Surgical History: Past Medical History:  Diagnosis Date   Arthritis    lower back   Bipolar disorder (HCC)    Depression    GERD (gastroesophageal reflux disease)    Glaucoma    bilateral   Hiatal hernia    Loss of vision    right eye   Parkinson disease (HCC)    Poor short-term memory    Sagittal band rupture at metacarpophalangeal joint 11/2016   right long finger    Past Surgical History:  Procedure Laterality Date   ANKLE SURGERY     BALLOON DILATION N/A 10/29/2017   Procedure: BALLOON DILATION;  Surgeon: Beverley Fiedler, MD;  Location: [facility];  Service: Gastroenterology;  Laterality: N/A;   CATARACT EXTRACTION Right 08/07/2020   CERVICAL DISC SURGERY  1994   CESAREAN SECTION     CHOLECYSTECTOMY     CYST REMOVAL NECK     ESOPHAGEAL DILATION  12/19/2018   Procedure: ESOPHAGEAL DILATION;  Surgeon: Beverley Fiedler, MD;  Location: [facility];  Service: Gastroenterology;;   ESOPHAGOGASTRODUODENOSCOPY (EGD) WITH ESOPHAGEAL DILATION  07/27/2012   with Propofol   ESOPHAGOGASTRODUODENOSCOPY (EGD) WITH PROPOFOL N/A 06/23/2016   Procedure: ESOPHAGOGASTRODUODENOSCOPY (EGD) WITH PROPOFOL;  Surgeon: Beverley Fiedler, MD;  Location: [facility];  Service:  Gastroenterology;  Laterality: N/A;   ESOPHAGOGASTRODUODENOSCOPY (EGD) WITH PROPOFOL N/A 10/29/2017   Procedure: ESOPHAGOGASTRODUODENOSCOPY (EGD) WITH PROPOFOL;  Surgeon: Beverley Fiedler, MD;  Location: [facility];  Service: Gastroenterology;  Laterality: N/A;   ESOPHAGOGASTRODUODENOSCOPY (EGD) WITH PROPOFOL N/A 12/19/2018   Procedure: ESOPHAGOGASTRODUODENOSCOPY (EGD) WITH PROPOFOL;  Surgeon: Beverley Fiedler, MD;  Location: [facility];  Service: Gastroenterology;  Laterality: N/A;   EYE SURGERY     EYE SURGERY  09/2019   R eye procedure to relieve pressure   EYE SURGERY     FOOT NEUROMA SURGERY Left    LEFT HEART CATH AND CORONARY ANGIOGRAPHY N/A 07/19/2023   Procedure: LEFT HEART CATH AND CORONARY ANGIOGRAPHY;  Surgeon: Corky Crafts, MD;  Location: [facility];  Service: Cardiovascular;  Laterality: N/A;   ORIF DISTAL RADIUS FRACTURE Right 07/04/2010   SAVORY DILATION N/A 06/23/2016   Procedure: SAVORY DILATION;  Surgeon: Beverley Fiedler, MD;  Location: [facility];  Service: Gastroenterology;  Laterality: N/A;   TENDON REPAIR Right 12/01/2016   Procedure: right long finger ulnar sagittal band reconstruction;  Surgeon: Betha Loa, MD;  Location: [facility];  Service: Orthopedics;  Laterality: Right;   TONSILLECTOMY      Social History:  reports that she has never smoked. She has never used smokeless tobacco. She reports that she does not drink alcohol and does not use drugs.  Allergies: Allergies  Allergen Reactions  Penicillins Hives, Nausea And Vomiting, Nausea Only, Rash and Other (See Comments)   Celebrex [Celecoxib] Hives   Lisinopril Cough   Morphine And Codeine Hives   Risperdal [Risperidone] Other (See Comments)    Tremors and seizures   Statins Other (See Comments)    MUSCLE ACHES   Hydrocodone Hives    Pt state that she takes it with Benadryl    Family History:  Family History  Problem Relation Age of Onset   Emphysema Mother    Heart  attack Father    Hypertension Other    Lung cancer Other    Colon cancer Neg Hx    Esophageal cancer Neg Hx    Rectal cancer Neg Hx    Stomach cancer Neg Hx    Colon polyps Neg Hx      Current Outpatient Medications:    albuterol (VENTOLIN HFA) 108 (90 Base) MCG/ACT inhaler, Inhale 2 puffs into the lungs every 4 (four) hours as needed for wheezing or shortness of breath., Disp: 18 g, Rfl: 3   AMBULATORY NON FORMULARY MEDICATION, Take 10 mg by mouth 3 (three) times daily. Medication Name: Domperidone 10 mg, Disp: 270 capsule, Rfl: 3   aspirin EC 81 MG tablet, Take 1 tablet (81 mg total) by mouth daily. Swallow whole., Disp: , Rfl:    buPROPion (WELLBUTRIN XL) 300 MG 24 hr tablet, Take 300 mg by mouth daily. , Disp: , Rfl:    busPIRone (BUSPAR) 10 MG tablet, Take 10 mg by mouth 2 (two) times daily. , Disp: , Rfl:    Calcium Carb-Cholecalciferol 500-2.5 MG-MCG CHEW, , Disp: , Rfl:    Cholecalciferol (VITAMIN D) 50 MCG (2000 UT) CAPS, Take 2,000 Units by mouth daily., Disp: , Rfl:    cyanocobalamin (VITAMIN B12) 1000 MCG tablet, Take 1,000 mcg by mouth daily., Disp: , Rfl:    esomeprazole (NEXIUM) 40 MG capsule, TAKE 1 CAPSULE TWICE A DAY BEFORE MEALS, Disp: 180 capsule, Rfl: 0   ibuprofen (ADVIL) 800 MG tablet, TAKE 1 TABLET EVERY 8 HOURS AS NEEDED FOR PAIN, Disp: 270 tablet, Rfl: 3   LamoTRIgine XR (LAMICTAL XR) 200 MG TB24, Take 1 tablet (200 mg total) by mouth daily., Disp: 90 tablet, Rfl: 1   losartan (COZAAR) 100 MG tablet, Take 1 tablet (100 mg total) by mouth daily., Disp: 90 tablet, Rfl: 3   Magnesium 500 MG CAPS, Take 400 mg by mouth at bedtime., Disp: , Rfl:    Melatonin 5 MG CAPS, Take 5 mg by mouth at bedtime., Disp: , Rfl:    methocarbamol (ROBAXIN) 500 MG tablet, Take by mouth., Disp: , Rfl:    OSCIMIN 0.125 MG SL tablet, Place under the tongue 2 (two) times daily., Disp: , Rfl:    oxybutynin (DITROPAN-XL) 10 MG 24 hr tablet, TAKE 1 TABLET AT BEDTIME, Disp: 90 tablet, Rfl: 3    oxyCODONE (OXY IR/ROXICODONE) 5 MG immediate release tablet, Take 5 mg by mouth 4 (four) times daily as needed., Disp: , Rfl:    promethazine (PHENERGAN) 25 MG tablet, TAKE 1 TABLET EVERY 4 HOURS AS NEEDED FOR NAUSEA, Disp: 180 tablet, Rfl: 3   propranolol (INDERAL) 10 MG tablet, Take 20 mg by mouth 2 (two) times daily., Disp: , Rfl:    senna (SENOKOT) 8.6 MG TABS tablet, Take 1 tablet by mouth at bedtime., Disp: , Rfl:    Teriparatide, Recombinant, (FORTEO) 600 MCG/2.4ML SOPN, Inject 2.4 mLs into the skin daily., Disp: , Rfl:    traMADol (ULTRAM)  50 MG tablet, TAKE 2 TABLETS EVERY 6 HOURS AS NEEDED FOR MODERATE PAIN, Disp: 720 tablet, Rfl: 1   traZODone (DESYREL) 100 MG tablet, Take 100 mg by mouth at bedtime., Disp: , Rfl:   Review of Systems:  Negative unless indicated in HPI.   Physical Exam: Vitals:   01/26/24 1034 01/26/24 1039 01/26/24 1105 01/26/24 1106  BP: (!) 140/80 (!) 106/57 130/65 136/73  Pulse: 70     Temp: (!) 97.4 F (36.3 C)     TempSrc: Oral     SpO2: 95%       There is no height or weight on file to calculate BMI.   Physical Exam Vitals reviewed.  Constitutional:      Appearance: Normal appearance.  Cardiovascular:     Rate and Rhythm: Normal rate and regular rhythm.  Pulmonary:     Effort: Pulmonary effort is normal.     Breath sounds: Normal breath sounds.  Skin:    General: Skin is warm and dry.  Neurological:     Mental Status: She is alert.  Psychiatric:        Mood and Affect: Mood normal.        Behavior: Behavior normal.        Thought Content: Thought content normal.        Judgment: Judgment normal.      Impression and Plan:  Essential hypertension  -With current four in office measurements coupled by home measurements do not feel further escalation of antihypertensive agents is necessary.  Will follow-up with PCP as scheduled.   Time spent:22 minutes reviewing chart, interviewing and examining patient and formulating plan of  care.     Chaya Jan, MD [facility]

## 2024-01-31 ENCOUNTER — Ambulatory Visit
Admission: RE | Admit: 2024-01-31 | Discharge: 2024-01-31 | Disposition: A | Discharge status: Home / Self Care | Payer: Medicare Other | Source: Ambulatory Visit | Attending: Family Medicine | Admitting: Family Medicine

## 2024-01-31 DIAGNOSIS — Z1231 Encounter for screening mammogram for malignant neoplasm of breast: Secondary | ICD-10-CM | POA: Diagnosis not present

## 2024-02-11 DIAGNOSIS — H40032 Anatomical narrow angle, left eye: Secondary | ICD-10-CM | POA: Diagnosis not present

## 2024-02-11 DIAGNOSIS — H35371 Puckering of macula, right eye: Secondary | ICD-10-CM | POA: Diagnosis not present

## 2024-02-11 DIAGNOSIS — H53143 Visual discomfort, bilateral: Secondary | ICD-10-CM | POA: Diagnosis not present

## 2024-02-11 DIAGNOSIS — H04123 Dry eye syndrome of bilateral lacrimal glands: Secondary | ICD-10-CM | POA: Diagnosis not present

## 2024-02-11 DIAGNOSIS — Z961 Presence of intraocular lens: Secondary | ICD-10-CM | POA: Diagnosis not present

## 2024-02-11 DIAGNOSIS — H4020X4 Unspecified primary angle-closure glaucoma, indeterminate stage: Secondary | ICD-10-CM | POA: Diagnosis not present

## 2024-02-15 DIAGNOSIS — R531 Weakness: Secondary | ICD-10-CM | POA: Diagnosis not present

## 2024-02-15 DIAGNOSIS — M545 Low back pain, unspecified: Secondary | ICD-10-CM | POA: Diagnosis not present

## 2024-02-22 DIAGNOSIS — M7918 Myalgia, other site: Secondary | ICD-10-CM | POA: Diagnosis not present

## 2024-02-22 DIAGNOSIS — G8929 Other chronic pain: Secondary | ICD-10-CM | POA: Diagnosis not present

## 2024-02-22 DIAGNOSIS — M47816 Spondylosis without myelopathy or radiculopathy, lumbar region: Secondary | ICD-10-CM | POA: Diagnosis not present

## 2024-02-23 DIAGNOSIS — M545 Low back pain, unspecified: Secondary | ICD-10-CM | POA: Diagnosis not present

## 2024-02-23 DIAGNOSIS — R531 Weakness: Secondary | ICD-10-CM | POA: Diagnosis not present

## 2024-03-01 ENCOUNTER — Other Ambulatory Visit: Payer: Self-pay | Admitting: Internal Medicine

## 2024-03-01 DIAGNOSIS — M545 Low back pain, unspecified: Secondary | ICD-10-CM | POA: Diagnosis not present

## 2024-03-01 DIAGNOSIS — R531 Weakness: Secondary | ICD-10-CM | POA: Diagnosis not present

## 2024-03-07 DIAGNOSIS — L304 Erythema intertrigo: Secondary | ICD-10-CM | POA: Diagnosis not present

## 2024-03-09 ENCOUNTER — Telehealth: Payer: Self-pay | Admitting: Pharmacy Technician

## 2024-03-09 ENCOUNTER — Ambulatory Visit: Attending: Cardiology | Admitting: Pharmacist

## 2024-03-09 ENCOUNTER — Encounter: Payer: Self-pay | Admitting: Pharmacist

## 2024-03-09 ENCOUNTER — Telehealth: Payer: Self-pay | Admitting: Pharmacist

## 2024-03-09 ENCOUNTER — Other Ambulatory Visit (HOSPITAL_COMMUNITY): Payer: Self-pay

## 2024-03-09 DIAGNOSIS — E782 Mixed hyperlipidemia: Secondary | ICD-10-CM | POA: Diagnosis not present

## 2024-03-09 DIAGNOSIS — E7800 Pure hypercholesterolemia, unspecified: Secondary | ICD-10-CM | POA: Diagnosis not present

## 2024-03-09 MED ORDER — REPATHA SURECLICK 140 MG/ML ~~LOC~~ SOAJ: 140.0000 mg | SUBCUTANEOUS | 3 refills | Status: DC

## 2024-03-09 NOTE — Progress Notes (Signed)
 Patient ID: Audrey Brown                 DOB: Dec 30, 1945                    MRN: [id number]      HPI: Audrey Brown is a 78 y.o. female patient referred to lipid clinic by Dr.Cooper. PMH is significant for mild nonobstructive CAD, hypertension, and chest pain,statin intolerance, parkinson depression lumbosacral spondylosis.   Patient presented with her daughter for lipid clinic. Patient was in wheelchair. Many years back patient was put on statins post hospital discharge and she broke out in hives since then she has never tried any other satins. They both don't recall the name of it. However the allergy section on patient chart reports statins intolerance due to myalgia.  Daughter has heart problem so they bothe follow pretty healthy diet and her mobility is limited due to PD. However PT come to home once a day for exercise ( 60 min session)   Reviewed options for lowering LDL cholesterol, including ezetimibe , PCSK-9 inhibitors, bempedoic acid and inclisiran.  Discussed mechanisms of action, dosing, side effects and potential decreases in LDL cholesterol.  Also reviewed cost information and potential options for patient assistance.  Current Medications: none  Intolerances: statin - myalgia  Risk Factors: nonobstructive CAD, hypertension, and chest pain,statin intolerance LDL goal: <70 mg/dl  Last lab: December 23, 2023 with a cholesterol of 210 HDL 57 LDL 124 triglycerides 144. Creatinine 1.04, potassium 4.3, ALT 8, AST 13.   Diet: low salt, low carb, and low fat diet  Eat out- once a month  Drink: ice un sweeten and water , milk - 1 glass   Social History: EtOh: none  Smoking: none  Exercise: PT - daily for 30 min   Family History:  Relation Problem Comments  Mother (Deceased) Emphysema     Father (Deceased) Heart attack     Other Hypertension   Lung cancer        Labs:  Lipid Panel     Component Value Date/Time   CHOL 225 (H) 12/25/2015 0936   TRIG 91.0 12/25/2015  0936   HDL 59.50 12/25/2015 0936   CHOLHDL 4 12/25/2015 0936   VLDL 18.2 12/25/2015 0936   LDLCALC 147 (H) 12/25/2015 0936    Past Medical History:  Diagnosis Date   Arthritis    lower back   Bipolar disorder (HCC)    Depression    GERD (gastroesophageal reflux disease)    Glaucoma    bilateral   Hiatal hernia    Loss of vision    right eye   Parkinson disease (HCC)    Poor short-term memory    Sagittal band rupture at metacarpophalangeal joint 11/2016   right long finger    Current Outpatient Medications on File Prior to Visit  Medication Sig Dispense Refill   albuterol  (VENTOLIN  HFA) 108 (90 Base) MCG/ACT inhaler Inhale 2 puffs into the lungs every 4 (four) hours as needed for wheezing or shortness of breath. 18 g 3   AMBULATORY NON FORMULARY MEDICATION Take 10 mg by mouth 3 (three) times daily. Medication Name: Domperidone 10 mg 270 capsule 3   aspirin  EC 81 MG tablet Take 1 tablet (81 mg total) by mouth daily. Swallow whole.     buPROPion  (WELLBUTRIN  XL) 300 MG 24 hr tablet Take 300 mg by mouth daily.      busPIRone  (BUSPAR ) 10 MG tablet Take 10 mg by mouth  2 (two) times daily.      Calcium Carb-Cholecalciferol  500-2.5 MG-MCG CHEW      Cholecalciferol  (VITAMIN D ) 50 MCG (2000 UT) CAPS Take 2,000 Units by mouth daily.     cyanocobalamin  (VITAMIN B12) 1000 MCG tablet Take 1,000 mcg by mouth daily.     esomeprazole  (NEXIUM ) 40 MG capsule TAKE 1 CAPSULE TWICE A DAY BEFORE MEALS 180 capsule 1   ibuprofen  (ADVIL ) 800 MG tablet TAKE 1 TABLET EVERY 8 HOURS AS NEEDED FOR PAIN 270 tablet 3   LamoTRIgine  XR (LAMICTAL  XR) 200 MG TB24 Take 1 tablet (200 mg total) by mouth daily. 90 tablet 1   losartan  (COZAAR ) 100 MG tablet Take 1 tablet (100 mg total) by mouth daily. 90 tablet 3   Magnesium  500 MG CAPS Take 400 mg by mouth at bedtime.     Melatonin 5 MG CAPS Take 5 mg by mouth at bedtime.     OSCIMIN  0.125 MG SL tablet Place under the tongue 2 (two) times daily.     oxybutynin   (DITROPAN -XL) 10 MG 24 hr tablet TAKE 1 TABLET AT BEDTIME 90 tablet 3   oxyCODONE  (OXY IR/ROXICODONE ) 5 MG immediate release tablet Take 5 mg by mouth 4 (four) times daily as needed.     promethazine  (PHENERGAN ) 25 MG tablet TAKE 1 TABLET EVERY 4 HOURS AS NEEDED FOR NAUSEA 180 tablet 3   propranolol  (INDERAL ) 10 MG tablet Take 20 mg by mouth 2 (two) times daily.     senna (SENOKOT) 8.6 MG TABS tablet Take 1 tablet by mouth at bedtime.     Teriparatide , Recombinant, (FORTEO ) 600 MCG/2.4ML SOPN Inject 2.4 mLs into the skin daily.     traMADol  (ULTRAM ) 50 MG tablet TAKE 2 TABLETS EVERY 6 HOURS AS NEEDED FOR MODERATE PAIN 720 tablet 1   traZODone (DESYREL) 100 MG tablet Take 100 mg by mouth at bedtime.     No current facility-administered medications on file prior to visit.    Allergies  Allergen Reactions   Penicillins Hives, Nausea And Vomiting, Nausea Only, Rash and Other (See Comments)   Celebrex [Celecoxib] Hives   Lisinopril  Cough   Morphine And Codeine Hives   Risperdal [Risperidone] Other (See Comments)    Tremors and seizures   Statins Other (See Comments)    MUSCLE ACHES   Hydrocodone  Hives    Pt state that she takes it with Benadryl     Assessment/Plan:  1. Hyperlipidemia -  Problem  Hypercholesteremia   Current Medications: none  Intolerances: statin - myalgia  Risk Factors: nonobstructive CAD, hypertension, and chest pain,statin intolerance LDL goal: <70 mg/dl  Last lab: December 23, 2023 with a cholesterol of 210 HDL 57 LDL 124 triglycerides 144. Creatinine 1.04, potassium 4.3, ALT 8, AST 13.     Hypercholesteremia Assessment:  LDL goal: < 70 mg/dl last LDLc 086  mg/dl (57/8469) Currently not on any lipid lowering medication   Intolerance to statins per chart - myalgia and per patient - hives- does not recall statins name  Discussed next potential options (Zetia  PCSK-9 inhibitors, bempedoic acid and inclisiran); cost, dosing efficacy, side effects  Follows heart  healthy diet   Plan: Start taking Repatha  140 mg SQ Q14 D  F/u lab due July 21,2025     Thank you,  Nickola Baron, Pharm.D [facility] of [facility] [address] [place], [place] [address]  Phone: [phone]; Fax: [phone]

## 2024-03-09 NOTE — Telephone Encounter (Signed)
 Pharmacy Patient Advocate Encounter   Received notification from Pt Calls Messages that prior authorization for Repatha  is required/requested.   Insurance verification completed.   The patient is insured through [employer] .   Per test claim: The current 03/09/24 day co-pay is, $43.00.  No PA needed at this time. This test claim was processed through [facility]- copay amounts may vary at other pharmacies due to pharmacy/plan contracts, or as the patient moves through the different stages of their insurance plan.

## 2024-03-09 NOTE — Patient Instructions (Signed)
 Your Results:             Your most recent labs Goal  Total Cholesterol 210 < 200  Triglycerides 144 < 150  HDL (happy/good cholesterol) 57 > 40  LDL (lousy/bad cholesterol 124 < 70   Medication changes: Start taking Repatha  under the skin every 14 days.    Repatha  is a cholesterol medication that improved your body's ability to get rid of "bad cholesterol" known as LDL. It can lower your LDL up to 60%! It is an injection that is given under the skin every 2 weeks. The medication often requires a prior authorization from your insurance company. We will take care of submitting all the necessary information to your insurance company to get it approved. The most common side effects of Repatha  include runny nose, symptoms of the common cold, rarely flu or flu-like symptoms, back/muscle pain in about 3-4% of the patients, and redness, pain, or bruising at the injection site.   Lab orders: We want to repeat labs after 2-3 months (July 21,2025)  We will send you a lab order to remind you once we get closer to that time.

## 2024-03-09 NOTE — Assessment & Plan Note (Signed)
 Assessment:  LDL goal: < 70 mg/dl last LDLc 409  mg/dl (81/1914) Currently not on any lipid lowering medication   Intolerance to statins per chart - myalgia and per patient - hives- does not recall statins name  Discussed next potential options (Zetia  PCSK-9 inhibitors, bempedoic acid and inclisiran); cost, dosing efficacy, side effects  Follows heart healthy diet   Plan: Start taking Repatha  140 mg SQ Q14 D  F/u lab due July 21,2025

## 2024-03-10 NOTE — Telephone Encounter (Signed)
 See office encounter for more info.

## 2024-03-27 ENCOUNTER — Ambulatory Visit: Payer: Medicare Other

## 2024-03-27 VITALS — Ht 63.0 in | Wt 170.0 lb

## 2024-03-27 DIAGNOSIS — Z0000 Encounter for general adult medical examination without abnormal findings: Secondary | ICD-10-CM

## 2024-03-27 NOTE — Patient Instructions (Addendum)
 Audrey Brown , Thank you for taking time out of your busy schedule to complete your Annual Wellness Visit with me. I enjoyed our conversation and look forward to speaking with you again next year. I, as well as your care team,  appreciate your ongoing commitment to your health goals. Please review the following plan we discussed and let me know if I can assist you in the future. Your Game plan/ To Do List    Referrals: If you haven't heard from the office you've been referred to, please reach out to them at the phone provided.   Follow up Visits: Next Medicare AWV with our clinical staff: 04/04/25 @ 1:40p   Have you seen your provider in the last 6 months (3 months if uncontrolled diabetes)? Yes 01/26/24 Next Office Visit with your provider:   Clinician Recommendations:  Aim for 30 minutes of exercise or brisk walking, 6-8 glasses of water , and 5 servings of fruits and vegetables each day.       This is a list of the screening recommended for you and due dates:  Health Maintenance  Topic Date Due   Hepatitis C Screening  Never done   DTaP/Tdap/Td vaccine (1 - Tdap) Never done   Zoster (Shingles) Vaccine (1 of 2) Never done   Colon Cancer Screening  Never done   COVID-19 Vaccine (5 - 2024-25 season) 07/11/2023   Flu Shot  06/09/2024   Medicare Annual Wellness Visit  03/27/2025   Pneumonia Vaccine  Completed   DEXA scan (bone density measurement)  Completed   HPV Vaccine  Aged Out   Meningitis B Vaccine  Aged Out  Opioid Pain Medicine Management Opioids are powerful medicines that are used to treat moderate to severe pain. When used for short periods of time, they can help you to: Sleep better. Do better in physical or occupational therapy. Feel better in the first few days after an injury. Recover from surgery. Opioids should be taken with the supervision of a trained health care provider. They should be taken for the shortest period of time possible. This is because opioids can be  addictive, and the longer you take opioids, the greater your risk of addiction. This addiction can also be called opioid use disorder. What are the risks? Using opioid pain medicines for longer than 3 days increases your risk of side effects. Side effects include: Constipation. Nausea and vomiting. Breathing difficulties (respiratory depression). Drowsiness. Confusion. Opioid use disorder. Itching. Taking opioid pain medicine for a long period of time can affect your ability to do daily tasks. It also puts you at risk for: Motor vehicle crashes. Depression. Suicide. Heart attack. Overdose, which can be life-threatening. What is a pain treatment plan? A pain treatment plan is an agreement between you and your health care provider. Pain is unique to each person, and treatments vary depending on your condition. To manage your pain, you and your health care provider need to work together. To help you do this: Discuss the goals of your treatment, including how much pain you might expect to have and how you will manage the pain. Review the risks and benefits of taking opioid medicines. Remember that a good treatment plan uses more than one approach and minimizes the chance of side effects. Be honest about the amount of medicines you take and about any drug or alcohol  use. Get pain medicine prescriptions from only one health care provider. Pain can be managed with many types of alternative treatments. Ask your health care  provider to refer you to one or more specialists who can help you manage pain through: Physical or occupational therapy. Counseling (cognitive behavioral therapy). Good nutrition. Biofeedback. Massage. Meditation. Non-opioid medicine. Following a gentle exercise program. How to use opioid pain medicine Taking medicine Take your pain medicine exactly as told by your health care provider. Take it only when you need it. If your pain gets less severe, you may take less than  your prescribed dose if your health care provider approves. If you are not having pain, do nottake pain medicine unless your health care provider tells you to take it. If your pain is severe, do nottry to treat it yourself by taking more pills than instructed on your prescription. Contact your health care provider for help. Write down the times when you take your pain medicine. It is easy to become confused while on pain medicine. Writing the time can help you avoid overdose. Take other over-the-counter or prescription medicines only as told by your health care provider. Keeping yourself and others safe  While you are taking opioid pain medicine: Do not drive, use machinery, or power tools. Do not sign legal documents. Do not drink alcohol . Do not take sleeping pills. Do not supervise children by yourself. Do not do activities that require climbing or being in high places. Do not go to a lake, river, ocean, spa, or swimming pool. Do not share your pain medicine with anyone. Keep pain medicine in a locked cabinet or in a secure area where pets and children cannot reach it. Stopping your use of opioids If you have been taking opioid medicine for more than a few weeks, you may need to slowly decrease (taper) how much you take until you stop completely. Tapering your use of opioids can decrease your risk of symptoms of withdrawal, such as: Pain and cramping in the abdomen. Nausea. Sweating. Sleepiness. Restlessness. Uncontrollable shaking (tremors). Cravings for the medicine. Do not attempt to taper your use of opioids on your own. Talk with your health care provider about how to do this. Your health care provider may prescribe a step-down schedule based on how much medicine you are taking and how long you have been taking it. Getting rid of leftover pills Do not save any leftover pills. Get rid of leftover pills safely by: Taking the medicine to a prescription take-back program. This is  usually offered by the county or law enforcement. Bringing them to a pharmacy that has a drug disposal container. Flushing them down the toilet. Check the label or package insert of your medicine to see whether this is safe to do. Throwing them out in the trash. Check the label or package insert of your medicine to see whether this is safe to do. If it is safe to throw it out, remove the medicine from the original container, put it into a sealable bag or container, and mix it with used coffee grounds, food scraps, dirt, or cat litter before putting it in the trash. Follow these instructions at home: Activity Do exercises as told by your health care provider. Avoid activities that make your pain worse. Return to your normal activities as told by your health care provider. Ask your health care provider what activities are safe for you. General instructions You may need to take these actions to prevent or treat constipation: Drink enough fluid to keep your urine pale yellow. Take over-the-counter or prescription medicines. Eat foods that are high in fiber, such as beans, whole grains, and fresh  fruits and vegetables. Limit foods that are high in fat and processed sugars, such as fried or sweet foods. Keep all follow-up visits. This is important. Where to find support If you have been taking opioids for a long time, you may benefit from receiving support for quitting from a local support group or counselor. Ask your health care provider for a referral to these resources in your area. Where to find more information Centers for Disease Control and Prevention (CDC): FootballExhibition.com.br [place] Food and Drug Administration (FDA): PumpkinSearch.com.ee Get help right away if: You may have taken too much of an opioid (overdosed). Common symptoms of an overdose: Your breathing is slower or more shallow than normal. You have a very slow heartbeat (pulse). You have slurred speech. You have nausea and vomiting. Your pupils  become very small. You have other potential symptoms: You are very confused. You faint or feel like you will faint. You have cold, clammy skin. You have blue lips or fingernails. You have thoughts of harming yourself or harming others. These symptoms may represent a serious problem that is an emergency. Do not wait to see if the symptoms will go away. Get medical help right away. Call your local emergency services (911 in the [place] Do not drive yourself to the hospital.  If you ever feel like you may hurt yourself or others, or have thoughts about taking your own life, get help right away. Go to your nearest emergency department or: Call your local emergency services (911 in the [place] Call the [facility] (5147506560 in the [place] Call a suicide crisis helpline, such as the National Suicide Prevention Lifeline at (417) 777-4844 or 988 in the [place] This is open 24 hours a day in the [place] If you're a Veteran: Call 988 and press 1. This is open 24 hours a day. Text the [employer] at 6820292238. Summary Opioid medicines can help you manage moderate to severe pain for a short period of time. A pain treatment plan is an agreement between you and your health care provider. Discuss the goals of your treatment, including how much pain you might expect to have and how you will manage the pain. If you think that you or someone else may have taken too much of an opioid, get medical help right away. This information is not intended to replace advice given to you by your health care provider. Make sure you discuss any questions you have with your health care provider. Document Revised: 08/02/2023 Document Reviewed: 02/05/2021 Elsevier Patient Education  2024 [employer]  Advanced directives: (In Chart) A copy of your advanced directives are scanned into your chart should your provider ever need it. Advance Care Planning is important because it:  [x]  Makes sure you receive  the medical care that is consistent with your values, goals, and preferences  [x]  It provides guidance to your family and loved ones and reduces their decisional burden about whether or not they are making the right decisions based on your wishes.  Follow the link provided in your after visit summary or read over the paperwork we have mailed to you to help you started getting your Advance Directives in place. If you need assistance in completing these, please reach out to us  so that we can help you!  See attachments for Preventive Care and Fall Prevention Tips.

## 2024-03-27 NOTE — Progress Notes (Signed)
 Subjective:   Audrey Brown is a 78 y.o. who presents for a Medicare Wellness preventive visit.  As a reminder, Annual Wellness Visits don't include a physical exam, and some assessments may be limited, especially if this visit is performed virtually. We may recommend an in-person follow-up visit with your provider if needed.  Visit Complete: Virtual I connected with  Audrey Brown on 03/27/24 by a audio enabled telemedicine application and verified that I am speaking with the correct person using two identifiers.  Patient Location: Home  Provider Location: Home Office  I discussed the limitations of evaluation and management by telemedicine. The patient expressed understanding and agreed to proceed.  Vital Signs: Because this visit was a virtual/telehealth visit, some criteria may be missing or patient reported. Any vitals not documented were not able to be obtained and vitals that have been documented are patient reported.    Persons Participating in Visit: Patient.  AWV Questionnaire: No: Patient Medicare AWV questionnaire was not completed prior to this visit.  Cardiac Risk Factors include: advanced age (>70men, >85 women);hypertension     Objective:     Today's Vitals   03/27/24 1338  Weight: 170 lb (77.1 kg)  Height: 5\' 3"  (1.6 m)   Body mass index is 30.11 kg/m.     03/27/2024    1:53 PM 07/19/2023    5:22 AM 03/19/2023    3:11 PM 03/25/2022   10:25 AM 11/14/2021    3:51 PM 10/04/2021   12:07 PM 03/30/2021   11:20 AM  Advanced Directives  Does Patient Have a Medical Advance Directive? Yes No Yes No Yes No No  Type of Estate agent of [place];Living will  Healthcare Power of [place];Living will  Healthcare Power of [place];Living will    Does patient want to make changes to medical advance directive? No - Patient declined  No - Patient declined  No - Patient declined    Copy of Healthcare Power of Attorney in Chart? Yes - validated most  recent copy scanned in chart (See row information)  Yes - validated most recent copy scanned in chart (See row information)  No - copy requested    Would patient like information on creating a medical advance directive?    No - Patient declined       Current Medications (verified) Outpatient Encounter Medications as of 03/27/2024  Medication Sig   albuterol  (VENTOLIN  HFA) 108 (90 Base) MCG/ACT inhaler Inhale 2 puffs into the lungs every 4 (four) hours as needed for wheezing or shortness of breath.   AMBULATORY NON FORMULARY MEDICATION Take 10 mg by mouth 3 (three) times daily. Medication Name: Domperidone 10 mg   aspirin  EC 81 MG tablet Take 1 tablet (81 mg total) by mouth daily. Swallow whole.   buPROPion  (WELLBUTRIN  XL) 300 MG 24 hr tablet Take 300 mg by mouth daily.    busPIRone  (BUSPAR ) 10 MG tablet Take 10 mg by mouth 2 (two) times daily.    Calcium Carb-Cholecalciferol  500-2.5 MG-MCG CHEW    Cholecalciferol  (VITAMIN D ) 50 MCG (2000 UT) CAPS Take 2,000 Units by mouth daily.   cyanocobalamin  (VITAMIN B12) 1000 MCG tablet Take 1,000 mcg by mouth daily.   esomeprazole  (NEXIUM ) 40 MG capsule TAKE 1 CAPSULE TWICE A DAY BEFORE MEALS   Evolocumab  (REPATHA  SURECLICK) 140 MG/ML SOAJ Inject 140 mg into the skin every 14 (fourteen) days.   ibuprofen  (ADVIL ) 800 MG tablet TAKE 1 TABLET EVERY 8 HOURS AS NEEDED FOR PAIN  LamoTRIgine  XR (LAMICTAL  XR) 200 MG TB24 Take 1 tablet (200 mg total) by mouth daily.   losartan  (COZAAR ) 100 MG tablet Take 1 tablet (100 mg total) by mouth daily.   Magnesium  500 MG CAPS Take 400 mg by mouth at bedtime.   Melatonin 5 MG CAPS Take 5 mg by mouth at bedtime.   OSCIMIN  0.125 MG SL tablet Place under the tongue 2 (two) times daily.   oxybutynin  (DITROPAN -XL) 10 MG 24 hr tablet TAKE 1 TABLET AT BEDTIME   oxyCODONE  (OXY IR/ROXICODONE ) 5 MG immediate release tablet Take 5 mg by mouth 4 (four) times daily as needed.   promethazine  (PHENERGAN ) 25 MG tablet TAKE 1 TABLET  EVERY 4 HOURS AS NEEDED FOR NAUSEA   propranolol  (INDERAL ) 10 MG tablet Take 20 mg by mouth 2 (two) times daily.   senna (SENOKOT) 8.6 MG TABS tablet Take 1 tablet by mouth at bedtime.   Teriparatide , Recombinant, (FORTEO ) 600 MCG/2.4ML SOPN Inject 2.4 mLs into the skin daily.   traMADol  (ULTRAM ) 50 MG tablet TAKE 2 TABLETS EVERY 6 HOURS AS NEEDED FOR MODERATE PAIN   traZODone (DESYREL) 100 MG tablet Take 100 mg by mouth at bedtime.   No facility-administered encounter medications on file as of 03/27/2024.    Allergies (verified) Penicillins, Celebrex [celecoxib], Lisinopril , Morphine and codeine, Risperdal [risperidone], Statins, and Hydrocodone    History: Past Medical History:  Diagnosis Date   Arthritis    lower back   Bipolar disorder (HCC)    Depression    GERD (gastroesophageal reflux disease)    Glaucoma    bilateral   Hiatal hernia    Loss of vision    right eye   Parkinson disease (HCC)    Poor short-term memory    Sagittal band rupture at metacarpophalangeal joint 11/2016   right long finger   Past Surgical History:  Procedure Laterality Date   ANKLE SURGERY     BALLOON DILATION N/A 10/29/2017   Procedure: BALLOON DILATION;  Surgeon: Nannette Babe, MD;  Location: [facility];  Service: Gastroenterology;  Laterality: N/A;   CATARACT EXTRACTION Right 08/07/2020   CERVICAL DISC SURGERY  1994   CESAREAN SECTION     CHOLECYSTECTOMY     CYST REMOVAL NECK     ESOPHAGEAL DILATION  12/19/2018   Procedure: ESOPHAGEAL DILATION;  Surgeon: Nannette Babe, MD;  Location: [facility];  Service: Gastroenterology;;   ESOPHAGOGASTRODUODENOSCOPY (EGD) WITH ESOPHAGEAL DILATION  07/27/2012   with Propofol    ESOPHAGOGASTRODUODENOSCOPY (EGD) WITH PROPOFOL  N/A 06/23/2016   Procedure: ESOPHAGOGASTRODUODENOSCOPY (EGD) WITH PROPOFOL ;  Surgeon: Nannette Babe, MD;  Location: [facility];  Service: Gastroenterology;  Laterality: N/A;   ESOPHAGOGASTRODUODENOSCOPY (EGD) WITH PROPOFOL  N/A  10/29/2017   Procedure: ESOPHAGOGASTRODUODENOSCOPY (EGD) WITH PROPOFOL ;  Surgeon: Nannette Babe, MD;  Location: [facility];  Service: Gastroenterology;  Laterality: N/A;   ESOPHAGOGASTRODUODENOSCOPY (EGD) WITH PROPOFOL  N/A 12/19/2018   Procedure: ESOPHAGOGASTRODUODENOSCOPY (EGD) WITH PROPOFOL ;  Surgeon: Nannette Babe, MD;  Location: [facility];  Service: Gastroenterology;  Laterality: N/A;   EYE SURGERY     EYE SURGERY  09/2019   R eye procedure to relieve pressure   EYE SURGERY     FOOT NEUROMA SURGERY Left    LEFT HEART CATH AND CORONARY ANGIOGRAPHY N/A 07/19/2023   Procedure: LEFT HEART CATH AND CORONARY ANGIOGRAPHY;  Surgeon: Lucendia Rusk, MD;  Location: [facility];  Service: Cardiovascular;  Laterality: N/A;   ORIF DISTAL RADIUS FRACTURE Right 07/04/2010   SAVORY DILATION  N/A 06/23/2016   Procedure: SAVORY DILATION;  Surgeon: Nannette Babe, MD;  Location: [facility];  Service: Gastroenterology;  Laterality: N/A;   TENDON REPAIR Right 12/01/2016   Procedure: right long finger ulnar sagittal band reconstruction;  Surgeon: Brunilda Capra, MD;  Location: [facility];  Service: Orthopedics;  Laterality: Right;   TONSILLECTOMY     Family History  Problem Relation Age of Onset   Emphysema Mother    Heart attack Father    Hypertension Other    Lung cancer Other    Colon cancer Neg Hx    Esophageal cancer Neg Hx    Rectal cancer Neg Hx    Stomach cancer Neg Hx    Colon polyps Neg Hx    Social History   Socioeconomic History   Marital status: Widowed    Spouse name: Not on file   Number of children: 4   Years of education: 6 years college   Highest education level: Master's degree (e.g., MA, MS, MEng, MEd, MSW, MBA)  Occupational History   Occupation: Retired    Associate Professor: RETIRED    Comment: [employer]  Tobacco Use   Smoking status: Never   Smokeless tobacco: Never  Vaping Use   Vaping status: Never Used  Substance and Sexual Activity   Alcohol  use: No     Alcohol /week: 0.0 standard drinks of alcohol    Drug use: No   Sexual activity: Not Currently  Other Topics Concern   Not on file  Social History Narrative   Daughter is her POA and must come to all visits in GI and [facility]       Patient is right-handed. She lives with her daughter and granddaughter in a split level home with a chair lift. Uses a power chair. She drinks 3-4 cups of coffee a day.      Has 4 daughters, 3 grand children, and 5 great-grand children.   Social Drivers of Corporate investment banker Strain: Low Risk  (03/27/2024)   Overall Financial Resource Strain (CARDIA)    Difficulty of Paying Living Expenses: Not hard at all  Food Insecurity: No Food Insecurity (03/27/2024)   Hunger Vital Sign    Worried About Running Out of Food in the Last Year: Never true    Ran Out of Food in the Last Year: Never true  Transportation Needs: No Transportation Needs (03/27/2024)   PRAPARE - Administrator, Civil Service (Medical): No    Lack of Transportation (Non-Medical): No  Physical Activity: Insufficiently Active (03/27/2024)   Exercise Vital Sign    Days of Exercise per Week: 5 days    Minutes of Exercise per Session: 20 min  Stress: No Stress Concern Present (03/27/2024)   [employer] of Occupational Health - Occupational Stress Questionnaire    Feeling of Stress : Not at all  Social Connections: Moderately Integrated (03/27/2024)   Social Connection and Isolation Panel [NHANES]    Frequency of Communication with Friends and Family: More than three times a week    Frequency of Social Gatherings with Friends and Family: More than three times a week    Attends Religious Services: More than 4 times per year    Active Member of [employer] or Organizations: Yes    Attends Banker Meetings: More than 4 times per year    Marital Status: Widowed    Tobacco Counseling Counseling given: Not Answered    Clinical Intake:  Pre-visit preparation completed:  Yes  Pain :  No/denies pain     BMI - recorded: 30.11 Nutritional Status: BMI > 30  Obese Nutritional Risks: None Diabetes: No  No results found for: "HGBA1C"   How often do you need to have someone help you when you read instructions, pamphlets, or other written materials from your doctor or pharmacy?: 1 - Never  Interpreter Needed?: No  Information entered by :: Farris Hong LPN   Activities of Daily Living     03/27/2024    1:46 PM  In your present state of health, do you have any difficulty performing the following activities:  Hearing? 0  Vision? 0  Difficulty concentrating or making decisions? 1  Comment Dx: Dementia  Walking or climbing stairs? 0  Dressing or bathing? 1  Comment Daughter and Aide assist  Doing errands, shopping? 1  Comment Daughter and Aide Ship broker and eating ? Y  Comment Daughter and Aide assist  Using the Toilet? N  In the past six months, have you accidently leaked urine? Y  Comment Followed by medical attention  Do you have problems with loss of bowel control? Y  Comment Followed by medical attention  Managing your Medications? Y  Comment Daughter and Aide assist  Managing your Finances? Y  Comment Daughter and Aide assist  Housekeeping or managing your Housekeeping? Y  Comment Daughter and Aide assist    Patient Care Team: Donley Furth, MD as PCP - Jan Mcgill, MD as PCP - Cardiology (Cardiology) Merriam Abbey, DO as Consulting Physician (Neurology) Carnell Christian, [facility] (Pharmacist)  Indicate any recent Medical Services you may have received from other than [facility] providers in the past year (date may be approximate).     Assessment:    This is a routine wellness examination for Audrey Brown.  Hearing/Vision screen Hearing Screening - Comments:: Denies hearing difficulties   Vision Screening - Comments:: Wears rx glasses - up to date with routine eye exams with  [facility]   Goals Addressed                This Visit's Progress     Increase physical activity (pt-stated)         Depression Screen     01/26/2024   10:47 AM 03/19/2023    3:07 PM 10/23/2022   11:51 AM 07/20/2022    2:34 PM 03/02/2022    5:34 PM 02/05/2022    8:51 AM 11/14/2021    3:38 PM  PHQ 2/9 Scores  PHQ - 2 Score 0 0 1 1 1 2  0  PHQ- 9 Score 6  14 13 13 18  0    Fall Risk     03/27/2024    1:52 PM 01/26/2024   10:47 AM 03/19/2023    3:09 PM 11/21/2022    7:38 PM 07/20/2022    2:33 PM  Fall Risk   Falls in the past year? 1 0 1 1 1   Number falls in past yr: 0 0 0 1 1  Injury with Fall? 0 0 1 1 1   Comment   Fx vertebrae. Followed by medical attention.  back  Risk for fall due to : No Fall Risks  Orthopedic patient  History of fall(s);Impaired balance/gait  Follow up Falls prevention discussed;Falls evaluation completed Falls evaluation completed Falls prevention discussed  Falls evaluation completed    MEDICARE RISK AT HOME:  Medicare Risk at Home Any stairs in or around the home?: No If so, are there any without handrails?:  No Home free of loose throw rugs in walkways, pet beds, electrical cords, etc?: Yes Adequate lighting in your home to reduce risk of falls?: Yes Life alert?: Yes Use of a cane, walker or w/c?: Yes Grab bars in the bathroom?: Yes Shower chair or bench in shower?: Yes Elevated toilet seat or a handicapped toilet?: Yes  TIMED UP AND GO:  Was the test performed?  No  Cognitive Function: 6CIT completed        03/27/2024    1:53 PM 03/19/2023    3:11 PM 11/14/2021    3:48 PM 10/25/2019   10:35 AM  6CIT Screen  What Year? 0 points 0 points 0 points 0 points  What month? 0 points 0 points 0 points 0 points  What time? 0 points 0 points 0 points 0 points  Count back from 20 0 points 0 points 0 points 0 points  Months in reverse 0 points 0 points 0 points 0 points  Repeat phrase 0 points 0 points 0 points 4 points  Total Score 0 points 0 points 0 points 4 points     Immunizations Immunization History  Administered Date(s) Administered   Fluad Quad(high Dose 65+) 12/23/2020, 10/16/2021, 07/20/2022   Influenza Split 08/02/2013   Influenza, High Dose Seasonal PF 07/30/2015, 07/20/2017, 08/23/2018, 08/10/2019   Influenza-Unspecified 08/08/2014, 07/27/2016   MODERNA COVID-19 SARS-COV-2 PEDS BIVALENT BOOSTER 40yr-62yr 01/21/2021   Moderna Covid-19 Fall Seasonal Vaccine 46yrs & older 09/21/2022   Moderna Sars-Covid-2 Vaccination 02/12/2020, 03/11/2020   Pneumococcal Conjugate-13 12/25/2015   Pneumococcal Polysaccharide-23 08/23/2018    Screening Tests Health Maintenance  Topic Date Due   Hepatitis C Screening  Never done   DTaP/Tdap/Td (1 - Tdap) Never done   Zoster Vaccines- Shingrix (1 of 2) Never done   Colonoscopy  Never done   COVID-19 Vaccine (5 - 2024-25 season) 07/11/2023   INFLUENZA VACCINE  06/09/2024   Medicare Annual Wellness (AWV)  03/27/2025   Pneumonia Vaccine 68+ Years old  Completed   DEXA SCAN  Completed   HPV VACCINES  Aged Out   Meningococcal B Vaccine  Aged Out    Health Maintenance  Health Maintenance Due  Topic Date Due   Hepatitis C Screening  Never done   DTaP/Tdap/Td (1 - Tdap) Never done   Zoster Vaccines- Shingrix (1 of 2) Never done   Colonoscopy  Never done   COVID-19 Vaccine (5 - 2024-25 season) 07/11/2023   Health Maintenance Items Addressed: Patient declined Colonoscopy.  Hep-C Screening and vaccines deferred  Additional Screening:  Vision Screening: Recommended annual ophthalmology exams for early detection of glaucoma and other disorders of the eye.  Dental Screening: Recommended annual dental exams for proper oral hygiene  Community Resource Referral / Chronic Care Management: CRR required this visit?  No   CCM required this visit?  No   Plan:    I have personally reviewed and noted the following in the patient's chart:   Medical and social history Use of alcohol , tobacco or illicit  drugs  Current medications and supplements including opioid prescriptions. Patient is currently taking opioid prescriptions. Information provided to patient regarding non-opioid alternatives. Patient advised to discuss non-opioid treatment plan with their provider. Functional ability and status Nutritional status Physical activity Advanced directives List of other physicians Hospitalizations, surgeries, and ER visits in previous 12 months Vitals Screenings to include cognitive, depression, and falls Referrals and appointments  In addition, I have reviewed and discussed with patient certain preventive protocols, quality metrics, and best  practice recommendations. A written personalized care plan for preventive services as well as general preventive health recommendations were provided to patient.   Dewayne Ford, LPN   1/61/0960   After Visit Summary: (MyChart) Due to this being a telephonic visit, the after visit summary with patients personalized plan was offered to patient via MyChart   Notes: Nothing significant to report at this time.

## 2024-04-18 DIAGNOSIS — L304 Erythema intertrigo: Secondary | ICD-10-CM | POA: Diagnosis not present

## 2024-05-09 ENCOUNTER — Ambulatory Visit: Admitting: Family Medicine

## 2024-05-10 ENCOUNTER — Encounter: Payer: Self-pay | Admitting: Family Medicine

## 2024-05-10 ENCOUNTER — Ambulatory Visit: Admitting: Family Medicine

## 2024-05-10 VITALS — BP 110/64 | HR 66 | Temp 98.4°F | Wt 170.0 lb

## 2024-05-10 DIAGNOSIS — J40 Bronchitis, not specified as acute or chronic: Secondary | ICD-10-CM | POA: Diagnosis not present

## 2024-05-10 MED ORDER — METHYLPREDNISOLONE 4 MG PO TBPK: ORAL_TABLET | ORAL | 0 refills | Status: DC

## 2024-05-10 MED ORDER — HYDROCODONE BIT-HOMATROP MBR 5-1.5 MG/5ML PO SOLN: 5.0000 mL | ORAL | 0 refills | Status: DC | PRN

## 2024-05-10 MED ORDER — ALPRAZOLAM 0.25 MG PO TABS: 0.2500 mg | ORAL_TABLET | Freq: Three times a day (TID) | ORAL | 1 refills | Status: AC | PRN

## 2024-05-10 MED ORDER — LEVOFLOXACIN 500 MG PO TABS: 500.0000 mg | ORAL_TABLET | Freq: Every day | ORAL | 0 refills | Status: AC

## 2024-05-10 NOTE — Progress Notes (Signed)
   Subjective:    Patient ID: Audrey Brown, female    DOB: 08/12/1946, 78 y.o.   MRN: [id number]  HPI Here with her daughter for one week of chest tightness and a dry cough. Some SOB but no chest pain or fever.    Review of Systems  Constitutional: Negative.   HENT: Negative.    Eyes: Negative.   Respiratory:  Positive for cough, chest tightness and shortness of breath. Negative for wheezing.        Objective:   Physical Exam Constitutional:      Appearance: She is not ill-appearing.     Comments: In a wheelchair   HENT:     Right Ear: Tympanic membrane, ear canal and external ear normal.     Left Ear: Tympanic membrane, ear canal and external ear normal.     Nose: Nose normal.     Mouth/Throat:     Pharynx: Oropharynx is clear.  Eyes:     Conjunctiva/sclera: Conjunctivae normal.  Pulmonary:     Effort: Pulmonary effort is normal.     Breath sounds: Rhonchi present. No wheezing or rales.  Lymphadenopathy:     Cervical: No cervical adenopathy.  Neurological:     Mental Status: She is alert.           Assessment & Plan:  Bronchitis, treat with 10 days of Levaquin  and a Medrol  dose pack.  Garnette Olmsted, MD

## 2024-05-11 ENCOUNTER — Other Ambulatory Visit: Payer: Self-pay | Admitting: Family Medicine

## 2024-05-25 DIAGNOSIS — M533 Sacrococcygeal disorders, not elsewhere classified: Secondary | ICD-10-CM | POA: Diagnosis not present

## 2024-05-25 DIAGNOSIS — Z9689 Presence of other specified functional implants: Secondary | ICD-10-CM | POA: Diagnosis not present

## 2024-05-25 DIAGNOSIS — S22000S Wedge compression fracture of unspecified thoracic vertebra, sequela: Secondary | ICD-10-CM | POA: Diagnosis not present

## 2024-05-29 DIAGNOSIS — M533 Sacrococcygeal disorders, not elsewhere classified: Secondary | ICD-10-CM | POA: Diagnosis not present

## 2024-05-31 ENCOUNTER — Telehealth: Payer: Self-pay | Admitting: Pharmacist

## 2024-05-31 NOTE — Telephone Encounter (Signed)
 Call to follow up on Repatha . Patient states she is tolerating it well. She have has more than 5 injections. Will be going for lab on July 25,2025. Will f/u on lab result on July 28.

## 2024-06-13 ENCOUNTER — Other Ambulatory Visit: Payer: Self-pay | Admitting: Family Medicine

## 2024-06-20 DIAGNOSIS — E782 Mixed hyperlipidemia: Secondary | ICD-10-CM | POA: Diagnosis not present

## 2024-06-20 LAB — LIPID PANEL
Chol/HDL Ratio: 2.2 ratio (ref 0.0–4.4)
Cholesterol, Total: 139 mg/dL (ref 100–199)
HDL: 62 mg/dL (ref >39)
LDL Chol Calc (NIH): 63 mg/dL (ref 0–99)
Triglycerides: 73 mg/dL (ref 0–149)
VLDL Cholesterol Cal: 14 mg/dL (ref 5–40)

## 2024-06-22 ENCOUNTER — Ambulatory Visit: Payer: Self-pay | Admitting: Pharmacist

## 2024-06-22 DIAGNOSIS — E782 Mixed hyperlipidemia: Secondary | ICD-10-CM

## 2024-06-26 ENCOUNTER — Ambulatory Visit: Payer: Self-pay

## 2024-06-26 DIAGNOSIS — J029 Acute pharyngitis, unspecified: Secondary | ICD-10-CM | POA: Diagnosis not present

## 2024-06-26 DIAGNOSIS — R300 Dysuria: Secondary | ICD-10-CM | POA: Diagnosis not present

## 2024-06-26 DIAGNOSIS — H66003 Acute suppurative otitis media without spontaneous rupture of ear drum, bilateral: Secondary | ICD-10-CM | POA: Diagnosis not present

## 2024-06-26 DIAGNOSIS — R110 Nausea: Secondary | ICD-10-CM | POA: Diagnosis not present

## 2024-06-26 DIAGNOSIS — H6123 Impacted cerumen, bilateral: Secondary | ICD-10-CM | POA: Diagnosis not present

## 2024-06-26 NOTE — Telephone Encounter (Signed)
 FYI Only or Action Required?: Action required by provider: sent to UC for dizziness/nausea, ear clogged.  Patient was last seen in primary care on 05/10/2024 by Audrey Garnette LABOR, MD.  Called Nurse Triage reporting Dizziness.  Symptoms began several days ago.  Interventions attempted: Nothing.  Symptoms are: gradually worsening.  Triage Disposition: See PCP When Office is Open (Within 3 Days)  Patient/caregiver understands and will follow disposition?: Yes      Copied from CRM #[id number]. Topic: Clinical - Red Word Triage >> Jun 26, 2024  9:31 AM [place] MATSU wrote: Red Word that prompted transfer to Nurse Triage: dizzy sore throat , nausea,  possible ear infection, ,clogged or like water  it, patient not sure what's going on is requesting an appointment to be seen. Reason for Disposition  [1] MODERATE dizziness (e.g., vertigo; feels very unsteady, interferes with normal activities) AND [2] has been evaluated by doctor (or NP/PA) for this  Answer Assessment - Initial Assessment Questions Started few days ago, happens with movement, lay down and get up, every time movement, BP yesterday 130/64; with med 1. DESCRIPTION: Describe your dizziness.     Continuing dizziness 2. VERTIGO: Do you feel like either you or the room is spinning or tilting?      no 3. LIGHTHEADED: Do you feel lightheaded? (e.g., somewhat faint, woozy, weak upon standing)     woozy 4. SEVERITY: How bad is it?  Can you walk?     na 5. ONSET:  When did the dizziness begin?     Few days ago 6. AGGRAVATING FACTORS: Does anything make it worse? (e.g., standing, change in head position)     movement 7. CAUSE: What do you think is causing the dizziness?     mov 8. RECURRENT SYMPTOM: Have you had dizziness before? If Yes, ask: When was the last time? What happened that time?     No; new symptonm 9. OTHER SYMPTOMS: Do you have any other symptoms? (e.g., earache, headache, numbness, tinnitus, vomiting,  weakness)     Dry heaving, nausea, sore throat, earache Sorethroat-1 week; low grade 99.0, yesterday Left ear; no pain or drainage, feeling clogged No vomiting numbness, weakness; is about the same numbness and weakness  Protocols used: Dizziness - Vertigo-A-AH

## 2024-06-27 NOTE — Telephone Encounter (Signed)
 Spoke with pt states that she went to [facility], pt states that she we prescribed Antibiotics which is helping.

## 2024-07-03 ENCOUNTER — Telehealth: Payer: Self-pay | Admitting: Internal Medicine

## 2024-07-03 DIAGNOSIS — M7918 Myalgia, other site: Secondary | ICD-10-CM | POA: Diagnosis not present

## 2024-07-03 DIAGNOSIS — G8929 Other chronic pain: Secondary | ICD-10-CM | POA: Diagnosis not present

## 2024-07-03 DIAGNOSIS — Z9689 Presence of other specified functional implants: Secondary | ICD-10-CM | POA: Diagnosis not present

## 2024-07-03 DIAGNOSIS — M47816 Spondylosis without myelopathy or radiculopathy, lumbar region: Secondary | ICD-10-CM | POA: Diagnosis not present

## 2024-07-03 MED ORDER — REPATHA SURECLICK 140 MG/ML ~~LOC~~ SOAJ: 140.0000 mg | SUBCUTANEOUS | 3 refills | Status: AC

## 2024-07-03 NOTE — Telephone Encounter (Signed)
 Patient calls stating that she has been on domperidone for years which she gets from [place]. She states however, that [place] will no longer send domperidone to the [place] She is requesting alternative options.  She has not previously tried reglan  but does have parkinsons and has been told that reglan  would likely exacerbate her tremors.  Please advise.[email or web address]

## 2024-07-03 NOTE — Telephone Encounter (Signed)
 We cannot get domperidone in the [place]   We could try low-dose metoclopramide  2.5 mg 3 times daily to see if she could tolerate this medication without exacerbation of neurologic symptoms If she would prefer we could get clearance from her neurologist; can we please clarify who her neurologist is?

## 2024-07-03 NOTE — Telephone Encounter (Signed)
 Inbound call from patient stating that she is having a problem with her medication esomeprazole   and is requesting to speak with nurse. Please advise.

## 2024-07-04 NOTE — Telephone Encounter (Signed)
 Patient states she would be willing to try low dose metoclopramide .  Her neurologist is Dr Corinthia Public with the [facility] (phone [phone]).   If approved to take metoclopramide , she would like rx sent to [facility].  Dr Albertus, I have created a note to send to Dr Public. Would you please review and make any changes needed before I send it out?

## 2024-07-05 NOTE — Telephone Encounter (Signed)
 Letter revised and routed back to you regarding metoclopramide  trial and neurology opinion

## 2024-07-05 NOTE — Telephone Encounter (Signed)
 Letter sent electronically to Dr Corinthia Public at [facility]. Will await response.

## 2024-07-17 ENCOUNTER — Encounter: Payer: Self-pay | Admitting: Family Medicine

## 2024-07-17 ENCOUNTER — Ambulatory Visit (INDEPENDENT_AMBULATORY_CARE_PROVIDER_SITE_OTHER): Admitting: Family Medicine

## 2024-07-17 ENCOUNTER — Telehealth: Payer: Self-pay | Admitting: Internal Medicine

## 2024-07-17 VITALS — BP 142/78 | HR 65 | Temp 97.7°F | Wt 170.0 lb

## 2024-07-17 DIAGNOSIS — J019 Acute sinusitis, unspecified: Secondary | ICD-10-CM

## 2024-07-17 DIAGNOSIS — R42 Dizziness and giddiness: Secondary | ICD-10-CM | POA: Diagnosis not present

## 2024-07-17 MED ORDER — LEVOFLOXACIN 500 MG PO TABS: 500.0000 mg | ORAL_TABLET | Freq: Every day | ORAL | 0 refills | Status: AC

## 2024-07-17 MED ORDER — HYDROCODONE BIT-HOMATROP MBR 5-1.5 MG/5ML PO SOLN: 5.0000 mL | ORAL | 0 refills | Status: DC | PRN

## 2024-07-17 NOTE — Telephone Encounter (Signed)
 Inbound call from pt stating that she is no longer able to receive the medication Domperidone. Patient is requesting to get a prescription for the alternative of Domperidone. Please advise.

## 2024-07-17 NOTE — Progress Notes (Signed)
   Subjective:    Patient ID: Audrey Brown, female    DOB: 1945-12-28, 78 y.o.   MRN: [id number]  HPI Here to follow up a visit to an [facility] urgent care on 06-26-24 when she presented with head congestion, ear pain, dizziness, and a dry cough. She says this started about 4 weeks ago. She says the dizziness makes it seem like the room is spinning, and it makes her nauseated. No fever. At the urgent care, she was treated with Keflex, Meclizine, and Cortisporin ear drops. Todat she says her ears feel better but the dizziness and cough persist.    Review of Systems  Constitutional: Negative.   HENT:  Positive for congestion, postnasal drip and sinus pressure. Negative for ear pain and sore throat.   Eyes: Negative.   Respiratory:  Positive for cough. Negative for shortness of breath and wheezing.   Gastrointestinal:  Positive for nausea. Negative for abdominal pain, constipation, diarrhea and vomiting.       Objective:   Physical Exam Constitutional:      Appearance: She is not ill-appearing.  HENT:     Right Ear: Tympanic membrane, ear canal and external ear normal.     Left Ear: Tympanic membrane, ear canal and external ear normal.     Nose: Nose normal.     Mouth/Throat:     Pharynx: Oropharynx is clear.  Eyes:     Conjunctiva/sclera: Conjunctivae normal.  Pulmonary:     Effort: Pulmonary effort is normal.     Breath sounds: Normal breath sounds.  Lymphadenopathy:     Cervical: No cervical adenopathy.  Neurological:     Mental Status: She is alert.           Assessment & Plan:  She has a partially treated sinusitis that is also causing her to have vertigo. We will treat this with 10 days of Levaquin . She can use the Meclizine as needed. She can add Mucinex as well. We spent a total of ( 32  ) minutes reviewing records and discussing these issues.  Garnette Olmsted, MD

## 2024-07-17 NOTE — Telephone Encounter (Signed)
 See additional phone note dated 07/03/24.

## 2024-07-18 NOTE — Telephone Encounter (Signed)
 Resent letter to Dr Corinthia Public.

## 2024-07-24 DIAGNOSIS — M7918 Myalgia, other site: Secondary | ICD-10-CM | POA: Diagnosis not present

## 2024-07-26 NOTE — Telephone Encounter (Signed)
 I have contacted Dr Lona office by phone to inquire about metoclopramide  clearance for patient. Jeraldene says that she will send a message to Dr Armida about this.  [facility] Phone: [phone] ext [phone]

## 2024-08-02 NOTE — Telephone Encounter (Signed)
 Left message for patient to call back

## 2024-08-02 NOTE — Telephone Encounter (Signed)
 Dr Albertus-  I have made multiple attempts to reach out to patient's neurologist, Dr Armida regarding whether she may start low dose metoclopramide . I have yet to receive a response and it has now been 1 month.   Would you recommend going forward with Rx or continuing to wait on possible response from Dr Armida with the [facility]?

## 2024-08-02 NOTE — Telephone Encounter (Signed)
 Spoke to patient to advise that we have reached out multiple times to her neurologist with the [facility] but have not gotten a response over the last 1 month in regards to her starting metoclopramide . We discussed that Dr Albertus has given the go ahead for her to start low dose reglan  with very close observation in the absence of any other medication to help gastroparesis. Patient states that she actually has an appointment with Dr Armida on Monday and will discuss this with him at that time. States she will reach back out to us  and if okay with Dr Armida would like rx sent to [facility].

## 2024-08-02 NOTE — Telephone Encounter (Signed)
 Any side effect would be short-lived if the medication is stopped. Would just recommend close observation Okay to try metoclopramide  5 mg 3 times daily AC as needed

## 2024-08-08 MED ORDER — METOCLOPRAMIDE HCL 5 MG PO TABS: 5.0000 mg | ORAL_TABLET | Freq: Three times a day (TID) | ORAL | 0 refills | Status: DC

## 2024-08-08 NOTE — Addendum Note (Signed)
 Addended by: CLAUDENE NAOMIE SAILOR on: 08/08/2024 10:39 AM   Modules accepted: Orders

## 2024-08-08 NOTE — Telephone Encounter (Signed)
 Patient requesting f/u call in regards to previous note. Please advise.   Thank you

## 2024-08-08 NOTE — Telephone Encounter (Signed)
 Received a call from Dr Vela nurse with [facility] moments ago indicating that Dr Armida has given the okay for patient to begin metoclopramide  5 mg since patient has essential tremors rather than parkinsons.   I have spoken with patient and she also relays this information.   Metoclopramide  rx sent to [facility] and patient is advised should she have side effects, she should stop the medication and call our office.

## 2024-08-11 ENCOUNTER — Telehealth: Payer: Self-pay | Admitting: Internal Medicine

## 2024-08-11 MED ORDER — ESOMEPRAZOLE MAGNESIUM 40 MG PO CPDR: DELAYED_RELEASE_CAPSULE | ORAL | 1 refills | Status: AC

## 2024-08-11 NOTE — Telephone Encounter (Signed)
 Inbound call from patient stating she is in need of refill on medication Nexium . Would like it sent through [facility] home delivery Please advise  Thank you

## 2024-08-11 NOTE — Telephone Encounter (Signed)
 Prescription for Nexium  sent to patient's mail order pharmacy. Patient notified and patient verbalized understanding.

## 2024-08-15 ENCOUNTER — Ambulatory Visit: Payer: Self-pay

## 2024-08-15 ENCOUNTER — Ambulatory Visit (INDEPENDENT_AMBULATORY_CARE_PROVIDER_SITE_OTHER): Admitting: Family Medicine

## 2024-08-15 ENCOUNTER — Encounter: Payer: Self-pay | Admitting: Family Medicine

## 2024-08-15 VITALS — BP 136/82 | HR 65 | Temp 97.7°F | Wt 170.0 lb

## 2024-08-15 DIAGNOSIS — S50811A Abrasion of right forearm, initial encounter: Secondary | ICD-10-CM | POA: Diagnosis not present

## 2024-08-15 DIAGNOSIS — Z23 Encounter for immunization: Secondary | ICD-10-CM

## 2024-08-15 DIAGNOSIS — W5503XA Scratched by cat, initial encounter: Secondary | ICD-10-CM | POA: Diagnosis not present

## 2024-08-15 MED ORDER — CIPROFLOXACIN HCL 500 MG PO TABS: 500.0000 mg | ORAL_TABLET | Freq: Two times a day (BID) | ORAL | 0 refills | Status: DC

## 2024-08-15 NOTE — Telephone Encounter (Signed)
 FYI Only or Action Required?: FYI only for provider.  Patient was last seen in primary care on 07/17/2024 by Audrey Brown LABOR, MD.  Called Nurse Triage reporting Animal Bite.  Symptoms began yesterday.  Interventions attempted: Rest, hydration, or home remedies.  Symptoms are: unchanged.  Triage Disposition: No disposition on file.  Patient/caregiver understands and will follow disposition?:   Copied from CRM [id number]. Topic: Clinical - Red Word Triage >> Aug 15, 2024 12:30 PM Audrey Brown wrote: Red Word that prompted transfer to Nurse Triage: cat bite, requesting an antibiotic Reason for Disposition  [1] Puncture wound (hole through the skin) AND [2] from a cat bite (or deep claw puncture wound)  Answer Assessment - Initial Assessment Questions Cat bite years ago turned to gangrene; yesterday morning pt was petting her cat and he suddenly turned around and scratched and bit both arms. Abrasions to R arm are very red, and bite puncture would to L arm is red. Pt requesting abx. Cat UTD on all vaccines, Pt UTD tetanus. Scheduled for provider at 1315 this afternoon as pt states she's already in the area of the office.    1. APPEARANCE What does it look like?  (e.g., abrasion, bruise, puncture)      Abrasions to R arm very red, bite to the L arm red  2. SIZE: How big is the bite? (e.g., inches, cm; or compare to size of coin, pea, grape, ping pong ball)      Abrasions; size of quarter//Bite; puncture wound 3. LOCATION: Where is the bite located?      Bilat arms 4. ONSET: When did the bite happen? (e.g., minutes, hours ago)      Yesterday morning 5. ANIMAL: What type of animal caused the bite? Is the injury from a bite or a claw? If the animal is a dog or a cat, ask: Was it a pet or a stray? Was it acting ill or behaving strangely?     Cat;  6. RABIES VACCINE: For dog or cat bites, ask: Do you know if the pet is vaccinated against rabies?  (e.g., yes, no, overdue for rabies  shot, unknown)     Yes; all vaccines 7. CIRCUMSTANCES: Tell me how this happened.      Was petting and then cat suddenly changed attitude 8. TETANUS: When was your last tetanus booster?     Two years ago  Protocols used: Animal Bite-A-AH

## 2024-08-15 NOTE — Addendum Note (Signed)
 Addended by: LADONNA INOCENTE SAILOR on: 08/15/2024 02:14 PM   Modules accepted: Orders

## 2024-08-15 NOTE — Progress Notes (Signed)
   Subjective:    Patient ID: Audrey Brown, female    DOB: 1946/01/11, 78 y.o.   MRN: [id number]  HPI She was playing with he cat yesterday when the cat scratched her right forearm. She dressed it with Neosporin and a Bandaid. Today it has some redness around it. No pain.    Review of Systems  Constitutional: Negative.   Respiratory: Negative.    Cardiovascular: Negative.   Skin:  Positive for wound.       Objective:   Physical Exam Constitutional:      Appearance: Normal appearance.  Cardiovascular:     Rate and Rhythm: Normal rate and regular rhythm.     Pulses: Normal pulses.     Heart sounds: Normal heart sounds.  Pulmonary:     Effort: Pulmonary effort is normal.     Breath sounds: Normal breath sounds.  Skin:    Comments: There is a superficial 1 cm scratch on her dorsal right forearm. There is slight erythema. Not tender   Neurological:     Mental Status: She is alert.           Assessment & Plan:  Cat scratch, treat with 7 days of Cipro. Recheck as needed. Given a TDaP vaccine.  Garnette Olmsted, MD

## 2024-08-16 NOTE — Telephone Encounter (Signed)
 Pt was seen by Dr Johnny in the office on 08/15/24 for this problem

## 2024-08-21 DIAGNOSIS — M7918 Myalgia, other site: Secondary | ICD-10-CM | POA: Diagnosis not present

## 2024-08-21 DIAGNOSIS — Z9689 Presence of other specified functional implants: Secondary | ICD-10-CM | POA: Diagnosis not present

## 2024-08-21 DIAGNOSIS — M533 Sacrococcygeal disorders, not elsewhere classified: Secondary | ICD-10-CM | POA: Diagnosis not present

## 2024-08-21 DIAGNOSIS — G8929 Other chronic pain: Secondary | ICD-10-CM | POA: Diagnosis not present

## 2024-08-21 DIAGNOSIS — M47816 Spondylosis without myelopathy or radiculopathy, lumbar region: Secondary | ICD-10-CM | POA: Diagnosis not present

## 2024-09-04 ENCOUNTER — Other Ambulatory Visit: Payer: Self-pay | Admitting: Family Medicine

## 2024-09-04 ENCOUNTER — Telehealth: Payer: Self-pay | Admitting: Internal Medicine

## 2024-09-04 MED ORDER — AMBULATORY NON FORMULARY MEDICATION: 10.0000 mg | Freq: Three times a day (TID) | 3 refills | Status: DC

## 2024-09-04 NOTE — Telephone Encounter (Signed)
 Inbound call from patient requesting to discuss her medication. Patient would like a call back. Please advise.

## 2024-09-04 NOTE — Telephone Encounter (Signed)
 Prescription printed and faxed [facility].

## 2024-09-04 NOTE — Telephone Encounter (Signed)
 Left message for patient to return my call.

## 2024-09-04 NOTE — Telephone Encounter (Signed)
 Patient states she stopped taking metoclopramide  last Wednesday due to side effects. Patient reports she was having difficulty breathing, trouble speaking, irregular heartbeat, fatigue, increase blood pressure, loss of appetite, nausea, stomach pain, and weakness of arms and legs. Patient states she got a call from the [facility] regarding Domperidone. They informed her that she can ship her the medication now but it will cost her double in price. Patient states she is willing to pay for it but needs a new prescription sent to [facility] pharmacy. Dr. Albertus, can I send a new prescription of Domperidone to [facility]?

## 2024-09-04 NOTE — Telephone Encounter (Signed)
 Yes, at previously tolerated dose

## 2024-09-07 MED ORDER — AMBULATORY NON FORMULARY MEDICATION: 10.0000 mg | Freq: Three times a day (TID) | 3 refills | Status: AC

## 2024-09-07 NOTE — Telephone Encounter (Signed)
 Faxed the prescription multiple times to the fax number provided by [facility]. Called [facility] at [phone] and spoke to a pharmacist that took a verbal order for Domperidone. Informed patient they will reach out to her for shipment. Patient verbalized understanding.

## 2024-09-07 NOTE — Addendum Note (Signed)
 Addended by: MADAN, Kenia Teagarden L on: 09/07/2024 12:29 PM   Modules accepted: Orders

## 2024-09-07 NOTE — Telephone Encounter (Signed)
 Patient called stating pharmacy stated they have not received medication. Provided the phone number [phone]. Please advise, thank you

## 2024-11-13 ENCOUNTER — Encounter: Payer: Self-pay | Admitting: Family Medicine

## 2024-11-13 ENCOUNTER — Ambulatory Visit: Payer: Self-pay

## 2024-11-13 NOTE — Telephone Encounter (Signed)
Message sent to Dr Fry for advise 

## 2024-11-13 NOTE — Telephone Encounter (Signed)
 FYI Only or Action Required?: FYI only for provider: No sdv available proceeding to urgent care.  Patient was last seen in primary care on 08/15/2024 by Johnny Garnette LABOR, MD.  Called Nurse Triage reporting Edema.  Symptoms began 2 days ago.  Interventions attempted: Rest, hydration, or home remedies and Other: compression stocking.  Symptoms are: gradually worsening.  Triage Disposition: See HCP Within 4 Hours (Or PCP Triage)  Patient/caregiver understands and will follow disposition?: Yes  Reason for Disposition  [1] Thigh, calf, or ankle swelling AND [2] bilateral AND [3] 1 side is more swollen  Answer Assessment - Initial Assessment Questions No SDV available, urgent care advised, patient in agreement.  Patient aware of ED/911 precautions.   1. LOCATION: Which ankle is swollen? Where is the swelling?     Left foot 2. ONSET: When did the swelling start?     Two days ago 3. SWELLING: How bad is the swelling? Or, How large is it? (e.g., mild, moderate, severe; size of localized swelling)      Improves at night. Foot is double the size of right foot currently. No temperature or color change.  4. PAIN: Is there any pain? If Yes, ask: How bad is it? (Scale 0-10; or none, mild, moderate, severe)     Not disclosed  5. CAUSE: What do you think caused the ankle swelling?     unsure 6. OTHER SYMPTOMS: Do you have any other symptoms? (e.g., fever, chest pain, difficulty breathing, calf pain)     Non traumatic bruising on arm X 2. Non productive cough-states it is from acid reflux and she is on medicine-cough not any different than normal.  Protocols used: Ankle Swelling-A-AH Copied from CRM [id number]. Topic: Clinical - Red Word Triage >> Nov 13, 2024 12:57 PM Drema MATSU wrote: Red Word that prompted transfer to Nurse Triage: Pt has severe swelling/tingling/numbness in left foot and bruises keep popping up on her arm. She stated that her left foot is twice the size as her  right.

## 2024-11-14 NOTE — Telephone Encounter (Signed)
 Yes she should start on these medications, but set up an in person OV with me later this week

## 2024-11-20 ENCOUNTER — Ambulatory Visit: Admitting: Family Medicine

## 2024-11-20 ENCOUNTER — Encounter: Payer: Self-pay | Admitting: Family Medicine

## 2024-11-20 VITALS — BP 136/78 | HR 69 | Temp 98.7°F | Wt 170.0 lb

## 2024-11-20 DIAGNOSIS — M109 Gout, unspecified: Secondary | ICD-10-CM | POA: Diagnosis not present

## 2024-11-20 DIAGNOSIS — I10 Essential (primary) hypertension: Secondary | ICD-10-CM

## 2024-11-20 DIAGNOSIS — R609 Edema, unspecified: Secondary | ICD-10-CM | POA: Diagnosis not present

## 2024-11-20 DIAGNOSIS — R739 Hyperglycemia, unspecified: Secondary | ICD-10-CM

## 2024-11-20 DIAGNOSIS — J40 Bronchitis, not specified as acute or chronic: Secondary | ICD-10-CM | POA: Diagnosis not present

## 2024-11-20 MED ORDER — LEVOFLOXACIN 500 MG PO TABS: 500.0000 mg | ORAL_TABLET | Freq: Every day | ORAL | 0 refills | Status: AC

## 2024-11-20 MED ORDER — HYDROCODONE BIT-HOMATROP MBR 5-1.5 MG/5ML PO SOLN: 5.0000 mL | ORAL | 0 refills | Status: AC | PRN

## 2024-11-20 MED ORDER — HYDROCHLOROTHIAZIDE 25 MG PO TABS: 25.0000 mg | ORAL_TABLET | Freq: Every day | ORAL | 3 refills | Status: AC

## 2024-11-20 NOTE — Progress Notes (Signed)
" ° °  Subjective:    Patient ID: Audrey Brown, female    DOB: [birth year], 79 y.o.   MRN: [id number]  HPI Here with her daughter to follow up on an urgent care visit on 11-13-24 and for other issues. About 2 and 1/2 weeks ago she noticed both ankles were beginning to swell, the left more than the right. At the same time her BP started going up. She had been averaging systolic readings at home in the 130's, but they were going up into the 150's. No SOB. Then the left suddenly became painful. At the urgent care her initial BP was 205/105. Her exam was normal other than mild swelling in the right foot and ankle as well as more pronounced swelling in the left foot and ankle. The left ankle was also quite tender. No warmth or redness. They felt she had s ome type of inflammatory arthritis in the ankle so they gave her 5 days of Prednisone  20 mg BID. They also added hydrochlorothiazide  12.5 mg daily to her usual Losartan  100 mg daily. Since then her BP has been coming down, and her systolic readings are in the 140's. The ankle swelling has decreased and she has less pain. In addition one week ago she developed chest congestion and a dry cough. She says it just like her usual bronchitis. No fever or SOB.    Review of Systems     Objective:   Physical Exam Constitutional:      Appearance: She is not ill-appearing.     Comments: In her wheelchair   HENT:     Nose: Nose normal.     Mouth/Throat:     Pharynx: Oropharynx is clear.  Eyes:     Conjunctiva/sclera: Conjunctivae normal.  Cardiovascular:     Rate and Rhythm: Normal rate and regular rhythm.     Pulses: Normal pulses.     Heart sounds: Normal heart sounds.  Pulmonary:     Effort: Pulmonary effort is normal.     Breath sounds: Normal breath sounds.  Musculoskeletal:     Comments: 1+ edema in the right ankle and 2+ edema in the left ankle. The left ankle is also tender. No warmth or erythema   Lymphadenopathy:     Cervical: No cervical  adenopathy.  Neurological:     Mental Status: She is alert.           Assessment & Plan:  Her HTN has been poorly controlled, so I agree with the addition of hydrochlorothiazide  to the Losartan . However I will increase the dose of this to 25 mg daily. We will get labs today to check her renal function among other things. She is likely having a gout attack in the left ankle, and the Prednisone  has been helping this. We will check a uric acid level today. She also has a bronchitis, and we will treat this with 10 days of Levaquin .  Garnette Olmsted, MD   "

## 2024-11-21 ENCOUNTER — Ambulatory Visit: Payer: Self-pay | Admitting: Family Medicine

## 2024-11-21 LAB — CBC WITH DIFFERENTIAL/PLATELET
Basophils Absolute: 0.1 K/uL (ref 0.0–0.1)
Basophils Relative: 1.2 % (ref 0.0–3.0)
Eosinophils Absolute: 0.2 K/uL (ref 0.0–0.7)
Eosinophils Relative: 1.6 % (ref 0.0–5.0)
HCT: 40.1 % (ref 36.0–46.0)
Hemoglobin: 13.4 g/dL (ref 12.0–15.0)
Lymphocytes Relative: 36.1 % (ref 12.0–46.0)
Lymphs Abs: 3.8 K/uL (ref 0.7–4.0)
MCHC: 33.4 g/dL (ref 30.0–36.0)
MCV: 90.6 fl (ref 78.0–100.0)
Monocytes Absolute: 1 K/uL (ref 0.1–1.0)
Monocytes Relative: 9.8 % (ref 3.0–12.0)
Neutro Abs: 5.4 K/uL (ref 1.4–7.7)
Neutrophils Relative %: 51.3 % (ref 43.0–77.0)
Platelets: 442 K/uL — ABNORMAL HIGH (ref 150.0–400.0)
RBC: 4.43 Mil/uL (ref 3.87–5.11)
RDW: 13.8 % (ref 11.5–15.5)
WBC: 10.4 K/uL (ref 4.0–10.5)

## 2024-11-21 LAB — HEPATIC FUNCTION PANEL
ALT: 15 U/L (ref 3–35)
AST: 14 U/L (ref 5–37)
Albumin: 4.1 g/dL (ref 3.5–5.2)
Alkaline Phosphatase: 59 U/L (ref 39–117)
Bilirubin, Direct: 0.1 mg/dL (ref 0.1–0.3)
Total Bilirubin: 0.4 mg/dL (ref 0.2–1.2)
Total Protein: 7.1 g/dL (ref 6.0–8.3)

## 2024-11-21 LAB — BASIC METABOLIC PANEL WITH GFR
BUN: 29 mg/dL — ABNORMAL HIGH (ref 6–23)
CO2: 29 meq/L (ref 19–32)
Calcium: 9.3 mg/dL (ref 8.4–10.5)
Chloride: 101 meq/L (ref 96–112)
Creatinine, Ser: 0.97 mg/dL (ref 0.40–1.20)
GFR: 56.84 mL/min — ABNORMAL LOW
Glucose, Bld: 112 mg/dL — ABNORMAL HIGH (ref 70–99)
Potassium: 3.8 meq/L (ref 3.5–5.1)
Sodium: 139 meq/L (ref 135–145)

## 2024-11-21 LAB — HEMOGLOBIN A1C: Hgb A1c MFr Bld: 5.4 % (ref 4.6–6.5)

## 2024-11-21 LAB — TSH: TSH: 2.34 u[IU]/mL (ref 0.35–5.50)

## 2024-11-21 LAB — URIC ACID: Uric Acid, Serum: 5.3 mg/dL (ref 2.4–7.0)

## 2025-04-04 ENCOUNTER — Ambulatory Visit
